# Patient Record
Sex: Male | Born: 1940
Health system: Southern US, Community
[De-identification: ages and names within clinical notes are randomized; demographics above are authoritative.]

## PROBLEM LIST (undated history)

## (undated) DIAGNOSIS — I10 Essential (primary) hypertension: Secondary | ICD-10-CM

## (undated) DIAGNOSIS — E785 Hyperlipidemia, unspecified: Secondary | ICD-10-CM

## (undated) DIAGNOSIS — Z7739 Contact with and (suspected) exposure to other war theater: Secondary | ICD-10-CM

## (undated) DIAGNOSIS — G4730 Sleep apnea, unspecified: Secondary | ICD-10-CM

## (undated) DIAGNOSIS — G709 Myoneural disorder, unspecified: Secondary | ICD-10-CM

## (undated) DIAGNOSIS — N400 Enlarged prostate without lower urinary tract symptoms: Secondary | ICD-10-CM

## (undated) DIAGNOSIS — K5730 Diverticulosis of large intestine without perforation or abscess without bleeding: Secondary | ICD-10-CM

## (undated) DIAGNOSIS — E119 Type 2 diabetes mellitus without complications: Secondary | ICD-10-CM

## (undated) DIAGNOSIS — Z77098 Contact with and (suspected) exposure to other hazardous, chiefly nonmedicinal, chemicals: Secondary | ICD-10-CM

## (undated) DIAGNOSIS — H269 Unspecified cataract: Secondary | ICD-10-CM

## (undated) DIAGNOSIS — M1990 Unspecified osteoarthritis, unspecified site: Secondary | ICD-10-CM

## (undated) HISTORY — DX: Contact with and (suspected) exposure to other war theater: Z77.39

## (undated) HISTORY — DX: Essential (primary) hypertension: I10

## (undated) HISTORY — PX: TOOTH EXTRACTION: SUR596

## (undated) HISTORY — DX: Unspecified osteoarthritis, unspecified site: M19.90

## (undated) HISTORY — DX: Benign prostatic hyperplasia without lower urinary tract symptoms: N40.0

## (undated) HISTORY — DX: Hyperlipidemia, unspecified: E78.5

## (undated) HISTORY — DX: Diverticulosis of large intestine without perforation or abscess without bleeding: K57.30

## (undated) HISTORY — DX: Contact with and (suspected) exposure to other hazardous, chiefly nonmedicinal, chemicals: Z77.098

## (undated) HISTORY — DX: Sleep apnea, unspecified: G47.30

## (undated) HISTORY — DX: Unspecified cataract: H26.9

## (undated) HISTORY — DX: Type 2 diabetes mellitus without complications: E11.9

## (undated) HISTORY — DX: Myoneural disorder, unspecified: G70.9

---

## 2004-05-03 LAB — HM COLONOSCOPY: HM Colonoscopy: ABNORMAL

## 2004-05-23 ENCOUNTER — Ambulatory Visit: Payer: Self-pay | Admitting: Internal Medicine

## 2004-09-09 ENCOUNTER — Ambulatory Visit: Payer: Self-pay | Admitting: Internal Medicine

## 2004-11-21 ENCOUNTER — Ambulatory Visit: Payer: Self-pay | Admitting: Internal Medicine

## 2004-11-21 LAB — CONVERTED CEMR LAB: PSA: 0.57 ng/mL

## 2005-05-24 ENCOUNTER — Ambulatory Visit: Payer: Self-pay | Admitting: Internal Medicine

## 2005-11-21 ENCOUNTER — Ambulatory Visit: Payer: Self-pay | Admitting: Internal Medicine

## 2006-05-25 ENCOUNTER — Ambulatory Visit: Payer: Self-pay | Admitting: Internal Medicine

## 2006-05-25 LAB — CONVERTED CEMR LAB
ALT: 49 units/L — ABNORMAL HIGH (ref 0–40)
Albumin: 3.9 g/dL (ref 3.5–5.2)
BUN: 10 mg/dL (ref 6–23)
CO2: 32 meq/L (ref 19–32)
Calcium: 9.2 mg/dL (ref 8.4–10.5)
Chloride: 102 meq/L (ref 96–112)
Cholesterol: 138 mg/dL (ref 0–200)
Creatinine, Ser: 0.9 mg/dL (ref 0.4–1.5)
Direct LDL: 74.4 mg/dL
GFR calc Af Amer: 109 mL/min
GFR calc non Af Amer: 90 mL/min
Glucose, Bld: 201 mg/dL — ABNORMAL HIGH (ref 70–99)
HDL: 31.5 mg/dL — ABNORMAL LOW (ref >39.0)
Hgb A1c MFr Bld: 9 % — ABNORMAL HIGH (ref 4.6–6.0)
Phosphorus: 3.7 mg/dL (ref 2.3–4.6)
Potassium: 4.4 meq/L (ref 3.5–5.1)
Sodium: 140 meq/L (ref 135–145)
Total CHOL/HDL Ratio: 4.4
Triglycerides: 208 mg/dL (ref 0–149)
VLDL: 42 mg/dL — ABNORMAL HIGH (ref 0–40)

## 2006-06-28 ENCOUNTER — Ambulatory Visit: Payer: Self-pay | Admitting: Internal Medicine

## 2006-09-17 ENCOUNTER — Encounter: Payer: Self-pay | Admitting: Internal Medicine

## 2006-09-17 DIAGNOSIS — K5730 Diverticulosis of large intestine without perforation or abscess without bleeding: Secondary | ICD-10-CM | POA: Insufficient documentation

## 2006-09-17 DIAGNOSIS — E785 Hyperlipidemia, unspecified: Secondary | ICD-10-CM | POA: Insufficient documentation

## 2006-09-17 DIAGNOSIS — H409 Unspecified glaucoma: Secondary | ICD-10-CM | POA: Insufficient documentation

## 2006-09-17 DIAGNOSIS — J309 Allergic rhinitis, unspecified: Secondary | ICD-10-CM | POA: Insufficient documentation

## 2006-09-20 ENCOUNTER — Ambulatory Visit: Payer: Self-pay | Admitting: Internal Medicine

## 2006-09-20 DIAGNOSIS — M159 Polyosteoarthritis, unspecified: Secondary | ICD-10-CM | POA: Insufficient documentation

## 2006-09-20 LAB — CONVERTED CEMR LAB: Hgb A1c MFr Bld: 6.6 % — ABNORMAL HIGH (ref 4.6–6.0)

## 2007-01-23 ENCOUNTER — Ambulatory Visit: Payer: Self-pay | Admitting: Internal Medicine

## 2007-01-23 LAB — CONVERTED CEMR LAB
Bilirubin Urine: NEGATIVE
Blood in Urine, dipstick: NEGATIVE
Glucose, Urine, Semiquant: NEGATIVE
Ketones, urine, test strip: NEGATIVE
Nitrite: NEGATIVE
Protein, U semiquant: NEGATIVE
Specific Gravity, Urine: <1.005
Urobilinogen, UA: 0.2
WBC Urine, dipstick: NEGATIVE
pH: 5

## 2007-02-12 ENCOUNTER — Telehealth (INDEPENDENT_AMBULATORY_CARE_PROVIDER_SITE_OTHER): Payer: Self-pay | Admitting: *Deleted

## 2007-03-10 ENCOUNTER — Encounter: Payer: Self-pay | Admitting: Internal Medicine

## 2007-03-21 ENCOUNTER — Ambulatory Visit: Payer: Self-pay | Admitting: Internal Medicine

## 2007-09-24 ENCOUNTER — Ambulatory Visit: Payer: Self-pay | Admitting: Internal Medicine

## 2007-09-25 LAB — CONVERTED CEMR LAB
ALT: 43 units/L (ref 0–53)
AST: 49 units/L — ABNORMAL HIGH (ref 0–37)
Albumin: 4 g/dL (ref 3.5–5.2)
Alkaline Phosphatase: 31 units/L — ABNORMAL LOW (ref 39–117)
BUN: 16 mg/dL (ref 6–23)
Basophils Absolute: 0 10*3/uL (ref 0.0–0.1)
Basophils Relative: 0.8 % (ref 0.0–1.0)
Bilirubin, Direct: 0.1 mg/dL (ref 0.0–0.3)
CO2: 30 meq/L (ref 19–32)
Calcium: 9.2 mg/dL (ref 8.4–10.5)
Chloride: 104 meq/L (ref 96–112)
Cholesterol: 141 mg/dL (ref 0–200)
Creatinine, Ser: 1.1 mg/dL (ref 0.4–1.5)
Eosinophils Absolute: 0.2 10*3/uL (ref 0.0–0.7)
Eosinophils Relative: 4 % (ref 0.0–5.0)
GFR calc Af Amer: 86 mL/min
GFR calc non Af Amer: 71 mL/min
Glucose, Bld: 76 mg/dL (ref 70–99)
HCT: 42.2 % (ref 39.0–52.0)
HDL: 31.2 mg/dL — ABNORMAL LOW (ref >39.0)
Hemoglobin: 14.1 g/dL (ref 13.0–17.0)
Hgb A1c MFr Bld: 6.6 % — ABNORMAL HIGH (ref 4.6–6.0)
LDL Cholesterol: 78 mg/dL (ref 0–99)
Lymphocytes Relative: 50.1 % — ABNORMAL HIGH (ref 12.0–46.0)
MCHC: 33.3 g/dL (ref 30.0–36.0)
MCV: 86.9 fL (ref 78.0–100.0)
Monocytes Absolute: 0.2 10*3/uL (ref 0.1–1.0)
Monocytes Relative: 6.2 % (ref 3.0–12.0)
Neutro Abs: 1.5 10*3/uL (ref 1.4–7.7)
Neutrophils Relative %: 38.9 % — ABNORMAL LOW (ref 43.0–77.0)
Phosphorus: 3.5 mg/dL (ref 2.3–4.6)
Platelets: 202 10*3/uL (ref 150–400)
Potassium: 4.4 meq/L (ref 3.5–5.1)
RBC: 4.86 M/uL (ref 4.22–5.81)
RDW: 12.8 % (ref 11.5–14.6)
Sodium: 140 meq/L (ref 135–145)
TSH: 1.48 microintl units/mL (ref 0.35–5.50)
Total Bilirubin: 0.7 mg/dL (ref 0.3–1.2)
Total CHOL/HDL Ratio: 4.5
Total Protein: 7 g/dL (ref 6.0–8.3)
Triglycerides: 161 mg/dL — ABNORMAL HIGH (ref 0–149)
VLDL: 32 mg/dL (ref 0–40)
WBC: 3.8 10*3/uL — ABNORMAL LOW (ref 4.5–10.5)

## 2007-12-18 ENCOUNTER — Ambulatory Visit: Payer: Self-pay | Admitting: Family Medicine

## 2007-12-18 DIAGNOSIS — M5430 Sciatica, unspecified side: Secondary | ICD-10-CM | POA: Insufficient documentation

## 2008-02-25 ENCOUNTER — Encounter: Payer: Self-pay | Admitting: Internal Medicine

## 2008-04-08 ENCOUNTER — Ambulatory Visit: Payer: Self-pay | Admitting: Internal Medicine

## 2008-05-21 ENCOUNTER — Encounter (INDEPENDENT_AMBULATORY_CARE_PROVIDER_SITE_OTHER): Payer: Self-pay | Admitting: *Deleted

## 2008-10-13 ENCOUNTER — Ambulatory Visit: Payer: Self-pay | Admitting: Internal Medicine

## 2008-10-14 LAB — CONVERTED CEMR LAB
ALT: 48 units/L (ref 0–53)
AST: 58 units/L — ABNORMAL HIGH (ref 0–37)
Albumin: 4.1 g/dL (ref 3.5–5.2)
Alkaline Phosphatase: 26 units/L — ABNORMAL LOW (ref 39–117)
BUN: 18 mg/dL (ref 6–23)
Basophils Absolute: 0 10*3/uL (ref 0.0–0.1)
Basophils Relative: 1.2 % (ref 0.0–3.0)
Bilirubin, Direct: 0.1 mg/dL (ref 0.0–0.3)
CO2: 30 meq/L (ref 19–32)
Calcium: 8.9 mg/dL (ref 8.4–10.5)
Chloride: 102 meq/L (ref 96–112)
Cholesterol: 143 mg/dL (ref 0–200)
Creatinine, Ser: 1 mg/dL (ref 0.4–1.5)
Eosinophils Absolute: 0.2 10*3/uL (ref 0.0–0.7)
Eosinophils Relative: 4.9 % (ref 0.0–5.0)
Glucose, Bld: 64 mg/dL — ABNORMAL LOW (ref 70–99)
HCT: 40.7 % (ref 39.0–52.0)
HDL: 34.5 mg/dL — ABNORMAL LOW (ref >39.00)
Hemoglobin: 13.8 g/dL (ref 13.0–17.0)
Hgb A1c MFr Bld: 7 % — ABNORMAL HIGH (ref 4.6–6.5)
LDL Cholesterol: 71 mg/dL (ref 0–99)
Lymphocytes Relative: 54.2 % — ABNORMAL HIGH (ref 12.0–46.0)
Lymphs Abs: 2.1 10*3/uL (ref 0.7–4.0)
MCHC: 33.8 g/dL (ref 30.0–36.0)
MCV: 87 fL (ref 78.0–100.0)
Monocytes Absolute: 0.3 10*3/uL (ref 0.1–1.0)
Monocytes Relative: 9 % (ref 3.0–12.0)
Neutro Abs: 1.2 10*3/uL — ABNORMAL LOW (ref 1.4–7.7)
Neutrophils Relative %: 30.7 % — ABNORMAL LOW (ref 43.0–77.0)
Phosphorus: 3.7 mg/dL (ref 2.3–4.6)
Platelets: 196 10*3/uL (ref 150.0–400.0)
Potassium: 4.5 meq/L (ref 3.5–5.1)
RBC: 4.68 M/uL (ref 4.22–5.81)
RDW: 13.1 % (ref 11.5–14.6)
Sodium: 141 meq/L (ref 135–145)
TSH: 1.65 microintl units/mL (ref 0.35–5.50)
Total Bilirubin: 1 mg/dL (ref 0.3–1.2)
Total CHOL/HDL Ratio: 4
Total Protein: 7.2 g/dL (ref 6.0–8.3)
Triglycerides: 189 mg/dL — ABNORMAL HIGH (ref 0.0–149.0)
VLDL: 37.8 mg/dL (ref 0.0–40.0)
WBC: 3.8 10*3/uL — ABNORMAL LOW (ref 4.5–10.5)

## 2009-01-11 ENCOUNTER — Ambulatory Visit: Payer: Self-pay | Admitting: Internal Medicine

## 2009-01-11 ENCOUNTER — Telehealth: Payer: Self-pay | Admitting: Internal Medicine

## 2009-01-11 DIAGNOSIS — R519 Headache, unspecified: Secondary | ICD-10-CM | POA: Insufficient documentation

## 2009-01-11 DIAGNOSIS — R51 Headache: Secondary | ICD-10-CM | POA: Insufficient documentation

## 2009-03-02 ENCOUNTER — Encounter: Payer: Self-pay | Admitting: Internal Medicine

## 2009-05-03 ENCOUNTER — Ambulatory Visit: Payer: Self-pay | Admitting: Internal Medicine

## 2009-11-03 ENCOUNTER — Ambulatory Visit: Payer: Self-pay | Admitting: Internal Medicine

## 2009-11-04 LAB — CONVERTED CEMR LAB
ALT: 45 units/L (ref 0–53)
AST: 52 units/L — ABNORMAL HIGH (ref 0–37)
Albumin: 4.4 g/dL (ref 3.5–5.2)
Alkaline Phosphatase: 36 units/L — ABNORMAL LOW (ref 39–117)
BUN: 23 mg/dL (ref 6–23)
Basophils Absolute: 0 10*3/uL (ref 0.0–0.1)
Basophils Relative: 0.6 % (ref 0.0–3.0)
Bilirubin, Direct: 0.1 mg/dL (ref 0.0–0.3)
CO2: 28 meq/L (ref 19–32)
Calcium: 9.1 mg/dL (ref 8.4–10.5)
Chloride: 98 meq/L (ref 96–112)
Cholesterol: 153 mg/dL (ref 0–200)
Creatinine, Ser: 1.2 mg/dL (ref 0.4–1.5)
Direct LDL: 80.3 mg/dL
Eosinophils Absolute: 0.3 10*3/uL (ref 0.0–0.7)
Eosinophils Relative: 5.9 % — ABNORMAL HIGH (ref 0.0–5.0)
GFR calc non Af Amer: 75.29 mL/min (ref >60)
Glucose, Bld: 133 mg/dL — ABNORMAL HIGH (ref 70–99)
HCT: 43.3 % (ref 39.0–52.0)
HDL: 32.3 mg/dL — ABNORMAL LOW (ref >39.00)
Hemoglobin: 14.6 g/dL (ref 13.0–17.0)
Hgb A1c MFr Bld: 7.2 % — ABNORMAL HIGH (ref 4.6–6.5)
Lymphocytes Relative: 48.5 % — ABNORMAL HIGH (ref 12.0–46.0)
Lymphs Abs: 2.1 10*3/uL (ref 0.7–4.0)
MCHC: 33.7 g/dL (ref 30.0–36.0)
MCV: 87.3 fL (ref 78.0–100.0)
Monocytes Absolute: 0.3 10*3/uL (ref 0.1–1.0)
Monocytes Relative: 7.2 % (ref 3.0–12.0)
Neutro Abs: 1.6 10*3/uL (ref 1.4–7.7)
Neutrophils Relative %: 37.8 % — ABNORMAL LOW (ref 43.0–77.0)
PSA: 0.53 ng/mL (ref 0.10–4.00)
Phosphorus: 3.9 mg/dL (ref 2.3–4.6)
Platelets: 211 10*3/uL (ref 150.0–400.0)
Potassium: 5.3 meq/L — ABNORMAL HIGH (ref 3.5–5.1)
RBC: 4.97 M/uL (ref 4.22–5.81)
RDW: 14.1 % (ref 11.5–14.6)
Sodium: 137 meq/L (ref 135–145)
TSH: 2.15 microintl units/mL (ref 0.35–5.50)
Total Bilirubin: 0.6 mg/dL (ref 0.3–1.2)
Total CHOL/HDL Ratio: 5
Total Protein: 7.2 g/dL (ref 6.0–8.3)
Triglycerides: 242 mg/dL — ABNORMAL HIGH (ref 0.0–149.0)
VLDL: 48.4 mg/dL — ABNORMAL HIGH (ref 0.0–40.0)
WBC: 4.2 10*3/uL — ABNORMAL LOW (ref 4.5–10.5)

## 2010-01-12 ENCOUNTER — Encounter: Payer: Self-pay | Admitting: Internal Medicine

## 2010-01-12 LAB — HM DIABETES EYE EXAM

## 2010-03-28 ENCOUNTER — Ambulatory Visit: Payer: Self-pay | Admitting: Family Medicine

## 2010-04-26 LAB — CONVERTED CEMR LAB: Hgb A1c MFr Bld: 7.3 %

## 2010-04-27 ENCOUNTER — Encounter: Payer: Self-pay | Admitting: Internal Medicine

## 2010-05-06 ENCOUNTER — Other Ambulatory Visit: Payer: Self-pay | Admitting: Internal Medicine

## 2010-05-06 ENCOUNTER — Ambulatory Visit
Admission: RE | Admit: 2010-05-06 | Discharge: 2010-05-06 | Payer: Self-pay | Source: Home / Self Care | Attending: Internal Medicine | Admitting: Internal Medicine

## 2010-05-06 LAB — HEPATIC FUNCTION PANEL
ALT: 50 U/L (ref 0–53)
AST: 52 U/L — ABNORMAL HIGH (ref 0–37)
Albumin: 4.3 g/dL (ref 3.5–5.2)
Alkaline Phosphatase: 36 U/L — ABNORMAL LOW (ref 39–117)
Bilirubin, Direct: 0.1 mg/dL (ref 0.0–0.3)
Total Bilirubin: 0.7 mg/dL (ref 0.3–1.2)
Total Protein: 7.2 g/dL (ref 6.0–8.3)

## 2010-05-06 LAB — LIPID PANEL
Cholesterol: 198 mg/dL (ref 0–200)
HDL: 35.1 mg/dL — ABNORMAL LOW (ref >39.00)
Total CHOL/HDL Ratio: 6
Triglycerides: 252 mg/dL — ABNORMAL HIGH (ref 0.0–149.0)
VLDL: 50.4 mg/dL — ABNORMAL HIGH (ref 0.0–40.0)

## 2010-05-06 LAB — LDL CHOLESTEROL, DIRECT: Direct LDL: 121.2 mg/dL

## 2010-05-06 LAB — HM DIABETES FOOT EXAM

## 2010-05-10 NOTE — Assessment & Plan Note (Signed)
Summary: L LEG PAIN/CLE   Vital Signs:  Patient Profile:   70 Years Old Male Weight:      235.38 pounds (106.99 kg) Temp:     98.4 degrees F (36.89 degrees C) oral Pulse rate:   88 / minute Pulse rhythm:   regular BP sitting:   120 / 90  (left arm) Cuff size:   regular  Vitals Entered By: Silas Sacramento (December 18, 2007 2:22 PM)                 Chief Complaint:  Left leg pain.  History of Present Illness: the patient is a very pleasant 70 year old African American gentleman who presents with a long history of left-sided radicular pain on the left side of his gluteal region radiating down the posterior aspect of his leg to the knee.He has tried some anti-inflammatories without significant success. He is currently doing Pilates and lift weights one time weekly. He denies any trauma. Denies any incontinence or saddle anesthesia.  Patient of Dr. Alphonsus Sias, now with pain at night in the posterior aspect of L leg. Progressively has gotten worse. Bothers most when sleeping on the stomache. Initially was in both legs, now only in the l side. Lost weight. Lost about 20 pounds and walking 5 days a week.   BP and sugars doing well.       Current Allergies: No known allergies   Past Medical History:    Reviewed history from 09/20/2006 and no changes required:       Allergic rhinitis       Diabetes mellitus, type II       Diverticulosis, colon       Hyperlipidemia       Hypertension       Glaucoma       Osteoarthritis  Past Surgical History:    Reviewed history from 09/17/2006 and no changes required:       [place] vet- 20% disability from shrapnel wounds 1967 and  20% agent orange       Stress test negative 01/08   Social History:    Reviewed history from 09/17/2006 and no changes required:       Marital Status: Married       Children: Widowed 1996, remarried 06/03       Occupation: Retired [employer]       Never Smoked       Rare alcohol    Review of Systems  General  Denies chills, fever, loss of appetite, and weight loss.      the patient has been attempting to lose weight, and is lost 20 pounds  MS      Complains of joint pain and stiffness.      Denies joint redness, joint swelling, loss of strength, low back pain, mid back pain, muscle aches, muscle, and muscle weakness.  Neuro      Denies falling down, numbness, poor balance, and tingling.   Physical Exam  General:     Well-developed,well-nourished,in no acute distress; alert,appropriate and cooperative throughout examination Head:     normocephalic and atraumatic.   Ears:     no external deformities.   Nose:     no external deformity.   Lungs:     normal respiratory effort.     Detailed Back/Spine Exam  Lumbosacral Exam:  Inspection-deformity:    Normal Palpation-spinal tenderness:  Normal Range of Motion:    Forward Flexion:   60 degrees    Hyperextension:   25  degrees    Schober's:        >6 cm    Right Lateral Bend:   25 degrees    Left Lateral Bend:   25 degrees Squatting:  normal Lying Straight Leg Raise:    Right:  negative    Left:  negative Sitting Straight Leg Raise:    Right:  negative    Left:  negative Reverse Straight Leg Raise:    Right:  negative    Left:  negative Contralateral Straight Leg Raise:    Right:  negative    Left:  negative Sciatic Notch:    There is no sciatic notch tenderness. Toe Walking:    Right:  normal    Left:  normal Heel Walking:    Right:  normal    Left:  normal Patrick's Maneuver:    Right:  negative    Left:  negative Fabere Test:    Right:  negative    Left:  negative     reverse Patrick's test on the left and right cause some low back discomfort. Nontender directly on his performance. Negative logroll Nontender greater trochanters Excellent range of motion bilateral hips, internal, external, abduction    Impression & Recommendations:  Problem # 1:  SCIATICA, LEFT (ICD-724.3) Assessment: New The patient  was given a handout from Dr. Ailene Ards book "The Sports Medicine Patient Advisor" describing the anatomy and rehabilitation of the following condition: Sciatica  >25 minutes spent in total face to face time with the patient with >50% of time spent in counselling and coordination of care and review of rehab  clinically not consistent with acute discogenic origin  increase pelvic ROM Core stabilization  Cont Pilates encouraged weight loss  NSAIDS prn  His updated medication list for this problem includes:    Aspirin 81 Mg Tabs (Aspirin) .Marland Kitchen... Take one by mouth daily   Complete Medication List: 1)  Metformin Hcl 1000 Mg Tabs (Metformin hcl) .... Take 1 tablet by mouth twice a day 2)  Aspirin 81 Mg Tabs (Aspirin) .... Take one by mouth daily 3)  Multivitamins Tabs (Multiple vitamin) .... Take one by mouth daily 4)  Lisinopril 20 Mg Tabs (Lisinopril) .... Take one by mouth daily 5)  Simvastatin 10 Mg Tabs (Simvastatin) .... Take one by mouth daily 6)  Travatan 0.004 % Soln (Travoprost) .... Use one drop in left daily 7)  Claritin Tabs (Loratadine tabs) .... Prn 8)  Glipizide Xl 5 Mg Tb24 (Glipizide) .... Take one by mouth daily 9)  Levitra 20 Mg Tabs (Vardenafil hcl) .... 1/2 -1 before sex as directed    ]

## 2010-05-10 NOTE — Assessment & Plan Note (Signed)
Summary: pain in head/ds   Vital Signs:  Patient profile:   70 year old male Weight:      245 pounds Temp:     98.3 degrees F oral Pulse rate:   76 / minute Pulse rhythm:   regular BP sitting:   120 / 60  (left arm) Cuff size:   regular  Vitals Entered By: Mervin Hack CMA Duncan Dull) (January 11, 2009 5:14 PM)  History of Present Illness: Chief Complaint: pain in head  Yesterday, in bed with wife had taken the levitra 2 nights ago Had taken shower before started wtih sweat and had pain across top of head--severe enough to scare him (during sex) pain occurred at climax  No vision change--no diplopia or unilateral loss No loss of hearing  No chest pain  Sharp pain that seemed to move across his forehead better when he just was lying still--after about 5 minutes checked BP--130/78  Noticed recurrence of pain today Was helping to push  a stuck car off the road    Allergies: No Known Drug Allergies  Past History:  Past medical, surgical, family and social histories (including risk factors) reviewed for relevance to current acute and chronic problems.  Past Medical History: Reviewed history from 10/13/2008 and no changes required. Allergic rhinitis Diabetes mellitus, type II Diverticulosis, colon Hyperlipidemia Hypertension Glaucoma Osteoarthritis  Dr Roxana Hires  Past Surgical History: Reviewed history from 09/17/2006 and no changes required. [place] vet- 20% disability from shrapnel wounds 1967 and  20% agent orange Stress test negative 01/08  Family History: Reviewed history from 09/17/2006 and no changes required. Father: Died age age 67, colon cancer Mother: Died at age 45, CVA, MI Siblings: 9 HTN- several sibs DM- several sibs CAD-- mom, brother  Social History: Reviewed history from 09/17/2006 and no changes required. Marital Status: Married Children: Widowed 1996, remarried 06/03 Occupation: Retired [employer] Never Smoked Rare alcohol  Review  of Systems       No arm or leg weakness No speech or swallowing problems  Physical Exam  General:  alert and normal appearance.   Head:  normocephalic and atraumatic.   No temporal bruits No tenderness Eyes:  pupils equal, pupils round, pupils reactive to light, and no optic disk abnormalities.   Mouth:  no erythema and no lesions.   Neck:  supple, no masses, no thyromegaly, no carotid bruits, and no cervical lymphadenopathy.   Lungs:  normal respiratory effort and normal breath sounds.   Heart:  normal rate, regular rhythm, no murmur, and no gallop.   Neurologic:  alert & oriented X3, cranial nerves II-XII intact, strength normal in all extremities, gait normal, finger-to-nose normal, and Romberg negative.   Psych:  normally interactive, good eye contact, not anxious appearing, and not depressed appearing.     Impression & Recommendations:  Problem # 1:  HEADACHE (ICD-784.0) Assessment New fairly classic description of coital headache first episode doesn't seem related to levitra No neuro findings  P: reassured that this is not something serious    can try sex again but call if recurs    may want to consider headache referral for recurrrence---I don't see this commonly, esp at his age    okay to try levitra again if no problems without  Complete Medication List: 1)  Metformin Hcl 1000 Mg Tabs (Metformin hcl) .... Take 1 tablet by mouth twice a day 2)  Aspirin 81 Mg Tabs (Aspirin) .... Take one by mouth daily 3)  Multivitamins Tabs (Multiple vitamin) .Marland KitchenMarland KitchenMarland Kitchen  Take one by mouth daily 4)  Lisinopril 20 Mg Tabs (Lisinopril) .... Take one by mouth daily 5)  Simvastatin 10 Mg Tabs (Simvastatin) .... Take one by mouth daily 6)  Travatan 0.004 % Soln (Travoprost) .... Use one drop in left daily 7)  Glipizide Xl 5 Mg Tb24 (Glipizide) .... Take one by mouth daily 8)  Levitra 20 Mg Tabs (Vardenafil hcl) .... 1/2 -1 before sex as directed 9)  Flonase 50 Mcg/act Susp (Fluticasone  propionate) .Marland Kitchen.. 1 spray in each nostril at bedtime 10)  Loratadine 10 Mg Tabs (Loratadine) .Marland Kitchen.. 1-2 tabs daily for allergy symptoms  Patient Instructions: 1)  Keep regular appointment  Current Allergies (reviewed today): No known allergies

## 2010-05-10 NOTE — Assessment & Plan Note (Signed)
Summary: FOLLOW UP/EVE   Vital Signs:  Patient Profile:   70 Years Old Male Weight:      242.38 pounds Temp:     99.4 degrees F oral Pulse rate:   80 / minute BP sitting:   108 / 56  (left arm) Cuff size:   large  Vitals Entered By: Wandra Mannan (September 20, 2006 10:18 AM)               Chief Complaint:  3 mo fu.  History of Present Illness: Has been walking 5 days/week---1---1 1/4 hours plus has had to mow lawn more in the spring Watching his diet Weight down 8# more Feels he can maintain this regimen  Sugars have been very good--checks in AM and does see relationship with evening meal and his level.  Generally have been <120  Does note inflammation in R hand joints lately--since being on glipizide Does use computer a lot and uses right hand. Also grips hand control on mower  Recent trip to eye doctor L eye pressure is much better  Current Allergies: No known allergies   Past Medical History:    Reviewed history from 09/17/2006 and no changes required:       Allergic rhinitis       Diabetes mellitus, type II       Diverticulosis, colon       Hyperlipidemia       Hypertension       Glaucoma       Osteoarthritis      Physical Exam  General:     alert and normal appearance.   Msk:     mild swelling of PIP in R 2nd-4th fingers    Impression & Recommendations:  Problem # 1:  DIABETES MELLITUS, TYPE II (ICD-250.00) Assessment: Improved Control seems better with lifestyle changes P: check HgbA1c  His updated medication list for this problem includes:    Metformin Hcl 1000 Mg Tabs (Metformin hcl) .Marland Kitchen... Take 1 tablet by mouth twice a day    Aspirin 81 Mg Tabs (Aspirin) .Marland Kitchen... Take one by mouth daily    Lisinopril 20 Mg Tabs (Lisinopril) .Marland Kitchen... Take one by mouth daily    Glipizide Xl 5 Mg Tb24 (Glipizide) .Marland Kitchen... Take one by mouth daily  Orders: TLB-A1C / Hgb A1C (Glycohemoglobin) (83036-A1C)   Problem # 2:  OSTEOARTHRITIS (ICD-715.90) Assessment:  Deteriorated Notes it more in R finger discussed the use of mouse and grip handles for mower Ibuprofen before mowing  His updated medication list for this problem includes:    Aspirin 81 Mg Tabs (Aspirin) .Marland Kitchen... Take one by mouth daily    Patient Instructions: 1)  Schedule physical in 6 months    Current Allergies: No known allergies  Current Medications (including changes made in today's visit):  METFORMIN HCL 1000 MG TABS (METFORMIN HCL) Take 1 tablet by mouth twice a day ASPIRIN 81 MG TABS (ASPIRIN) Take one by mouth daily MULTIVITAMINS  TABS (MULTIPLE VITAMIN) Take one by mouth daily LISINOPRIL 20 MG TABS (LISINOPRIL) Take one by mouth daily SIMVASTATIN 10 MG TABS (SIMVASTATIN) Take one by mouth daily TRAVATAN 0.004 % SOLN (TRAVOPROST) Use one drop in left daily CLARITIN  TABS (LORATADINE TABS) prn GLIPIZIDE XL 5 MG TB24 (GLIPIZIDE) Take one by mouth daily

## 2010-05-10 NOTE — Assessment & Plan Note (Signed)
Summary: 8:15 APPT 6 MONTH FOLLOW UP/[facility]   Vital Signs:  Patient profile:   70 year old male Height:      70 inches Weight:      242 pounds BMI:     34.85 Temp:     97.8 degrees F oral Pulse rate:   70 / minute Pulse rhythm:   regular BP sitting:   120 / 70  (left arm) Cuff size:   regular  Vitals Entered By: Mervin Hack CMA (October 13, 2008 10:47 AM)  History of Present Illness: Chief Complaint: 6 month follow-up  Doing fine No new problems Sciatica is better Able to walk more lately  AM sugars in 130's No hypoglycemic reactions---can feel it if he skips meals but nothing too much May get a bit drowsy if he eats high glycemic foods  Did feel a knot in his scrotum--not on the testicle  First noted it last week and seems intermittent No pain  No chest pain No SOB No ankle edema no dizziness No headaches  Allergies: No Known Drug Allergies  Past History:  Past medical, surgical, family and social histories (including risk factors) reviewed for relevance to current acute and chronic problems.  Past Medical History: Allergic rhinitis Diabetes mellitus, type II Diverticulosis, colon Hyperlipidemia Hypertension Glaucoma Osteoarthritis  Dr Roxana Hires  Past Surgical History: Reviewed history from 09/17/2006 and no changes required. [place] vet- 20% disability from shrapnel wounds 1967 and  20% agent orange Stress test negative 01/08  Family History: Reviewed history from 09/17/2006 and no changes required. Father: Died age age 13, colon cancer Mother: Died at age 97, CVA, MI Siblings: 9 HTN- several sibs DM- several sibs CAD-- mom, brother  Social History: Reviewed history from 09/17/2006 and no changes required. Marital Status: Married Children: Widowed 1996, remarried 06/03 Occupation: Retired [employer] Never Smoked Rare alcohol  Review of Systems       Now using hearing aid some help with this Now using flonase at night----from ENT weight  is stable Sleeps okay--nocturia in early AM  (5:30AM or so) No sig arthritic problems  Physical Exam  General:  alert and normal appearance.   Neck:  supple, no masses, no thyromegaly, no carotid bruits, and no cervical lymphadenopathy.   Lungs:  normal respiratory effort and normal breath sounds.   Heart:  normal rate, regular rhythm, no murmur, and no gallop.   Abdomen:  soft and non-tender.   Msk:  no joint tenderness and no joint swelling.   Pulses:  1+ in each foot Extremities:  no edema Neurologic:  alert & oriented X3 and strength normal in all extremities.   Skin:  no rashes and no suspicious lesions.   Psych:  normally interactive, good eye contact, not anxious appearing, and not depressed appearing.    Diabetes Management Exam:    Foot Exam (with socks and/or shoes not present):       Sensory-Pinprick/Light touch:          Left medial foot (L-4): normal          Left dorsal foot (L-5): normal          Left lateral foot (S-1): normal          Right medial foot (L-4): normal          Right dorsal foot (L-5): normal          Right lateral foot (S-1): normal       Inspection:  Left foot: abnormal             Comments: slight callous          Right foot: abnormal             Comments: slight callous       Nails:          Left foot: normal          Right foot: normal    Eye Exam:       Eye Exam done elsewhere          Date: 09/08/2008          Results: normal          Done by: Dr Emily Filbert   Impression & Recommendations:  Problem # 1:  DIABETES MELLITUS, TYPE II (ICD-250.00)  last HgbA1c 6.4% will recheck labs  His updated medication list for this problem includes:    Metformin Hcl 1000 Mg Tabs (Metformin hcl) .Marland Kitchen... Take 1 tablet by mouth twice a day    Aspirin 81 Mg Tabs (Aspirin) .Marland Kitchen... Take one by mouth daily    Lisinopril 20 Mg Tabs (Lisinopril) .Marland Kitchen... Take one by mouth daily    Glipizide Xl 5 Mg Tb24 (Glipizide) .Marland Kitchen... Take one by mouth  daily  Orders: TLB-A1C / Hgb A1C (Glycohemoglobin) (83036-A1C)  Problem # 2:  HYPERTENSION (ICD-401.9) Assessment: Unchanged  good control urine microal negative at [place] last time  His updated medication list for this problem includes:    Lisinopril 20 Mg Tabs (Lisinopril) .Marland Kitchen... Take one by mouth daily  BP today: 120/70 Prior BP: 110/60 (04/08/2008)  Labs Reviewed: K+: 4.4 (09/24/2007) Creat: : 1.1 (09/24/2007)   Chol: 141 (09/24/2007)   HDL: 31.2 (09/24/2007)   LDL: 78 (09/24/2007)   TG: 161 (09/24/2007)  Orders: TLB-Renal Function Panel (80069-RENAL) TLB-CBC Platelet - w/Differential (85025-CBCD) TLB-TSH (Thyroid Stimulating Hormone) (84443-TSH)  Problem # 3:  OSTEOARTHRITIS (ICD-715.90) sciatica better more active again  Problem # 4:  HYPERLIPIDEMIA (ICD-272.4) Assessment: Unchanged  no problems with med due for labs  His updated medication list for this problem includes:    Simvastatin 10 Mg Tabs (Simvastatin) .Marland Kitchen... Take one by mouth daily  Labs Reviewed: SGOT: 49 (09/24/2007)   SGPT: 43 (09/24/2007)   HDL:31.2 (09/24/2007), 31.5 (05/25/2006)  LDL:78 (09/24/2007), DEL (54/12/8117)  Chol:141 (09/24/2007), 138 (05/25/2006)  Trig:161 (09/24/2007), 208 (05/25/2006)  Orders: TLB-Lipid Panel (80061-LIPID) TLB-Hepatic/Liver Function Pnl (80076-HEPATIC) Venipuncture (14782)  Problem # 5:  ALLERGIC RHINITIS (ICD-477.9) Assessment: Unchanged okay on current regimen  The following medications were removed from the medication list:    Claritin Tabs (Loratadine tabs) .Marland Kitchen... Prn His updated medication list for this problem includes:    Flonase 50 Mcg/act Susp (Fluticasone propionate) .Marland Kitchen... 1 spray in each nostril at bedtime    Loratadine 10 Mg Tabs (Loratadine) .Marland Kitchen... 1-2 tabs daily for allergy symptoms  Complete Medication List: 1)  Metformin Hcl 1000 Mg Tabs (Metformin hcl) .... Take 1 tablet by mouth twice a day 2)  Aspirin 81 Mg Tabs (Aspirin) .... Take one by mouth  daily 3)  Multivitamins Tabs (Multiple vitamin) .... Take one by mouth daily 4)  Lisinopril 20 Mg Tabs (Lisinopril) .... Take one by mouth daily 5)  Simvastatin 10 Mg Tabs (Simvastatin) .... Take one by mouth daily 6)  Travatan 0.004 % Soln (Travoprost) .... Use one drop in left daily 7)  Glipizide Xl 5 Mg Tb24 (Glipizide) .... Take one by mouth daily 8)  Levitra 20 Mg Tabs (  Vardenafil hcl) .... 1/2 -1 before sex as directed 9)  Flonase 50 Mcg/act Susp (Fluticasone propionate) .Marland Kitchen.. 1 spray in each nostril at bedtime 10)  Loratadine 10 Mg Tabs (Loratadine) .Marland Kitchen.. 1-2 tabs daily for allergy symptoms  Patient Instructions: 1)  Please schedule a follow-up appointment in 6 months .   Current Allergies (reviewed today): No known allergies

## 2010-05-10 NOTE — Letter (Signed)
Summary: Primary Care Appointment Letter  [facility]  [address] [place], [place] [address]   Phone: [phone]  Fax: [phone]    05/21/2008 MRN: [id number]  Lucas [name] [address] [place], [place]  [address]  Dear Lucas [name],   Your Primary Care Physician Cindee Salt MD has indicated that:    _______it is time to schedule an appointment.    _______you missed your appointment on______ and need to call and          reschedule.    _______you need to have lab work done.    _______you need to schedule an appointment discuss lab or test results.    _______you need to call to reschedule your appointment that is                       scheduled on _________.     Please call our office as soon as possible. Our phone number is [phone]          _________. Please press option 1. Our office is open 8a-12noon and 1p-5p, Monday through Friday.     Thank you,    [facility]

## 2010-05-10 NOTE — Assessment & Plan Note (Signed)
Summary: CK UP/HEA   Vital Signs:  Patient Profile:   70 Years Old Male Weight:      245.38 pounds Temp:     98.2 degrees F oral Pulse rate:   76 / minute BP sitting:   118 / 66  (left arm) Cuff size:   large  Vitals Entered By: Wandra Mannan (March 21, 2007 7:53 AM)                 Chief Complaint:  check up  fasting.  History of Present Illness: Has had his follow up at the [place] [facility] work looked good HgbA1c 6.5% PSA 0.5 Chol levels at goal  Has been scheduled for AAA screening with ultrasound also Will be participating in a study about memory issues--he has not had problems though  Did have episode of sharp subcostal pain before eating that lasted a couple of hours Has happened once before about 1 year ago No SOB This happened after his morning walk and he had taken his glipizide  Sugars up a little for Thanksgiving, otherwise okay Did have eyes checked  No foot ulcers--[facility] doctor found slightly decreased sensation in left big toe--he wasn't aware  Current Allergies (reviewed today): No known allergies   Past Medical History:    Reviewed history from 09/20/2006 and no changes required:       Allergic rhinitis       Diabetes mellitus, type II       Diverticulosis, colon       Hyperlipidemia       Hypertension       Glaucoma       Osteoarthritis  Past Surgical History:    Reviewed history from 09/17/2006 and no changes required:       [place] vet- 20% disability from shrapnel wounds 1967 and  20% agent orange       Stress test negative 01/08   Family History:    Reviewed history from 09/17/2006 and no changes required:       Father: Died age age 51, colon cancer       Mother: Died at age 32, CVA, MI       Siblings: 9       HTN- several sibs       DM- several sibs       CAD-- mom, brother  Social History:    Reviewed history from 09/17/2006 and no changes required:       Marital Status: Married       Children: Widowed 1996, remarried 06/03  Occupation: Retired [employer]       Never Smoked       Rare alcohol    Review of Systems  The patient denies chest pain, syncope, dyspnea on exhertion, peripheral edema, abdominal pain, and depression.         Weight is stable Sleeps fairly well Has nocturia x 1    Physical Exam  General:     alert and normal appearance.   Neck:     supple, no masses, no thyromegaly, no carotid bruits, and no cervical lymphadenopathy.   Lungs:     normal respiratory effort and normal breath sounds.   Heart:     normal rate, regular rhythm, no murmur, and no gallop.   Pulses:     1+ in feet Extremities:     no edema Neurologic:     sensation intact to light touch.   Skin:     no ulcerations.   Psych:  normally interactive, good eye contact, not anxious appearing, and not depressed appearing.      Impression & Recommendations:  Problem # 1:  DIABETES MELLITUS, TYPE II (ICD-250.00) Assessment: Unchanged good control  His updated medication list for this problem includes:    Metformin Hcl 1000 Mg Tabs (Metformin hcl) .Marland Kitchen... Take 1 tablet by mouth twice a day    Aspirin 81 Mg Tabs (Aspirin) .Marland Kitchen... Take one by mouth daily    Lisinopril 20 Mg Tabs (Lisinopril) .Marland Kitchen... Take one by mouth daily    Glipizide Xl 5 Mg Tb24 (Glipizide) .Marland Kitchen... Take one by mouth daily   Problem # 2:  HYPERTENSION (ICD-401.9) Assessment: Unchanged at goal No changes needed  His updated medication list for this problem includes:    Lisinopril 20 Mg Tabs (Lisinopril) .Marland Kitchen... Take one by mouth daily   Problem # 3:  HYPERLIPIDEMIA (ICD-272.4) Assessment: Unchanged at goal on Rx  His updated medication list for this problem includes:    Simvastatin 10 Mg Tabs (Simvastatin) .Marland Kitchen... Take one by mouth daily   Problem # 4:  LOW BACK PAIN, ACUTE (ICD-724.2) Assessment: Improved this resolved  His updated medication list for this problem includes:    Aspirin 81 Mg Tabs (Aspirin) .Marland Kitchen... Take one by mouth  daily   Complete Medication List: 1)  Metformin Hcl 1000 Mg Tabs (Metformin hcl) .... Take 1 tablet by mouth twice a day 2)  Aspirin 81 Mg Tabs (Aspirin) .... Take one by mouth daily 3)  Multivitamins Tabs (Multiple vitamin) .... Take one by mouth daily 4)  Lisinopril 20 Mg Tabs (Lisinopril) .... Take one by mouth daily 5)  Simvastatin 10 Mg Tabs (Simvastatin) .... Take one by mouth daily 6)  Travatan 0.004 % Soln (Travoprost) .... Use one drop in left daily 7)  Claritin Tabs (Loratadine tabs) .... Prn 8)  Glipizide Xl 5 Mg Tb24 (Glipizide) .... Take one by mouth daily 9)  Viagra 100 Mg Tabs (Sildenafil citrate) .... As directed   Patient Instructions: 1)  Please schedule a follow-up appointment in 6 months.    ] Current Allergies (reviewed today): No known allergies  Current Medications (including changes made in today's visit):  METFORMIN HCL 1000 MG TABS (METFORMIN HCL) Take 1 tablet by mouth twice a day ASPIRIN 81 MG TABS (ASPIRIN) Take one by mouth daily MULTIVITAMINS  TABS (MULTIPLE VITAMIN) Take one by mouth daily LISINOPRIL 20 MG TABS (LISINOPRIL) Take one by mouth daily SIMVASTATIN 10 MG TABS (SIMVASTATIN) Take one by mouth daily TRAVATAN 0.004 % SOLN (TRAVOPROST) Use one drop in left daily CLARITIN  TABS (LORATADINE TABS) prn GLIPIZIDE XL 5 MG TB24 (GLIPIZIDE) Take one by mouth daily VIAGRA 100 MG  TABS (SILDENAFIL CITRATE) as directed   Tetanus/Td Immunization History:    Tetanus/Td # 1:  Td (04/12/1999)  Pneumovax Immunization History:    Pneumovax # 1:  Historical (04/10/2005)

## 2010-05-10 NOTE — Assessment & Plan Note (Signed)
Summary: 8:00 APPT 6 MONTH FOLLOW UP/[facility]   Vital Signs:  Patient profile:   70 year old male Weight:      236.38 pounds BMI:     34.04 Temp:     98.2 degrees F oral Pulse rate:   84 / minute Pulse rhythm:   regular BP sitting:   122 / 70  (left arm) Cuff size:   large  Vitals Entered By: Janee Morn CMA (November 03, 2009 8:03 AM) CC: 6 month f/u   History of Present Illness: DOing okay  Having some left hip pain--- affecting his sleep radiates to groin and knee sleeps with pillow under or between his legs Advil does help some (200mg ) Hurts with walking but then improves over time  No recent visits to [place] has repeat hearing test due soon  Checks AM sugars somewhat high but 99 today. Usually 120-150 No hypoglycemic reactions recent eye exam No pain or sores in feet  Some muscle aching though not severe Is concerned about this though---wants to consider trial off statin  No headache No chest pain No SOB  Allergies: No Known Drug Allergies  Past History:  Past medical, surgical, family and social histories (including risk factors) reviewed for relevance to current acute and chronic problems.  Past Medical History: Reviewed history from 10/13/2008 and no changes required. Allergic rhinitis Diabetes mellitus, type II Diverticulosis, colon Hyperlipidemia Hypertension Glaucoma Osteoarthritis  Dr Roxana Hires  Past Surgical History: Reviewed history from 09/17/2006 and no changes required. [place] vet- 20% disability from shrapnel wounds 1967 and  20% agent orange Stress test negative 01/08  Family History: Reviewed history from 09/17/2006 and no changes required. Father: Died age age 6, colon cancer Mother: Died at age 67, CVA, MI Siblings: 9 HTN- several sibs DM- several sibs CAD-- mom, brother  Social History: Reviewed history from 09/17/2006 and no changes required. Marital Status: Married Children: Widowed 1996, remarried 06/03 Occupation:  Retired [employer] Never Smoked Rare alcohol  Review of Systems       Eating okay weight down 6# sleeps okay other than the hip pain SOme urinary hesitation and frrequency  Physical Exam  General:  alert and normal appearance.   Neck:  supple, no masses, no thyromegaly, no carotid bruits, and no cervical lymphadenopathy.   Lungs:  normal respiratory effort, no intercostal retractions, no accessory muscle use, and normal breath sounds.   Heart:  normal rate, regular rhythm, no murmur, and no gallop.   Msk:  fairly normal ROM of right hip  no localized tenderness Pulses:  1+ in feet Extremities:  no edema Skin:  no suspicious lesions and no ulcerations.   Psych:  normally interactive, good eye contact, not anxious appearing, and not depressed appearing.    Diabetes Management Exam:    Foot Exam (with socks and/or shoes not present):       Sensory-Pinprick/Light touch:          Left medial foot (L-4): normal          Left dorsal foot (L-5): normal          Left lateral foot (S-1): normal          Right medial foot (L-4): normal          Right dorsal foot (L-5): normal          Right lateral foot (S-1): normal       Inspection:          Left foot: normal  Right foot: normal       Nails:          Left foot: normal          Right foot: normal    Eye Exam:       Eye Exam done elsewhere          Date: 09/27/2009          Results: no retinopathy          Done by: Ginette Otto ophtho   Impression & Recommendations:  Problem # 1:  DIABETES MELLITUS, TYPE II (ICD-250.00) Assessment Unchanged  still seems to have good control will check labs urine microal negative 6 months ago  His updated medication list for this problem includes:    Metformin Hcl 1000 Mg Tabs (Metformin hcl) .Marland Kitchen... Take 1 tablet by mouth twice a day    Aspirin 81 Mg Tabs (Aspirin) .Marland Kitchen... Take one by mouth daily    Lisinopril 20 Mg Tabs (Lisinopril) .Marland Kitchen... Take one by mouth daily    Glipizide Xl 5 Mg Tb24  (Glipizide) .Marland Kitchen... Take one by mouth daily  Labs Reviewed: Creat: 1.0 (10/13/2008)     Last Eye Exam: no retinopathy (09/27/2009) Reviewed HgBA1c results: 7.0 (10/13/2008)  6.6 (09/24/2007)  Orders: TLB-A1C / Hgb A1C (Glycohemoglobin) (83036-A1C)  Problem # 2:  HYPERTENSION (ICD-401.9) Assessment: Unchanged  good control no changes needed  His updated medication list for this problem includes:    Lisinopril 20 Mg Tabs (Lisinopril) .Marland Kitchen... Take one by mouth daily  BP today: 122/70 Prior BP: 110/68 (05/03/2009)  Labs Reviewed: K+: 4.5 (10/13/2008) Creat: : 1.0 (10/13/2008)   Chol: 143 (10/13/2008)   HDL: 34.50 (10/13/2008)   LDL: 71 (10/13/2008)   TG: 189.0 (10/13/2008)  Orders: TLB-Renal Function Panel (80069-RENAL) TLB-CBC Platelet - w/Differential (85025-CBCD) TLB-TSH (Thyroid Stimulating Hormone) (84443-TSH) Venipuncture (16109)  Problem # 3:  HYPERLIPIDEMIA (ICD-272.4) Assessment: Comment Only  concerned about myalgias If chol still low, will stop med and recheck labs the next time  His updated medication list for this problem includes:    Simvastatin 10 Mg Tabs (Simvastatin) .Marland Kitchen... Take one by mouth daily  Labs Reviewed: SGOT: 58 (10/13/2008)   SGPT: 48 (10/13/2008)   HDL:34.50 (10/13/2008), 31.2 (09/24/2007)  LDL:71 (10/13/2008), 78 (09/24/2007)  Chol:143 (10/13/2008), 141 (09/24/2007)  Trig:189.0 (10/13/2008), 161 (09/24/2007)  Orders: TLB-Lipid Panel (80061-LIPID) TLB-Hepatic/Liver Function Pnl (80076-HEPATIC)  Problem # 4:  OSTEOARTHRITIS (ICD-715.90) Assessment: Deteriorated more pain in hip---either hip or spine arthritis discussed increased fitness and ibuprofen at night  Complete Medication List: 1)  Metformin Hcl 1000 Mg Tabs (Metformin hcl) .... Take 1 tablet by mouth twice a day 2)  Aspirin 81 Mg Tabs (Aspirin) .... Take one by mouth daily 3)  Lisinopril 20 Mg Tabs (Lisinopril) .... Take one by mouth daily 4)  Simvastatin 10 Mg Tabs (Simvastatin)  .... Take one by mouth daily 5)  Glipizide Xl 5 Mg Tb24 (Glipizide) .... Take one by mouth daily 6)  Levitra 20 Mg Tabs (Vardenafil hcl) .... 1/2 -1 before sex as directed 7)  Flonase 50 Mcg/act Susp (Fluticasone propionate) .Marland Kitchen.. 1 spray in each nostril at bedtime 8)  Loratadine 10 Mg Tabs (Loratadine) .Marland Kitchen.. 1-2 tabs daily for allergy symptoms 9)  Travatan 0.004 % Soln (Travoprost) .... Use one drop in left daily 10)  Vitamin D3 1000 Unit Caps (Cholecalciferol) .... Take 1 by mouth once daily 11)  Multivitamins Tabs (Multiple vitamin) .... Take one by mouth daily  Other Orders: TLB-PSA (Prostate Specific Antigen) (  84153-PSA)  Patient Instructions: 1)  Please use ibuprofen 400-600mg  at bedtime to help hip pain 2)  Please schedule a follow-up appointment in 6 months .   Current Allergies (reviewed today): No known allergies

## 2010-05-10 NOTE — Assessment & Plan Note (Signed)
Summary: 6 month follow up/[facility]   Vital Signs:  Patient Profile:   70 Years Old Male Weight:      242 pounds Temp:     99 degrees F oral Pulse rate:   68 / minute Pulse rhythm:   irregular BP sitting:   110 / 60  (left arm) Cuff size:   regular  Vitals Entered By: Mervin Hack CMA (April 08, 2008 9:15 AM)               Vision Comments: 02/2009   Chief Complaint:  6 month follow-up.  History of Present Illness: Still with sciatica pain Notices mostly at night Generally better when he gets moving around Hasn't been as active--has put on about 5# Trying riding bike inside due to weather  Careful with eating till the past month Had blood work at [place] 2 weeks ago--he heasn't heard the results Gets [facility] at [facility]  Has had flu shot, H1N1 and shingles shot  Checks sugars Up in past week but generally around 120 fasting No hypoglycemic reactions Gets drowsy about 1 hour after eating carbs (like bread)  Gets linear pain sensation across mid chest-right and left usuallly in AM and first getting going No SOB No cough or breathing problems Fairly brief--usually gone fairly quickly but has sense of it for much of the day. Not tender     Current Allergies (reviewed today): No known allergies   Past Medical History:    Reviewed history from 09/20/2006 and no changes required:       Allergic rhinitis       Diabetes mellitus, type II       Diverticulosis, colon       Hyperlipidemia       Hypertension       Glaucoma       Osteoarthritis  Past Surgical History:    Reviewed history from 09/17/2006 and no changes required:       [place] vet- 20% disability from shrapnel wounds 1967 and  20% agent orange       Stress test negative 01/08   Social History:    Reviewed history from 09/17/2006 and no changes required:       Marital Status: Married       Children: Widowed 1996, remarried 06/03       Occupation: Retired [employer]       Never Smoked       Rare  alcohol    Review of Systems       sleeps okay--has had to adjust to having the pain in his leg Nocturia x 1 in general No sores, ulcers or pain in feet No sig mood issues   Physical Exam  General:     alert and normal appearance.   Neck:     supple, no masses, no thyromegaly, no carotid bruits, and no cervical lymphadenopathy.   Chest Wall:     no deformities, no tenderness, and no masses.   Lungs:     normal respiratory effort and normal breath sounds.   Heart:     normal rate, regular rhythm, no murmur, and no gallop.   Abdomen:     soft, non-tender, and no masses.   Pulses:     1+ pulses Extremities:     no edema Psych:     normally interactive, good eye contact, not anxious appearing, and not depressed appearing.    Diabetes Management Exam:    Foot Exam (with socks and/or shoes not present):  Sensory-Pinprick/Light touch:          Left medial foot (L-4): normal          Left dorsal foot (L-5): normal          Left lateral foot (S-1): normal          Right medial foot (L-4): normal          Right dorsal foot (L-5): normal          Right lateral foot (S-1): normal       Inspection:          Left foot: normal          Right foot: normal       Nails:          Left foot: normal          Right foot: normal    Eye Exam:       Eye Exam done elsewhere          Date: 02/09/2008          Results: normal          Done by: Ginette Otto eye doctor    Impression & Recommendations:  Problem # 1:  CHEST PAIN (ICD-786.50) Assessment: New very atypical EKG reassuring discussed ischemic type pain Orders: EKG w/ Interpretation (93000)   Problem # 2:  SCIATICA, LEFT (ICD-724.3) Assessment: Unchanged will try aleve at bedtime since that is when he really has symptoms  His updated medication list for this problem includes:    Aspirin 81 Mg Tabs (Aspirin) .Marland Kitchen... Take one by mouth daily   Problem # 3:  DIABETES MELLITUS, TYPE II (ICD-250.00) Assessment:  Unchanged will get labs from [place] needs to buckle down  His updated medication list for this problem includes:    Metformin Hcl 1000 Mg Tabs (Metformin hcl) .Marland Kitchen... Take 1 tablet by mouth twice a day    Aspirin 81 Mg Tabs (Aspirin) .Marland Kitchen... Take one by mouth daily    Lisinopril 20 Mg Tabs (Lisinopril) .Marland Kitchen... Take one by mouth daily    Glipizide Xl 5 Mg Tb24 (Glipizide) .Marland Kitchen... Take one by mouth daily  Labs Reviewed: HgBA1c: 6.6 (09/24/2007)   Creat: 1.1 (09/24/2007)     Last Eye Exam: normal (02/09/2008)   Problem # 4:  HYPERTENSION (ICD-401.9) Assessment: Unchanged doing well  no changes needed  His updated medication list for this problem includes:    Lisinopril 20 Mg Tabs (Lisinopril) .Marland Kitchen... Take one by mouth daily  BP today: 110/60 Prior BP: 120/90 (12/18/2007)  Labs Reviewed: Creat: 1.1 (09/24/2007) Chol: 141 (09/24/2007)   HDL: 31.2 (09/24/2007)   LDL: 78 (09/24/2007)   TG: 161 (09/24/2007)   Problem # 5:  HYPERLIPIDEMIA (ICD-272.4) Assessment: Unchanged [facility] His updated medication list for this problem includes:    Simvastatin 10 Mg Tabs (Simvastatin) .Marland Kitchen... Take one by mouth daily  Labs Reviewed: Chol: 141 (09/24/2007)   HDL: 31.2 (09/24/2007)   LDL: 78 (09/24/2007)   TG: 161 (09/24/2007) SGOT: 49 (09/24/2007)   SGPT: 43 (09/24/2007)   Complete Medication List: 1)  Metformin Hcl 1000 Mg Tabs (Metformin hcl) .... Take 1 tablet by mouth twice a day 2)  Aspirin 81 Mg Tabs (Aspirin) .... Take one by mouth daily 3)  Multivitamins Tabs (Multiple vitamin) .... Take one by mouth daily 4)  Lisinopril 20 Mg Tabs (Lisinopril) .... Take one by mouth daily 5)  Simvastatin 10 Mg Tabs (Simvastatin) .... Take one by mouth daily 6)  Travatan 0.004 % Soln (Travoprost) .... Use one drop in left daily 7)  Claritin Tabs (Loratadine tabs) .... Prn 8)  Glipizide Xl 5 Mg Tb24 (Glipizide) .... Take one by mouth daily 9)  Levitra 20 Mg Tabs (Vardenafil hcl) .... 1/2 -1 before sex  as directed   Patient Instructions: 1)  Please schedule a follow-up appointment in 6 months.   ] Current Allergies (reviewed today): No known allergies   Tetanus/Td Immunization History:    Tetanus/Td # 1:  Td (04/12/1999)  Pneumovax Immunization History:    Pneumovax # 1:  Historical (04/10/2005)  Other Immunization History:    Zostavax # 1 (03/02/2008)    EKG  Procedure date:  04/08/2008  Findings:      Sinus @ 80 Normal

## 2010-05-10 NOTE — Progress Notes (Signed)
Summary: request rx  Phone Note Call from Patient Call back at [phone]   Caller: Patient Call For: Dr. Alphonsus Sias Summary of Call: [facility] and is going out of town on Thursday for a few days.  Pt would like for you to call in enough of his Meformin 1000 mg  two times a day to last him for 7 days.  Pharmacy [facility].  Knows Dr. Alphonsus Sias is out this afternoon. Initial call taken by: Sydell Axon,  February 12, 2007 4:18 PM  Follow-up for Phone Call        okay # 30 x 1 Follow-up by: Cindee Salt MD,  February 12, 2007 9:02 PM  Additional Follow-up for Phone Call Additional follow up Details #1::        [facility] Additional Follow-up by: Wandra Mannan,  February 13, 2007 8:01 AM

## 2010-05-10 NOTE — Progress Notes (Signed)
Summary: Pain in head?  Phone Note Call from Patient   Caller: Patient Call For: Cindee Salt MD Summary of Call: Patient walked in complaining of pain in forehead and sweating after being in bed with his wife, 01/10/2009 after he rested the pain went away. Today he was pushing a car and the pain came back, the pain went away but now his head doesn't feel right. Patient is in lobby and wants to be seen, were should I put him? Please advise Initial call taken by: Mervin Hack CMA Duncan Dull),  January 11, 2009 3:14 PM  Follow-up for Phone Call        per Dr. Alphonsus Sias, adding him on to the schedule. Follow-up by: Mervin Hack CMA (AAMA),  January 11, 2009 3:28 PM

## 2010-05-10 NOTE — Assessment & Plan Note (Signed)
Summary: 6 MONTH FOLLOW UP/[facility]   Vital Signs:  Patient Profile:   70 Years Old Male Weight:      238.4 pounds Temp:     98.9 degrees F oral Pulse rate:   72 / minute Pulse rhythm:   regular BP sitting:   110 / 70  (left arm) Cuff size:   large  Vitals Entered By: Liane Comber (September 24, 2007 10:08 AM)                 Chief Complaint:  6 mo/ f/u.  History of Present Illness: Doing well Feels "better than I've felt in a long time" Has been trying to live healthier Weight is down 7#---motivated by threat of increased blood pressure meds At home, systolic 120's to 130 or so sugar as high as 150 but has been better lately Walks 5 days per week plus Pilates and weights trying to eat less and only when really hungry  No problems with cholesterol meds some allergies --loratadine helps  No sores, numbness or pain in feet Feet feel better with increased exercise does note cold feet in winter  Abd ultrasound was negative for AAA  Keeps up with diabetic eye exams at [facility]    Current Allergies (reviewed today): No known allergies   Past Medical History:    Reviewed history from 09/20/2006 and no changes required:       Allergic rhinitis       Diabetes mellitus, type II       Diverticulosis, colon       Hyperlipidemia       Hypertension       Glaucoma       Osteoarthritis  Past Surgical History:    Reviewed history from 09/17/2006 and no changes required:       [place] vet- 20% disability from shrapnel wounds 1967 and  20% agent orange       Stress test negative 01/08   Social History:    Reviewed history from 09/17/2006 and no changes required:       Marital Status: Married       Children: Widowed 1996, remarried 06/03       Occupation: Retired [employer]       Never Smoked       Rare alcohol    Review of Systems  The patient denies chest pain, syncope, and dyspnea on exertion.         Pain on inside of thighs after the second dose of levitra within 1  week---may have been muscular (not clearly from the drug) some change in bowels since diet and exercise better---less frequent but seems to clear more fully when he goes (may go more than once in a day after a skip)   Physical Exam  General:     alert and normal appearance.   Neck:     supple, no masses, no thyromegaly, no carotid bruits, and no cervical lymphadenopathy.   Lungs:     normal respiratory effort and normal breath sounds.   Heart:     normal rate, regular rhythm, no murmur, and no gallop.   Abdomen:     soft, non-tender, and no masses.   Msk:     no joint tenderness and no joint swelling.   Pulses:     2+ in feet Extremities:     no edema or calf tenderness Neurologic:     alert & oriented X3 and sensation intact to light touch.   Skin:  no rashes, no suspicious lesions, and no ulcerations.   Psych:     normally interactive, good eye contact, not anxious appearing, and not depressed appearing.      Impression & Recommendations:  Problem # 1:  DIABETES MELLITUS, TYPE II (ICD-250.00) Assessment: Unchanged seems to have good control will check labs  His updated medication list for this problem includes:    Metformin Hcl 1000 Mg Tabs (Metformin hcl) .Marland Kitchen... Take 1 tablet by mouth twice a day    Aspirin 81 Mg Tabs (Aspirin) .Marland Kitchen... Take one by mouth daily    Lisinopril 20 Mg Tabs (Lisinopril) .Marland Kitchen... Take one by mouth daily    Glipizide Xl 5 Mg Tb24 (Glipizide) .Marland Kitchen... Take one by mouth daily  Orders: TLB-A1C / Hgb A1C (Glycohemoglobin) (83036-A1C)   Problem # 2:  HYPERTENSION (ICD-401.9) Assessment: Unchanged good control now  His updated medication list for this problem includes:    Lisinopril 20 Mg Tabs (Lisinopril) .Marland Kitchen... Take one by mouth daily  BP today: 110/70 Prior BP: 118/66 (03/21/2007)  Labs Reviewed: Creat: 0.9 (05/25/2006) Chol: 138 (05/25/2006)   HDL: 31.5 (05/25/2006)   LDL: DEL (05/25/2006)   TG: 208 (05/25/2006)  Orders: TLB-Renal  Function Panel (80069-RENAL) TLB-CBC Platelet - w/Differential (85025-CBCD) TLB-TSH (Thyroid Stimulating Hormone) (84443-TSH) Venipuncture (16109)   Problem # 3:  HYPERLIPIDEMIA (ICD-272.4) Assessment: Unchanged will recheck labs  His updated medication list for this problem includes:    Simvastatin 10 Mg Tabs (Simvastatin) .Marland Kitchen... Take one by mouth daily  Labs Reviewed: Chol: 138 (05/25/2006)   HDL: 31.5 (05/25/2006)   LDL: DEL (05/25/2006)   TG: 208 (05/25/2006) SGPT: 49 (05/25/2006)  Orders: TLB-Lipid Panel (80061-LIPID) TLB-Hepatic/Liver Function Pnl (80076-HEPATIC)   Problem # 4:  ALLERGIC RHINITIS (ICD-477.9) Assessment: Unchanged okay with the loratadine  His updated medication list for this problem includes:    Claritin Tabs (Loratadine tabs) .Marland Kitchen... Prn   Complete Medication List: 1)  Metformin Hcl 1000 Mg Tabs (Metformin hcl) .... Take 1 tablet by mouth twice a day 2)  Aspirin 81 Mg Tabs (Aspirin) .... Take one by mouth daily 3)  Multivitamins Tabs (Multiple vitamin) .... Take one by mouth daily 4)  Lisinopril 20 Mg Tabs (Lisinopril) .... Take one by mouth daily 5)  Simvastatin 10 Mg Tabs (Simvastatin) .... Take one by mouth daily 6)  Travatan 0.004 % Soln (Travoprost) .... Use one drop in left daily 7)  Claritin Tabs (Loratadine tabs) .... Prn 8)  Glipizide Xl 5 Mg Tb24 (Glipizide) .... Take one by mouth daily 9)  Levitra 20 Mg Tabs (Vardenafil hcl) .... 1/2 -1 before sex as directed   Patient Instructions: 1)  Please schedule a follow-up appointment in 6 months.   ]

## 2010-05-10 NOTE — Letter (Signed)
Summary: Diabetic Eye Exam/[facility]  Diabetic Eye Exam/[facility]   Imported By: Maryln Gottron 03/07/2010 13:43:02  _____________________________________________________________________  External Attachment:    Type:   Image     Comment:   External Document  Appended Document: Diabetic Eye Exam/[facility]    Clinical Lists Changes  Observations: Added new observation of DIAB EYE EX: No diabetic retinopathy.   Glaucoma.    (01/12/2010 7:49)       Diabetic Eye Exam  Procedure date:  01/12/2010  Findings:      No diabetic retinopathy.   Glaucoma.

## 2010-05-10 NOTE — Assessment & Plan Note (Signed)
Summary: lower back pain/rbh   Vital Signs:  Patient Profile:   70 Years Old Male Weight:      245.13 pounds Temp:     97.9 degrees F oral Pulse rate:   76 / minute BP sitting:   110 / 64  (left arm) Cuff size:   regular  Vitals Entered By: Wandra Mannan (January 23, 2007 10:20 AM)                 Chief Complaint:  low back pain .  History of Present Illness: Has had back problems in the past but not like this Notes discomfort across the lumbar back Annoying and intermittent Seems to get better when walking but notes it more at night Not tender Started about 1 month No known injury Still goes to gym but slightly less freq Still cuts grass, walks---no limitations Really notes a problem at night in bed---but not like his typical backaches No radiation to legs but occ feels in upper post left hip No leg weakness  Occ dark urine--will increase his fluids Stable freq Less urgency since he cut out coffee  Hasn't tried any meds  Current Allergies (reviewed today): No known allergies   Past Medical History:    Reviewed history from 09/20/2006 and no changes required:       Allergic rhinitis       Diabetes mellitus, type II       Diverticulosis, colon       Hyperlipidemia       Hypertension       Glaucoma       Osteoarthritis   Social History:    Reviewed history from 09/17/2006 and no changes required:       Marital Status: Married       Children: Widowed 1996, remarried 06/03       Occupation: Retired [employer]       Never Smoked       Rare alcohol     Physical Exam  General:     alert and normal appearance.   Prostate:     no gland enlargement and no nodules.   No tenderness Msk:     no back or spine tenderness Normal flexion SLR is negative bilaterally Neurologic:     strength normal in all extremities.      Impression & Recommendations:  Problem # 1:  LOW BACK PAIN, ACUTE (ICD-724.2) Assessment: New seems to be muscular Advised maintain  his activity try heat and OTC analgesics at bedtime recheck if worsens  Orders: UA Dipstick w/o Micro (81002)   Complete Medication List: 1)  Metformin Hcl 1000 Mg Tabs (Metformin hcl) .... Take 1 tablet by mouth twice a day 2)  Aspirin 81 Mg Tabs (Aspirin) .... Take one by mouth daily 3)  Multivitamins Tabs (Multiple vitamin) .... Take one by mouth daily 4)  Lisinopril 20 Mg Tabs (Lisinopril) .... Take one by mouth daily 5)  Simvastatin 10 Mg Tabs (Simvastatin) .... Take one by mouth daily 6)  Travatan 0.004 % Soln (Travoprost) .... Use one drop in left daily 7)  Claritin Tabs (Loratadine tabs) .... Prn 8)  Glipizide Xl 5 Mg Tb24 (Glipizide) .... Take one by mouth daily   Patient Instructions: 1)  Please schedule a follow-up appointment as needed.    ] Current Allergies (reviewed today): No known allergies  Current Medications (including changes made in today's visit):  METFORMIN HCL 1000 MG TABS (METFORMIN HCL) Take 1 tablet by mouth twice a day  ASPIRIN 81 MG TABS (ASPIRIN) Take one by mouth daily MULTIVITAMINS  TABS (MULTIPLE VITAMIN) Take one by mouth daily LISINOPRIL 20 MG TABS (LISINOPRIL) Take one by mouth daily SIMVASTATIN 10 MG TABS (SIMVASTATIN) Take one by mouth daily TRAVATAN 0.004 % SOLN (TRAVOPROST) Use one drop in left daily CLARITIN  TABS (LORATADINE TABS) prn GLIPIZIDE XL 5 MG TB24 (GLIPIZIDE) Take one by mouth daily   Laboratory Results   Urine Tests  Date/Time Recieved: 01/23/2007  Routine Urinalysis   Color: yellow Appearance: Clear Glucose: negative   (Normal Range: Negative) Bilirubin: negative   (Normal Range: Negative) Ketone: negative   (Normal Range: Negative) Spec. Gravity: <1.005   (Normal Range: 1.003-1.035) Blood: negative   (Normal Range: Negative) pH: 5.0   (Normal Range: 5.0-8.0) Protein: negative   (Normal Range: Negative) Urobilinogen: 0.2   (Normal Range: 0-1) Nitrite: negative   (Normal Range: Negative) Leukocyte Esterace:  negative   (Normal Range: Negative)

## 2010-05-10 NOTE — Assessment & Plan Note (Signed)
Summary: F/U/CLE   Vital Signs:  Patient profile:   70 year old male Weight:      242 pounds Temp:     98.5 degrees F oral Pulse rate:   80 / minute Pulse rhythm:   regular BP sitting:   110 / 68  (left arm) Cuff size:   large  Vitals Entered By: Mervin Hack CMA Duncan Dull) (May 03, 2009 12:07 PM) CC: follow-up visit   History of Present Illness: Continues to get evaluations at [facility] as well Has noted sugars slightly higher in the mornings Has been less compliant with eating and exercise Highest fasting was 167. 116- 140's 108- 192 in evenings Did get a 58 once--can feel if low, nothing severe Has kept up with eye exams No sores or pain in feet  Has been walking some in the past week Walking in the mall  He notes some trouble getting out of bed in the AM episodic depression--had spell of 6 months last year [facility] for [facility] Diagnosed with anxiety  some trouble sleeping with sciatica Back pain was keeping him up Tylenol PM helped him sleep Now mood is better---sleeps okay with this so able to get up easier Wife still goes to work in AM  No chest pain  No SOB Notes decrease in exercise tolerance--better once he gets back to walking  Allergies: No Known Drug Allergies  Past History:  Past medical, surgical, family and social histories (including risk factors) reviewed for relevance to current acute and chronic problems.  Past Medical History: Reviewed history from 10/13/2008 and no changes required. Allergic rhinitis Diabetes mellitus, type II Diverticulosis, colon Hyperlipidemia Hypertension Glaucoma Osteoarthritis  Dr Roxana Hires  Past Surgical History: Reviewed history from 09/17/2006 and no changes required. [place] vet- 20% disability from shrapnel wounds 1967 and  20% agent orange Stress test negative 01/08  Family History: Reviewed history from 09/17/2006 and no changes required. Father: Died age age 85, colon  cancer Mother: Died at age 61, CVA, MI Siblings: 9 HTN- several sibs DM- several sibs CAD-- mom, brother  Social History: Reviewed history from 09/17/2006 and no changes required. Marital Status: Married Children: Widowed 1996, remarried 06/03 Occupation: Retired [employer] Never Smoked Rare alcohol  Review of Systems       some knee pain--may be from walking on concrete in mall Now taking vitamin D--1000 international units daily---livel was low Appetite wasn't that great  Notes occ flight of ideas--changes subjects in mid stream with wife Thinks about [place]---thinks about lost comrades No clear cut flashbacks  Physical Exam  General:  alert and normal appearance.   Neck:  supple, no masses, no thyromegaly, no carotid bruits, and no cervical lymphadenopathy.   Lungs:  normal respiratory effort and normal breath sounds.   Heart:  normal rate, regular rhythm, no murmur, and no gallop.   Msk:  no joint tenderness and no joint swelling.   Pulses:  1+ in feet Extremities:  no edema Skin:  no suspicious lesions and no ulcerations.   Psych:  normally interactive, good eye contact, not anxious appearing, and not depressed appearing.    Diabetes Management Exam:    Foot Exam (with socks and/or shoes not present):       Sensory-Pinprick/Light touch:          Left medial foot (L-4): normal          Left dorsal foot (L-5): normal          Left lateral  foot (S-1): normal          Right medial foot (L-4): normal          Right dorsal foot (L-5): normal          Right lateral foot (S-1): normal       Inspection:          Left foot: normal          Right foot: normal       Nails:          Left foot: normal          Right foot: normal   Impression & Recommendations:  Problem # 1:  DIABETES MELLITUS, TYPE II (ICD-250.00) Assessment Unchanged last A1c 7.3%. This is stable and though suboptimal, should only prompt redoubling of his lifestyle efforts (which have waned)   His  updated medication list for this problem includes:    Metformin Hcl 1000 Mg Tabs (Metformin hcl) .Marland Kitchen... Take 1 tablet by mouth twice a day    Aspirin 81 Mg Tabs (Aspirin) .Marland Kitchen... Take one by mouth daily    Lisinopril 20 Mg Tabs (Lisinopril) .Marland Kitchen... Take one by mouth daily    Glipizide Xl 5 Mg Tb24 (Glipizide) .Marland Kitchen... Take one by mouth daily  Labs Reviewed: Creat: 1.0 (10/13/2008)     Last Eye Exam: normal (09/08/2008) Reviewed HgBA1c results: 7.0 (10/13/2008)  6.6 (09/24/2007)  Problem # 2:  HYPERTENSION (ICD-401.9) Assessment: Unchanged BP fine no changes needed  His updated medication list for this problem includes:    Lisinopril 20 Mg Tabs (Lisinopril) .Marland Kitchen... Take one by mouth daily  BP today: 110/68 Prior BP: 120/60 (01/11/2009)  Labs Reviewed: K+: 4.5 (10/13/2008) Creat: : 1.0 (10/13/2008)   Chol: 143 (10/13/2008)   HDL: 34.50 (10/13/2008)   LDL: 71 (10/13/2008)   TG: 189.0 (10/13/2008)  Problem # 3:  HYPERLIPIDEMIA (ICD-272.4) Assessment: Unchanged good control labs due next time  His updated medication list for this problem includes:    Simvastatin 10 Mg Tabs (Simvastatin) .Marland Kitchen... Take one by mouth daily  Labs Reviewed: SGOT: 58 (10/13/2008)   SGPT: 48 (10/13/2008)   HDL:34.50 (10/13/2008), 31.2 (09/24/2007)  LDL:71 (10/13/2008), 78 (09/24/2007)  Chol:143 (10/13/2008), 141 (09/24/2007)  Trig:189.0 (10/13/2008), 161 (09/24/2007)  Problem # 4:  OSTEOARTHRITIS (ICD-715.90) Assessment: Unchanged does okay in general  Complete Medication List: 1)  Metformin Hcl 1000 Mg Tabs (Metformin hcl) .... Take 1 tablet by mouth twice a day 2)  Aspirin 81 Mg Tabs (Aspirin) .... Take one by mouth daily 3)  Multivitamins Tabs (Multiple vitamin) .... Take one by mouth daily 4)  Lisinopril 20 Mg Tabs (Lisinopril) .... Take one by mouth daily 5)  Simvastatin 10 Mg Tabs (Simvastatin) .... Take one by mouth daily 6)  Travatan 0.004 % Soln (Travoprost) .... Use one drop in left daily 7)   Glipizide Xl 5 Mg Tb24 (Glipizide) .... Take one by mouth daily 8)  Levitra 20 Mg Tabs (Vardenafil hcl) .... 1/2 -1 before sex as directed 9)  Flonase 50 Mcg/act Susp (Fluticasone propionate) .Marland Kitchen.. 1 spray in each nostril at bedtime 10)  Loratadine 10 Mg Tabs (Loratadine) .Marland Kitchen.. 1-2 tabs daily for allergy symptoms 11)  Vitamin D3 1000 Unit Caps (Cholecalciferol) .... Take 1 by mouth once daily  Patient Instructions: 1)  Please schedule a follow-up appointment in 6 months .   Current Allergies (reviewed today): No known allergies

## 2010-05-12 NOTE — Assessment & Plan Note (Signed)
Summary: COLD SYMPTOMS/ lb   Vital Signs:  Patient profile:   70 year old male Weight:      243.50 pounds O2 Sat:      97 % on Room air Temp:     99.1 degrees F oral Pulse rate:   88 / minute Pulse rhythm:   regular BP sitting:   110 / 64  (left arm) Cuff size:   large  Vitals Entered By: Selena Batten Dance CMA (AAMA) (March 28, 2010 3:30 PM)  O2 Flow:  Room air CC: COLD SYMPTOMS   History of Present Illness: CC: cold  8-9 d h/o cough and cold symptoms.  Early in morning sweating as well.  Started around 03/19/2010.  Started with congestion, runny nose, dry cough.  Now congestion better.  Cough continues but nothing comes up.  Tried OTC cough and cold meds - robitussin, and mucinex.  + sweaty.  + decreased appetite.  Cough syrup (robitussin) working well at night.    No fevers, chills, abd pain, n/v.  No myalgia, arthralgia, body aches, rashes.  No ST, ear pain, tooth pain.   + wife sick with same thing.  No h/o asthma.  + seasonal allergies, takes loratadine.  No smokers at home.  Current Medications (verified): 1)  Metformin Hcl 1000 Mg Tabs (Metformin Hcl) .... Take 1 Tablet By Mouth Twice A Day 2)  Aspirin 81 Mg Tabs (Aspirin) .... Take One By Mouth Daily 3)  Lisinopril 20 Mg Tabs (Lisinopril) .... Take One By Mouth Daily 4)  Glipizide Xl 5 Mg Tb24 (Glipizide) .... Take One By Mouth Daily 5)  Levitra 20 Mg  Tabs (Vardenafil Hcl) .... 1/2 -1 Before Sex As Directed 6)  Flonase 50 Mcg/act Susp (Fluticasone Propionate) .Marland Kitchen.. 1 Spray in Each Nostril At Bedtime 7)  Loratadine 10 Mg Tabs (Loratadine) .Marland Kitchen.. 1-2 Tabs Daily For Allergy Symptoms 8)  Travatan 0.004 % Soln (Travoprost) .... Use One Drop in Left Daily 9)  Vitamin D3 1000 Unit Caps (Cholecalciferol) .... Take 1 By Mouth Once Daily 10)  Multivitamins  Tabs (Multiple Vitamin) .... Take One By Mouth Daily  Allergies (verified): No Known Drug Allergies  Past History:  Past Medical History: Last updated: 10/13/2008 Allergic  rhinitis Diabetes mellitus, type II Diverticulosis, colon Hyperlipidemia Hypertension Glaucoma Osteoarthritis  Dr Roxana Hires  Social History: Last updated: 09/17/2006 Marital Status: Married Children: Widowed 1996, remarried 06/03 Occupation: Retired [employer] Never Smoked Rare alcohol  Review of Systems       per HPI  Physical Exam  General:  alert and normal appearance.  nontoxic Head:  normocephalic and atraumatic.   Eyes:  pupils equal, pupils round, pupils reactive to light Ears:  TM s clear bilaterally Nose:  nares clear Mouth:  no erythema and no lesions.  MMM Neck:  supple, no masses, no thyromegaly, no carotid bruits, and no cervical lymphadenopathy.   Lungs:  some insp rhonchi.  otherwise clear, good air movement, normal wob. Heart:  normal rate, regular rhythm, no murmur, and no gallop.   Pulses:  2+ rad pulses   Impression & Recommendations:  Problem # 1:  ACUTE BRONCHITIS (ICD-466.0) supportvie care.  if not better in 4-5 days, consider zpack.  albuterol to help open airways some.  red flags to return discussed. His updated medication list for this problem includes:    Ventolin Hfa 108 (90 Base) Mcg/act Aers (Albuterol sulfate) .Marland Kitchen... 2 puffs q 4-6 hours as needed wheezing/cough  Complete Medication List: 1)  Metformin Hcl 1000 Mg  Tabs (Metformin hcl) .... Take 1 tablet by mouth twice a day 2)  Aspirin 81 Mg Tabs (Aspirin) .... Take one by mouth daily 3)  Lisinopril 20 Mg Tabs (Lisinopril) .... Take one by mouth daily 4)  Glipizide Xl 5 Mg Tb24 (Glipizide) .... Take one by mouth daily 5)  Levitra 20 Mg Tabs (Vardenafil hcl) .... 1/2 -1 before sex as directed 6)  Flonase 50 Mcg/act Susp (Fluticasone propionate) .Marland Kitchen.. 1 spray in each nostril at bedtime 7)  Loratadine 10 Mg Tabs (Loratadine) .Marland Kitchen.. 1-2 tabs daily for allergy symptoms 8)  Travatan 0.004 % Soln (Travoprost) .... Use one drop in left daily 9)  Vitamin D3 1000 Unit Caps (Cholecalciferol) .... Take 1  by mouth once daily 10)  Multivitamins Tabs (Multiple vitamin) .... Take one by mouth daily 11)  Ventolin Hfa 108 (90 Base) Mcg/act Aers (Albuterol sulfate) .... 2 puffs q 4-6 hours as needed wheezing/cough  Patient Instructions: 1)  Flonase refilled. 2)  Sounds like you have a viral upper respiratory infection. 3)  Antibiotics are not needed for this.  Viral infections usually take 7-10 days to resolve.  The cough can last 4 weeks to go away. 4)  Continue mucinex with plenty of fluid to help mobilize mucous. 5)  Albuterol to help open your airways. 6)  If not better by mid week, call us. 7)  Please return if you are not improving as expected, or if you have high fevers (>101.5) or difficulty swallowing. 8)  Call clinic with questions.  Pleasure to see you today!  Prescriptions: VENTOLIN HFA 108 (90 BASE) MCG/ACT AERS (ALBUTEROL SULFATE) 2 puffs q 4-6 hours as needed wheezing/cough  #1 x 1   Entered and Authorized by:   Eustaquio Boyden  MD   Signed by:   Eustaquio Boyden  MD on 03/28/2010   Method used:   Electronically to        [facility]  [address] (retail)       [address]       [place], [place]  [address]       Ph: [phone] or [phone]       Fax: [phone]   RxID:   [id number] FLONASE 50 MCG/ACT SUSP (FLUTICASONE PROPIONATE) 1 spray in each nostril at bedtime  #1 x 3   Entered and Authorized by:   Eustaquio Boyden  MD   Signed by:   Eustaquio Boyden  MD on 03/28/2010   Method used:   [facility] to        [facility]  [id number] ([facility]       [address]       [place], [place]  [address]       Ph: [phone] or [phone]       Fax: [phone]   RxID:   [id number]    Orders Added: 1)  Est. Patient Level III [627[address]]    Current Allergies (reviewed today): No known allergies   Appended Document: Orders Update    Clinical Lists Changes  Orders: Added new Service order of Prescription Created Electronically [phone]) -  Signed

## 2010-05-12 NOTE — Assessment & Plan Note (Signed)
Summary: ROA FOR 6 MONTH FOLLOW-UP/JRR   Vital Signs:  Patient profile:   70 year old male Weight:      241 pounds Temp:     98.7 degrees F oral Pulse rate:   84 / minute Pulse rhythm:   regular BP sitting:   111 / 70  (left arm) Cuff size:   large  Vitals Entered By: Mervin Hack CMA Duncan Dull) (May 06, 2010 9:06 AM) CC: 6 month follow-up   History of Present Illness: Doing okay recent visit at [place] Dr is gone now--saw PA colonoscopy scheduled---due again since polyps in past  Has some knee and hip pain some better lately Back to walking regularly Generally doesn't use meds  Diabetes control has been good recent A1c 7.3% Generally under 170 but occ under 130 No hypoglycemia Trying to eat better and increase water  Got eye exam Needs glaucoma drops in both eyes now  No chest pain NO SOB Lingering cold finally resolved No sig edema No headaches  Allergies: No Known Drug Allergies  Past History:  Past medical, surgical, family and social histories (including risk factors) reviewed for relevance to current acute and chronic problems.  Past Medical History: Allergic rhinitis Diabetes mellitus, type II Diverticulosis, colon Hyperlipidemia Hypertension Glaucoma Osteoarthritis  [facility]    [phone]  Past Surgical History: Reviewed history from 09/17/2006 and no changes required. [place] vet- 20% disability from shrapnel wounds 1967 and  20% agent orange Stress test negative 01/08  Family History: Reviewed history from 09/17/2006 and no changes required. Father: Died age age 14, colon cancer Mother: Died at age 49, CVA, MI Siblings: 9 HTN- several sibs DM- several sibs CAD-- mom, brother  Social History: Reviewed history from 09/17/2006 and no changes required. Marital Status: Married Children: Widowed 1996, remarried 06/03 Occupation: Retired [employer] Never Smoked Rare alcohol  Review of Systems       Sleeps okay at first.  Gets up for nocturia, then hard reinitiating Generally no daytime somnolence appetite is fine Weight down a couple of pounds  Physical Exam  General:  alert and normal appearance.   Neck:  supple, no masses, no thyromegaly, no carotid bruits, and no cervical lymphadenopathy.   Lungs:  normal respiratory effort, no intercostal retractions, no accessory muscle use, and normal breath sounds.   Heart:  normal rate, regular rhythm, no murmur, and no gallop.   Extremities:  No edema  Diabetes Management Exam:    Foot Exam (with socks and/or shoes not present):       Sensory-Pinprick/Light touch:          Left medial foot (L-4): normal          Left dorsal foot (L-5): normal          Left lateral foot (S-1): normal          Right medial foot (L-4): normal          Right dorsal foot (L-5): normal          Right lateral foot (S-1): normal       Inspection:          Left foot: normal          Right foot: normal       Nails:          Left foot: normal          Right foot: normal   Impression & Recommendations:  Problem # 1:  DIABETES MELLITUS, TYPE II (ICD-250.00) Assessment  Unchanged good control no changes needed  His updated medication list for this problem includes:    Metformin Hcl 1000 Mg Tabs (Metformin hcl) .Marland Kitchen... Take 1 tablet by mouth twice a day    Lisinopril 20 Mg Tabs (Lisinopril) .Marland Kitchen... Take one by mouth daily    Glipizide Xl 5 Mg Tb24 (Glipizide) .Marland Kitchen... Take one by mouth daily    Aspirin 81 Mg Tabs (Aspirin) .Marland Kitchen... Take one by mouth daily  Labs Reviewed: Creat: 1.2 (11/03/2009)     Last Eye Exam: No diabetic retinopathy.   Glaucoma.    (01/12/2010) Reviewed HgBA1c results: 7.3 (04/26/2010)  7.2 (11/03/2009)  Problem # 2:  HYPERLIPIDEMIA (ICD-272.4) Assessment: Unchanged  goal is LDL under 100, will restart meds if over 130  Labs Reviewed: SGOT: 52 (11/03/2009)   SGPT: 45 (11/03/2009)   HDL:32.30 (11/03/2009), 34.50 (10/13/2008)  LDL:71 (10/13/2008), 78  (04/54/0981)  Chol:153 (11/03/2009), 143 (10/13/2008)  Trig:242.0 (11/03/2009), 189.0 (10/13/2008)  Orders: TLB-Lipid Panel (80061-LIPID) TLB-Hepatic/Liver Function Pnl (80076-HEPATIC) Venipuncture (19147)  Problem # 3:  HYPERTENSION (ICD-401.9) Assessment: Unchanged good control recent creat normal  His updated medication list for this problem includes:    Lisinopril 20 Mg Tabs (Lisinopril) .Marland Kitchen... Take one by mouth daily  BP today: 111/70 Prior BP: 110/64 (03/28/2010)  Labs Reviewed: K+: 5.3 (11/03/2009) Creat: : 1.2 (11/03/2009)   Chol: 153 (11/03/2009)   HDL: 32.30 (11/03/2009)   LDL: 71 (10/13/2008)   TG: 242.0 (11/03/2009)  Complete Medication List: 1)  Metformin Hcl 1000 Mg Tabs (Metformin hcl) .... Take 1 tablet by mouth twice a day 2)  Lisinopril 20 Mg Tabs (Lisinopril) .... Take one by mouth daily 3)  Glipizide Xl 5 Mg Tb24 (Glipizide) .... Take one by mouth daily 4)  Levitra 20 Mg Tabs (Vardenafil hcl) .... 1/2 -1 before sex as directed 5)  Flonase 50 Mcg/act Susp (Fluticasone propionate) .Marland Kitchen.. 1 spray in each nostril at bedtime 6)  Loratadine 10 Mg Tabs (Loratadine) .Marland Kitchen.. 1-2 tabs daily for allergy symptoms 7)  Travatan 0.004 % Soln (Travoprost) .... Use one drop in left daily 8)  Vitamin D3 1000 Unit Caps (Cholecalciferol) .... Take 1 by mouth once daily 9)  Multivitamins Tabs (Multiple vitamin) .... Take one by mouth daily 10)  Ventolin Hfa 108 (90 Base) Mcg/act Aers (Albuterol sulfate) .... 2 puffs q 4-6 hours as needed wheezing/cough 11)  Aspirin 81 Mg Tabs (Aspirin) .... Take one by mouth daily  Patient Instructions: 1)  Please schedule a follow-up appointment in 6 months .    Orders Added: 1)  TLB-Lipid Panel [80061-LIPID] 2)  TLB-Hepatic/Liver Function Pnl [80076-HEPATIC] 3)  Venipuncture [36415] 4)  Est. Patient Level IV [82956]    Current Allergies (reviewed today): No known allergies

## 2010-07-03 ENCOUNTER — Other Ambulatory Visit: Payer: Self-pay | Admitting: Internal Medicine

## 2010-08-24 LAB — HM COLONOSCOPY

## 2010-09-26 ENCOUNTER — Encounter: Payer: Self-pay | Admitting: Internal Medicine

## 2010-09-26 ENCOUNTER — Encounter: Payer: Self-pay | Admitting: Family Medicine

## 2010-09-26 ENCOUNTER — Ambulatory Visit (INDEPENDENT_AMBULATORY_CARE_PROVIDER_SITE_OTHER): Payer: Medicare Other | Admitting: Family Medicine

## 2010-09-26 VITALS — BP 116/74 | HR 84 | Temp 98.5°F | Ht 72.0 in | Wt 237.1 lb

## 2010-09-26 DIAGNOSIS — R21 Rash and other nonspecific skin eruption: Secondary | ICD-10-CM | POA: Insufficient documentation

## 2010-09-26 DIAGNOSIS — B356 Tinea cruris: Secondary | ICD-10-CM | POA: Insufficient documentation

## 2010-09-26 MED ORDER — GRISEOFULVIN ULTRAMICROSIZE 250 MG PO TABS: 250.0000 mg | ORAL_TABLET | Freq: Two times a day (BID) | ORAL | Status: AC

## 2010-09-26 NOTE — Progress Notes (Signed)
  Subjective:    Patient ID: Lucas [name], male    DOB: Jan 08, 1941, 70 y.o.   MRN: [id number]  HPI CC: rash  rash on arms, legs, hands.  At times pruritic.  On palms as well.  Not on soles.  None in mouth.  Started with whelp in back.  No fevers/chills, nausea, vomiting, abd pain, swelling of mouth, coughing, myalgias/arthralgias, neck pain, HA or confusion. No new medicines, lotions, detergents, foods, soaps.  Recent trip to [place] x 1 wk (09/02/2010).  Has always had jock rash issue.  After pool and hot tub this was worst jock rash issue pt has had in past.  [facility] TCI cream which helps dry up jock rash.  Review of Systems Per HPI    Objective:   Physical Exam  Nursing note and vitals reviewed. Constitutional: He appears well-developed and well-nourished. No distress.  Skin: Skin is warm and dry. Rash noted.          Erythematous pruritic papular rash throughout arms, legs, some on back and trunk, spares soles, spares face, neck, upper chest Scaly plaque thighs and arms. Macular erythematous hyperpigmentation along groin crease, pruritic.         Assessment & Plan:

## 2010-09-26 NOTE — Assessment & Plan Note (Signed)
Longstanding, has been treating with TCI cream alone, recurrent.   Advised to stop TCI cream. Start oral griseofulvin x 2 wks.  Pt denies h/o liver issues, currently not on statin.

## 2010-09-26 NOTE — Assessment & Plan Note (Addendum)
Anticipate dermatophytid reaction to tinea cruris. Supportive care, start with treatment of jock itch as below. KOH today - negative.

## 2010-09-26 NOTE — Patient Instructions (Signed)
I think this is second skin reaction from jock itch Take griseofulvin twice daily for 2 weeks. Update [place] if not improved or any worsening. Use anti fungal powder for jock itch. Call us with questions.  Good to see you today.

## 2010-09-27 LAB — POCT SKIN KOH: Skin KOH, POC: NEGATIVE

## 2010-10-10 ENCOUNTER — Other Ambulatory Visit: Payer: Self-pay | Admitting: *Deleted

## 2010-10-10 MED ORDER — GLIPIZIDE ER 5 MG PO TB24: 5.0000 mg | ORAL_TABLET | Freq: Every day | ORAL | Status: DC

## 2010-11-15 ENCOUNTER — Encounter: Payer: Self-pay | Admitting: Internal Medicine

## 2010-11-15 ENCOUNTER — Ambulatory Visit (INDEPENDENT_AMBULATORY_CARE_PROVIDER_SITE_OTHER): Payer: Medicare Other | Admitting: Internal Medicine

## 2010-11-15 DIAGNOSIS — E785 Hyperlipidemia, unspecified: Secondary | ICD-10-CM

## 2010-11-15 DIAGNOSIS — J309 Allergic rhinitis, unspecified: Secondary | ICD-10-CM

## 2010-11-15 DIAGNOSIS — E119 Type 2 diabetes mellitus without complications: Secondary | ICD-10-CM

## 2010-11-15 DIAGNOSIS — I10 Essential (primary) hypertension: Secondary | ICD-10-CM

## 2010-11-15 LAB — BASIC METABOLIC PANEL
BUN: 18 mg/dL (ref 6–23)
CO2: 29 mEq/L (ref 19–32)
Calcium: 9.4 mg/dL (ref 8.4–10.5)
Chloride: 103 mEq/L (ref 96–112)
Creatinine, Ser: 1 mg/dL (ref 0.4–1.5)
GFR: 97.57 mL/min (ref >60.00)
Glucose, Bld: 157 mg/dL — ABNORMAL HIGH (ref 70–99)
Potassium: 4.6 mEq/L (ref 3.5–5.1)
Sodium: 141 mEq/L (ref 135–145)

## 2010-11-15 LAB — CBC WITH DIFFERENTIAL/PLATELET
Basophils Absolute: 0 10*3/uL (ref 0.0–0.1)
Basophils Relative: 1.1 % (ref 0.0–3.0)
Eosinophils Absolute: 0.2 10*3/uL (ref 0.0–0.7)
Eosinophils Relative: 4.4 % (ref 0.0–5.0)
HCT: 41.5 % (ref 39.0–52.0)
Hemoglobin: 13.6 g/dL (ref 13.0–17.0)
Lymphocytes Relative: 44.5 % (ref 12.0–46.0)
Lymphs Abs: 1.8 10*3/uL (ref 0.7–4.0)
MCHC: 32.8 g/dL (ref 30.0–36.0)
MCV: 86 fl (ref 78.0–100.0)
Monocytes Absolute: 0.3 10*3/uL (ref 0.1–1.0)
Monocytes Relative: 7.5 % (ref 3.0–12.0)
Neutro Abs: 1.7 10*3/uL (ref 1.4–7.7)
Neutrophils Relative %: 42.5 % — ABNORMAL LOW (ref 43.0–77.0)
Platelets: 211 10*3/uL (ref 150.0–400.0)
RBC: 4.83 Mil/uL (ref 4.22–5.81)
RDW: 14.4 % (ref 11.5–14.6)
WBC: 4 10*3/uL — ABNORMAL LOW (ref 4.5–10.5)

## 2010-11-15 LAB — HEPATIC FUNCTION PANEL
ALT: 58 U/L — ABNORMAL HIGH (ref 0–53)
AST: 57 U/L — ABNORMAL HIGH (ref 0–37)
Albumin: 4.1 g/dL (ref 3.5–5.2)
Alkaline Phosphatase: 36 U/L — ABNORMAL LOW (ref 39–117)
Bilirubin, Direct: 0.1 mg/dL (ref 0.0–0.3)
Total Bilirubin: 0.8 mg/dL (ref 0.3–1.2)
Total Protein: 7.4 g/dL (ref 6.0–8.3)

## 2010-11-15 LAB — LIPID PANEL
Cholesterol: 199 mg/dL (ref 0–200)
HDL: 39.8 mg/dL (ref >39.00)
Total CHOL/HDL Ratio: 5
Triglycerides: 257 mg/dL — ABNORMAL HIGH (ref 0.0–149.0)
VLDL: 51.4 mg/dL — ABNORMAL HIGH (ref 0.0–40.0)

## 2010-11-15 LAB — LDL CHOLESTEROL, DIRECT: Direct LDL: 110.9 mg/dL

## 2010-11-15 LAB — HEMOGLOBIN A1C: Hgb A1c MFr Bld: 8.4 % — ABNORMAL HIGH (ref 4.6–6.5)

## 2010-11-15 NOTE — Assessment & Plan Note (Signed)
Discussed increasing regimen as needed

## 2010-11-15 NOTE — Patient Instructions (Signed)
Please try cetirizine (zyrtec) 10mg  daily instead or in addition to the loratadine if needed for your allergies

## 2010-11-15 NOTE — Assessment & Plan Note (Signed)
Seems to still have good control Will recheck labs Lab Results  Component Value Date   HGBA1C 7.3 04/26/2010

## 2010-11-15 NOTE — Assessment & Plan Note (Signed)
Will restart meds if LDL over 100--after discussion He has pravastatin 20mg  from [place]

## 2010-11-15 NOTE — Progress Notes (Signed)
Subjective:    Patient ID: Lucas [name], male    DOB: 06/24/1940, 70 y.o.   MRN: [id number]  HPI Doing okay Groin rash did clear Feels his allergies have acted up more--some relief from 2 loratadine  Trying to get back to early AM walking--had gotten away from this Some AM stiffness  Checks sugars 2-3 times per week Usually ~150 fasting No hypoglycemic reactions but knows "when I have to eat"  Still off statins Was changed to pravastatin 20mg  daily from [place] Discussed Will treat if still above 100 (LDL)  No chest pain No SOB  Current Outpatient Prescriptions on File Prior to Visit  Medication Sig Dispense Refill  . albuterol (VENTOLIN HFA) 108 (90 BASE) MCG/ACT inhaler Inhale 2 puffs into the lungs every 4 (four) hours as needed.        Marland Kitchen aspirin 81 MG tablet Take 81 mg by mouth daily.        . Cholecalciferol (VITAMIN D3) 1000 UNITS CAPS Take 1 capsule by mouth daily.        . fluticasone (FLONASE) 50 MCG/ACT nasal spray Place 1 spray into the nose daily.        Marland Kitchen glipiZIDE (GLUCOTROL) 5 MG 24 hr tablet Take 1 tablet (5 mg total) by mouth daily.  30 tablet  2  . lisinopril (PRINIVIL,ZESTRIL) 20 MG tablet Take 20 mg by mouth daily.        Marland Kitchen loratadine (CLARITIN) 10 MG tablet Take 10 mg by mouth daily.        . metFORMIN (GLUCOPHAGE) 1000 MG tablet Take 1,000 mg by mouth 2 (two) times daily with a meal.        . Multiple Vitamin (MULTIVITAMIN) tablet Take 1 tablet by mouth daily.        . sildenafil (VIAGRA) 25 MG tablet Take 12.5 mg by mouth as directed.        . travoprost, benzalkonium, (TRAVATAN) 0.004 % ophthalmic solution Place 1 drop into the left eye at bedtime.        . triamcinolone (KENALOG) 0.1 % cream Apply 1 application topically 2 (two) times daily.          No Known Allergies  Past Medical History  Diagnosis Date  . Allergic rhinitis   . Diabetes mellitus type II   . Diverticulosis of colon   . HLD (hyperlipidemia)   . HTN (hypertension)   . Glaucoma     . Osteoarthritis   . History of agent Orange exposure     No past surgical history on file.  Family History  Problem Relation Age of Onset  . Colon cancer Father   . Stroke Mother   . Heart attack Mother   . Hypertension      several siblings  . Diabetes      several siblings  . Coronary artery disease Mother   . Coronary artery disease Brother     History   Social History  . Marital Status: Married    Spouse Name: N/A    Number of Children: N/A  . Years of Education: N/A   Occupational History  . Retired-[employer]    Social History Main Topics  . Smoking status: Never Smoker   . Smokeless tobacco: Not on file  . Alcohol Use: Yes     Rare  . Drug Use: No  . Sexually Active: Not on file   Other Topics Concern  . Not on file   Social History Narrative   [place]  vet- 20% disability from shrapnel wounds 1967 and 20% agent orangeMarried (Widowed 1996; remarried 6/03)Retired [employer]   Review of Systems Sleeps okay--does get up ~5AM to void  Appetite is fine Weight fairly stable     Objective:   Physical Exam  Constitutional: He appears well-developed and well-nourished. No distress.  Neck: Normal range of motion. Neck supple. No thyromegaly present.  Cardiovascular: Normal rate, regular rhythm, normal heart sounds and intact distal pulses.  Exam reveals no gallop.   No murmur heard. Pulmonary/Chest: Effort normal and breath sounds normal. No respiratory distress. He has no wheezes. He has no rales.  Musculoskeletal: Normal range of motion. He exhibits no edema and no tenderness.  Lymphadenopathy:    He has no cervical adenopathy.  Neurological:       Normal sensation in feet  Psychiatric: He has a normal mood and affect. His behavior is normal. Judgment and thought content normal.          Assessment & Plan:

## 2010-11-15 NOTE — Assessment & Plan Note (Signed)
BP Readings from Last 3 Encounters:  11/15/10 126/70  09/26/10 116/74  05/06/10 111/70   Good control No changes needed

## 2010-11-17 ENCOUNTER — Encounter: Payer: Self-pay | Admitting: *Deleted

## 2011-01-02 ENCOUNTER — Other Ambulatory Visit: Payer: Self-pay

## 2011-01-02 MED ORDER — GLIPIZIDE ER 5 MG PO TB24: 5.0000 mg | ORAL_TABLET | Freq: Every day | ORAL | Status: DC

## 2011-01-02 NOTE — Telephone Encounter (Signed)
[facility] faxed refill request Glipizide 5 mg #30 x 5.

## 2011-02-14 ENCOUNTER — Other Ambulatory Visit: Payer: Self-pay | Admitting: Internal Medicine

## 2011-02-14 DIAGNOSIS — E119 Type 2 diabetes mellitus without complications: Secondary | ICD-10-CM

## 2011-02-16 ENCOUNTER — Other Ambulatory Visit (INDEPENDENT_AMBULATORY_CARE_PROVIDER_SITE_OTHER): Payer: Medicare Other

## 2011-02-16 DIAGNOSIS — E119 Type 2 diabetes mellitus without complications: Secondary | ICD-10-CM

## 2011-02-16 LAB — HEMOGLOBIN A1C: Hgb A1c MFr Bld: 7.7 % — ABNORMAL HIGH (ref 4.6–6.5)

## 2011-04-18 ENCOUNTER — Ambulatory Visit (INDEPENDENT_AMBULATORY_CARE_PROVIDER_SITE_OTHER): Payer: Medicare Other | Admitting: Family Medicine

## 2011-04-18 ENCOUNTER — Encounter: Payer: Self-pay | Admitting: Family Medicine

## 2011-04-18 DIAGNOSIS — M7918 Myalgia, other site: Secondary | ICD-10-CM | POA: Insufficient documentation

## 2011-04-18 DIAGNOSIS — IMO0001 Reserved for inherently not codable concepts without codable children: Secondary | ICD-10-CM

## 2011-04-18 NOTE — Assessment & Plan Note (Signed)
Anticipate sciatica from L piriformis syndrome. provided with stretching exercises from [facility] pt advisor on piriformis syndrome. Use aleve scheduled bid for 3 days then prn. Update [place] if not improving as expected.

## 2011-04-18 NOTE — Patient Instructions (Addendum)
I think you have piriformis syndrome - see handout. Take alleve twice daily for 3 days then just as needed. Start stretching exercises provided today. Let us know if not improving as expected. Good to see you today, call us with questions.

## 2011-04-18 NOTE — Progress Notes (Signed)
  Subjective:    Patient ID: Lucas [name], male    DOB: [birth year], 71 y.o.   MRN: [id number]  HPI CC: left buttock pain  sxs since thanksgiving (1 1/2 mo).  Pain worse with sitting, laying down.  Occasionally when standing up.  Able to press on buttock, no pain.  Feels more with certain positions.  Stays in R buttock, occasional radiation down posterior left leg.  Denies inciting injury/trauma.  Has fallen twice in last month, both when walking and missed step.  Neither with injury.  No premonitory sxs.  No tingling/numbness, bowel/bladder accidents, fevers/chills.  Has been taking advil which resolves pain.  No ice/heat.  Review of Systems Per HPI    Objective:   Physical Exam  Nursing note and vitals reviewed. Constitutional: He appears well-developed and well-nourished. No distress.  Musculoskeletal:       No midline lumbar spine pain, no paraspinous mm tenderenss, tightness. Neg slr bilaterally. No pain with int/ext rotation at hip. Neg FABER test. No pain at SIJ, GTB, or significant pain at sciatic notch but feels "some beginnings of pain" with palpation just medial to left sciatic notch  Neurological: He has normal strength. No sensory deficit.  Reflex Scores:      Patellar reflexes are 1+ on the right side and 1+ on the left side.      Achilles reflexes are 1+ on the right side and 1+ on the left side. Skin: Skin is warm and dry. No rash noted.  Psychiatric: He has a normal mood and affect.       Assessment & Plan:

## 2011-05-26 ENCOUNTER — Encounter: Payer: Medicare Other | Admitting: Internal Medicine

## 2011-06-06 ENCOUNTER — Encounter: Payer: Self-pay | Admitting: Internal Medicine

## 2011-06-06 ENCOUNTER — Ambulatory Visit (INDEPENDENT_AMBULATORY_CARE_PROVIDER_SITE_OTHER): Payer: Medicare Other | Admitting: Internal Medicine

## 2011-06-06 VITALS — BP 142/78 | HR 91 | Ht 72.0 in | Wt 237.0 lb

## 2011-06-06 DIAGNOSIS — I10 Essential (primary) hypertension: Secondary | ICD-10-CM

## 2011-06-06 DIAGNOSIS — N138 Other obstructive and reflux uropathy: Secondary | ICD-10-CM | POA: Insufficient documentation

## 2011-06-06 DIAGNOSIS — Z0000 Encounter for general adult medical examination without abnormal findings: Secondary | ICD-10-CM | POA: Diagnosis not present

## 2011-06-06 DIAGNOSIS — E785 Hyperlipidemia, unspecified: Secondary | ICD-10-CM

## 2011-06-06 DIAGNOSIS — E119 Type 2 diabetes mellitus without complications: Secondary | ICD-10-CM

## 2011-06-06 DIAGNOSIS — N400 Enlarged prostate without lower urinary tract symptoms: Secondary | ICD-10-CM

## 2011-06-06 DIAGNOSIS — M1990 Unspecified osteoarthritis, unspecified site: Secondary | ICD-10-CM

## 2011-06-06 LAB — HEPATIC FUNCTION PANEL
ALT: 50 U/L (ref 0–53)
AST: 62 U/L — ABNORMAL HIGH (ref 0–37)
Albumin: 4.5 g/dL (ref 3.5–5.2)
Alkaline Phosphatase: 33 U/L — ABNORMAL LOW (ref 39–117)
Bilirubin, Direct: 0.1 mg/dL (ref 0.0–0.3)
Total Bilirubin: 0.5 mg/dL (ref 0.3–1.2)
Total Protein: 7.1 g/dL (ref 6.0–8.3)

## 2011-06-06 LAB — CBC WITH DIFFERENTIAL/PLATELET
Basophils Absolute: 0 10*3/uL (ref 0.0–0.1)
Basophils Relative: 0.4 % (ref 0.0–3.0)
Eosinophils Absolute: 0.2 10*3/uL (ref 0.0–0.7)
Eosinophils Relative: 4.3 % (ref 0.0–5.0)
HCT: 43.6 % (ref 39.0–52.0)
Hemoglobin: 14.1 g/dL (ref 13.0–17.0)
Lymphocytes Relative: 54 % — ABNORMAL HIGH (ref 12.0–46.0)
Lymphs Abs: 2.4 10*3/uL (ref 0.7–4.0)
MCHC: 32.5 g/dL (ref 30.0–36.0)
MCV: 86.6 fl (ref 78.0–100.0)
Monocytes Absolute: 0.3 10*3/uL (ref 0.1–1.0)
Monocytes Relative: 7.2 % (ref 3.0–12.0)
Neutro Abs: 1.5 10*3/uL (ref 1.4–7.7)
Neutrophils Relative %: 34.1 % — ABNORMAL LOW (ref 43.0–77.0)
Platelets: 234 10*3/uL (ref 150.0–400.0)
RBC: 5.03 Mil/uL (ref 4.22–5.81)
RDW: 14.3 % (ref 11.5–14.6)
WBC: 4.5 10*3/uL (ref 4.5–10.5)

## 2011-06-06 LAB — LIPID PANEL
Cholesterol: 157 mg/dL (ref 0–200)
HDL: 35.5 mg/dL — ABNORMAL LOW (ref >39.00)
LDL Cholesterol: 84 mg/dL (ref 0–99)
Total CHOL/HDL Ratio: 4
Triglycerides: 188 mg/dL — ABNORMAL HIGH (ref 0.0–149.0)
VLDL: 37.6 mg/dL (ref 0.0–40.0)

## 2011-06-06 LAB — BASIC METABOLIC PANEL
BUN: 19 mg/dL (ref 6–23)
CO2: 28 mEq/L (ref 19–32)
Calcium: 9.5 mg/dL (ref 8.4–10.5)
Chloride: 99 mEq/L (ref 96–112)
Creatinine, Ser: 1.3 mg/dL (ref 0.4–1.5)
GFR: 72.89 mL/min (ref >60.00)
Glucose, Bld: 115 mg/dL — ABNORMAL HIGH (ref 70–99)
Potassium: 5.1 mEq/L (ref 3.5–5.1)
Sodium: 136 mEq/L (ref 135–145)

## 2011-06-06 LAB — MICROALBUMIN / CREATININE URINE RATIO
Creatinine,U: 200.8 mg/dL
Microalb Creat Ratio: 0.6 mg/g (ref 0.0–30.0)
Microalb, Ur: 1.3 mg/dL (ref 0.0–1.9)

## 2011-06-06 LAB — TSH: TSH: 2.17 u[IU]/mL (ref 0.35–5.50)

## 2011-06-06 LAB — HEMOGLOBIN A1C: Hgb A1c MFr Bld: 8.1 % — ABNORMAL HIGH (ref 4.6–6.5)

## 2011-06-06 LAB — PSA: PSA: 0.65 ng/mL (ref 0.10–4.00)

## 2011-06-06 MED ORDER — TADALAFIL 20 MG PO TABS: 10.0000 mg | ORAL_TABLET | Freq: Every day | ORAL | Status: DC | PRN

## 2011-06-06 NOTE — Progress Notes (Signed)
Subjective:    Patient ID: Lucas [name], male    DOB: 1940/12/03, 71 y.o.   MRN: [id number]  HPI Here for wellness exam Did have some time he was just staying in bed but better now.Gets up to walk every morning Not anhedonic No falls or instability Reviewed advanced directives Chronic hearing problems---uses aides from the [place]  Checking sugars most mornings Running higher-- in 140s often. Briefly over 180 Trying to eat right Keeps up with eye exams---Rx for glaucoma Does have some occ mild hypoglycemic reactions  No chest pain NO SOB Exercise tolerance is good--walks at mall for an hour or so No dizziness or fainting spells  No problems with the chol med  Current Outpatient Prescriptions on File Prior to Visit  Medication Sig Dispense Refill  . albuterol (VENTOLIN HFA) 108 (90 BASE) MCG/ACT inhaler Inhale 2 puffs into the lungs every 4 (four) hours as needed.        Marland Kitchen aspirin 81 MG tablet Take 81 mg by mouth daily.        Marland Kitchen glipiZIDE (GLUCOTROL XL) 5 MG 24 hr tablet Take 1 tablet (5 mg total) by mouth daily.  30 tablet  5  . lisinopril (PRINIVIL,ZESTRIL) 20 MG tablet Take 20 mg by mouth daily.        . metFORMIN (GLUCOPHAGE) 1000 MG tablet Take 1,000 mg by mouth 2 (two) times daily with a meal.        . Multiple Vitamin (MULTIVITAMIN) tablet Take 1 tablet by mouth daily.        . sildenafil (VIAGRA) 25 MG tablet Take 12.5 mg by mouth as directed.        . triamcinolone (KENALOG) 0.1 % cream Apply 1 application topically 2 (two) times daily.          No Known Allergies  Past Medical History  Diagnosis Date  . Allergic rhinitis   . Diabetes mellitus type II   . Diverticulosis of colon   . HLD (hyperlipidemia)   . HTN (hypertension)   . Glaucoma   . Osteoarthritis   . History of agent Orange exposure     No past surgical history on file.  Family History  Problem Relation Age of Onset  . Colon cancer Father   . Stroke Mother   . Heart attack Mother   .  Hypertension      several siblings  . Diabetes      several siblings  . Coronary artery disease Mother   . Coronary artery disease Brother     History   Social History  . Marital Status: Married    Spouse Name: N/A    Number of Children: N/A  . Years of Education: N/A   Occupational History  . Retired-[employer]    Social History Main Topics  . Smoking status: Never Smoker   . Smokeless tobacco: Not on file  . Alcohol Use: Yes     Rare  . Drug Use: No  . Sexually Active: Not on file   Other Topics Concern  . Not on file   Social History Narrative   [place] vet- 20% disability from shrapnel wounds 1967 and 20% agent orangeMarried (Widowed 1996; remarried 6/03)Retired FBIHas living willWife, then daughter Billie Lade will be health care POAWould want attempts at resuscitationWould not want feeding tube if cognitively unaware   Review of Systems Weight down 7# since last visit Sleeps okay Nocturia x 1 only (and this is 5:30AM) Bowels okay usually with fruit and prunes.  Some more difficulty in past year. Goes every 2-3 days    Objective:   Physical Exam  Constitutional: He is oriented to person, place, and time. He appears well-developed and well-nourished. No distress.  Neck: Normal range of motion. Neck supple. No thyromegaly present.  Cardiovascular: Normal rate, regular rhythm, normal heart sounds and intact distal pulses.  Exam reveals no gallop.   No murmur heard. Pulmonary/Chest: Effort normal and breath sounds normal. No respiratory distress. He has no wheezes. He has no rales.  Abdominal: Soft. There is no tenderness.  Musculoskeletal: He exhibits no edema and no tenderness.  Lymphadenopathy:    He has no cervical adenopathy.  Neurological: He is alert and oriented to person, place, and time.       President-- "Thomasene [place], [place]" [phone] [id number] Recall 3/3  Skin: No rash noted. No erythema.  Psychiatric: He has a normal mood and affect.  His behavior is normal. Judgment and thought content normal.          Assessment & Plan:

## 2011-06-06 NOTE — Assessment & Plan Note (Signed)
Has aches and pains Tries to walk daily

## 2011-06-06 NOTE — Assessment & Plan Note (Signed)
Uses saw palmetto  Uses cialis for sex---viagra gives headache

## 2011-06-06 NOTE — Assessment & Plan Note (Signed)
Control is borderline Lab Results  Component Value Date   HGBA1C 7.7* 02/16/2011   Needs to work on increased fitness

## 2011-06-06 NOTE — Assessment & Plan Note (Signed)
I have personally reviewed the Medicare Annual Wellness questionnaire and have noted 1. The patient's medical and social history 2. Their use of alcohol, tobacco or illicit drugs 3. Their current medications and supplements 4. The patient's functional ability including ADL's, fall risks, home safety risks and hearing or visual             impairment. 5. Diet and physical activities 6. Evidence for depression or mood disorders  The patients weight, height, BMI and visual acuity have been recorded in the chart I have made referrals, counseling and provided education to the patient based review of the above and I have provided the pt with a written personalized care plan for preventive services.  I have provided you with a copy of your personalized plan for preventive services. Please take the time to review along with your updated medication list.  UTD on imms and cancer screening Will do PSA but this will be the last time

## 2011-06-06 NOTE — Assessment & Plan Note (Signed)
BP Readings from Last 3 Encounters:  06/06/11 142/78  04/18/11 118/70  11/15/10 126/70   Generally has been controlled No changes Check urine microal also

## 2011-06-06 NOTE — Assessment & Plan Note (Signed)
Lab Results  Component Value Date   LDLCALC 71 10/13/2008   suboptimal control on last check LFTs mildly up so probably won't increase dose

## 2011-06-08 ENCOUNTER — Encounter: Payer: Self-pay | Admitting: *Deleted

## 2011-06-13 DIAGNOSIS — H4011X0 Primary open-angle glaucoma, stage unspecified: Secondary | ICD-10-CM | POA: Diagnosis not present

## 2011-07-02 ENCOUNTER — Other Ambulatory Visit: Payer: Self-pay | Admitting: Internal Medicine

## 2011-09-26 DIAGNOSIS — H4011X0 Primary open-angle glaucoma, stage unspecified: Secondary | ICD-10-CM | POA: Diagnosis not present

## 2011-12-04 ENCOUNTER — Ambulatory Visit: Payer: Medicare Other | Admitting: Internal Medicine

## 2011-12-06 ENCOUNTER — Encounter: Payer: Self-pay | Admitting: Internal Medicine

## 2011-12-06 ENCOUNTER — Ambulatory Visit (INDEPENDENT_AMBULATORY_CARE_PROVIDER_SITE_OTHER): Payer: Medicare Other | Admitting: Internal Medicine

## 2011-12-06 VITALS — BP 118/78 | HR 83 | Temp 98.6°F | Ht 72.0 in | Wt 239.0 lb

## 2011-12-06 DIAGNOSIS — M1990 Unspecified osteoarthritis, unspecified site: Secondary | ICD-10-CM

## 2011-12-06 DIAGNOSIS — I10 Essential (primary) hypertension: Secondary | ICD-10-CM

## 2011-12-06 DIAGNOSIS — F341 Dysthymic disorder: Secondary | ICD-10-CM | POA: Insufficient documentation

## 2011-12-06 DIAGNOSIS — N400 Enlarged prostate without lower urinary tract symptoms: Secondary | ICD-10-CM

## 2011-12-06 DIAGNOSIS — E119 Type 2 diabetes mellitus without complications: Secondary | ICD-10-CM | POA: Diagnosis not present

## 2011-12-06 LAB — HEMOGLOBIN A1C: Hgb A1c MFr Bld: 7.9 % — ABNORMAL HIGH (ref 4.6–6.5)

## 2011-12-06 NOTE — Assessment & Plan Note (Signed)
Mild symptoms Okay without Rx for now

## 2011-12-06 NOTE — Assessment & Plan Note (Signed)
Has fixed injury to left shoulder Discussed light weight training and ROM

## 2011-12-06 NOTE — Assessment & Plan Note (Signed)
Has had some mood problems  Now also some component of grieving Not major depression Discussed need for purpose--- ?volunteer work

## 2011-12-06 NOTE — Assessment & Plan Note (Signed)
Lab Results  Component Value Date   HGBA1C 8.1* 06/06/2011   Feels his sugars are some better Discussed weight loss and continued exercise Will increase glipizide if worse this time

## 2011-12-06 NOTE — Assessment & Plan Note (Signed)
BP Readings from Last 3 Encounters:  12/06/11 118/78  06/06/11 142/78  04/18/11 118/70   Good control No changes needed

## 2011-12-06 NOTE — Progress Notes (Signed)
Subjective:    Patient ID: Lucas [name], male    DOB: 1940-10-04, 71 y.o.   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 albuterol (VENTOLIN HFA) 108 (90 BASE) MCG/ACT inhaler Inhale 2 puffs into the lungs every 4 (four) hours as needed.        Marland Kitchen aspirin 81 MG tablet Take 81 mg by mouth daily.        . brimonidine (ALPHAGAN) 0.2 % ophthalmic solution Place 1 drop into both eyes 2 (two) times daily.       . cetirizine (ZYRTEC) 10 MG tablet Take 10 mg by mouth daily.      Marland Kitchen glipiZIDE (GLUCOTROL XL) 5 MG 24 hr tablet TAKE 1 TABLET (5 MG TOTAL) BY MOUTH DAILY.  30 tablet  5  . latanoprost (XALATAN) 0.005 % ophthalmic solution Place 1 drop into both eyes at bedtime.       Marland Kitchen lisinopril (PRINIVIL,ZESTRIL) 20 MG tablet Take 20 mg by mouth daily.        . metFORMIN (GLUCOPHAGE) 1000 MG tablet Take 1,000 mg by mouth 2 (two) times daily with a meal.        . Multiple Vitamin (MULTIVITAMIN) tablet Take 1 tablet by mouth daily.        . pravastatin (PRAVACHOL) 20 MG tablet Take 20 mg by mouth daily.       Marland Kitchen triamcinolone (KENALOG) 0.1 % cream Apply 1 application topically 2 (two) times daily.        Marland Kitchen DISCONTD: tadalafil (CIALIS) 20 MG tablet Take 0.5-1 tablets (10-20 mg total) by mouth daily as  needed for erectile dysfunction.  2 tablet  11    No Known Allergies  Past Medical History  Diagnosis Date  . Allergic rhinitis   . Diabetes mellitus type II   . Diverticulosis of colon   . HLD (hyperlipidemia)   . HTN (hypertension)   . Glaucoma   . Osteoarthritis   . History of agent Orange exposure   . Hypertrophy of prostate without urinary obstruction and other lower urinary tract symptoms (LUTS)     No past surgical history on file.  Family History  Problem Relation Age of Onset  . Colon cancer Father   . Stroke Mother   . Heart attack Mother   . Hypertension      several siblings  . Diabetes      several siblings  . Coronary artery disease Mother   . Coronary artery disease Brother     History   Social History  . Marital Status: Married    Spouse Name: N/A    Number of Children: N/A  . Years of Education: N/A   Occupational History  . Retired-[employer]    Social History Main Topics  . Smoking status:  Never Smoker   . Smokeless tobacco: Never Used  . Alcohol Use: Yes     Rare  . Drug Use: No  . Sexually Active: Not on file   Other Topics Concern  . Not on file   Social History Narrative   [place] vet- 20% disability from shrapnel wounds 1967 and 20% agent orangeMarried (Widowed 1996; remarried 6/03)Retired FBIHas living willWife, then daughter Billie Lade will be health care POAWould want attempts at resuscitationWould not want feeding tube if cognitively unaware    Review of Systems Weight is stable despite doing more exercise Sleeps okay    Objective:   Physical Exam  Constitutional: He appears well-developed and well-nourished. No distress.  Neck: Normal range of motion. Neck supple. No thyromegaly present.  Cardiovascular: Normal rate, regular rhythm, normal heart sounds and intact distal pulses.  Exam reveals no gallop.   No murmur heard. Pulmonary/Chest: Effort normal and breath sounds normal. No respiratory distress. He has no wheezes. He has  no rales.  Musculoskeletal: He exhibits no edema and no tenderness.       Markedly restricted external rotation of left shoulder but okay in other spheres. Not really painful  Lymphadenopathy:    He has no cervical adenopathy.  Skin: No rash noted. No erythema.  Psychiatric: He has a normal mood and affect. His behavior is normal. Thought content normal.          Assessment & Plan:

## 2011-12-08 ENCOUNTER — Encounter: Payer: Self-pay | Admitting: *Deleted

## 2011-12-18 ENCOUNTER — Other Ambulatory Visit: Payer: Self-pay | Admitting: *Deleted

## 2011-12-18 MED ORDER — GLIPIZIDE ER 5 MG PO TB24: 5.0000 mg | ORAL_TABLET | Freq: Every day | ORAL | Status: DC

## 2012-01-03 DIAGNOSIS — H40059 Ocular hypertension, unspecified eye: Secondary | ICD-10-CM | POA: Diagnosis not present

## 2012-02-26 ENCOUNTER — Telehealth: Payer: Self-pay | Admitting: Internal Medicine

## 2012-02-26 NOTE — Telephone Encounter (Signed)
Will discuss at his visit

## 2012-02-26 NOTE — Telephone Encounter (Signed)
Pt/Lucas [name] calling and states that he has not been able to keep his blood sugar under control. Was 176 this morning when he woke up. Went for a 3 mile walk, then was 216. Ate breakfast and then was 296. States that he knows he is not following his diet like he is supposed to and wants to come in to talk with Dr. Alphonsus Sias about changing his medications. Currently takes Metformin and Glipizide. All emergent symptoms per Diabetes Blood Sugar Control protocol ruled out. Home care advice given. Appointment scheduled for 02/27/12 at 1130AM with Dr. Alphonsus Sias per patient request.

## 2012-02-27 ENCOUNTER — Encounter: Payer: Self-pay | Admitting: Internal Medicine

## 2012-02-27 ENCOUNTER — Ambulatory Visit (INDEPENDENT_AMBULATORY_CARE_PROVIDER_SITE_OTHER): Payer: Medicare Other | Admitting: Internal Medicine

## 2012-02-27 VITALS — BP 128/70 | HR 90 | Temp 98.0°F | Wt 244.0 lb

## 2012-02-27 DIAGNOSIS — E119 Type 2 diabetes mellitus without complications: Secondary | ICD-10-CM

## 2012-02-27 LAB — HEMOGLOBIN A1C: Hgb A1c MFr Bld: 8.4 % — ABNORMAL HIGH (ref 4.6–6.5)

## 2012-02-27 NOTE — Progress Notes (Signed)
Subjective:    Patient ID: Lucas [name], male    DOB: 02/22/1941, 71 y.o.   MRN: [id number]  HPI Diabetes control has suddenly worsened High readings for the past 3 weeks Has tried to work to get them down Notes that his weight is up some (5#)  AM sugar 174 fasting yesterday Took glipizide/metformin and walked 3 miles at mall Didn't eat Went up to 216 After eating went up to 296 Then back down to 210 Eat lunch Down to 102 at 2PM  This AM fasting was 127 Up later to 183  Current Outpatient Prescriptions on File Prior to Visit  Medication Sig Dispense Refill  . albuterol (VENTOLIN HFA) 108 (90 BASE) MCG/ACT inhaler Inhale 2 puffs into the lungs every 4 (four) hours as needed.        Marland Kitchen aspirin 81 MG tablet Take 81 mg by mouth daily.        . brimonidine (ALPHAGAN) 0.2 % ophthalmic solution Place 1 drop into both eyes 2 (two) times daily.       . cetirizine (ZYRTEC) 10 MG tablet Take 10 mg by mouth daily.      Marland Kitchen glipiZIDE (GLUCOTROL XL) 5 MG 24 hr tablet Take 1 tablet (5 mg total) by mouth daily.  30 tablet  11  . latanoprost (XALATAN) 0.005 % ophthalmic solution Place 1 drop into both eyes at bedtime.       Marland Kitchen lisinopril (PRINIVIL,ZESTRIL) 20 MG tablet Take 20 mg by mouth daily.        . metFORMIN (GLUCOPHAGE) 1000 MG tablet Take 1,000 mg by mouth 2 (two) times daily with a meal.        . Multiple Vitamin (MULTIVITAMIN) tablet Take 1 tablet by mouth daily.        . pravastatin (PRAVACHOL) 20 MG tablet Take 20 mg by mouth daily.       Marland Kitchen triamcinolone (KENALOG) 0.1 % cream Apply 1 application topically 2 (two) times daily.          No Known Allergies  Past Medical History  Diagnosis Date  . Allergic rhinitis   . Diabetes mellitus type II   . Diverticulosis of colon   . HLD (hyperlipidemia)   . HTN (hypertension)   . Glaucoma(365)   . Osteoarthritis   . History of agent Orange exposure   . Hypertrophy of prostate without urinary obstruction and other lower urinary tract  symptoms (LUTS)     No past surgical history on file.  Family History  Problem Relation Age of Onset  . Colon cancer Father   . Stroke Mother   . Heart attack Mother   . Hypertension      several siblings  . Diabetes      several siblings  . Coronary artery disease Mother   . Coronary artery disease Brother     History   Social History  . Marital Status: Married    Spouse Name: N/A    Number of Children: N/A  . Years of Education: N/A   Occupational History  . Retired-[employer]    Social History Main Topics  . Smoking status: Never Smoker   . Smokeless tobacco: Never Used  . Alcohol Use: Yes     Comment: Rare  . Drug Use: No  . Sexually Active: Not on file   Other Topics Concern  . 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 His behavior is normal.          Assessment & Plan:

## 2012-02-27 NOTE — Assessment & Plan Note (Addendum)
Hard to judge his control given the variability in his sugars Discussed his lifestyle and eating since he does exercise  Will recheck A1c If well above 8%---will add januvia  Counseled and care planning for all of 15 minute visit

## 2012-03-01 ENCOUNTER — Telehealth: Payer: Self-pay | Admitting: *Deleted

## 2012-03-01 MED ORDER — SITAGLIPTIN PHOSPHATE 50 MG PO TABS: 50.0000 mg | ORAL_TABLET | Freq: Every day | ORAL | Status: DC

## 2012-03-01 NOTE — Telephone Encounter (Signed)
Message copied by Sueanne Margarita on Fri Mar 01, 2012  4:39 PM ------      Message from: Patience Musca      Created: Thu Feb 29, 2012  2:13 PM       Pt left v/m requesting call back [phone].

## 2012-03-01 NOTE — Telephone Encounter (Signed)
rx sent to pharmacy by e-script  

## 2012-03-06 ENCOUNTER — Ambulatory Visit (INDEPENDENT_AMBULATORY_CARE_PROVIDER_SITE_OTHER): Payer: Medicare Other

## 2012-03-06 DIAGNOSIS — Z23 Encounter for immunization: Secondary | ICD-10-CM

## 2012-03-27 DIAGNOSIS — H401290 Low-tension glaucoma, unspecified eye, stage unspecified: Secondary | ICD-10-CM | POA: Diagnosis not present

## 2012-03-28 LAB — HM DIABETES EYE EXAM

## 2012-04-16 ENCOUNTER — Ambulatory Visit (INDEPENDENT_AMBULATORY_CARE_PROVIDER_SITE_OTHER): Payer: Self-pay | Admitting: Family Medicine

## 2012-04-16 DIAGNOSIS — Z713 Dietary counseling and surveillance: Secondary | ICD-10-CM

## 2012-07-02 ENCOUNTER — Encounter: Payer: Self-pay | Admitting: Internal Medicine

## 2012-07-02 ENCOUNTER — Ambulatory Visit (INDEPENDENT_AMBULATORY_CARE_PROVIDER_SITE_OTHER): Payer: Medicare Other | Admitting: Internal Medicine

## 2012-07-02 VITALS — BP 132/70 | HR 85 | Temp 98.3°F | Ht 72.0 in | Wt 238.0 lb

## 2012-07-02 DIAGNOSIS — N400 Enlarged prostate without lower urinary tract symptoms: Secondary | ICD-10-CM | POA: Diagnosis not present

## 2012-07-02 DIAGNOSIS — Z1331 Encounter for screening for depression: Secondary | ICD-10-CM | POA: Diagnosis not present

## 2012-07-02 DIAGNOSIS — E119 Type 2 diabetes mellitus without complications: Secondary | ICD-10-CM | POA: Diagnosis not present

## 2012-07-02 DIAGNOSIS — M5432 Sciatica, left side: Secondary | ICD-10-CM

## 2012-07-02 DIAGNOSIS — I10 Essential (primary) hypertension: Secondary | ICD-10-CM | POA: Diagnosis not present

## 2012-07-02 DIAGNOSIS — Z0000 Encounter for general adult medical examination without abnormal findings: Secondary | ICD-10-CM | POA: Diagnosis not present

## 2012-07-02 DIAGNOSIS — M5430 Sciatica, unspecified side: Secondary | ICD-10-CM

## 2012-07-02 DIAGNOSIS — E785 Hyperlipidemia, unspecified: Secondary | ICD-10-CM | POA: Diagnosis not present

## 2012-07-02 LAB — TSH: TSH: 2.11 u[IU]/mL (ref 0.35–5.50)

## 2012-07-02 LAB — HEPATIC FUNCTION PANEL
ALT: 49 U/L (ref 0–53)
AST: 48 U/L — ABNORMAL HIGH (ref 0–37)
Albumin: 4.1 g/dL (ref 3.5–5.2)
Alkaline Phosphatase: 35 U/L — ABNORMAL LOW (ref 39–117)
Bilirubin, Direct: 0.1 mg/dL (ref 0.0–0.3)
Total Bilirubin: 0.5 mg/dL (ref 0.3–1.2)
Total Protein: 7.1 g/dL (ref 6.0–8.3)

## 2012-07-02 LAB — BASIC METABOLIC PANEL
BUN: 23 mg/dL (ref 6–23)
CO2: 26 mEq/L (ref 19–32)
Calcium: 9.3 mg/dL (ref 8.4–10.5)
Chloride: 104 mEq/L (ref 96–112)
Creatinine, Ser: 1.2 mg/dL (ref 0.4–1.5)
GFR: 80.74 mL/min (ref >60.00)
Glucose, Bld: 143 mg/dL — ABNORMAL HIGH (ref 70–99)
Potassium: 4.6 mEq/L (ref 3.5–5.1)
Sodium: 138 mEq/L (ref 135–145)

## 2012-07-02 LAB — CBC WITH DIFFERENTIAL/PLATELET
Basophils Absolute: 0 10*3/uL (ref 0.0–0.1)
Basophils Relative: 0.8 % (ref 0.0–3.0)
Eosinophils Absolute: 0.2 10*3/uL (ref 0.0–0.7)
Eosinophils Relative: 4.3 % (ref 0.0–5.0)
HCT: 40.8 % (ref 39.0–52.0)
Hemoglobin: 13.6 g/dL (ref 13.0–17.0)
Lymphocytes Relative: 45.4 % (ref 12.0–46.0)
Lymphs Abs: 1.9 10*3/uL (ref 0.7–4.0)
MCHC: 33.3 g/dL (ref 30.0–36.0)
MCV: 84.7 fl (ref 78.0–100.0)
Monocytes Absolute: 0.3 10*3/uL (ref 0.1–1.0)
Monocytes Relative: 7.6 % (ref 3.0–12.0)
Neutro Abs: 1.7 10*3/uL (ref 1.4–7.7)
Neutrophils Relative %: 41.9 % — ABNORMAL LOW (ref 43.0–77.0)
Platelets: 218 10*3/uL (ref 150.0–400.0)
RBC: 4.82 Mil/uL (ref 4.22–5.81)
RDW: 14.4 % (ref 11.5–14.6)
WBC: 4.1 10*3/uL — ABNORMAL LOW (ref 4.5–10.5)

## 2012-07-02 LAB — LDL CHOLESTEROL, DIRECT: Direct LDL: 75.7 mg/dL

## 2012-07-02 LAB — LIPID PANEL
Cholesterol: 138 mg/dL (ref 0–200)
HDL: 28.3 mg/dL — ABNORMAL LOW (ref >39.00)
Total CHOL/HDL Ratio: 5
Triglycerides: 256 mg/dL — ABNORMAL HIGH (ref 0.0–149.0)
VLDL: 51.2 mg/dL — ABNORMAL HIGH (ref 0.0–40.0)

## 2012-07-02 LAB — HM DIABETES FOOT EXAM

## 2012-07-02 LAB — HEMOGLOBIN A1C: Hgb A1c MFr Bld: 7.8 % — ABNORMAL HIGH (ref 4.6–6.5)

## 2012-07-02 LAB — PSA: PSA: 0.71 ng/mL (ref 0.10–4.00)

## 2012-07-02 NOTE — Assessment & Plan Note (Signed)
No signs of disc compression Will try the ibuprofen regularly at night

## 2012-07-02 NOTE — Assessment & Plan Note (Signed)
Control is better Will increase januvia if still over 8%

## 2012-07-02 NOTE — Assessment & Plan Note (Signed)
At goal.  No changes needed.

## 2012-07-02 NOTE — Patient Instructions (Signed)
Please try ibuprofen 400-600mg  at bedtime till the sciatica goes away

## 2012-07-02 NOTE — Assessment & Plan Note (Signed)
I have personally reviewed the Medicare Annual Wellness questionnaire and have noted 1. The patient's medical and social history 2. Their use of alcohol, tobacco or illicit drugs 3. Their current medications and supplements 4. The patient's functional ability including ADL's, fall risks, home safety risks and hearing or visual             impairment. 5. Diet and physical activities 6. Evidence for depression or mood disorders  The patients weight, height, BMI and visual acuity have been recorded in the chart I have made referrals, counseling and provided education to the patient based review of the above and I have provided the pt with a written personalized care plan for preventive services.  I have provided you with a copy of your personalized plan for preventive services. Please take the time to review along with your updated medication list.  UTD with imms Will check one more PSA after discussion

## 2012-07-02 NOTE — Assessment & Plan Note (Signed)
Noticeable symptoms but he wants to hold off on meds Will start flomax if worsens

## 2012-07-02 NOTE — Progress Notes (Signed)
Subjective:    Patient ID: Lucas [name], male    DOB: [birth year], 72 y.o.   MRN: [id number]  HPI Here for Medicare wellness and follow up No smoking or alcohol Wears hearing aide in left ear. Vision okay--recent exam Does try to walk regularly Reviewed other physicians Reviewed advanced directives Independent in ADLs and instrumental ADLs Mild memory issues but no functional issues (will spend more time "looking for stuff")  Has had a tough couple of months Started with back pain and left leg pain ----especially at night affecting his sleep Seems some better finally since last week--may be due to taking ibuprofen for his cold Stiff in AM Doesn't remember any injury but had travelled out of state and then came home with lifting etc, putting away Christmas stuff  Cold last week Seems to be clearing up  Has tolerated new medication Now doesn't get the really high readings 130-140 fasting for the most part Weight down 6# No sig hypoglycemic spells  No chest pain No SOB No dizziness or syncope No edema  Still has urinary frequency Can have mild urge incontinence  Current Outpatient Prescriptions on File Prior to Visit  Medication Sig Dispense Refill  . albuterol (VENTOLIN HFA) 108 (90 BASE) MCG/ACT inhaler Inhale 2 puffs into the lungs every 4 (four) hours as needed.        Marland Kitchen aspirin 81 MG tablet Take 81 mg by mouth daily.        . brimonidine (ALPHAGAN) 0.2 % ophthalmic solution Place 1 drop into both eyes 2 (two) times daily.       . cetirizine (ZYRTEC) 10 MG tablet Take 10 mg by mouth daily.      Marland Kitchen glipiZIDE (GLUCOTROL XL) 5 MG 24 hr tablet Take 1 tablet (5 mg total) by mouth daily.  30 tablet  11  . latanoprost (XALATAN) 0.005 % ophthalmic solution Place 1 drop into both eyes at bedtime.       Marland Kitchen lisinopril (PRINIVIL,ZESTRIL) 20 MG tablet Take 20 mg by mouth daily.        . metFORMIN (GLUCOPHAGE) 1000 MG tablet Take 1,000 mg by mouth 2 (two) times daily with a meal.         . Multiple Vitamin (MULTIVITAMIN) tablet Take 1 tablet by mouth daily.        . pravastatin (PRAVACHOL) 20 MG tablet Take 10 mg by mouth daily.       . sitaGLIPtin (JANUVIA) 50 MG tablet Take 1 tablet (50 mg total) by mouth daily.  90 tablet  3  . VIAGRA 50 MG tablet Take 50 mg by mouth as needed.        No current facility-administered medications on file prior to visit.    No Known Allergies  Past Medical History  Diagnosis Date  . Allergic rhinitis   . Diabetes mellitus type II   . Diverticulosis of colon   . HLD (hyperlipidemia)   . HTN (hypertension)   . Glaucoma(365)   . Osteoarthritis   . History of agent Orange exposure   . Hypertrophy of prostate without urinary obstruction and other lower urinary tract symptoms (LUTS)     No past surgical history on file.  Family History  Problem Relation Age of Onset  . Colon cancer Father   . Stroke Mother   . Heart attack Mother   . Hypertension      several siblings  . Diabetes      several siblings  . Coronary artery disease  Mother   . Coronary artery disease Brother     History   Social History  . Marital Status: Married    Spouse Name: N/A    Number of Children: N/A  . Years of Education: N/A   Occupational History  . Retired-[employer]    Social History Main Topics  . Smoking status: Never Smoker   . Smokeless tobacco: Never Used  . Alcohol Use: Yes     Comment: Rare  . Drug Use: No  . Sexually Active: Not on file   Other Topics Concern  . Not on file   Social History Narrative   [place] vet- 20% disability from shrapnel wounds 1967 and 20% agent orange   Married (Widowed 1996; remarried 6/03)   Retired [employer]      Has living will   Wife, then daughter Billie Lade will be health care POA   Would want attempts at resuscitation   Would not want feeding tube if cognitively unaware   Review of Systems Appetite is okay No depression or anhedonia Bowels have been okay    Objective:   Physical Exam   Constitutional: He is oriented to person, place, and time. He appears well-developed and well-nourished. No distress.  HENT:  Mouth/Throat: Oropharynx is clear and moist. No oropharyngeal exudate.  Mild nasal congestion TMs fine  Neck: Normal range of motion. Neck supple. No thyromegaly present.  Cardiovascular: Normal rate, regular rhythm, normal heart sounds and intact distal pulses.  Exam reveals no gallop.   No murmur heard. Pulmonary/Chest: Effort normal and breath sounds normal. No respiratory distress. He has no wheezes. He has no rales.  Abdominal: Soft. There is no tenderness.  Musculoskeletal: He exhibits no edema and no tenderness.  No spine tenderness SLR negative Normal strength and gait  Lymphadenopathy:    He has no cervical adenopathy.  Neurological: He is alert and oriented to person, place, and time.  President-- "Susa Loffler, [place], [place]" [phone] [id number] Recall 2/3  Skin: No rash noted.  No foot lesions  Psychiatric: He has a normal mood and affect. His behavior is normal.          Assessment & Plan:

## 2012-07-02 NOTE — Assessment & Plan Note (Signed)
Due for labs

## 2012-07-03 DIAGNOSIS — H4011X0 Primary open-angle glaucoma, stage unspecified: Secondary | ICD-10-CM | POA: Diagnosis not present

## 2012-10-16 DIAGNOSIS — E119 Type 2 diabetes mellitus without complications: Secondary | ICD-10-CM | POA: Diagnosis not present

## 2012-10-16 DIAGNOSIS — H4011X0 Primary open-angle glaucoma, stage unspecified: Secondary | ICD-10-CM | POA: Diagnosis not present

## 2012-11-13 ENCOUNTER — Other Ambulatory Visit: Payer: Self-pay

## 2012-12-31 ENCOUNTER — Encounter: Payer: Self-pay | Admitting: Internal Medicine

## 2012-12-31 ENCOUNTER — Ambulatory Visit (INDEPENDENT_AMBULATORY_CARE_PROVIDER_SITE_OTHER): Payer: Medicare Other | Admitting: Internal Medicine

## 2012-12-31 VITALS — BP 128/70 | HR 104 | Ht 72.0 in | Wt 237.0 lb

## 2012-12-31 DIAGNOSIS — Z23 Encounter for immunization: Secondary | ICD-10-CM | POA: Diagnosis not present

## 2012-12-31 DIAGNOSIS — I10 Essential (primary) hypertension: Secondary | ICD-10-CM

## 2012-12-31 DIAGNOSIS — E785 Hyperlipidemia, unspecified: Secondary | ICD-10-CM

## 2012-12-31 DIAGNOSIS — E119 Type 2 diabetes mellitus without complications: Secondary | ICD-10-CM | POA: Diagnosis not present

## 2012-12-31 LAB — HEMOGLOBIN A1C: Hgb A1c MFr Bld: 7.6 % — ABNORMAL HIGH (ref 4.6–6.5)

## 2012-12-31 NOTE — Progress Notes (Signed)
Subjective:    Patient ID: Lucas [name], male    DOB: 1940/11/19, 72 y.o.   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 albuterol (VENTOLIN HFA) 108 (90 BASE) MCG/ACT inhaler Inhale 2 puffs into the lungs every 4 (four) hours as needed.        Marland Kitchen aspirin 81 MG tablet Take 81 mg by mouth daily.        . brimonidine (ALPHAGAN) 0.2 % ophthalmic solution Place 1 drop into both eyes 2 (two) times daily.       . cetirizine (ZYRTEC) 10 MG tablet Take 10 mg by mouth daily.      Marland Kitchen glipiZIDE (GLUCOTROL XL) 5 MG 24 hr tablet Take 1 tablet (5 mg total) by mouth daily.  30 tablet  11  . latanoprost (XALATAN) 0.005 % ophthalmic solution Place 1 drop into both eyes at bedtime.       Marland Kitchen lisinopril (PRINIVIL,ZESTRIL) 20 MG tablet Take 20 mg by mouth daily.        . metFORMIN (GLUCOPHAGE) 1000 MG tablet Take 1,000 mg by mouth 2 (two) times daily with a meal.        . Multiple Vitamin (MULTIVITAMIN) tablet Take 1 tablet by mouth daily.        . pravastatin (PRAVACHOL) 20 MG tablet Take 10 mg by mouth daily.       . sitaGLIPtin (JANUVIA) 50 MG tablet Take 1 tablet (50 mg total) by mouth daily.  90 tablet  3  . VIAGRA 50 MG tablet Take 50 mg by mouth as needed.        No current facility-administered medications on file prior to visit.    No Known Allergies  Past Medical History  Diagnosis Date  . Allergic rhinitis   . Diabetes mellitus  type II   . Diverticulosis of colon   . HLD (hyperlipidemia)   . HTN (hypertension)   . Glaucoma   . Osteoarthritis   . History of agent Orange exposure   . Hypertrophy of prostate without urinary obstruction and other lower urinary tract symptoms (LUTS)     No past surgical history on file.  Family History  Problem Relation Age of Onset  . Colon cancer Father   . Stroke Mother   . Heart attack Mother   . Hypertension      several siblings  . Diabetes      several siblings  . Coronary artery disease Mother   . Coronary artery disease Brother     History   Social History  . Marital Status: Married    Spouse Name: N/A    Number of Children: N/A  . Years of Education: N/A   Occupational History  . Retired-[employer]    Social History Main Topics  . Smoking status: Never Smoker   . Smokeless tobacco: Never Used  . Alcohol Use: Yes  Comment: Rare  . Drug Use: No  . Sexual Activity: Not on file   Other Topics Concern  . Not on file   Social History Narrative   [place] vet- 20% disability from shrapnel wounds 1967 and 20% agent orange   Married (Widowed 1996; remarried 6/03)   Retired [employer]      Has living will   Wife, then daughter Billie Lade will be health care POA   Would want attempts at resuscitation   Would not want feeding tube if cognitively unaware   Review of Systems Sleeps okay More stress with some life issues--like deciding to be buried in [place] and moving his 1st wife there     Objective:   Physical Exam  Constitutional: He appears well-developed and well-nourished. No distress.  Neck: Normal range of motion. Neck supple. No thyromegaly present.  Cardiovascular: Normal rate, regular rhythm, normal heart sounds and intact distal pulses.  Exam reveals no gallop.   No murmur heard. Pulmonary/Chest: Effort normal and breath sounds normal. No respiratory distress. He has no wheezes. He has no rales.  Musculoskeletal: He exhibits no edema and no  tenderness.  Lymphadenopathy:    He has no cervical adenopathy.  Skin: No rash noted.  Psychiatric: He has a normal mood and affect. His behavior is normal.          Assessment & Plan:

## 2012-12-31 NOTE — Assessment & Plan Note (Signed)
BP Readings from Last 3 Encounters:  12/31/12 128/70  07/02/12 132/70  02/27/12 128/70   Good control

## 2012-12-31 NOTE — Assessment & Plan Note (Signed)
No problems with statin Labs next time

## 2012-12-31 NOTE — Assessment & Plan Note (Signed)
Discussed working more on fitness Needs to lose more weight Will need basal insulin if gets worse

## 2012-12-31 NOTE — Addendum Note (Signed)
Addended by: Sueanne Margarita on: 12/31/2012 11:52 AM   Modules accepted: Orders

## 2013-01-13 DIAGNOSIS — E119 Type 2 diabetes mellitus without complications: Secondary | ICD-10-CM | POA: Diagnosis not present

## 2013-01-13 DIAGNOSIS — H4011X0 Primary open-angle glaucoma, stage unspecified: Secondary | ICD-10-CM | POA: Diagnosis not present

## 2013-02-03 ENCOUNTER — Encounter: Payer: Self-pay | Admitting: Internal Medicine

## 2013-02-03 ENCOUNTER — Ambulatory Visit (INDEPENDENT_AMBULATORY_CARE_PROVIDER_SITE_OTHER): Payer: Medicare Other | Admitting: Internal Medicine

## 2013-02-03 VITALS — BP 148/80 | HR 90 | Temp 97.8°F | Wt 241.0 lb

## 2013-02-03 DIAGNOSIS — K30 Functional dyspepsia: Secondary | ICD-10-CM | POA: Insufficient documentation

## 2013-02-03 DIAGNOSIS — K3189 Other diseases of stomach and duodenum: Secondary | ICD-10-CM | POA: Diagnosis not present

## 2013-02-03 NOTE — Patient Instructions (Signed)
Please try omeprazole (prilosec) 20mg  daily on an empty stomach (available over the counter). If you are not better within 1-2 weeks, call and I will set up an ultrasound to look at your gallbladder. If you are better, you can probably stop the omeprazole after 1 month, and only use it again if your symptoms start acting up.

## 2013-02-03 NOTE — Assessment & Plan Note (Signed)
Fairly vague symptoms but not really pain Post prandial and up into throat at times No worrisome features Most likely acid reflux---no past history or clear risks  Lower chance of gallbladder problems  If no better, will check RUQ ultrasound Then consider GI eval if still no answers

## 2013-02-03 NOTE — Progress Notes (Signed)
Subjective:    Patient ID: Lucas [name], male    DOB: [birth year], 72 y.o.   MRN: [id number]  HPI Has been having some stomach trouble-- intermittent for a couple of months Forgot to mention it at the last visit Gets nausea Feeling in throat "like everything is going to come back up"--this was just once Does have other throat symptoms though Has cough and some mucus No voice changes  Usually after eating--some nausea No swallowing problems No past GERD  Hasn't tried any meds No NSAIDs No alcohol 1.5 cups of coffee in the morning  Current Outpatient Prescriptions on File Prior to Visit  Medication Sig Dispense Refill  . albuterol (VENTOLIN HFA) 108 (90 BASE) MCG/ACT inhaler Inhale 2 puffs into the lungs every 4 (four) hours as needed.        Marland Kitchen aspirin 81 MG tablet Take 81 mg by mouth daily.        . brimonidine (ALPHAGAN) 0.2 % ophthalmic solution Place 1 drop into both eyes 2 (two) times daily.       . cetirizine (ZYRTEC) 10 MG tablet Take 10 mg by mouth daily.      Marland Kitchen glipiZIDE (GLUCOTROL XL) 5 MG 24 hr tablet Take 1 tablet (5 mg total) by mouth daily.  30 tablet  11  . latanoprost (XALATAN) 0.005 % ophthalmic solution Place 1 drop into both eyes at bedtime.       Marland Kitchen lisinopril (PRINIVIL,ZESTRIL) 20 MG tablet Take 20 mg by mouth daily.        . metFORMIN (GLUCOPHAGE) 1000 MG tablet Take 1,000 mg by mouth 2 (two) times daily with a meal.        . Multiple Vitamin (MULTIVITAMIN) tablet Take 1 tablet by mouth daily.        . pravastatin (PRAVACHOL) 20 MG tablet Take 10 mg by mouth daily.       . sitaGLIPtin (JANUVIA) 50 MG tablet Take 1 tablet (50 mg total) by mouth daily.  90 tablet  3  . VIAGRA 50 MG tablet Take 50 mg by mouth as needed.        No current facility-administered medications on file prior to visit.    No Known Allergies  Past Medical History  Diagnosis Date  . Allergic rhinitis   . Diabetes mellitus type II   . Diverticulosis of colon   . HLD  (hyperlipidemia)   . HTN (hypertension)   . Glaucoma   . Osteoarthritis   . History of agent Orange exposure   . Hypertrophy of prostate without urinary obstruction and other lower urinary tract symptoms (LUTS)     No past surgical history on file.  Family History  Problem Relation Age of Onset  . Colon cancer Father   . Stroke Mother   . Heart attack Mother   . Hypertension      several siblings  . Diabetes      several siblings  . Coronary artery disease Mother   . Coronary artery disease Brother     History   Social History  . Marital Status: Married    Spouse Name: N/A    Number of Children: N/A  . Years of Education: N/A   Occupational History  . Retired-[employer]    Social History Main Topics  . Smoking status: Never Smoker   . Smokeless tobacco: Never Used  . Alcohol Use: Yes     Comment: Rare  . Drug Use: No  . Sexual Activity: Not on file  Other Topics Concern  . Not on file   Social History Narrative   [place] vet- 20% disability from shrapnel wounds 1967 and 20% agent orange   Married (Widowed 1996; remarried 6/03)   Retired [employer]      Has living will   Wife, then daughter Billie Lade will be health care POA   Would want attempts at resuscitation   Would not want feeding tube if cognitively unaware   Review of Systems Appetite is off---has to "make myself eat" Weight is down 4# Bowels are fairly regular. No blood in them    Objective:   Physical Exam  Constitutional: He appears well-developed and well-nourished. No distress.  Neck: Normal range of motion. Neck supple. No thyromegaly present.  Cardiovascular: Normal rate, regular rhythm and normal heart sounds.  Exam reveals no gallop.   No murmur heard. Pulmonary/Chest: Effort normal and breath sounds normal. No respiratory distress. He has no wheezes. He has no rales.  Abdominal: Soft. Bowel sounds are normal. He exhibits no distension and no mass. There is no tenderness. There is no rebound and no  guarding.  Musculoskeletal: He exhibits no edema.  Lymphadenopathy:    He has no cervical adenopathy.  Psychiatric: He has a normal mood and affect. His behavior is normal.          Assessment & Plan:

## 2013-02-26 ENCOUNTER — Other Ambulatory Visit: Payer: Self-pay | Admitting: Internal Medicine

## 2013-04-14 DIAGNOSIS — H401290 Low-tension glaucoma, unspecified eye, stage unspecified: Secondary | ICD-10-CM | POA: Diagnosis not present

## 2013-04-14 DIAGNOSIS — E119 Type 2 diabetes mellitus without complications: Secondary | ICD-10-CM | POA: Diagnosis not present

## 2013-04-14 LAB — HM DIABETES EYE EXAM

## 2013-07-14 ENCOUNTER — Encounter: Payer: Self-pay | Admitting: Internal Medicine

## 2013-07-14 ENCOUNTER — Ambulatory Visit (INDEPENDENT_AMBULATORY_CARE_PROVIDER_SITE_OTHER): Payer: Medicare Other | Admitting: Internal Medicine

## 2013-07-14 VITALS — BP 128/80 | HR 89 | Temp 98.4°F | Ht 72.0 in | Wt 241.0 lb

## 2013-07-14 DIAGNOSIS — E785 Hyperlipidemia, unspecified: Secondary | ICD-10-CM | POA: Diagnosis not present

## 2013-07-14 DIAGNOSIS — Z0000 Encounter for general adult medical examination without abnormal findings: Secondary | ICD-10-CM

## 2013-07-14 DIAGNOSIS — E119 Type 2 diabetes mellitus without complications: Secondary | ICD-10-CM | POA: Diagnosis not present

## 2013-07-14 DIAGNOSIS — I10 Essential (primary) hypertension: Secondary | ICD-10-CM

## 2013-07-14 DIAGNOSIS — N400 Enlarged prostate without lower urinary tract symptoms: Secondary | ICD-10-CM

## 2013-07-14 LAB — CBC WITH DIFFERENTIAL/PLATELET
Basophils Absolute: 0 10*3/uL (ref 0.0–0.1)
Basophils Relative: 0.7 % (ref 0.0–3.0)
Eosinophils Absolute: 0.2 10*3/uL (ref 0.0–0.7)
Eosinophils Relative: 4.4 % (ref 0.0–5.0)
HCT: 41.2 % (ref 39.0–52.0)
Hemoglobin: 13.7 g/dL (ref 13.0–17.0)
Lymphocytes Relative: 44.7 % (ref 12.0–46.0)
Lymphs Abs: 1.8 10*3/uL (ref 0.7–4.0)
MCHC: 33.2 g/dL (ref 30.0–36.0)
MCV: 84.6 fl (ref 78.0–100.0)
Monocytes Absolute: 0.3 10*3/uL (ref 0.1–1.0)
Monocytes Relative: 7.1 % (ref 3.0–12.0)
Neutro Abs: 1.8 10*3/uL (ref 1.4–7.7)
Neutrophils Relative %: 43.1 % (ref 43.0–77.0)
Platelets: 212 10*3/uL (ref 150.0–400.0)
RBC: 4.87 Mil/uL (ref 4.22–5.81)
RDW: 14.9 % — ABNORMAL HIGH (ref 11.5–14.6)
WBC: 4.1 10*3/uL — ABNORMAL LOW (ref 4.5–10.5)

## 2013-07-14 LAB — COMPREHENSIVE METABOLIC PANEL
ALT: 45 U/L (ref 0–53)
AST: 50 U/L — ABNORMAL HIGH (ref 0–37)
Albumin: 4.1 g/dL (ref 3.5–5.2)
Alkaline Phosphatase: 28 U/L — ABNORMAL LOW (ref 39–117)
BUN: 17 mg/dL (ref 6–23)
CO2: 29 mEq/L (ref 19–32)
Calcium: 9.5 mg/dL (ref 8.4–10.5)
Chloride: 101 mEq/L (ref 96–112)
Creatinine, Ser: 1 mg/dL (ref 0.4–1.5)
GFR: 97.97 mL/min (ref >60.00)
Glucose, Bld: 163 mg/dL — ABNORMAL HIGH (ref 70–99)
Potassium: 4.8 mEq/L (ref 3.5–5.1)
Sodium: 136 mEq/L (ref 135–145)
Total Bilirubin: 0.4 mg/dL (ref 0.3–1.2)
Total Protein: 7.1 g/dL (ref 6.0–8.3)

## 2013-07-14 LAB — LIPID PANEL
Cholesterol: 171 mg/dL (ref 0–200)
HDL: 35.1 mg/dL — ABNORMAL LOW (ref >39.00)
LDL Cholesterol: 68 mg/dL (ref 0–99)
Total CHOL/HDL Ratio: 5
Triglycerides: 342 mg/dL — ABNORMAL HIGH (ref 0.0–149.0)
VLDL: 68.4 mg/dL — ABNORMAL HIGH (ref 0.0–40.0)

## 2013-07-14 LAB — HM DIABETES FOOT EXAM

## 2013-07-14 LAB — T4, FREE: Free T4: 0.65 ng/dL (ref 0.60–1.60)

## 2013-07-14 LAB — TSH: TSH: 1.85 u[IU]/mL (ref 0.35–5.50)

## 2013-07-14 LAB — HEMOGLOBIN A1C: Hgb A1c MFr Bld: 7.9 % — ABNORMAL HIGH (ref 4.6–6.5)

## 2013-07-14 MED ORDER — SILDENAFIL CITRATE 20 MG PO TABS: 60.0000 mg | ORAL_TABLET | Freq: Every day | ORAL | Status: DC | PRN

## 2013-07-14 NOTE — Progress Notes (Signed)
Subjective:    Patient ID: Lucas [name], male    DOB: 05-14-1940, 73 y.o.   MRN: [id number]  HPI Here for Medicare wellness and follow up Form done Reviewed advanced directives No falls No depression but some degree of anhedonia. Working on more activities, etc to combat this Vision is okay---Rx for glaucoma Left hearing aide---follows at [facility] to exercise---discussed increased weight work No tobacco or alcohol Ongoing sexual problems. Libido is down. Intermittent success with viagra. Discussed (he uses suboptimal dose) Mild memory problems---nothing worrisome  Checks sugars fasting Running some higher in past few months-- up to 160. Rarely checks random in day--were better though No hypoglycemic reactions Some numbness on lateral left foot--only once in a while Some soreness in right great toe---?slight ingrowing  No chest pain No SOB No dizziness or syncope No edema  Voids slowly still Gets some urgency and increased frequency Nocturia x 1  Current Outpatient Prescriptions on File Prior to Visit  Medication Sig Dispense Refill  . albuterol (VENTOLIN HFA) 108 (90 BASE) MCG/ACT inhaler Inhale 2 puffs into the lungs every 4 (four) hours as needed.        Marland Kitchen aspirin 81 MG tablet Take 81 mg by mouth daily.        . brimonidine (ALPHAGAN) 0.2 % ophthalmic solution Place 1 drop into both eyes 2 (two) times daily.       . cetirizine (ZYRTEC) 10 MG tablet Take 10 mg by mouth daily.      Marland Kitchen glipiZIDE (GLUCOTROL XL) 5 MG 24 hr tablet Take 1 tablet (5 mg total) by mouth daily.  30 tablet  11  . JANUVIA 50 MG tablet TAKE 1 TABLET (50 MG TOTAL) BY MOUTH DAILY.  90 tablet  3  . latanoprost (XALATAN) 0.005 % ophthalmic solution Place 1 drop into both eyes at bedtime.       Marland Kitchen lisinopril (PRINIVIL,ZESTRIL) 20 MG tablet Take 20 mg by mouth daily.        . metFORMIN (GLUCOPHAGE) 1000 MG tablet Take 1,000 mg by mouth 2 (two) times daily with a meal.        . Multiple Vitamin  (MULTIVITAMIN) tablet Take 1 tablet by mouth daily.        . pravastatin (PRAVACHOL) 20 MG tablet Take 10 mg by mouth daily.       Marland Kitchen VIAGRA 50 MG tablet Take 50 mg by mouth as needed.        No current facility-administered medications on file prior to visit.    No Known Allergies  Past Medical History  Diagnosis Date  . Allergic rhinitis   . Diabetes mellitus type II   . Diverticulosis of colon   . HLD (hyperlipidemia)   . HTN (hypertension)   . Glaucoma   . Osteoarthritis   . History of agent Orange exposure   . Hypertrophy of prostate without urinary obstruction and other lower urinary tract symptoms (LUTS)     No past surgical history on file.  Family History  Problem Relation Age of Onset  . Colon cancer Father   . Stroke Mother   . Heart attack Mother   . Hypertension      several siblings  . Diabetes      several siblings  . Coronary artery disease Mother   . Coronary artery disease Brother     History   Social History  . Marital Status: Married    Spouse Name: N/A    Number of Children:  N/A  . Years of Education: N/A   Occupational History  . Retired-[employer]    Social History Main Topics  . Smoking status: Never Smoker   . Smokeless tobacco: Never Used  . Alcohol Use: Yes     Comment: Rare  . Drug Use: No  . Sexual Activity: Not on file   Other Topics Concern  . Not on file   Social History Narrative   [place] vet- 20% disability from shrapnel wounds 1967 and 20% agent orange   Married (Widowed 1996; remarried 6/03)   Retired [place] living will   Wife, then daughter Hoyle Barr will be health care POA   Would want attempts at resuscitation   Would not want feeding tube if cognitively unaware   Review of Systems Sleeps fairly well Appetite is fine Weight is stable     Objective:   Physical Exam  Constitutional: He is oriented to person, place, and time. He appears well-developed and well-nourished. No distress.  HENT:  Mouth/Throat:  Oropharynx is clear and moist. No oropharyngeal exudate.  Neck: Normal range of motion. Neck supple. No thyromegaly present.  Cardiovascular: Normal rate, regular rhythm, normal heart sounds and intact distal pulses.  Exam reveals no gallop.   No murmur heard. Pulmonary/Chest: Effort normal and breath sounds normal. No respiratory distress. He has no wheezes. He has no rales.  Abdominal: Soft. There is no tenderness.  Musculoskeletal: He exhibits no edema and no tenderness.  Lymphadenopathy:    He has no cervical adenopathy.  Neurological: He is alert and oriented to person, place, and time.  President-- "Elyn Peers, [name], [name] -- then when prompted--- Reagan" [id number] Recall 1/3  Normal fine touch sensation in feet  Skin: No rash noted. No erythema.  No foot lesions  Psychiatric: He has a normal mood and affect. His behavior is normal.          Assessment & Plan:

## 2013-07-14 NOTE — Assessment & Plan Note (Signed)
BP Readings from Last 3 Encounters:  07/14/13 128/80  02/03/13 148/80  12/31/12 128/70   Good control No change

## 2013-07-14 NOTE — Assessment & Plan Note (Signed)
I have personally reviewed the Medicare Annual Wellness questionnaire and have noted 1. The patient's medical and social history 2. Their use of alcohol, tobacco or illicit drugs 3. Their current medications and supplements 4. The patient's functional ability including ADL's, fall risks, home safety risks and hearing or visual             impairment. 5. Diet and physical activities 6. Evidence for depression or mood disorders  The patients weight, height, BMI and visual acuity have been recorded in the chart I have made referrals, counseling and provided education to the patient based review of the above and I have provided the pt with a written personalized care plan for preventive services.  I have provided you with a copy of your personalized plan for preventive services. Please take the time to review along with your updated medication list.  UTD on colon and immunizations Mild memory issues---not an issue now Working on improving interest in things Decreased libido---discussed

## 2013-07-14 NOTE — Patient Instructions (Signed)
You can call [facility] to have your feet checked.  Diabetes Meal Planning Guide The diabetes meal planning guide is a tool to help you plan your meals and snacks. It is important for people with diabetes to manage their blood glucose (sugar) levels. Choosing the right foods and the right amounts throughout your day will help control your blood glucose. Eating right can even help you improve your blood pressure and reach or maintain a healthy weight. CARBOHYDRATE COUNTING MADE EASY When you eat carbohydrates, they turn to sugar. This raises your blood glucose level. Counting carbohydrates can help you control this level so you feel better. When you plan your meals by counting carbohydrates, you can have more flexibility in what you eat and balance your medicine with your food intake. Carbohydrate counting simply means adding up the total amount of carbohydrate grams in your meals and snacks. Try to eat about the same amount at each meal. Foods with carbohydrates are listed below. Each portion below is 1 carbohydrate serving or 15 grams of carbohydrates. Ask your dietician how many grams of carbohydrates you should eat at each meal or snack. Grains and Starches  1 slice bread.   English muffin or hotdog/hamburger bun.   cup cold cereal (unsweetened).   cup cooked pasta or rice.   cup starchy vegetables (corn, potatoes, peas, beans, winter squash).  1 tortilla (6 inches).   bagel.  1 waffle or pancake (size of a CD).   cup cooked cereal.  4 to 6 small crackers. *Whole grain is recommended. Fruit  1 cup fresh unsweetened berries, melon, papaya, pineapple.  1 small fresh fruit.   banana or mango.   cup fruit juice (4 oz unsweetened).   cup canned fruit in natural juice or water.  2 tbs dried fruit.  12 to 15 grapes or cherries. Milk and Yogurt  1 cup fat-free or 1% milk.  1 cup soy milk.  6 oz light yogurt with sugar-free sweetener.  6 oz low-fat soy  yogurt.  6 oz plain yogurt. Vegetables  1 cup raw or  cup cooked is counted as 0 carbohydrates or a "free" food.  If you eat 3 or more servings at 1 meal, count them as 1 carbohydrate serving. Other Carbohydrates   oz chips or pretzels.   cup ice cream or frozen yogurt.   cup sherbet or sorbet.  2 inch square cake, no frosting.  1 tbs honey, sugar, jam, jelly, or syrup.  2 small cookies.  3 squares of graham crackers.  3 cups popcorn.  6 crackers.  1 cup broth-based soup.  Count 1 cup casserole or other mixed foods as 2 carbohydrate servings.  Foods with less than 20 calories in a serving may be counted as 0 carbohydrates or a "free" food. You may want to purchase a book or computer software that lists the carbohydrate gram counts of different foods. In addition, the nutrition facts panel on the labels of the foods you eat are a good source of this information. The label will tell you how big the serving size is and the total number of carbohydrate grams you will be eating per serving. Divide this number by 15 to obtain the number of carbohydrate servings in a portion. Remember, 1 carbohydrate serving equals 15 grams of carbohydrate. SERVING SIZES Measuring foods and serving sizes helps you make sure you are getting the right amount of food. The list below tells how big or small some common serving sizes are.  1  oz.........4 stacked dice.  3 oz........Marland KitchenDeck of cards.  1 tsp.......Marland KitchenTip of little finger.  1 tbs......Marland KitchenMarland KitchenThumb.  2 tbs.......Marland KitchenGolf ball.   cup......Marland KitchenHalf of a fist.  1 cup.......Marland KitchenA fist. SAMPLE DIABETES MEAL PLAN Below is a sample meal plan that includes foods from the grain and starches, dairy, vegetable, fruit, and meat groups. A dietician can individualize a meal plan to fit your calorie needs and tell you the number of servings needed from each food group. However, controlling the total amount of carbohydrates in your meal or snack is more  important than making sure you include all of the food groups at every meal. You may interchange carbohydrate containing foods (dairy, starches, and fruits). The meal plan below is an example of a 2000 calorie diet using carbohydrate counting. This meal plan has 17 carbohydrate servings. Breakfast  1 cup oatmeal (2 carb servings).   cup light yogurt (1 carb serving).  1 cup blueberries (1 carb serving).   cup almonds. Snack  1 large apple (2 carb servings).  1 low-fat string cheese stick. Lunch  Chicken breast salad.  1 cup spinach.   cup chopped tomatoes.  2 oz chicken breast, sliced.  2 tbs low-fat [place] dressing.  12 whole-wheat crackers (2 carb servings).  12 to 15 grapes (1 carb serving).  1 cup low-fat milk (1 carb serving). Snack  1 cup carrots.   cup hummus (1 carb serving). Dinner  3 oz broiled salmon.  1 cup [place] rice (3 carb servings). Snack  1  cups steamed broccoli (1 carb serving) drizzled with 1 tsp olive oil and lemon juice.  1 cup light pudding (2 carb servings). DIABETES MEAL PLANNING WORKSHEET Your dietician can use this worksheet to help you decide how many servings of foods and what types of foods are right for you.  BREAKFAST Food Group and Servings / Carb Servings Grain/Starches __________________________________ Dairy __________________________________________ Vegetable ______________________________________ Fruit ___________________________________________ Meat __________________________________________ Fat ____________________________________________ LUNCH Food Group and Servings / Carb Servings Grain/Starches ___________________________________ Dairy ___________________________________________ Fruit ____________________________________________ Meat ___________________________________________ Fat _____________________________________________ Wonda Cheng Food Group and Servings / Carb Servings Grain/Starches  ___________________________________ Dairy ___________________________________________ Fruit ____________________________________________ Meat ___________________________________________ Fat _____________________________________________ SNACKS Food Group and Servings / Carb Servings Grain/Starches ___________________________________ Dairy ___________________________________________ Vegetable _______________________________________ Fruit ____________________________________________ Meat ___________________________________________ Fat _____________________________________________ DAILY TOTALS Starches _________________________ Vegetable ________________________ Fruit ____________________________ Dairy ____________________________ Meat ____________________________ Fat ______________________________ Document Released: 12/22/2004 Document Revised: 06/19/2011 Document Reviewed: 11/02/2008 Exit[employer] Patient Information 2014 [employer].

## 2013-07-14 NOTE — Progress Notes (Signed)
Pre visit review using our clinic review tool, if applicable. No additional management support is needed unless otherwise documented below in the visit note. 

## 2013-07-14 NOTE — Assessment & Plan Note (Signed)
Bothers him but not enough for meds If worsens, will try tamsulosin

## 2013-07-14 NOTE — Assessment & Plan Note (Signed)
No problems with statin 

## 2013-07-14 NOTE — Assessment & Plan Note (Signed)
Lab Results  Component Value Date   HGBA1C 7.6* 12/31/2012   Seems to be higher Will recheck ?increase [place]

## 2013-07-15 ENCOUNTER — Telehealth: Payer: Self-pay | Admitting: Internal Medicine

## 2013-07-15 DIAGNOSIS — H2510 Age-related nuclear cataract, unspecified eye: Secondary | ICD-10-CM | POA: Diagnosis not present

## 2013-07-15 DIAGNOSIS — H4011X0 Primary open-angle glaucoma, stage unspecified: Secondary | ICD-10-CM | POA: Diagnosis not present

## 2013-07-15 LAB — HM DIABETES EYE EXAM

## 2013-07-15 NOTE — Telephone Encounter (Signed)
Relevant patient education assigned to patient using Emmi. ° °

## 2013-07-23 ENCOUNTER — Encounter: Payer: Self-pay | Admitting: Internal Medicine

## 2013-07-28 ENCOUNTER — Telehealth: Payer: Self-pay

## 2013-07-28 NOTE — Telephone Encounter (Signed)
Relevant patient education assigned to patient using Emmi. ° °

## 2013-08-13 ENCOUNTER — Encounter: Payer: Self-pay | Admitting: Podiatry

## 2013-08-13 ENCOUNTER — Ambulatory Visit (INDEPENDENT_AMBULATORY_CARE_PROVIDER_SITE_OTHER): Payer: Medicare Other | Admitting: Podiatry

## 2013-08-13 ENCOUNTER — Ambulatory Visit (INDEPENDENT_AMBULATORY_CARE_PROVIDER_SITE_OTHER): Payer: Medicare Other

## 2013-08-13 VITALS — BP 122/65 | HR 87 | Resp 16 | Ht 72.0 in | Wt 235.0 lb

## 2013-08-13 DIAGNOSIS — M79609 Pain in unspecified limb: Secondary | ICD-10-CM

## 2013-08-13 DIAGNOSIS — L600 Ingrowing nail: Secondary | ICD-10-CM | POA: Diagnosis not present

## 2013-08-13 DIAGNOSIS — E119 Type 2 diabetes mellitus without complications: Secondary | ICD-10-CM

## 2013-08-13 DIAGNOSIS — M79673 Pain in unspecified foot: Secondary | ICD-10-CM

## 2013-08-13 MED ORDER — NEOMYCIN-POLYMYXIN-HC 3.5-10000-1 OT SOLN: OTIC | Status: DC

## 2013-08-13 NOTE — Patient Instructions (Signed)

## 2013-08-13 NOTE — Progress Notes (Signed)
   Subjective:    Patient ID: Lucas [name], male    DOB: [birth year], 73 y.o.   MRN: [id number]  HPI Comments: i have a irration on my great toenail on my right foot. It hurts when i touch it. Its been this way for 1 month. The nail is getting better. i had a pedicure and i trim my toenails as well.     Review of Systems  Constitutional: Negative.   HENT:       Sinus problems Hearing loss Ringing in ears   Eyes: Negative.   Respiratory: Negative.   Cardiovascular: Negative.   Gastrointestinal: Negative.   Endocrine: Negative.   Genitourinary: Positive for frequency.  Musculoskeletal: Negative.   Skin:       Change in nails  Allergic/Immunologic: Negative.   Neurological: Negative.   Hematological: Negative.   Psychiatric/Behavioral: Negative.        Objective:   Physical Exam: I have reviewed his past medical history medications allergies surgeries social history and review of systems. Pulses are palpable bilateral. Neurologic sensorium is slightly decreased per since once the monofilament bilateral. Deep tendon reflexes are intact bilateral. Muscle strength is 5 over 5 dorsiflexors plantar flexors inverters everters all his musculature is intact. Orthopedic evaluation demonstrates all joints distal to the ankle a full range of motion without crepitation. Cutaneous evaluation does demonstrate sharp incurvated nail margin along the tibial border of the hallux right. Mild erythema and painful palpation. Radiographic evaluation bilateral foot does not demonstrate anything of a rectus foot bilateral.          Assessment & Plan:  Assessment: Diabetes with early diabetic peripheral neuropathy. Ingrown toenail paronychia abscess hallux right.  Plan: Discussed etiology pathology conservative versus surgical therapies at this point we decided to perform a chemical matrixectomy to the tibial border he tolerated this procedure well with local anesthetic. He will followup with me in  one week. He will continue to soak twice daily Betadine and water and apply Cortisporin Otic as directed.

## 2013-08-21 ENCOUNTER — Ambulatory Visit (INDEPENDENT_AMBULATORY_CARE_PROVIDER_SITE_OTHER): Payer: Medicare Other | Admitting: Podiatry

## 2013-08-21 VITALS — BP 141/75 | HR 96 | Resp 16

## 2013-08-21 DIAGNOSIS — L600 Ingrowing nail: Secondary | ICD-10-CM

## 2013-08-21 DIAGNOSIS — E119 Type 2 diabetes mellitus without complications: Secondary | ICD-10-CM

## 2013-08-21 NOTE — Progress Notes (Signed)
He presents today for followup of her matrixectomy hallux right. He denies fever chills nausea vomiting muscle aches and pains. States it is doing quite well soaking in Betadine and water.  Objective: Vital signs are stable he is alert and oriented x3. There is no erythema edema saline is drainage or odor. This appears to be granulating in with epithelialization quite nicely. I see no signs of infection.  Assessment: Well-healing surgical toe hallux right.  Plan: Discontinue Betadine use. Start with Epsom salts and water soaks continue Cortisporin Otic twice daily. Cover during the day and leave open at night. Continue to soak until completely healed and I will followup with him as needed.

## 2013-10-14 DIAGNOSIS — H401290 Low-tension glaucoma, unspecified eye, stage unspecified: Secondary | ICD-10-CM | POA: Diagnosis not present

## 2013-10-29 ENCOUNTER — Encounter: Payer: Self-pay | Admitting: Family Medicine

## 2013-10-29 ENCOUNTER — Ambulatory Visit (INDEPENDENT_AMBULATORY_CARE_PROVIDER_SITE_OTHER): Payer: Medicare Other | Admitting: Family Medicine

## 2013-10-29 VITALS — BP 104/74 | HR 84 | Temp 98.1°F | Ht 72.0 in | Wt 244.5 lb

## 2013-10-29 DIAGNOSIS — M5432 Sciatica, left side: Secondary | ICD-10-CM

## 2013-10-29 DIAGNOSIS — G5700 Lesion of sciatic nerve, unspecified lower limb: Secondary | ICD-10-CM | POA: Diagnosis not present

## 2013-10-29 DIAGNOSIS — M5430 Sciatica, unspecified side: Secondary | ICD-10-CM

## 2013-10-29 DIAGNOSIS — G5702 Lesion of sciatic nerve, left lower limb: Secondary | ICD-10-CM

## 2013-10-29 NOTE — Progress Notes (Signed)
[place] [address] Phone: [phone] Fax: [phone]  Patient ID: Lucas [name] MRN: [id number], DOB: 02/16/1941, 73 y.o. Date of Encounter: 10/29/2013  Primary Physician:  Viviana Simpler, MD   Chief Complaint: Hip Pain   Subjective:   History of Present Illness:  Lucas [name] is a 73 y.o. very pleasant male patient who presents with the following:  L posterior buttocks and back and pain radiating down his leg. He describes this as his hip, but he has not having any groin pain, and all of his pain is in his left buttock. He is having pain in the buttocks region he is also have pain radiating down his left posterior aspect of his leg. He is also having some tingling sensation and some decreased sensation on the lateral aspect around the fifth metatarsal on the left foot. This is intermittent in character.  He has not had any kind of trauma.  About a week ago he had to push his brother who weighs 240 pounds around the [facility], and this seemed to aggravate it quite a bit. He also noticed that he got quite a bit worse 2 or 3 weeks ago when he had to drive greater than 1497 miles in a couple of days.  Monday, bothered him more.   Past Medical History, Surgical History, Social History, Family History, Problem List, Medications, and Allergies have been reviewed and updated if relevant.  Review of Systems:  GEN: No fevers, chills. Nontoxic. Primarily MSK c/o today. MSK: Detailed in the HPI GI: tolerating PO intake without difficulty Neuro: as above. Otherwise the pertinent positives of the ROS are noted above.   Objective:   Physical Examination: BP 104/74  Pulse 84  Temp(Src) 98.1 F (36.7 C) (Oral)  Ht 6' (1.829 m)  Wt 244 lb 8 oz (110.904 kg)  BMI 33.15 kg/m2   GEN: WDWN, NAD, Non-toxic, Alert & Oriented x 3 HEENT: Atraumatic, Normocephalic.  Ears and Nose: No external deformity. EXTR: No clubbing/cyanosis/edema NEURO: Normal gait.    PSYCH: Normally interactive. Conversant. Not depressed or anxious appearing.  Calm demeanor.   HIP EXAM: SIDE: B ROM: Abduction, Flexion, Internal and External range of motion: full Pain with terminal IROM and EROM: NT GTB: NT SLR: NEG Knees: No effusion FABER: NT REVERSE FABER: POS Piriformis: MARKEDLY TENDER ON THE LEFT AND MY PALPATION INDUCES RADICULOPATHY Str: flexion: 5/5 abduction: 5/5 adduction: 5/5 Strength testing non-tender  Range of motion at  the waist: Flexion: normal Extension: normal Lateral bending: normal Rotation: all normal  No echymosis or edema Rises to examination table with no difficulty Gait: non antalgic  Inspection/Deformity: N Paraspinus Tenderness: none  B Ankle Dorsiflexion (L5,4): 5/5 B Great Toe Dorsiflexion (L5,4): 5/5 Heel Walk (L5): WNL Toe Walk (S1): WNL Rise/Squat (L4): WNL  SENSORY B Medial Foot (L4): WNL B Dorsum (L5): WNL B Lateral (S1): WNL Light Touch: WNL Pinprick: WNL  REFLEXES Knee (L4): 2+ Ankle (S1): 2+  B Sciatic Notch: NT   Radiology: No results found.  Assessment & Plan:   Piriformis syndrome of left side  Sciatica, left  >25 minutes spent in face to face time with patient, >50% spent in counselling or coordination of care: classic presentation, reviewed anatomy, hip and spine, piriformis, reviewed rehab and treatment.   Seems very, very likely piriformis, could not fully exclude spinal origin. Weight loss.  Patient Instructions  PIRIFORMIS SYNDROME REHAB 1. Work on pretzel stretching, shoulder back and leg draped in front.  3-5 sets, 30 sec.. 2. hip abductor rotations. standing, hip flexion and rotation outward then inward. 3 sets, 15 reps. when can do comfortably, add ankle weights starting at 2 pounds.  3. SINK STRETCH - YOU CAN DO THIS WHENEVER YOU WANT DURING THE DAY  Tennis ball underneath area in buttocks - on a hard surface underneath Can also massage this area with an electronic massager  or hand     Follow-up: No Follow-up on file. Unless noted above, the patient is to follow-up if symptoms worsen. Red flags were reviewed with the patient.  Signed,  Lucas Deed. Arayna Illescas, MD, [facility]   Discontinued Medications   NEOMYCIN-POLYMYXIN-HYDROCORTISONE (CORTISPORIN) OTIC SOLUTION    Apply one to two drops to the toe after soaking each day   VIAGRA 50 MG TABLET    Take 50 mg by mouth as needed.    Current Medications at Discharge:   Medication List       This list is accurate as of: 10/29/13  5:53 PM.  Always use your most recent med list.               aspirin 81 MG tablet  Take 81 mg by mouth daily.     brimonidine 0.2 % ophthalmic solution  Commonly known as:  ALPHAGAN  Place 1 drop into both eyes 2 (two) times daily.     cetirizine 10 MG tablet  Commonly known as:  ZYRTEC  Take 10 mg by mouth daily.     glipiZIDE 5 MG 24 hr tablet  Commonly known as:  GLUCOTROL XL  Take 1 tablet (5 mg total) by mouth daily.     JANUVIA 50 MG tablet  Generic drug:  sitaGLIPtin  TAKE 1 TABLET (50 MG TOTAL) BY MOUTH DAILY.     latanoprost 0.005 % ophthalmic solution  Commonly known as:  XALATAN  Place 1 drop into both eyes at bedtime.     lisinopril 20 MG tablet  Commonly known as:  PRINIVIL,ZESTRIL  Take 20 mg by mouth daily.     metFORMIN 1000 MG tablet  Commonly known as:  GLUCOPHAGE  Take 1,000 mg by mouth 2 (two) times daily with a meal.     multivitamin tablet  Take 1 tablet by mouth daily.     pravastatin 20 MG tablet  Commonly known as:  PRAVACHOL  Take 10 mg by mouth daily.     sildenafil 20 MG tablet  Commonly known as:  REVATIO  Take 3-5 tablets (60-100 mg total) by mouth daily as needed.     VENTOLIN HFA 108 (90 BASE) MCG/ACT inhaler  Generic drug:  albuterol  Inhale 2 puffs into the lungs every 4 (four) hours as needed.

## 2013-10-29 NOTE — Progress Notes (Signed)
Pre visit review using our clinic review tool, if applicable. No additional management support is needed unless otherwise documented below in the visit note. 

## 2013-10-29 NOTE — Patient Instructions (Signed)
PIRIFORMIS SYNDROME REHAB 1. Work on pretzel stretching, shoulder back and leg draped in front. 3-5 sets, 30 sec.. 2. hip abductor rotations. standing, hip flexion and rotation outward then inward. 3 sets, 15 reps. when can do comfortably, add ankle weights starting at 2 pounds.  3. SINK STRETCH - YOU CAN DO THIS WHENEVER YOU WANT DURING THE DAY  Tennis ball underneath area in buttocks - on a hard surface underneath Can also massage this area with an Counsellor or hand

## 2014-01-13 ENCOUNTER — Other Ambulatory Visit: Payer: Self-pay | Admitting: Internal Medicine

## 2014-01-13 ENCOUNTER — Ambulatory Visit (INDEPENDENT_AMBULATORY_CARE_PROVIDER_SITE_OTHER): Payer: Medicare Other | Admitting: Internal Medicine

## 2014-01-13 ENCOUNTER — Encounter: Payer: Self-pay | Admitting: Internal Medicine

## 2014-01-13 VITALS — BP 110/78 | HR 96 | Temp 98.9°F | Resp 16 | Ht 72.0 in | Wt 237.5 lb

## 2014-01-13 DIAGNOSIS — Z23 Encounter for immunization: Secondary | ICD-10-CM | POA: Diagnosis not present

## 2014-01-13 DIAGNOSIS — N400 Enlarged prostate without lower urinary tract symptoms: Secondary | ICD-10-CM

## 2014-01-13 DIAGNOSIS — I10 Essential (primary) hypertension: Secondary | ICD-10-CM | POA: Diagnosis not present

## 2014-01-13 DIAGNOSIS — E119 Type 2 diabetes mellitus without complications: Secondary | ICD-10-CM | POA: Diagnosis not present

## 2014-01-13 DIAGNOSIS — E785 Hyperlipidemia, unspecified: Secondary | ICD-10-CM | POA: Diagnosis not present

## 2014-01-13 LAB — HEMOGLOBIN A1C: Hgb A1c MFr Bld: 8.7 % — ABNORMAL HIGH (ref 4.6–6.5)

## 2014-01-13 MED ORDER — SITAGLIPTIN PHOSPHATE 100 MG PO TABS: 100.0000 mg | ORAL_TABLET | Freq: Every day | ORAL | Status: DC

## 2014-01-13 NOTE — Progress Notes (Signed)
Pre visit review using our clinic review tool, if applicable. No additional management support is needed unless otherwise documented below in the visit note. 

## 2014-01-13 NOTE — Assessment & Plan Note (Signed)
BP Readings from Last 3 Encounters:  01/13/14 110/78  10/29/13 104/74  08/21/13 141/75   Good control

## 2014-01-13 NOTE — Assessment & Plan Note (Signed)
Not great control in AM If over 8%, will increase januvia Could do well with lantus--he prefers not to

## 2014-01-13 NOTE — Assessment & Plan Note (Signed)
Ongoing symptoms but doing okay without meds for now

## 2014-01-13 NOTE — Progress Notes (Signed)
Subjective:    Patient ID: Lucas [name], male    DOB: Aug 02, 1940, 73 y.o.   MRN: [id number]  HPI Doing okay Wife is concerned about his heart health--- brother with another stent recently and another brother passed from CAD  Walks every day--does 2 miles within 40 minutes. No chest pain No change in exercise tolerance--even if he walks where there are hills Breathing is fine No dizziness or syncope No edema  Still with nocturia-- usually once. Hard to get back to sleep Some urgency and needs to go frequently in day Tries to drink a lot of water  Tries to be careful with his eating--small evening meal now Checks sugars in AM Usually 170-180, but can be down in 130's No hypoglycemic spells  Current Outpatient Prescriptions on File Prior to Visit  Medication Sig Dispense Refill  . albuterol (VENTOLIN HFA) 108 (90 BASE) MCG/ACT inhaler Inhale 2 puffs into the lungs every 4 (four) hours as needed.        Marland Kitchen aspirin 81 MG tablet Take 81 mg by mouth daily.        . brimonidine (ALPHAGAN) 0.2 % ophthalmic solution Place 1 drop into both eyes 2 (two) times daily.       . cetirizine (ZYRTEC) 10 MG tablet Take 10 mg by mouth daily.      Marland Kitchen glipiZIDE (GLUCOTROL XL) 5 MG 24 hr tablet Take 1 tablet (5 mg total) by mouth daily.  30 tablet  11  . JANUVIA 50 MG tablet TAKE 1 TABLET (50 MG TOTAL) BY MOUTH DAILY.  90 tablet  3  . latanoprost (XALATAN) 0.005 % ophthalmic solution Place 1 drop into both eyes at bedtime.       Marland Kitchen lisinopril (PRINIVIL,ZESTRIL) 20 MG tablet Take 20 mg by mouth daily.        . metFORMIN (GLUCOPHAGE) 1000 MG tablet Take 1,000 mg by mouth 2 (two) times daily with a meal.        . Multiple Vitamin (MULTIVITAMIN) tablet Take 1 tablet by mouth daily.        . pravastatin (PRAVACHOL) 20 MG tablet Take 10 mg by mouth daily.       . sildenafil (REVATIO) 20 MG tablet Take 3-5 tablets (60-100 mg total) by mouth daily as needed.  50 tablet  11   No current facility-administered  medications on file prior to visit.    No Known Allergies  Past Medical History  Diagnosis Date  . Allergic rhinitis   . Diabetes mellitus type II   . Diverticulosis of colon   . HLD (hyperlipidemia)   . HTN (hypertension)   . Glaucoma   . Osteoarthritis   . History of agent Orange exposure   . Hypertrophy of prostate without urinary obstruction and other lower urinary tract symptoms (LUTS)     No past surgical history on file.  Family History  Problem Relation Age of Onset  . Colon cancer Father   . Stroke Mother   . Heart attack Mother   . Hypertension      several siblings  . Diabetes      several siblings  . Coronary artery disease Mother   . Coronary artery disease Brother     History   Social History  . Marital Status: Married    Spouse Name: N/A    Number of Children: N/A  . Years of Education: N/A   Occupational History  . Retired-[employer]    Social History Main Topics  .  Smoking status: Never Smoker   . Smokeless tobacco: Never Used  . Alcohol Use: Yes     Comment: Rare  . Drug Use: No  . Sexual Activity: Not on file   Other Topics Concern  . Not on file   Social History Narrative   [place] vet- 20% disability from shrapnel wounds 1967 and 20% agent orange   Married (Widowed 1996; remarried 6/03)   Retired [employer] living will   Wife, then daughter Hoyle Barr will be health care POA   Would want attempts at resuscitation   Would not want feeding tube if cognitively unaware   Review of Systems Left side still feels weaker---relates to the pain syndrome. Sleeps okay--but trouble after getting up to void Had gotten into a funk this winter--lost interest in things. Working hard to do more things and stay involved    Objective:   Physical Exam  Constitutional: He appears well-developed and well-nourished. No distress.  Neck: Normal range of motion. Neck supple. No thyromegaly present.  Cardiovascular: Normal rate, regular rhythm, normal heart  sounds and intact distal pulses.  Exam reveals no gallop.   No murmur heard. Pulmonary/Chest: Effort normal and breath sounds normal. No respiratory distress. He has no wheezes. He has no rales.  Musculoskeletal: He exhibits no edema and no tenderness.  Lymphadenopathy:    He has no cervical adenopathy.  Skin:  No foot lesions  Psychiatric: He has a normal mood and affect. His behavior is normal.          Assessment & Plan:

## 2014-01-13 NOTE — Assessment & Plan Note (Signed)
No problems with statin Lab Results  Component Value Date   LDLCALC 68 07/14/2013

## 2014-01-14 ENCOUNTER — Telehealth: Payer: Self-pay | Admitting: Internal Medicine

## 2014-01-14 DIAGNOSIS — T1511XA Foreign body in conjunctival sac, right eye, initial encounter: Secondary | ICD-10-CM | POA: Diagnosis not present

## 2014-01-14 DIAGNOSIS — H401233 Low-tension glaucoma, bilateral, severe stage: Secondary | ICD-10-CM | POA: Diagnosis not present

## 2014-01-14 NOTE — Telephone Encounter (Signed)
emmi emailed °

## 2014-03-19 ENCOUNTER — Ambulatory Visit (INDEPENDENT_AMBULATORY_CARE_PROVIDER_SITE_OTHER)
Admission: RE | Admit: 2014-03-19 | Discharge: 2014-03-19 | Disposition: A | Discharge status: Home / Self Care | Payer: Medicare Other | Source: Ambulatory Visit | Attending: Family Medicine | Admitting: Family Medicine

## 2014-03-19 ENCOUNTER — Encounter: Payer: Self-pay | Admitting: Family Medicine

## 2014-03-19 ENCOUNTER — Ambulatory Visit (INDEPENDENT_AMBULATORY_CARE_PROVIDER_SITE_OTHER): Payer: Medicare Other | Admitting: Family Medicine

## 2014-03-19 VITALS — BP 118/74 | HR 96 | Temp 98.4°F | Ht 72.0 in | Wt 235.8 lb

## 2014-03-19 DIAGNOSIS — R208 Other disturbances of skin sensation: Secondary | ICD-10-CM

## 2014-03-19 DIAGNOSIS — M25562 Pain in left knee: Secondary | ICD-10-CM

## 2014-03-19 DIAGNOSIS — M5441 Lumbago with sciatica, right side: Secondary | ICD-10-CM | POA: Diagnosis not present

## 2014-03-19 DIAGNOSIS — W19XXXA Unspecified fall, initial encounter: Secondary | ICD-10-CM

## 2014-03-19 DIAGNOSIS — M47896 Other spondylosis, lumbar region: Secondary | ICD-10-CM | POA: Diagnosis not present

## 2014-03-19 DIAGNOSIS — M5136 Other intervertebral disc degeneration, lumbar region: Secondary | ICD-10-CM | POA: Diagnosis not present

## 2014-03-19 DIAGNOSIS — R200 Anesthesia of skin: Secondary | ICD-10-CM

## 2014-03-19 MED ORDER — GABAPENTIN 300 MG PO CAPS: 300.0000 mg | ORAL_CAPSULE | Freq: Three times a day (TID) | ORAL | Status: DC

## 2014-03-19 NOTE — Progress Notes (Signed)
Pre visit review using our clinic review tool, if applicable. No additional management support is needed unless otherwise documented below in the visit note. 

## 2014-03-19 NOTE — Patient Instructions (Signed)

## 2014-03-19 NOTE — Progress Notes (Signed)
Dr. Frederico Hamman T. Cason Dabney, MD, [facility] [place], [address] Phone: [phone] Fax: [phone]  03/19/2014  Patient: Lucas [name], MRN: [id number], DOB: May 16, 1940, 73 y.o.  Primary Physician:  Viviana Simpler, MD  Chief Complaint: Numbness; Fall; and Knee Pain   Subjective:   Lucas [name] is a 73 y.o. very pleasant male patient who presents with the following:  I met this gentleman over the summer, and at that point he was having some sciatica type symptoms, and I felt it was most likely secondary to piriformis syndrome.  He did okay for a while, but over the last few weeks he has had persistently worsening pain in his back, buttocks, and he is also had some pain radiating down the posterior aspect of his LEFT leg.  Is also had some numbness in his LEFT foot.  A couple of weeks ago when he was walking he fell significantly.  He does have numbness on that side.  He also does have some pain in his LEFT knee that is relatively dull and aching on the lateral aspect.  He has not had any kind of dramatic trauma that he knows of in his LEFT knee.  No prior significant known histories in the knee.  He is not having an effusion.  His knee is not locking up.  Numbness going down into the lateral aspect of foot. He has fallen a couple of times. Always seems like the left side.   Fell two weeks ago. Walked about five hundred feet.   Also some knee pain on the left, too.   Lateral l le dull  Past Medical History, Surgical History, Social History, Family History, Problem List, Medications, and Allergies have been reviewed and updated if relevant.  GEN: no acute illness or fever CV: No chest pain or shortness of breath MSK: detailed above Neuro: neurological signs are described above ROS O/w per HPI  Objective:   BP 118/74 mmHg  Pulse 96  Temp(Src) 98.4 F (36.9 C) (Oral)  Ht 6' (1.829 m)  Wt 235 lb 12 oz (106.935  kg)  BMI 31.97 kg/m2   GEN: Well-developed,well-nourished,in no acute distress; alert,appropriate and cooperative throughout examination HEENT: Normocephalic and atraumatic without obvious abnormalities. Ears, externally no deformities PULM: Breathing comfortably in no respiratory distress EXT: No clubbing, cyanosis, or edema PSYCH: Normally interactive. Cooperative during the interview. Pleasant. Friendly and conversant. Not anxious or depressed appearing. Normal, full affect.  Range of motion at  the waist: Flexion, extension, lateral bending and rotation: mildly limited  No echymosis or edema Rises to examination table with mild difficulty Gait: minimally antalgic  Inspection/Deformity: N Paraspinus Tenderness: mild L4-S1  B Ankle Dorsiflexion (L5,4): 5/5 B Great Toe Dorsiflexion (L5,4): 5/5 Heel Walk (L5): WNL Toe Walk (S1): WNL Rise/Squat (L4): WNL, mild pain  SENSORY B Medial Foot (L4): WNL B Dorsum (L5): WNL B Lateral (S1): decreased Light Touch: lateral leg L Pinprick: lateral leg L  REFLEXES Knee (L4): 2+ Ankle (S1): 2+  B SLR, seated: neg B SLR, supine: neg B FABER: neg B Reverse FABER: neg B Greater Troch: NT B Log Roll: neg B Stork: NT B Sciatic Notch: NT   Knee:  L Gait: Normal heel toe pattern ROM: 0-125 Effusion: neg Echymosis or edema: none Patellar tendon NT Painful PLICA: neg Patellar grind: negative Medial and lateral patellar facet loading: negative medial and lateral joint lines:NT Mcmurray's neg Flexion-pinch neg Varus and valgus stress:  stable Lachman: neg Ant and Post drawer: neg Hip abduction, IR, ER: WNL Hip flexion str: 5/5 Hip abd: 5/5 Quad: 5/5 VMO atrophy:No Hamstring concentric and eccentric: 5/5   Radiology: Dg Lumbar Spine Complete  03/19/2014   CLINICAL DATA:  Low back pain. Left foot numbness after a fall 2-3 months ago.  EXAM: LUMBAR SPINE - COMPLETE 4+ VIEW  COMPARISON:  None.  FINDINGS: Degenerative disc  disease and spondylosis with loss of disc height especially at L4-5 but also at L3-4 and L5-S1, Schmorl's nodes along multiple sets of endplates most notably U93-2, and 3 mm degenerative posterior subluxation at the L4-5 level. Degenerative facet arthropathy noted at L4-5 and L5-S1. Vacuum disc phenomenon in the lower lumbar spine.  No fracture or acute findings observed.  IMPRESSION: 1. The is a moderate degree of lower lumbar spondylosis and degenerative disc disease. I do not observe a fracture.   Electronically Signed   By: Sherryl Barters M.D.   On: 03/19/2014 16:26     Assessment and Plan:   Right-sided low back pain with right-sided sciatica - Plan: DG Lumbar Spine Complete  Numbness of left foot  Fall, initial encounter  Left knee pain  He has a fairly significant amount of degenerative disc disease and spondyloarthropathy throughout the lumbar spine.  I would suspect that either osteophytes or disc material is encroaching on nerve on the LEFT.  Hopefully we can get him to be asymptomatic using neuropathic pain medications and basic conservative care.  Follow-up: Return in about 2 months (around 05/20/2014).  New Prescriptions   GABAPENTIN (NEURONTIN) 300 MG CAPSULE    Take 1 capsule (300 mg total) by mouth 3 (three) times daily.   Orders Placed This Encounter  Procedures  . DG Lumbar Spine Complete    Signed,  Suzzette Gasparro T. Chardonay Scritchfield, MD  Patient Instructions  Generic Gabapentin Titration Schedule  Generic Gabapentin (generic form of Neurontin) comes in 300 mg tablets or capsules.   You have to titrate your dose slowly to reduce side effects and reduce sedation / sleepiness.    Week               Breakfast  Lunch   Dinner One                 0   0   300 mg Two   336m   0   3018mThree  300105m 300m3m300mg10mr   300mg 83m0mg  69mmg (271ms) Five   600mg (2 55m) 300mg   6016m(2 ta29mSix   600mg (2 tab67m00mg (2 tabs82m0mg (2 tabs)62men  600mg (2 tabs) 93mg  (2 tabs) 82mg (3 tabs) Ei83m  900mg (3 tabs) 60051m2 tabs) 900m88m tabs)  If y66mave any problems at any time, drop back to the previous dosing schedule. Continue with this dose for 1 week, and then try to go up the next step again.       Patient's Medications  New Prescriptions   GABAPENTIN (NEURONTIN) 300 MG CAPSULE    Take 1 capsule (300 mg total) by mouth 3 (three) times daily.  Previous Medications   ALBUTEROL (VENTOLIN HFA) 108 (90 BASE) MCG/ACT INHALER    Inhale 2 puffs into the lungs every 4 (four) hours as needed.     ASPIRIN 81 MG TABLET    Take 81 mg by mouth daily.     BRIMONIDINE (ALPHAGAN) 0.2 %  OPHTHALMIC SOLUTION    Place 1 drop into both eyes 2 (two) times daily.    CETIRIZINE (ZYRTEC) 10 MG TABLET    Take 10 mg by mouth daily.   GLIPIZIDE (GLUCOTROL XL) 5 MG 24 HR TABLET    Take 1 tablet (5 mg total) by mouth daily.   LATANOPROST (XALATAN) 0.005 % OPHTHALMIC SOLUTION    Place 1 drop into both eyes at bedtime.    LISINOPRIL (PRINIVIL,ZESTRIL) 20 MG TABLET    Take 20 mg by mouth daily.     METFORMIN (GLUCOPHAGE) 1000 MG TABLET    Take 1,000 mg by mouth 2 (two) times daily with a meal.     MISC NATURAL PRODUCTS (GLUCOSAMINE CHONDROITIN ADV PO)    Take 2 tablets by mouth 2 (two) times daily.   MULTIPLE VITAMIN (MULTIVITAMIN) TABLET    Take 1 tablet by mouth daily.     PRAVASTATIN (PRAVACHOL) 20 MG TABLET    Take 10 mg by mouth daily.    SILDENAFIL (REVATIO) 20 MG TABLET    Take 3-5 tablets (60-100 mg total) by mouth daily as needed.   SITAGLIPTIN (JANUVIA) 100 MG TABLET    Take 1 tablet (100 mg total) by mouth daily.  Modified Medications   No medications on file  Discontinued Medications   No medications on file

## 2014-04-14 DIAGNOSIS — H401223 Low-tension glaucoma, left eye, severe stage: Secondary | ICD-10-CM | POA: Diagnosis not present

## 2014-04-14 DIAGNOSIS — E119 Type 2 diabetes mellitus without complications: Secondary | ICD-10-CM | POA: Diagnosis not present

## 2014-04-14 DIAGNOSIS — H401211 Low-tension glaucoma, right eye, mild stage: Secondary | ICD-10-CM | POA: Diagnosis not present

## 2014-05-13 ENCOUNTER — Other Ambulatory Visit: Payer: Self-pay

## 2014-05-13 ENCOUNTER — Other Ambulatory Visit: Payer: Self-pay | Admitting: *Deleted

## 2014-05-13 MED ORDER — SITAGLIPTIN PHOSPHATE 100 MG PO TABS: 100.0000 mg | ORAL_TABLET | Freq: Every day | ORAL | Status: DC

## 2014-06-17 ENCOUNTER — Other Ambulatory Visit: Payer: Self-pay | Admitting: *Deleted

## 2014-06-17 MED ORDER — GABAPENTIN 300 MG PO CAPS: 300.0000 mg | ORAL_CAPSULE | Freq: Three times a day (TID) | ORAL | Status: DC

## 2014-06-17 NOTE — Telephone Encounter (Signed)
Last office visit 03/19/2014.  Last refilled 03/19/2014 #90 with 3 refills.  Ok to refill?

## 2014-06-29 LAB — LIPID PANEL
Cholesterol: 155 mg/dL (ref 0–200)
LDL Cholesterol: 88 mg/dL

## 2014-06-29 LAB — BASIC METABOLIC PANEL: Creatinine: 1.2 mg/dL (ref <=1.3)

## 2014-06-29 LAB — HEMOGLOBIN A1C: Hgb A1c MFr Bld: 9.1 % — AB (ref 4.0–6.0)

## 2014-07-17 ENCOUNTER — Encounter: Payer: Medicare Other | Admitting: Internal Medicine

## 2014-07-22 ENCOUNTER — Ambulatory Visit (INDEPENDENT_AMBULATORY_CARE_PROVIDER_SITE_OTHER): Payer: Medicare Other | Admitting: Internal Medicine

## 2014-07-22 ENCOUNTER — Encounter: Payer: Self-pay | Admitting: Internal Medicine

## 2014-07-22 VITALS — BP 128/80 | HR 79 | Temp 97.4°F | Wt 240.0 lb

## 2014-07-22 DIAGNOSIS — G478 Other sleep disorders: Secondary | ICD-10-CM | POA: Diagnosis not present

## 2014-07-22 DIAGNOSIS — E1165 Type 2 diabetes mellitus with hyperglycemia: Secondary | ICD-10-CM | POA: Diagnosis not present

## 2014-07-22 DIAGNOSIS — E785 Hyperlipidemia, unspecified: Secondary | ICD-10-CM

## 2014-07-22 DIAGNOSIS — G4730 Sleep apnea, unspecified: Secondary | ICD-10-CM

## 2014-07-22 DIAGNOSIS — I10 Essential (primary) hypertension: Secondary | ICD-10-CM | POA: Diagnosis not present

## 2014-07-22 DIAGNOSIS — G4733 Obstructive sleep apnea (adult) (pediatric): Secondary | ICD-10-CM | POA: Insufficient documentation

## 2014-07-22 DIAGNOSIS — IMO0001 Reserved for inherently not codable concepts without codable children: Secondary | ICD-10-CM

## 2014-07-22 LAB — HM DIABETES FOOT EXAM

## 2014-07-22 NOTE — Assessment & Plan Note (Signed)
Lab Results  Component Value Date   LDLCALC 88 06/29/2014   Good control

## 2014-07-22 NOTE — Progress Notes (Signed)
Pre visit review using our clinic review tool, if applicable. No additional management support is needed unless otherwise documented below in the visit note. 

## 2014-07-22 NOTE — Assessment & Plan Note (Signed)
Non restorative sleep Will set up with sleep specialist

## 2014-07-22 NOTE — Progress Notes (Signed)
Subjective:    Patient ID: Lucas [name], male    DOB: 13-Sep-1940, 74 y.o.   MRN: [id number]  HPI Here for follow up of diabetes Not doing well "Not doing what I am supposed to do" Checked at [place]-- A1c up to 9.1% They are setting him up for a nutritionist He is ready for endocrinologist  Did gain weight Now has fitbit and trying to be more active Was doing work on house so not walking as much  Feet are fine No sores or numbness Eye exam tomorrow  No chest pain No SOB No headaches No dizziness or syncope  Still with urinary frequency Relates to drinking a lot of water Nocturia x 1 usually  Back pain is better with the gabapentin Has some knee pain at times Current Outpatient Prescriptions on File Prior to Visit  Medication Sig Dispense Refill  . albuterol (VENTOLIN HFA) 108 (90 BASE) MCG/ACT inhaler Inhale 2 puffs into the lungs every 4 (four) hours as needed.      Marland Kitchen aspirin 81 MG tablet Take 81 mg by mouth daily.      . brimonidine (ALPHAGAN) 0.2 % ophthalmic solution Place 1 drop into both eyes 2 (two) times daily.     . cetirizine (ZYRTEC) 10 MG tablet Take 10 mg by mouth daily.    Marland Kitchen gabapentin (NEURONTIN) 300 MG capsule Take 1 capsule (300 mg total) by mouth 3 (three) times daily. 90 capsule 3  . glipiZIDE (GLUCOTROL XL) 5 MG 24 hr tablet Take 1 tablet (5 mg total) by mouth daily. 30 tablet 11  . latanoprost (XALATAN) 0.005 % ophthalmic solution Place 1 drop into both eyes at bedtime.     Marland Kitchen lisinopril (PRINIVIL,ZESTRIL) 20 MG tablet Take 20 mg by mouth daily.      . metFORMIN (GLUCOPHAGE) 1000 MG tablet Take 1,000 mg by mouth 2 (two) times daily with a meal.      . Misc Natural Products (GLUCOSAMINE CHONDROITIN ADV PO) Take 2 tablets by mouth 2 (two) times daily.    . Multiple Vitamin (MULTIVITAMIN) tablet Take 1 tablet by mouth daily.      . pravastatin (PRAVACHOL) 20 MG tablet Take 10 mg by mouth daily.     . sildenafil (REVATIO) 20 MG tablet Take 3-5 tablets  (60-100 mg total) by mouth daily as needed. 50 tablet 11  . sitaGLIPtin (JANUVIA) 100 MG tablet Take 1 tablet (100 mg total) by mouth daily. 90 tablet 0   No current facility-administered medications on file prior to visit.    No Known Allergies  Past Medical History  Diagnosis Date  . Allergic rhinitis   . Diabetes mellitus type II   . Diverticulosis of colon   . HLD (hyperlipidemia)   . HTN (hypertension)   . Glaucoma   . Osteoarthritis   . History of agent Orange exposure   . Hypertrophy of prostate without urinary obstruction and other lower urinary tract symptoms (LUTS)     No past surgical history on file.  Family History  Problem Relation Age of Onset  . Colon cancer Father   . Stroke Mother   . Heart attack Mother   . Hypertension      several siblings  . Diabetes      several siblings  . Coronary artery disease Mother   . Coronary artery disease Brother     History   Social History  . Marital Status: Married    Spouse Name: N/A  . Number of  Children: N/A  . Years of Education: N/A   Occupational History  . Retired-[employer]    Social History Main Topics  . Smoking status: Never Smoker   . Smokeless tobacco: Never Used  . Alcohol Use: Yes     Comment: Rare  . Drug Use: No  . Sexual Activity: Not on file   Other Topics Concern  . Not on file   Social History Narrative   [employer] vet- 20% disability from shrapnel wounds 1967 and 20% agent orange   Married (Widowed 1996; remarried 6/03)   Retired [employer]      Has living will   Wife, then daughter Hoyle Barr will be health care POA   Would want attempts at resuscitation   Would not want feeding tube if cognitively unaware   Review of Systems Has chronic snoring and sleep problems. Concerned about sleep apnea--is noisy but not clear apnea (1st wife did feel he did stop breathing) Feels lethargic much of the time Fit bit measure showed only 5 hours of sleep despite being in bed for 8 hours Appetite is off  despite weight up 5#. Tends to eat the wrong things No recent depressed mood--did have some problems over a year ago        Physical Exam  Constitutional: He appears well-developed and well-nourished. No distress.  Neck: Normal range of motion. Neck supple. No thyromegaly present.  Cardiovascular: Normal rate, regular rhythm, normal heart sounds and intact distal pulses.  Exam reveals no gallop.   No murmur heard. Pulmonary/Chest: Effort normal and breath sounds normal. No respiratory distress. He has no wheezes. He has no rales.  Abdominal: Soft. There is no tenderness.  Musculoskeletal: He exhibits no edema or tenderness.  Lymphadenopathy:    He has no cervical adenopathy.  Neurological:  Normal sensation in plantar feet  Skin: No rash noted. No erythema.  No foot lesions  Psychiatric: He has a normal mood and affect. His behavior is normal.          Assessment & Plan:

## 2014-07-22 NOTE — Assessment & Plan Note (Signed)
Needs nutritional counseling and insulin (probably) Will refer to endocrinology

## 2014-07-22 NOTE — Assessment & Plan Note (Signed)
BP Readings from Last 3 Encounters:  07/22/14 128/80  03/19/14 118/74  01/13/14 110/78   Good control still

## 2014-07-23 DIAGNOSIS — H401211 Low-tension glaucoma, right eye, mild stage: Secondary | ICD-10-CM | POA: Diagnosis not present

## 2014-07-23 DIAGNOSIS — H401222 Low-tension glaucoma, left eye, moderate stage: Secondary | ICD-10-CM | POA: Diagnosis not present

## 2014-07-24 ENCOUNTER — Encounter: Payer: Self-pay | Admitting: Internal Medicine

## 2014-07-24 ENCOUNTER — Ambulatory Visit (INDEPENDENT_AMBULATORY_CARE_PROVIDER_SITE_OTHER): Payer: Medicare Other | Admitting: Internal Medicine

## 2014-07-24 VITALS — BP 116/68 | HR 85 | Ht 72.0 in | Wt 238.8 lb

## 2014-07-24 DIAGNOSIS — G4733 Obstructive sleep apnea (adult) (pediatric): Secondary | ICD-10-CM | POA: Diagnosis not present

## 2014-07-24 DIAGNOSIS — G478 Other sleep disorders: Secondary | ICD-10-CM | POA: Diagnosis not present

## 2014-07-24 DIAGNOSIS — G4730 Sleep apnea, unspecified: Secondary | ICD-10-CM

## 2014-07-24 NOTE — Assessment & Plan Note (Signed)
Appropriate question of obstructive sleep apnea. He definitely snores. We discussed the basic physiology and medical concerns related to obstructive sleep apnea versus snoring. Plan-schedule sleep study

## 2014-07-24 NOTE — Progress Notes (Signed)
07/24/14- 89 yoM never smoker without history of heart or lung disease. Treated for hypertension and diabetes. referred courtesy of Dr Silvio Pate for sleep disordered breathing-never had sleep study; Pt states his fitbit shows he is not sleeping well. Wife has told him he snores; wakes himself as well. Bedtime 10:30 PM, sleep latency 15 minutes, waking 3 times including nocturia 1, before up at 7 AM. Usually feels rested in the day without. 3 cups of coffee. No ENT surgery. Sleep has been affected by sciatica but that is better controlled now. Family history of snoring.  Prior to Admission medications   Medication Sig Start Date End Date Taking? Authorizing Provider  albuterol (VENTOLIN HFA) 108 (90 BASE) MCG/ACT inhaler Inhale 2 puffs into the lungs every 4 (four) hours as needed.     Yes Historical Provider, MD  aspirin 81 MG tablet Take 81 mg by mouth daily.     Yes Historical Provider, MD  brimonidine (ALPHAGAN) 0.2 % ophthalmic solution Place 1 drop into both eyes 2 (two) times daily.  06/03/11  Yes Historical Provider, MD  cetirizine (ZYRTEC) 10 MG tablet Take 10 mg by mouth daily.   Yes Historical Provider, MD  gabapentin (NEURONTIN) 300 MG capsule Take 1 capsule (300 mg total) by mouth 3 (three) times daily. 06/17/14  Yes Spencer [name], MD  glipiZIDE (GLUCOTROL XL) 5 MG 24 hr tablet Take 1 tablet (5 mg total) by mouth daily. 12/18/11  Yes [name], MD  latanoprost (XALATAN) 0.005 % ophthalmic solution Place 1 drop into both eyes at bedtime.  04/01/11  Yes Historical Provider, MD  lisinopril (PRINIVIL,ZESTRIL) 20 MG tablet Take 20 mg by mouth daily.     Yes Historical Provider, MD  metFORMIN (GLUCOPHAGE) 1000 MG tablet Take 1,000 mg by mouth 2 (two) times daily with a meal.     Yes Historical Provider, MD  Misc Natural Products (GLUCOSAMINE CHONDROITIN ADV PO) Take 2 tablets by mouth 2 (two) times daily.   Yes Historical Provider, MD  Multiple Vitamin (MULTIVITAMIN) tablet Take 1 tablet by  mouth daily.     Yes Historical Provider, MD  pravastatin (PRAVACHOL) 20 MG tablet Take 10 mg by mouth daily.  05/22/11  Yes Historical Provider, MD  sildenafil (REVATIO) 20 MG tablet Take 3-5 tablets (60-100 mg total) by mouth daily as needed. 07/14/13  Yes [name], MD  sitaGLIPtin (JANUVIA) 100 MG tablet Take 1 tablet (100 mg total) by mouth daily. 05/13/14  Yes [name], MD   Past Medical History  Diagnosis Date  . Allergic rhinitis   . Diabetes mellitus type II   . Diverticulosis of colon   . HLD (hyperlipidemia)   . HTN (hypertension)   . Glaucoma   . Osteoarthritis   . History of agent Orange exposure   . Hypertrophy of prostate without urinary obstruction and other lower urinary tract symptoms (LUTS)    No past surgical history on file. ' Family History  Problem Relation Age of Onset  . Colon cancer Father   . Stroke Mother   . Heart attack Mother   . Hypertension      several siblings  . Diabetes      several siblings  . Coronary artery disease Mother   . Coronary artery disease Brother    History   Social History  . Marital Status: Married    Spouse Name: N/A  . Number of Children: N/A  . Years of Education: N/A   Occupational History  .  Retired-[employer]    Social History Main Topics  . Smoking status: Never Smoker   . Smokeless tobacco: Never Used  . Alcohol Use: No  . Drug Use: No  . Sexual Activity: Not on file   Other Topics Concern  . 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 HEENT:    headaches, difficulty swallowing,  tooth/dental problems, sore throat,       sneezing, itching, ear ache, nasal congestion, post nasal drip, snoring CV:    chest pain, orthopnea, PND, swelling in lower extremities, anasarca,                                  dizziness, palpitations Resp:   shortness of breath with exertion or at rest.                productive cough,   non-productive cough, coughing up of blood.              change in color of mucus.  wheezing.   Skin:    rash or lesions. GI:  No-   heartburn, indigestion, abdominal pain, nausea, vomiting, diarrhea,                 change in bowel habits, loss of appetite GU: dysuria, change in color of urine, no urgency or frequency.   flank pain. MS:   joint pain, stiffness, decreased range of motion, back pain. Neuro-     nothing unusual Psych:  change in mood or affect.  depression or anxiety.   memory loss.  OBJ- Physical Exam General- Alert, Oriented, Affect-appropriate, Distress- none acute, not obese Skin- rash-none, lesions- none, excoriation- none Lymphadenopathy- none Head- atraumatic            Eyes- Gross vision intact, PERRLA, conjunctivae and secretions clear            Ears- Hearing, canals-normal            Nose- Clear, no-Septal dev, mucus, polyps, erosion, perforation             Throat- Mallampati II-III, mucosa clear , drainage- none, tonsils- atrophic Neck- flexible , trachea midline, no stridor , thyroid nl, carotid no bruit Chest - symmetrical excursion , unlabored           Heart/CV- RRR , no murmur , no gallop  , no rub, nl s1 s2                           - JVD- none , edema- none, stasis changes- none, varices- none           Lung- clear to P&A, wheeze- none, cough- none , dullness-none, rub- none           Chest wall-  Abd-  Br/ Gen/ Rectal- Not done, not indicated Extrem- cyanosis- none, clubbing, none, atrophy- none, strength- nl Neuro- grossly intact to observation

## 2014-07-24 NOTE — Patient Instructions (Signed)
Order- Unattended home sleep study    Dx OSA  Schedule return ov about 2 weeks later

## 2014-07-30 ENCOUNTER — Other Ambulatory Visit: Payer: Self-pay | Admitting: *Deleted

## 2014-07-30 MED ORDER — SILDENAFIL CITRATE 20 MG PO TABS: 60.0000 mg | ORAL_TABLET | Freq: Every day | ORAL | Status: DC | PRN

## 2014-07-30 NOTE — Addendum Note (Signed)
Addended by: Josetta Huddle on: 07/30/2014 10:44 AM   Modules accepted: Orders

## 2014-08-07 NOTE — Addendum Note (Signed)
Addended by: Lorane Gell on: 08/07/2014 03:28 PM   Modules accepted: Orders

## 2014-08-10 ENCOUNTER — Other Ambulatory Visit: Payer: Self-pay | Admitting: Internal Medicine

## 2014-08-19 ENCOUNTER — Encounter: Payer: Self-pay | Admitting: Endocrinology

## 2014-08-19 ENCOUNTER — Ambulatory Visit (INDEPENDENT_AMBULATORY_CARE_PROVIDER_SITE_OTHER): Payer: Medicare Other | Admitting: Endocrinology

## 2014-08-19 VITALS — BP 118/64 | HR 85 | Resp 12 | Ht 73.0 in | Wt 232.8 lb

## 2014-08-19 DIAGNOSIS — IMO0001 Reserved for inherently not codable concepts without codable children: Secondary | ICD-10-CM

## 2014-08-19 DIAGNOSIS — I10 Essential (primary) hypertension: Secondary | ICD-10-CM | POA: Diagnosis not present

## 2014-08-19 DIAGNOSIS — E1165 Type 2 diabetes mellitus with hyperglycemia: Secondary | ICD-10-CM | POA: Diagnosis not present

## 2014-08-19 DIAGNOSIS — E785 Hyperlipidemia, unspecified: Secondary | ICD-10-CM | POA: Diagnosis not present

## 2014-08-19 DIAGNOSIS — M791 Myalgia: Secondary | ICD-10-CM

## 2014-08-19 DIAGNOSIS — M609 Myositis, unspecified: Secondary | ICD-10-CM

## 2014-08-19 LAB — BASIC METABOLIC PANEL
BUN: 18 mg/dL (ref 6–23)
CO2: 27 mEq/L (ref 19–32)
Calcium: 9.7 mg/dL (ref 8.4–10.5)
Chloride: 103 mEq/L (ref 96–112)
Creatinine, Ser: 1.04 mg/dL (ref 0.40–1.50)
GFR: 90.12 mL/min (ref >60.00)
Glucose, Bld: 105 mg/dL — ABNORMAL HIGH (ref 70–99)
Potassium: 4.8 mEq/L (ref 3.5–5.1)
Sodium: 137 mEq/L (ref 135–145)

## 2014-08-19 LAB — MICROALBUMIN / CREATININE URINE RATIO
Creatinine,U: 101.8 mg/dL
Microalb Creat Ratio: 0.7 mg/g (ref 0.0–30.0)
Microalb, Ur: <0.7 mg/dL (ref 0.0–1.9)

## 2014-08-19 LAB — HM DIABETES FOOT EXAM: HM Diabetic Foot Exam: NORMAL

## 2014-08-19 LAB — VITAMIN D 25 HYDROXY (VIT D DEFICIENCY, FRACTURES): VITD: 26.8 ng/mL — ABNORMAL LOW (ref 30.00–100.00)

## 2014-08-19 NOTE — Progress Notes (Signed)
Reason for visit-  Lucas [name] is a 74 y.o.-year-old male, referred by his PCP,  [name], MD for management of Type 2 diabetes, uncontrolled, without complications. [facility]    HPI- Patient has been diagnosed with diabetes in 1998. Recalls being initially on lifestyle modifications.  Tried  Metformin, Glipizide, Januvia. he has not been on insulin before.   * trying to walk more since 2014 Winter, when he felt that he was mildly depressed Not having good appetite, missing meals at regular times, then trying to catch up with wrong kinds of food. *plans to start "Jump Start"  lifestyle Research Trial June 19th at [facility]  Pt is currently on a regimen of: - Metformin 1000 mg po bid - Glipizide 5 mg daily ( am) - Januvia 100 mg daily ( increased around dec 2015)   Last hemoglobin A1c was trending up: Lab Results  Component Value Date   HGBA1C 9.1* 06/29/2014   HGBA1C 8.7* 01/13/2014   HGBA1C 7.9* 07/14/2013     Pt checks his sugars 1 a day ( mostly) . Uses ? glucometer. By recall they are:  * now sees lower readings in the morning as low as 89 *feels like he is having lows, when skips a meal  PREMEAL Breakfast Lunch Dinner Bedtime Overall  Glucose range: 155-198      Mean/median:        POST-MEAL PC Breakfast PC Lunch PC Dinner  Glucose range:     Mean/median:       Hypoglycemia-  No lows. Lowest sugar was 86; he has hypoglycemia awareness at 70. Sometimes feels like he is going low, when he skips a meal these days  Dietary habits- eats three times daily. Tries to limit carbs, sweetened beverages, diet sodas.  BF- oatmeal, raisins, nuts, light [place] sugar, cinnamon, fruit, half english muffin Or Eggs/bacon Tries to have [place] rice, wheat bread Lunch -fast food, burger or when at home fried chicken, hamburger, hot dog, fish sandwich on wheat bun Supper- [place] chili with beans,soups, salad, BBQ Loves sweets, but tries to not buy   Exercise- walks at  mall almost daily. Gym 2x weekly, now trying to walk more Weight - down Wt Readings from Last 3 Encounters:  08/19/14 232 lb 12.8 oz (105.597 kg)  07/24/14 238 lb 12.8 oz (108.319 kg)  07/22/14 240 lb (108.863 kg)    Diabetes Complications-  Nephropathy- No  CKD, last BUN/creatinine-  Lab Results  Component Value Date   BUN 17 07/14/2013   CREATININE 1.2 06/29/2014   Lab Results  Component Value Date   GFR 97.97 07/14/2013   MICRALBCREAT 0.6 06/06/2011    Retinopathy- No, Last DEE was in  qlast 3 months Neuropathy- no numbness and tingling in )his feet. No known neuropathy.  Is on Gabapentin for sciatica.  Associated history - No CAD . No prior stroke. No hypothyroidism. his last TSH was  Lab Results  Component Value Date   TSH 1.85 07/14/2013    Hyperlipidemia-  his last set of lipids were- Currently on pravachol 20. Tolerating well, but still having muscle aches Has been on pravastatin for past 18 months. Was not tolerating Lipitor in the past, hence switched to pravastatin. Doesn't know why he is being treated with a statin  Lab Results  Component Value Date   CHOL 155 06/29/2014   HDL 35.10* 07/14/2013   LDLCALC 88 06/29/2014   LDLDIRECT 75.7 07/02/2012   TRIG 342.0* 07/14/2013   CHOLHDL  5 07/14/2013    Blood Pressure/HTN- Patient's blood pressure is well controlled today on current regimen that includes ACE-I ( lisinopril)  Pt has FH of DM in mother, sisters and brothers.  I have reviewed the patient's past medical history, family and social history, surgical history, medications and allergies.  Past Medical History  Diagnosis Date  . Allergic rhinitis   . Diabetes mellitus type II   . Diverticulosis of colon   . HLD (hyperlipidemia)   . HTN (hypertension)   . Glaucoma   . Osteoarthritis   . History of agent Orange exposure   . Hypertrophy of prostate without urinary obstruction and other lower urinary tract symptoms (LUTS)    No past surgical history on  file. Family History  Problem Relation Age of Onset  . Colon cancer Father   . Stroke Mother   . Heart attack Mother   . Hypertension      several siblings  . Diabetes      several siblings  . Coronary artery disease Mother   . Coronary artery disease Brother    History   Social History  . Marital Status: Married    Spouse Name: N/A  . Number of Children: N/A  . Years of Education: N/A   Occupational History  . Retired-[employer]    Social History Main Topics  . Smoking status: Never Smoker   . Smokeless tobacco: Never Used  . Alcohol Use: No  . Drug Use: No  . Sexual Activity: Not on file   Other Topics Concern  . 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 albuterol (VENTOLIN HFA) 108 (90 BASE) MCG/ACT inhaler Inhale 2 puffs into the lungs every 4 (four) hours as needed.      Marland Kitchen aspirin 81 MG tablet Take 81 mg by mouth daily.      . brimonidine (ALPHAGAN) 0.2 % ophthalmic solution Place 1 drop into both eyes 2 (two) times daily.     . cetirizine (ZYRTEC) 10 MG tablet Take 10 mg by mouth daily.    Marland Kitchen gabapentin (NEURONTIN) 300 MG capsule Take 1 capsule (300 mg total) by mouth 3 (three) times daily. 90 capsule 3  . glipiZIDE (GLUCOTROL XL) 5 MG 24 hr tablet Take 1 tablet (5 mg total) by mouth daily. 30 tablet 11  . JANUVIA 100 MG tablet TAKE 1 TABLET (100 MG TOTAL) BY MOUTH DAILY. 90 tablet 3  . latanoprost (XALATAN) 0.005 % ophthalmic solution Place 1 drop into both eyes at bedtime.     Marland Kitchen lisinopril (PRINIVIL,ZESTRIL) 20 MG tablet Take 20 mg by mouth daily.      . metFORMIN (GLUCOPHAGE)  1000 MG tablet Take 1,000 mg by mouth 2 (two) times daily with a meal.      . Misc Natural Products (GLUCOSAMINE CHONDROITIN ADV PO) Take 2 tablets by mouth 2 (two) times daily.    . Multiple Vitamin (MULTIVITAMIN) tablet Take 1 tablet by mouth daily.      . pravastatin (PRAVACHOL) 20 MG tablet Take 10 mg by mouth daily.     Marland Kitchen  sildenafil (REVATIO) 20 MG tablet Take 3-5 tablets (60-100 mg total) by mouth daily as needed. 50 tablet 11   No current facility-administered medications on file prior to visit.   No Known Allergies   Review of Systems: [x]  complains of  [  ] denies General:   [  ] Recent weight change [  ] Fatigue  [  ] Loss of appetite Eyes: [  ]  Vision Difficulty [  ]  Eye pain ENT: [  ]  Hearing difficulty [  ]  Difficulty Swallowing CVS: [  ] Chest pain [  ]  Palpitations/Irregular Heart beat [  ]  Shortness of breath lying flat [  ] Swelling of legs Resp: [  ] Frequent Cough [  ] Shortness of Breath  [  ]  Wheezing GI: [  ] Heartburn  [  ] Nausea or Vomiting  [  ] Diarrhea [  ] Constipation  [  ] Abdominal Pain GU: [  ]  Polyuria  [  ]  nocturia Bones/joints:  [ x ]  Muscle aches  [ x ] Joint Pain  [  ] Bone pain Skin/Hair/Nails: [  ]  Rash  [  ] New stretch marks [  ]  Itching [  ] Hair loss [  ]  Excessive hair growth Reproduction: [ x ] Low sexual desire , [  ]  Women: Menstrual cycle problems [  ]  Women: Breast Discharge [ x ] Men: Difficulty with erections [  ]  Men: Enlarged Breasts CNS: [  ] Frequent Headaches [  ] Blurry vision [  ] Tremors [  ] Seizures [  ] Loss of consciousness [  ] Localized weakness Endocrine: [  ]  Excess thirst [  ]  Feeling excessively hot [  ]  Feeling excessively cold Heme: [  ]  Easy bruising [  ]  Enlarged glands or lumps in neck Allergy: [  ]  Food allergies [  ] Environmental allergies  PE: BP 118/64 mmHg  Pulse 85  Resp 12  Ht 6\' 1"  (1.854 m)  Wt 232 lb 12.8 oz (105.597 kg)  BMI 30.72 kg/m2  SpO2 96% Wt Readings from Last 3  Encounters:  08/19/14 232 lb 12.8 oz (105.597 kg)  07/24/14 238 lb 12.8 oz (108.319 kg)  07/22/14 240 lb (108.863 kg)   GENERAL: No acute distress, well developed HEENT:  Eye exam shows normal external appearance. Oral exam shows normal mucosa .  NECK:   Neck exam shows no lymphadenopathy. No Carotids bruits. Thyroid is not enlarged and no nodules felt.  no acanthosis nigricans LUNGS:         Chest is symmetrical. Lungs are clear to auscultation.Marland Kitchen   HEART:         Heart sounds:  S1 and S2 are normal. No murmurs or clicks heard. ABDOMEN:  No Distention present. Liver and spleen are not palpable. No other mass or tenderness present.  EXTREMITIES:     There is no edema. 2+ DP pulses  NEUROLOGICAL:     Grossly intact.            Diabetic foot exam done with shoes and socks removed: Normal Monofilament testing bilaterally. No deformities of toes.  Nails  Not dystrophic. Skin normal color. No open wounds. Dry skin. Few callosities MUSCULOSKELETAL:       There is no enlargement or gross deformity of the joints.  SKIN:  No rash  ASSESSMENT AND PLAN: Problem List Items Addressed This Visit      Cardiovascular and Mediastinum   Essential hypertension, benign    BP at target on current regimen. Update urine MA at this visit.        Other   Diabetes mellitus type 2, uncontrolled, without complications - Primary    Reviewed goal sugars and A1c. Reviewed risk of long term complications with DM.  Discussed dietary modifications, consistency in eating, physical activity  Discussed checking sugars 2 x daily.  Discussed medication regimens and discussed options like GLP-1, insulin, SGLT2 versus increasing SU.  He has elected to try SGLT2 therapy and I have discussed risk profile and common side effects with the patient.  Will update GFR today and if it is at acceptable range, then will likely start Invokana 100 mg daily. Continue current medications for now. As this medication works, and if he  starts having low sugars, then will decrease dose of Glipizide.   Foot care, hypoglycemia, eye exams discussed.        Relevant Orders   Microalbumin / creatinine urine ratio   Basic metabolic panel   Vit D  25 hydroxy (rtn osteoporosis monitoring)   Hyperlipemia    Reviewed with him need for statin therapy given his prior hx elevated levels for LDL ( 121 ~4 years ago). Discussed goal LDL levels and [facility] benefits. Recent LDL is at target on current therapy. Screen for Vitamin d deficiency given myalgias, which are tolerable. Replete if levels are low.       Relevant Orders   Basic metabolic panel   Vit D  25 hydroxy (rtn osteoporosis monitoring)    Other Visit Diagnoses    Myalgia and myositis        Relevant Orders    Vit D  25 hydroxy (rtn osteoporosis monitoring)        - Return to clinic in 1 mo with sugar log/meter.  Geanette Buonocore [facility] 08/19/2014 11:55 AM

## 2014-08-19 NOTE — Progress Notes (Signed)
Pre visit review using our clinic review tool, if applicable. No additional management support is needed unless otherwise documented below in the visit note. 

## 2014-08-19 NOTE — Assessment & Plan Note (Signed)
BP at target on current regimen. Update urine MA at this visit.

## 2014-08-19 NOTE — Assessment & Plan Note (Addendum)
Reviewed goal sugars and A1c. Reviewed risk of long term complications with DM.  Discussed dietary modifications, consistency in eating, physical activity  Discussed checking sugars 2 x daily.  Discussed medication regimens and discussed options like GLP-1, insulin, SGLT2 versus increasing SU.  He has elected to try SGLT2 therapy and I have discussed risk profile and common side effects with the patient.  Will update GFR today and if it is at acceptable range, then will likely start Invokana 100 mg daily. Continue current medications for now. As this medication works, and if he starts having low sugars, then will decrease dose of Glipizide.   Foot care, hypoglycemia, eye exams discussed.

## 2014-08-19 NOTE — Assessment & Plan Note (Signed)
Reviewed with him need for statin therapy given his prior hx elevated levels for LDL ( 121 ~4 years ago). Discussed goal LDL levels and [facility] benefits. Recent LDL is at target on current therapy. Screen for Vitamin d deficiency given myalgias, which are tolerable. Replete if levels are low.

## 2014-08-19 NOTE — Patient Instructions (Signed)
Check sugars 2 x daily ( before breakfast and before supper).  Record them in a log book and bring that/meter to next appointment.  Continue current medications.  Labs today.  If GFR is acceptable, then he wants to try to add Invokana to his regimen.   Please come back for a follow-up appointment in 1 month.

## 2014-08-21 ENCOUNTER — Other Ambulatory Visit: Payer: Self-pay | Admitting: Endocrinology

## 2014-08-21 DIAGNOSIS — IMO0001 Reserved for inherently not codable concepts without codable children: Secondary | ICD-10-CM

## 2014-08-21 DIAGNOSIS — E1165 Type 2 diabetes mellitus with hyperglycemia: Principal | ICD-10-CM

## 2014-08-21 MED ORDER — CANAGLIFLOZIN 100 MG PO TABS: 100.0000 mg | ORAL_TABLET | Freq: Every day | ORAL | Status: DC

## 2014-08-24 ENCOUNTER — Telehealth: Payer: Self-pay | Admitting: *Deleted

## 2014-08-24 ENCOUNTER — Telehealth: Payer: Self-pay | Admitting: Internal Medicine

## 2014-08-24 NOTE — Telephone Encounter (Signed)
Spoke with pt. He is to set up for a home sleep study. Has not heard from our office.  [facility] - please contact this pt. Thanks!

## 2014-08-24 NOTE — Telephone Encounter (Signed)
Fax from pharmacy, needing PA for Tatums. Started online, pending response.

## 2014-08-25 ENCOUNTER — Encounter: Payer: Self-pay | Admitting: Endocrinology

## 2014-08-25 MED ORDER — DAPAGLIFLOZIN PROPANEDIOL 5 MG PO TABS: 5.0000 mg | ORAL_TABLET | Freq: Every day | ORAL | Status: DC

## 2014-08-25 NOTE — Addendum Note (Signed)
Addended by: Geni Bers on: 08/25/2014 05:02 PM   Modules accepted: Orders, Medications

## 2014-08-25 NOTE — Telephone Encounter (Signed)
PA for invokana denied. Per Dr. Howell Rucks change Rx to farxiga 5 mg daily.

## 2014-08-25 NOTE — Telephone Encounter (Signed)
Ordered 4/29

## 2014-08-25 NOTE — Telephone Encounter (Signed)
Left message 3-4 wks after ordered he will be getting a call soon Joellen [place]

## 2014-08-25 NOTE — Telephone Encounter (Signed)
Patient notified of the changes. 

## 2014-09-16 ENCOUNTER — Encounter: Payer: Self-pay | Admitting: Endocrinology

## 2014-09-16 ENCOUNTER — Ambulatory Visit (INDEPENDENT_AMBULATORY_CARE_PROVIDER_SITE_OTHER): Payer: Medicare Other | Admitting: Endocrinology

## 2014-09-16 ENCOUNTER — Other Ambulatory Visit: Payer: Self-pay | Admitting: Endocrinology

## 2014-09-16 VITALS — BP 118/60 | HR 79 | Resp 12 | Ht 73.0 in | Wt 226.5 lb

## 2014-09-16 DIAGNOSIS — E1165 Type 2 diabetes mellitus with hyperglycemia: Secondary | ICD-10-CM

## 2014-09-16 DIAGNOSIS — IMO0001 Reserved for inherently not codable concepts without codable children: Secondary | ICD-10-CM

## 2014-09-16 DIAGNOSIS — I10 Essential (primary) hypertension: Secondary | ICD-10-CM

## 2014-09-16 LAB — BASIC METABOLIC PANEL
BUN: 34 mg/dL — ABNORMAL HIGH (ref 6–23)
CO2: 25 mEq/L (ref 19–32)
Calcium: 9.5 mg/dL (ref 8.4–10.5)
Chloride: 102 mEq/L (ref 96–112)
Creatinine, Ser: 1.34 mg/dL (ref 0.40–1.50)
GFR: 67.26 mL/min (ref >60.00)
Glucose, Bld: 116 mg/dL — ABNORMAL HIGH (ref 70–99)
Potassium: 5.6 mEq/L — ABNORMAL HIGH (ref 3.5–5.1)
Sodium: 134 mEq/L — ABNORMAL LOW (ref 135–145)

## 2014-09-16 NOTE — Assessment & Plan Note (Signed)
Continue checking sugars 2 x daily. Congratulated him on recent effort.  Recommend moving Farxiga to morning time.  Update GFR and lytes today. Continue to work on dietary changes.As he starts to see lower sugars <90 in the morning, then recommend that he calls [place] back for further medication adjustments- will plan to decrease Glipizide then and possibly wean off at next few visits.  Continue current metformin, farxiga, januvia and glipizide.

## 2014-09-16 NOTE — Assessment & Plan Note (Signed)
BP at target on current regimen. Recent urine MA normal May 2016.

## 2014-09-16 NOTE — Progress Notes (Signed)
Reason for visit-  Lucas [name] is a 74 y.o.-year-old male,  for management of Type 2 diabetes, uncontrolled, without complications. [facility] Last seen May 2016.   HPI- Patient has been diagnosed with diabetes in 1998. Recalls being initially on lifestyle modifications.  Tried  Metformin, Glipizide, Januvia. he has not been on insulin before.   * trying to walk more since 2014 Winter, when he felt that he was mildly depressed Was Not having good appetite, missing meals at regular times, then trying to catch up with wrong kinds of food. *Started "Jump Start"  lifestyle Research Trial June 19th at [facility]  Pt is currently on a regimen of: - Metformin 1000 mg po bid - Glipizide 5 mg daily ( am) - Januvia 100 mg daily ( increased around dec 2015) -Farxiga 5 mg daily ( start May 2016- [employer]okana not covered)  *likes new medication, tolerating well, takes it at night time  Last hemoglobin A1c was trending up: Lab Results  Component Value Date   HGBA1C 9.1* 06/29/2014   HGBA1C 8.7* 01/13/2014   HGBA1C 7.9* 07/14/2013     Pt checks his sugars 2 a day ( mostly) . Uses ? glucometer. By sugar log they are:   *feels like he is having lows, when skips a meal>not happenned recently *higher number with dietary indiscretions  PREMEAL Breakfast Lunch Dinner Bedtime Overall  Glucose range: 126-175  112-130 mostly    Mean/median:        POST-MEAL PC Breakfast PC Lunch PC Dinner  Glucose range:     Mean/median:       Hypoglycemia-  No lows. Lowest sugar was n/a; he has hypoglycemia awareness at 70. Sometimes feels like he is going low, when he skips a meal these days  Dietary habits- eats three times daily. Tries to limit carbs, sweetened beverages, diet sodas.  BF- oatmeal, raisins, nuts, light [place] sugar, cinnamon, fruit, half english muffin Or Eggs/bacon Tries to have Joy[place] rice, wheat bread Lunch -fast food, burger or when at home fried chicken, hamburger, hot dog, fish  sandwich on wheat bun Supper- [place] chili with beans,soups, salad, BBQ Loves sweets, but tries to not buy  Made several positive dietary changes since last visit, lowering carb overall  Exercise- walks at mall almost daily. Gym 2x weekly, now trying to walk more Weight - down Wt Readings from Last 3 Encounters:  09/16/14 226 lb 8 oz (102.74 kg)  08/19/14 232 lb 12.8 oz (105.597 kg)  07/24/14 238 lb 12.8 oz (108.319 kg)    Diabetes Complications-  Nephropathy- No  CKD, last BUN/creatinine-  Lab Results  Component Value Date   BUN 18 08/19/2014   CREATININE 1.04 08/19/2014   Lab Results  Component Value Date   GFR 90.12 08/19/2014   MICRALBCREAT 0.7 08/19/2014    Retinopathy- No, Last DEE was in  qlast 3 months Neuropathy- no numbness and tingling in )his feet. No known neuropathy.  Is on Gabapentin for sciatica.  Associated history - No CAD . No prior stroke. No hypothyroidism. his last TSH was  Lab Results  Component Value Date   TSH 1.85 07/14/2013    Hyperlipidemia-  his last set of lipids were- Currently on pravachol 20. Tolerating well, but still having muscle aches Has been on pravastatin for past 18 months. Was not tolerating Lipitor in the past, hence switched to pravastatin.   Lab Results  Component Value Date   CHOL 155 06/29/2014   HDL 35.10* 07/14/2013  [place] 88 06/29/2014   LDLDIRECT 75.7 07/02/2012   TRIG 342.0* 07/14/2013   CHOLHDL 5 07/14/2013    Blood Pressure/HTN- Patient's blood pressure is well controlled today on current regimen that includes ACE-I ( lisinopril)  Pt has FH of DM in mother, sisters and brothers.  I have reviewed the patient's past medical history, family and social history, surgical history, medications and allergies.   Current Outpatient Prescriptions on File Prior to Visit  Medication Sig Dispense Refill  . albuterol (VENTOLIN HFA) 108 (90 BASE) MCG/ACT inhaler Inhale 2 puffs into the lungs every 4 (four) hours as needed.       Marland Kitchen aspirin 81 MG tablet Take 81 mg by mouth daily.      . brimonidine (ALPHAGAN) 0.2 % ophthalmic solution Place 1 drop into both eyes 2 (two) times daily.     . cetirizine (ZYRTEC) 10 MG tablet Take 10 mg by mouth daily.    . dapagliflozin propanediol (FARXIGA) 5 MG TABS tablet Take 5 mg by mouth daily. 30 tablet 3  . fluticasone (FLONASE) 50 MCG/ACT nasal spray     . gabapentin (NEURONTIN) 300 MG capsule Take 1 capsule (300 mg total) by mouth 3 (three) times daily. 90 capsule 3  . glipiZIDE (GLUCOTROL XL) 5 MG 24 hr tablet Take 1 tablet (5 mg total) by mouth daily. 30 tablet 11  . JANUVIA 100 MG tablet TAKE 1 TABLET (100 MG TOTAL) BY MOUTH DAILY. 90 tablet 3  . ketoconazole (NIZORAL) 2 % cream     . latanoprost (XALATAN) 0.005 % ophthalmic solution Place 1 drop into both eyes at bedtime.     Marland Kitchen lisinopril (PRINIVIL,ZESTRIL) 20 MG tablet Take 20 mg by mouth daily.      . metFORMIN (GLUCOPHAGE) 1000 MG tablet Take 1,000 mg by mouth 2 (two) times daily with a meal.      . Misc Natural Products (GLUCOSAMINE CHONDROITIN ADV PO) Take 2 tablets by mouth 2 (two) times daily.    . Multiple Vitamin (MULTIVITAMIN) tablet Take 1 tablet by mouth daily.      . pravastatin (PRAVACHOL) 20 MG tablet Take 10 mg by mouth daily.     . sildenafil (REVATIO) 20 MG tablet Take 3-5 tablets (60-100 mg total) by mouth daily as needed. 50 tablet 11   No current facility-administered medications on file prior to visit.   No Known Allergies  Review of Systems- [ x ]  Complains of    [  ]  denies [  x] Recent weight change [  ]  Fatigue [  ] polydipsia [  ] polyuria [  ]  nocturia [  ]  vision difficulty [  ] chest pain [  ] shortness of breath [  ] leg swelling [  ] cough [  ] nausea/vomiting [  ] diarrhea [  ] constipation [  ] abdominal pain [  ]  tingling/numbness in extremities [  ]  concern with feet ( wounds/sores)   PE: BP 118/60 mmHg  Pulse 79  Resp 12  Ht 6\' 1"  (1.854 m)  Wt 226 lb 8 oz (102.74 kg)   BMI 29.89 kg/m2  SpO2 98% Wt Readings from Last 3 Encounters:  09/16/14 226 lb 8 oz (102.74 kg)  08/19/14 232 lb 12.8 oz (105.597 kg)  07/24/14 238 lb 12.8 oz (108.319 kg)   Exam: deferred  ASSESSMENT AND PLAN: Problem List Items Addressed This Visit      Cardiovascular and Mediastinum   Essential  hypertension, benign    BP at target on current regimen. Recent urine MA normal May 2016.         Other   Diabetes mellitus type 2, uncontrolled, without complications - Primary    Continue checking sugars 2 x daily. Congratulated him on recent effort.  Recommend moving Farxiga to morning time.  Update GFR and lytes today. Continue to work on dietary changes.As he starts to see lower sugars <90 in the morning, then recommend that he calls [place] back for further medication adjustments- will plan to decrease Glipizide then and possibly wean off at next few visits.  Continue current metformin, farxiga, januvia and glipizide.                - Return to clinic in 1 mo with sugar log/meter. Follow up at [facility] location, since I am [place] of state.   Bianey Tesoro [facility] 09/16/2014 11:45 AM

## 2014-09-16 NOTE — Patient Instructions (Signed)
Labs today. Continue current medications.   Please come back for a follow-up appointment in 1 month.

## 2014-09-16 NOTE — Progress Notes (Signed)
Pre visit review using our clinic review tool, if applicable. No additional management support is needed unless otherwise documented below in the visit note. 

## 2014-09-18 ENCOUNTER — Other Ambulatory Visit (INDEPENDENT_AMBULATORY_CARE_PROVIDER_SITE_OTHER): Payer: Medicare Other

## 2014-09-18 ENCOUNTER — Encounter: Payer: Self-pay | Admitting: Endocrinology

## 2014-09-18 DIAGNOSIS — E1165 Type 2 diabetes mellitus with hyperglycemia: Secondary | ICD-10-CM

## 2014-09-18 DIAGNOSIS — IMO0001 Reserved for inherently not codable concepts without codable children: Secondary | ICD-10-CM

## 2014-09-18 LAB — BASIC METABOLIC PANEL
BUN: 36 mg/dL — ABNORMAL HIGH (ref 6–23)
CO2: 26 mEq/L (ref 19–32)
Calcium: 9 mg/dL (ref 8.4–10.5)
Chloride: 102 mEq/L (ref 96–112)
Creatinine, Ser: 1.37 mg/dL (ref 0.40–1.50)
GFR: 65.56 mL/min (ref >60.00)
Glucose, Bld: 183 mg/dL — ABNORMAL HIGH (ref 70–99)
Potassium: 5.3 mEq/L — ABNORMAL HIGH (ref 3.5–5.1)
Sodium: 133 mEq/L — ABNORMAL LOW (ref 135–145)

## 2014-09-22 ENCOUNTER — Encounter: Payer: Self-pay | Admitting: *Deleted

## 2014-09-23 ENCOUNTER — Telehealth: Payer: Self-pay | Admitting: *Deleted

## 2014-09-23 ENCOUNTER — Other Ambulatory Visit: Payer: Self-pay | Admitting: Endocrinology

## 2014-09-23 ENCOUNTER — Other Ambulatory Visit (INDEPENDENT_AMBULATORY_CARE_PROVIDER_SITE_OTHER): Payer: Medicare Other

## 2014-09-23 DIAGNOSIS — IMO0001 Reserved for inherently not codable concepts without codable children: Secondary | ICD-10-CM

## 2014-09-23 DIAGNOSIS — E1165 Type 2 diabetes mellitus with hyperglycemia: Secondary | ICD-10-CM

## 2014-09-23 LAB — BASIC METABOLIC PANEL
BUN: 27 mg/dL — ABNORMAL HIGH (ref 6–23)
CO2: 24 mEq/L (ref 19–32)
Calcium: 9.6 mg/dL (ref 8.4–10.5)
Chloride: 105 mEq/L (ref 96–112)
Creatinine, Ser: 1.2 mg/dL (ref 0.40–1.50)
GFR: 76.38 mL/min (ref >60.00)
Glucose, Bld: 122 mg/dL — ABNORMAL HIGH (ref 70–99)
Potassium: 5.3 mEq/L — ABNORMAL HIGH (ref 3.5–5.1)
Sodium: 135 mEq/L (ref 135–145)

## 2014-09-23 NOTE — Telephone Encounter (Signed)
Labs and dx?  

## 2014-09-23 NOTE — Telephone Encounter (Signed)
Orders in emr. thanks

## 2014-09-25 ENCOUNTER — Encounter: Payer: Self-pay | Admitting: *Deleted

## 2014-10-13 ENCOUNTER — Encounter: Payer: Self-pay | Admitting: Internal Medicine

## 2014-10-13 ENCOUNTER — Ambulatory Visit (INDEPENDENT_AMBULATORY_CARE_PROVIDER_SITE_OTHER): Payer: Medicare Other | Admitting: Internal Medicine

## 2014-10-13 VITALS — BP 120/70 | HR 92 | Temp 98.3°F | Wt 227.0 lb

## 2014-10-13 DIAGNOSIS — E1165 Type 2 diabetes mellitus with hyperglycemia: Secondary | ICD-10-CM

## 2014-10-13 DIAGNOSIS — IMO0001 Reserved for inherently not codable concepts without codable children: Secondary | ICD-10-CM

## 2014-10-13 DIAGNOSIS — I10 Essential (primary) hypertension: Secondary | ICD-10-CM | POA: Diagnosis not present

## 2014-10-13 LAB — RENAL FUNCTION PANEL
Albumin: 4 g/dL (ref 3.5–5.2)
BUN: 24 mg/dL — ABNORMAL HIGH (ref 6–23)
CO2: 28 mEq/L (ref 19–32)
Calcium: 9.6 mg/dL (ref 8.4–10.5)
Chloride: 100 mEq/L (ref 96–112)
Creatinine, Ser: 1.21 mg/dL (ref 0.40–1.50)
GFR: 75.65 mL/min (ref >60.00)
Glucose, Bld: 211 mg/dL — ABNORMAL HIGH (ref 70–99)
Phosphorus: 3.3 mg/dL (ref 2.3–4.6)
Potassium: 4.6 mEq/L (ref 3.5–5.1)
Sodium: 136 mEq/L (ref 135–145)

## 2014-10-13 NOTE — Assessment & Plan Note (Signed)
BP Readings from Last 3 Encounters:  10/13/14 120/70  09/16/14 118/60  08/19/14 118/64   Repeat on right 126/74

## 2014-10-13 NOTE — Progress Notes (Signed)
Pre visit review using our clinic review tool, if applicable. No additional management support is needed unless otherwise documented below in the visit note. 

## 2014-10-13 NOTE — Progress Notes (Signed)
Subjective:    Patient ID: Lucas [name], male    DOB: August 03, 1940, 74 y.o.   MRN: [id number]  HPI Here for follow up of BP Renal function change on farxiga so he stopped this Things did improve but was due to have this rechecked and his BP Had tried low potassium diet due to this also  Checked his BP before recent cruise to [place] Was fine Didn't gain weight on the cruise!  Current Outpatient Prescriptions on File Prior to Visit  Medication Sig Dispense Refill  . albuterol (VENTOLIN HFA) 108 (90 BASE) MCG/ACT inhaler Inhale 2 puffs into the lungs every 4 (four) hours as needed.      Marland Kitchen aspirin 81 MG tablet Take 81 mg by mouth daily.      . brimonidine (ALPHAGAN) 0.2 % ophthalmic solution Place 1 drop into both eyes 2 (two) times daily.     . cetirizine (ZYRTEC) 10 MG tablet Take 10 mg by mouth daily.    . fluticasone (FLONASE) 50 MCG/ACT nasal spray     . gabapentin (NEURONTIN) 300 MG capsule Take 1 capsule (300 mg total) by mouth 3 (three) times daily. 90 capsule 3  . glipiZIDE (GLUCOTROL XL) 5 MG 24 hr tablet Take 1 tablet (5 mg total) by mouth daily. 30 tablet 11  . JANUVIA 100 MG tablet TAKE 1 TABLET (100 MG TOTAL) BY MOUTH DAILY. 90 tablet 3  . ketoconazole (NIZORAL) 2 % cream     . latanoprost (XALATAN) 0.005 % ophthalmic solution Place 1 drop into both eyes at bedtime.     Marland Kitchen lisinopril (PRINIVIL,ZESTRIL) 20 MG tablet Take 20 mg by mouth daily.      . metFORMIN (GLUCOPHAGE) 1000 MG tablet Take 1,000 mg by mouth 2 (two) times daily with a meal.      . Misc Natural Products (GLUCOSAMINE CHONDROITIN ADV PO) Take 2 tablets by mouth 2 (two) times daily.    . Multiple Vitamin (MULTIVITAMIN) tablet Take 1 tablet by mouth daily.      . pravastatin (PRAVACHOL) 20 MG tablet Take 10 mg by mouth daily.     . sildenafil (REVATIO) 20 MG tablet Take 3-5 tablets (60-100 mg total) by mouth daily as needed. 50 tablet 11  . Vitamin D, Cholecalciferol, 1000 UNITS CAPS Take 1 capsule by mouth  daily.     No current facility-administered medications on file prior to visit.    No Known Allergies  Past Medical History  Diagnosis Date  . Allergic rhinitis   . Diabetes mellitus type II   . Diverticulosis of colon   . HLD (hyperlipidemia)   . HTN (hypertension)   . Glaucoma   . Osteoarthritis   . History of agent Orange exposure   . Hypertrophy of prostate without urinary obstruction and other lower urinary tract symptoms (LUTS)     No past surgical history on file.  Family History  Problem Relation Age of Onset  . Colon cancer Father   . Stroke Mother   . Heart attack Mother   . Hypertension      several siblings  . Diabetes      several siblings  . Coronary artery disease Mother   . Coronary artery disease Brother     History   Social History  . Marital Status: Married    Spouse Name: N/A  . Number of Children: N/A  . Years of Education: N/A   Occupational History  . Retired-[employer]    Social History Main Topics  .  Smoking status: Never Smoker   . Smokeless tobacco: Never Used  . Alcohol Use: No  . Drug Use: No  . Sexual Activity: Not on file   Other Topics Concern  . 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 Due to set up home test but hasn't heard from them [facility]--just [facility] educational group Weight is down 10# since last visit    Objective:   Physical Exam  Constitutional: He appears well-nourished. No distress.  Neck: Normal range of motion. Neck supple. No thyromegaly present.  Cardiovascular: Normal rate, regular rhythm and normal heart sounds.  Exam reveals no  gallop.   No murmur heard. Pulmonary/Chest: Effort normal and breath sounds normal. No respiratory distress. He has no wheezes. He has no rales.  Musculoskeletal: He exhibits no edema.  Lymphadenopathy:    He has no cervical adenopathy.  Psychiatric: He has a normal mood and affect. His behavior is normal.          Assessment & Plan:

## 2014-10-13 NOTE — Assessment & Plan Note (Signed)
Has been adjusting meds Working on being more healthy and has lost some weight Off the farxiga now

## 2014-10-14 ENCOUNTER — Telehealth: Payer: Self-pay | Admitting: Internal Medicine

## 2014-10-14 NOTE — Telephone Encounter (Signed)
[phone], pt cb

## 2014-10-14 NOTE — Telephone Encounter (Signed)
Lmtcb.  [facility] - do you know when this will be set up?  Order was entered back in April 2016.

## 2014-10-14 NOTE — Telephone Encounter (Signed)
lmtcb Lucas [name] ° °

## 2014-10-15 NOTE — Telephone Encounter (Signed)
lmtcb Lucas [name] ° °

## 2014-10-16 NOTE — Telephone Encounter (Signed)
Sleep study scheduled 11/16/14 pt is aware and sleep packet mailed to pt Lucas [name]

## 2014-10-28 DIAGNOSIS — H401222 Low-tension glaucoma, left eye, moderate stage: Secondary | ICD-10-CM | POA: Diagnosis not present

## 2014-10-28 DIAGNOSIS — H2513 Age-related nuclear cataract, bilateral: Secondary | ICD-10-CM | POA: Diagnosis not present

## 2014-10-28 DIAGNOSIS — E119 Type 2 diabetes mellitus without complications: Secondary | ICD-10-CM | POA: Diagnosis not present

## 2014-11-05 ENCOUNTER — Ambulatory Visit (INDEPENDENT_AMBULATORY_CARE_PROVIDER_SITE_OTHER): Payer: Medicare Other | Admitting: Internal Medicine

## 2014-11-05 ENCOUNTER — Encounter: Payer: Self-pay | Admitting: Internal Medicine

## 2014-11-05 VITALS — BP 128/64 | HR 94 | Temp 97.7°F | Ht 73.0 in | Wt 226.0 lb

## 2014-11-05 DIAGNOSIS — E1165 Type 2 diabetes mellitus with hyperglycemia: Secondary | ICD-10-CM

## 2014-11-05 LAB — POCT GLYCOSYLATED HEMOGLOBIN (HGB A1C): Hemoglobin A1C: 6.7

## 2014-11-05 NOTE — Patient Instructions (Addendum)
Your HbA1c is 6.9%!  Please continue: - Metformin 1000 mg 2x a day, with meals - Glipizide XL 5 mg 1/2 tablet 2x a day, before meals - Januvia 100 mg in am, before breakfast  Please return in 3 months with your sugar log.   PATIENT INSTRUCTIONS FOR TYPE 2 DIABETES:  **Please join MyChart!** - see attached instructions about how to join if you have not done so already.  DIET AND EXERCISE Diet and exercise is an important part of diabetic treatment.  We recommended aerobic exercise in the form of brisk walking (working between 40-60% of maximal aerobic capacity, similar to brisk walking) for 150 minutes per week (such as 30 minutes five days per week) along with 3 times per week performing 'resistance' training (using various gauge rubber tubes with handles) 5-10 exercises involving the major muscle groups (upper body, lower body and core) performing 10-15 repetitions (or near fatigue) each exercise. Start at half the above goal but build slowly to reach the above goals. If limited by weight, joint pain, or disability, we recommend daily walking in a swimming pool with water up to waist to reduce pressure from joints while allow for adequate exercise.    BLOOD GLUCOSES Monitoring your blood glucoses is important for continued management of your diabetes. Please check your blood glucoses 2-4 times a day: fasting, before meals and at bedtime (you can rotate these measurements - e.g. one day check before the 3 meals, the next day check before 2 of the meals and before bedtime, etc.).   HYPOGLYCEMIA (low blood sugar) Hypoglycemia is usually a reaction to not eating, exercising, or taking too much insulin/ other diabetes drugs.  Symptoms include tremors, sweating, hunger, confusion, headache, etc. Treat IMMEDIATELY with 15 grams of Carbs: . 4 glucose tablets .  cup regular juice/soda . 2 tablespoons raisins . 4 teaspoons sugar . 1 tablespoon honey Recheck blood glucose in 15 mins and repeat  above if still symptomatic/blood glucose <100.  RECOMMENDATIONS TO REDUCE YOUR RISK OF DIABETIC COMPLICATIONS: * Take your prescribed MEDICATION(S) * Follow a DIABETIC diet: Complex carbs, fiber rich foods, (monounsaturated and polyunsaturated) fats * AVOID saturated/trans fats, high fat foods, >2,300 mg salt per day. * EXERCISE at least 5 times a week for 30 minutes or preferably daily.  * DO NOT SMOKE OR DRINK more than 1 drink a day. * Check your FEET every day. Do not wear tightfitting shoes. Contact us if you develop an ulcer * See your EYE doctor once a year or more if needed * Get a FLU shot once a year * Get a PNEUMONIA vaccine once before and once after age 67 years  GOALS:  * Your Hemoglobin A1c of <7%  * fasting sugars need to be <130 * after meals sugars need to be <180 (2h after you start eating) * Your Systolic BP should be 614 or lower  * Your Diastolic BP should be 80 or lower  * Your HDL (Good Cholesterol) should be 40 or higher  * Your LDL (Bad Cholesterol) should be 100 or lower. * Your Triglycerides should be 150 or lower  * Your Urine microalbumin (kidney function) should be <30 * Your Body Mass Index should be 25 or lower    Please consider the following ways to cut down carbs and fat and increase fiber and micronutrients in your diet: - substitute whole grain for white bread or pasta - substitute [place] rice for white rice - substitute 90-calorie flat bread pieces for  slices of bread when possible - substitute sweet potatoes or yams for white potatoes - substitute humus for margarine - substitute tofu for cheese when possible - substitute almond or rice milk for regular milk (would not drink soy milk daily due to concern for soy estrogen influence on breast cancer risk) - substitute dark chocolate for other sweets when possible - substitute water - can add lemon or orange slices for taste - for diet sodas (artificial sweeteners will trick your body that you  can eat sweets without getting calories and will lead you to overeating and weight gain in the long run) - do not skip breakfast or other meals (this will slow down the metabolism and will result in more weight gain over time)  - can try smoothies made from fruit and almond/rice milk in am instead of regular breakfast - can also try old-fashioned (not instant) oatmeal made with almond/rice milk in am - order the dressing on the side when eating salad at a restaurant (pour less than half of the dressing on the salad) - eat as little meat as possible - can try juicing, but should not forget that juicing will get rid of the fiber, so would alternate with eating raw veg./fruits or drinking smoothies - use as little oil as possible, even when using olive oil - can dress a salad with a mix of balsamic vinegar and lemon juice, for e.g. - use agave nectar, stevia sugar, or regular sugar rather than artificial sweateners - steam or broil/roast veggies  - snack on veggies/fruit/nuts (unsalted, preferably) when possible, rather than processed foods - reduce or eliminate aspartame in diet (it is in diet sodas, chewing gum, etc) Read the labels!  Try to read Dr. Janene Harvey book: "Program for Reversing Diabetes" for other ideas for healthy eating.

## 2014-11-05 NOTE — Progress Notes (Signed)
Patient ID: Lucas [name], male   DOB: [birth year], 74 y.o.   MRN: [id number]  HPI: Lucas [name] is a 74 y.o.-year-old male, referred by his PCP, Dr. Silvio Pate, for management of DM2, dx in 1998, non-insulin-dependent (previous Agent E[place]worth exposure in [place]), uncontrolled, without complications. She saw Dr Howell Rucks before (last visit 09/2014) - she since left practice.  He is in a DM clinical trial: "Jump Start" lifestyle Research Trial June 19th at [place]. His group meets once a month >> counseling sessions - 1 year pgm.   Last hemoglobin A1c was: Lab Results  Component Value Date   HGBA1C 9.1* 06/29/2014   HGBA1C 8.7* 01/13/2014   HGBA1C 7.9* 07/14/2013  Since last HbA1c, he started to improve is diet.  Pt is on a regimen of: - Metformin 1000 mg 2x a day, with meals - Glipizide XL 5 mg 1/2 tab 2x a day - Januvia 100 mg in am He had to stop [place] as K increased (took 1 mo, off for 1.5 mo). Invokana was not covered.  Pt checks his sugars 1x a day and they are: - am: 91, 108-130, 178x1 - 2h after b'fast: n/c - before lunch: n/c - 2h after lunch: n/c - before dinner: n/c - 2h after dinner: n/c - bedtime: n/c - nighttime: n/c No lows. Lowest sugar was 96; he has hypoglycemia awareness at 70.  Highest sugar was 170.  Glucometer: Precision  Pt's meals are: - Breakfast:  Oatmeal, wheat English muffin  - Lunch: salad, 1/2 sandwich - Dinner: meat + veggies, no bread - Snacks: 2x a day, fruit bar, fresh fruit  He walks 30-45 min a day.  He lost ~18 lbs in last year.  - + mild CKD, last BUN/creatinine:  Lab Results  Component Value Date   BUN 24* 10/13/2014   CREATININE 1.21 10/13/2014  On Lisinopril. - last set of lipids: Lab Results  Component Value Date   CHOL 155 06/29/2014   HDL 35.10* 07/14/2013   LDLCALC 88 06/29/2014   LDLDIRECT 75.7 07/02/2012   TRIG 342.0* 07/14/2013   CHOLHDL 5 07/14/2013  On Pravastatin. - last eye exam was in 10/26/2014. No DR.  - no  numbness and tingling in his feet.  Pt has FH of DM in sister, brothers, mother.  He also has a history of hypertension, ED.  ROS: Constitutional: + weight loss, no fatigue, no subjective hyperthermia/hypothermia, + nocturia Eyes: no blurry vision, no xerophthalmia ENT: no sore throat, no nodules palpated in throat, no dysphagia/odynophagia, no hoarseness Cardiovascular: no CP/SOB/palpitations/leg swelling Respiratory: no cough/SOB Gastrointestinal: no N/V/D/C Musculoskeletal: no muscle/joint aches Skin: no rashes Neurological: no tremors/numbness/tingling/dizziness Psychiatric: no depression/anxiety, + PTSD + low libido  Past Medical History  Diagnosis Date  . Allergic rhinitis   . Diabetes mellitus type II   . Diverticulosis of colon   . HLD (hyperlipidemia)   . HTN (hypertension)   . Glaucoma   . Osteoarthritis   . History of agent Orange exposure   . Hypertrophy of prostate without urinary obstruction and other lower urinary tract symptoms (LUTS)    No past surgical history on file.   History   Social History  . Marital Status: Married    Spouse Name: N/A  . Number of Children: 2   Occupational History  . Retired-[employer]    Social History Main Topics  . Smoking status: Never Smoker   . Smokeless tobacco: Never Used  . Alcohol Use: No  . Drug Use: No  [place] vet- 20% disability from shrapnel wounds 1967 and 20% agent orange   Married (Widowed 1996; remarried 6/03)   Retired [employer]      Has living will   Wife, then daughter Hoyle Barr will be health care POA   Would want attempts at resuscitation   Would not want feeding tube if cognitively unaware   Current Outpatient Prescriptions on File Prior to Visit  Medication Sig Dispense Refill  . albuterol (VENTOLIN HFA) 108 (90 BASE) MCG/ACT inhaler Inhale 2 puffs into the lungs every 4 (four) hours as needed.      Marland Kitchen aspirin 81 MG tablet Take 81 mg by mouth daily.      . brimonidine (ALPHAGAN) 0.2 % ophthalmic  solution Place 1 drop into both eyes 2 (two) times daily.     . cetirizine (ZYRTEC) 10 MG tablet Take 10 mg by mouth daily.    . fluticasone (FLONASE) 50 MCG/ACT nasal spray     . gabapentin (NEURONTIN) 300 MG capsule Take 1 capsule (300 mg total) by mouth 3 (three) times daily. 90 capsule 3  . glipiZIDE (GLUCOTROL XL) 5 MG 24 hr tablet Take 1 tablet (5 mg total) by mouth daily. 30 tablet 11  . JANUVIA 100 MG tablet TAKE 1 TABLET (100 MG TOTAL) BY MOUTH DAILY. 90 tablet 3  . ketoconazole (NIZORAL) 2 % cream     . latanoprost (XALATAN) 0.005 % ophthalmic solution Place 1 drop into both eyes at bedtime.     Marland Kitchen lisinopril (PRINIVIL,ZESTRIL) 20 MG tablet Take 20 mg by mouth daily.      . metFORMIN (GLUCOPHAGE) 1000 MG tablet Take 1,000 mg by mouth 2 (two) times daily with a meal.      . Misc Natural Products (GLUCOSAMINE CHONDROITIN ADV PO) Take 2 tablets by mouth 2 (two) times daily.    . Multiple Vitamin (MULTIVITAMIN) tablet Take 1 tablet by mouth daily.      . pravastatin (PRAVACHOL) 20 MG tablet Take 10 mg by mouth daily.     . sildenafil (REVATIO) 20 MG tablet Take 3-5 tablets (60-100 mg total) by mouth daily as needed. 50 tablet 11  . Vitamin D, Cholecalciferol, 1000 UNITS CAPS Take 1 capsule by mouth daily.     No current facility-administered medications on file prior to visit.   No Known Allergies Family History  Problem Relation Age of Onset  . Colon cancer Father   . Stroke Mother   . Heart attack Mother   . Hypertension      several siblings  . Diabetes      several siblings  . Coronary artery disease Mother   . Coronary artery disease Brother    PE: BP 128/64 mmHg  Pulse 94  Temp(Src) 97.7 F (36.5 C) (Oral)  Ht 6\' 1"  (1.854 m)  Wt 226 lb (102.513 kg)  BMI 29.82 kg/m2  SpO2 97% Wt Readings from Last 3 Encounters:  11/05/14 226 lb (102.513 kg)  10/13/14 227 lb (102.967 kg)  09/16/14 226 lb 8 oz (102.74 kg)   Constitutional: overweight, in NAD Eyes: PERRLA, EOMI,  no exophthalmos ENT: moist mucous membranes, no thyromegaly, no cervical lymphadenopathy Cardiovascular: RRR, No MRG Respiratory: CTA B Gastrointestinal: abdomen soft, NT, ND, BS+ Musculoskeletal: no deformities, strength intact in all 4 Skin: moist, warm, no rashes Neurological: no tremor with outstretched hands, DTR normal in all 4  ASSESSMENT: 1. DM2, non-insulin-dependent, uncontrolled, without complications  PLAN:  1. Patient with long-standing, uncontrolled diabetes, on oral antidiabetic regimen, now  with better control after he started to improve his diet and activity. We checked a HbA1c today and it decreased to 6.9%! - I suggested to continue current regimen:  Patient Instructions  Your HbA1c is 6.9%!  Please continue: - Metformin 1000 mg 2x a day, with meals - Glipizide XL 5 mg 1/2 tablet 2x a day, before meals - Januvia 100 mg in am, before breakfast  Please return in 3 months with your sugar log.   - continue checking sugars at different times of the day - check once a day, rotating checks - given sugar log and advised how to fill it and to bring it at next appt  - given foot care handout and explained the principles  - given instructions for hypoglycemia management "15-15 rule"  - advised for yearly eye exams >> he is UTD - Return to clinic in 3 mo with sugar log   - time spent with the patient: 40 min, of which >50% was spent in obtaining information about his diabetes, reviewing his previous labs, evaluations, and treatments, counseling him about his condition (please see the discussed topics above); he had a number of questions which I addressed.

## 2014-11-05 NOTE — Addendum Note (Signed)
Addended by: Moody Bruins E on: 11/05/2014 12:59 PM   Modules accepted: Orders

## 2014-11-09 ENCOUNTER — Emergency Department (HOSPITAL_COMMUNITY)
Admission: EM | Admit: 2014-11-09 | Discharge: 2014-11-09 | Disposition: A | Discharge status: Home / Self Care | Payer: Medicare Other | Attending: Emergency Medicine | Admitting: Emergency Medicine

## 2014-11-09 ENCOUNTER — Encounter (HOSPITAL_COMMUNITY): Payer: Self-pay

## 2014-11-09 DIAGNOSIS — R404 Transient alteration of awareness: Secondary | ICD-10-CM | POA: Diagnosis not present

## 2014-11-09 DIAGNOSIS — I10 Essential (primary) hypertension: Secondary | ICD-10-CM | POA: Insufficient documentation

## 2014-11-09 DIAGNOSIS — Z8739 Personal history of other diseases of the musculoskeletal system and connective tissue: Secondary | ICD-10-CM | POA: Diagnosis not present

## 2014-11-09 DIAGNOSIS — E860 Dehydration: Secondary | ICD-10-CM | POA: Diagnosis not present

## 2014-11-09 DIAGNOSIS — R55 Syncope and collapse: Secondary | ICD-10-CM | POA: Diagnosis present

## 2014-11-09 DIAGNOSIS — H409 Unspecified glaucoma: Secondary | ICD-10-CM | POA: Insufficient documentation

## 2014-11-09 DIAGNOSIS — E119 Type 2 diabetes mellitus without complications: Secondary | ICD-10-CM | POA: Diagnosis not present

## 2014-11-09 DIAGNOSIS — R638 Other symptoms and signs concerning food and fluid intake: Secondary | ICD-10-CM

## 2014-11-09 DIAGNOSIS — R531 Weakness: Secondary | ICD-10-CM | POA: Diagnosis not present

## 2014-11-09 LAB — CBC
HCT: 36.6 % — ABNORMAL LOW (ref 39.0–52.0)
Hemoglobin: 11.9 g/dL — ABNORMAL LOW (ref 13.0–17.0)
MCH: 27.7 pg (ref 26.0–34.0)
MCHC: 32.5 g/dL (ref 30.0–36.0)
MCV: 85.3 fL (ref 78.0–100.0)
Platelets: 194 10*3/uL (ref 150–400)
RBC: 4.29 MIL/uL (ref 4.22–5.81)
RDW: 14 % (ref 11.5–15.5)
WBC: 5 10*3/uL (ref 4.0–10.5)

## 2014-11-09 LAB — BASIC METABOLIC PANEL
Anion gap: 6 (ref 5–15)
BUN: 27 mg/dL — ABNORMAL HIGH (ref 6–20)
CO2: 24 mmol/L (ref 22–32)
Calcium: 8.6 mg/dL — ABNORMAL LOW (ref 8.9–10.3)
Chloride: 106 mmol/L (ref 101–111)
Creatinine, Ser: 1.52 mg/dL — ABNORMAL HIGH (ref 0.61–1.24)
GFR calc Af Amer: 51 mL/min — ABNORMAL LOW (ref >60)
GFR calc non Af Amer: 44 mL/min — ABNORMAL LOW (ref >60)
Glucose, Bld: 179 mg/dL — ABNORMAL HIGH (ref 65–99)
Potassium: 4.8 mmol/L (ref 3.5–5.1)
Sodium: 136 mmol/L (ref 135–145)

## 2014-11-09 LAB — CBG MONITORING, ED: Glucose-Capillary: 168 mg/dL — ABNORMAL HIGH (ref 65–99)

## 2014-11-09 MED ORDER — SODIUM CHLORIDE 0.9 % IV BOLUS (SEPSIS)
1000.0000 mL | Freq: Once | INTRAVENOUS | Status: AC
  Administered 2014-11-09: 1000 mL via INTRAVENOUS

## 2014-11-09 NOTE — Discharge Instructions (Signed)
Syncope °Syncope means a person passes out (faints). The person usually wakes up in less than 5 minutes. It is important to seek medical care for syncope. °HOME CARE °· Have someone stay with you until you feel normal. °· Do not drive, use machines, or play sports until your doctor says it is okay. °· Keep all doctor visits as told. °· Lie down when you feel like you might pass out. Take deep breaths. Wait until you feel normal before standing up. °· Drink enough fluids to keep your pee (urine) clear or pale yellow. °· If you take blood pressure or heart medicine, get up slowly. Take several minutes to sit and then stand. °GET HELP RIGHT AWAY IF:  °· You have a severe headache. °· You have pain in the chest, belly (abdomen), or back. °· You are bleeding from the mouth or butt (rectum). °· You have black or tarry poop (stool). °· You have an irregular or very fast heartbeat. °· You have pain with breathing. °· You keep passing out, or you have shaking (seizures) when you pass out. °· You pass out when sitting or lying down. °· You feel confused. °· You have trouble walking. °· You have severe weakness. °· You have vision problems. °If you fainted, call your local emergency services (911 in [place] Do not drive yourself to the hospital. °MAKE SURE YOU:  °· Understand these instructions. °· Will watch your condition. °· Will get help right away if you are not doing well or get worse. °Document Released: 09/13/2007 Document Revised: 09/26/2011 Document Reviewed: 05/26/2011 °ExitCare® Patient Information ©2015 [employer]. This information is not intended to replace advice given to you by your health care provider. Make sure you discuss any questions you have with your health care provider. ° °

## 2014-11-09 NOTE — ED Notes (Signed)
Pt. Presents following episode of near syncope/weakness/blurred vision/diaphoresis while outside today. Pt. Denies dizziness or weakness at this time. Pt. Denies pain. Pt. Did not fall, he is AxO x4. Pt. Found to be hypotensive by EMS at 84/54, received 800 ml NS in route. BP now 188/70. CBG 158.

## 2014-11-09 NOTE — ED Provider Notes (Signed)
History   Chief Complaint  Patient presents with  . Near Syncope    HPI 74 year old male past medical history as below notable for hypertension and diabetes who presents to ED after syncopal episode. Patient reports he was at a ropes course. Patient says he has never done this before. He says his usual physical abilities was walking down the street and today he was at a rubs course where he was exerting himself much more than he normally does. Patient reports he got very sweaty and lightheaded and had blurred vision. Bystanders noted that he had a syncopal episode which was estimated to be about 4 minutes. Patient does not remember this. EMS was called and patient's blood pressure was 85 systolic on arrival he received IV fluids. Initial screening EKG showed minimal ST elevation in inferior leads without supple changes. After fluids patient's blood pressure was normalized and his EKG changes were no longer as significant. Patient reports he feels much better now and has no other symptoms. He denies having prior pain, shortness of breath before this episode. Prior to today he was in his usual state of health. He denies any cardiac or neurologic history in the past. No other complaints at this time. No h/o syncope in past. No family hx of sudden death.  Past medical/surgical history, social history, medications, allergies and FH have been reviewed with patient and/or in documentation. Furthermore, if pt family or friend(s) present, additional historical information was obtained from them.  Past Medical History  Diagnosis Date  . Allergic rhinitis   . Diabetes mellitus type II   . Diverticulosis of colon   . HLD (hyperlipidemia)   . HTN (hypertension)   . Glaucoma   . Osteoarthritis   . History of agent Orange exposure   . Hypertrophy of prostate without urinary obstruction and other lower urinary tract symptoms (LUTS)    History reviewed. No pertinent past surgical history. Family History   Problem Relation Age of Onset  . Colon cancer Father   . Stroke Mother   . Heart attack Mother   . Hypertension      several siblings  . Diabetes      several siblings  . Coronary artery disease Mother   . Coronary artery disease Brother    History  Substance Use Topics  . Smoking status: Never Smoker   . Smokeless tobacco: Never Used  . Alcohol Use: No     Review of Systems Constitutional: - F/C, -fatigue.  HENT: - congestion, -rhinorrhea, -sore throat.   Eyes: - eye pain, -visual disturbance.  Respiratory: - cough, -SOB, -hemoptysis.   Cardiovascular: - CP, -palps.  Gastrointestinal: - N/V/D, -abd pain  Genitourinary: - flank pain, -dysuria, -frequency.  Musculoskeletal: - myalgia/arthritis, -joint swelling, -gait abnormality, -back pain, -neck pain/stiffness, -leg pain/swelling.  Skin: - rash/lesion.  Neurological: - focal weakness, +lightheadedness, -dizziness, -numbness, -HA. +syncope All other systems reviewed and are negative.   Physical Exam  Physical Exam  ED Triage Vitals  Enc Vitals Group     BP 11/09/14 1541 109/56 mmHg     Pulse Rate 11/09/14 1541 81     Resp 11/09/14 1541 16     Temp 11/09/14 1541 97.9 F (36.6 C)     Temp Source 11/09/14 1541 Oral     SpO2 11/09/14 1530 98 %     Weight 11/09/14 1537 226 lb (102.513 kg)     Height 11/09/14 1537 6\' 1"  (1.854 m)     Head Cir --  Peak Flow --      Pain Score 11/09/14 1535 0     Pain Loc --      Pain Edu? --      Excl. in [place]? --    Constitutional: Patient is well appearing and in no acute distress Head: Normocephalic and atraumatic.  Eyes: Extraocular motion intact, no scleral icterus Mouth: MMM, OP clear Neck: Supple without meningismus, mass, or overt JVD Respiratory: No respiratory distress. Normal WOB. No w/r/g. CV: RRR, no obvious murmurs.  Pulses +2 and symmetric. Euvolemic Abdomen: Soft, NT, ND, no r/g. No mass.  MSK: Extremities are atraumatic without deformity, ROM intact Skin:  Warm, dry, intact without rash Neuro: HDS, AAOx4. PERRL, EOMI, TML, face sym. CN 2-12 grossly intact. 5/5 sym, no drift, SILT, normal gait and coordination.   ED Course  Procedures   Labs Reviewed  BASIC METABOLIC PANEL  CBC  CBG MONITORING, ED   I personally reviewed and interpreted all labs.  No orders to display   I personally viewed above image(s) which were used in my medical decision making. Formal interpretations by Radiology.  MDM: SAHAN PEN is a 74 y.o. male with H&P as above who p/w CC: syncope  EKG shows NSR, normal rate, 0.5 mm STE in inf leads (concave) without reciprocal changes and no other signs of acute ischemia.   No family h/o sudden cardiac death, h/o CAD/MI, anemia/dark stools, CP/SOB/palpitations, HA, abd pain, seizure activity, weakness.   Exam without signs of conjunctival pallor, head injury. Abd soft, ND, no pulsatile mass.   Clinical picture c/f likely dehydration state as cause.   Neg [place] Syncope rule: C - CHF hx H - Hct <30 E - Abnormal EKG - No signs of WPW, Brugada, Long QT, HOCM S - SOB S- Systolic < 90  Pt labs c/w dehydration. 1L NS given in addition to EMS IVF.  5:41 PM Pt reports feeling 100% better now.  No ectopy on monitor. No syncope in ED.   Discussed at length with patient their low risk of having a serious life-threatening cause (death, myocardial infarction, arrhythmia, pulmonary embolism, stroke, subarachnoid hemorrhage, significant hemorrhage, or any condition causing a return ED visit and hospitalization for a related event) of pt's syncopal episode based on pt's unremarkable labs and EKG in the ED. Advised pt to follow closely with her PCP. Deemed stable for discharge.  Old records reviewed (if available). Labs and imaging reviewed personally by myself and considered in medical decision making if ordered. -Disposition: stable for d/c.  Clinical Impression: 1. Syncope, unspecified syncope type   2.  Dehydration symptoms     Disposition: Discharge  Condition: Good  I have discussed the results, Dx and Tx plan with the pt(& family if present). He/she/they expressed understanding and agree(s) with the plan. Discharge instructions discussed at great length. Strict return precautions discussed and pt &/or family have verbalized understanding of the instructions. No further questions at time of discharge.    New Prescriptions   No medications on file    Follow Up: [name], MD [place] [address] [phone]  Schedule an appointment as soon as possible for a visit in 1 week   [place] [address] [id number] [place] [phone]  If symptoms worsen   Pt seen in conjunction with Dr. Virgel Manifold, MD  Kirstie Peri, [place] Emergency Medicine Resident - PGY-3     Annie Main  Hanley Ben, MD 11/09/14 [place], MD 11/10/14 [phone]

## 2014-11-10 ENCOUNTER — Telehealth: Payer: Self-pay | Admitting: *Deleted

## 2014-11-10 NOTE — Telephone Encounter (Signed)
Spoke with patient and he's feeling fine, pt fell passed out from dehydration. He will call in a few days to schedule a follow-up.

## 2014-11-16 ENCOUNTER — Ambulatory Visit (HOSPITAL_BASED_OUTPATIENT_CLINIC_OR_DEPARTMENT_OTHER): Payer: Medicare Other | Attending: Internal Medicine | Admitting: Radiology

## 2014-11-16 DIAGNOSIS — G4733 Obstructive sleep apnea (adult) (pediatric): Secondary | ICD-10-CM | POA: Diagnosis not present

## 2014-11-16 DIAGNOSIS — I493 Ventricular premature depolarization: Secondary | ICD-10-CM | POA: Insufficient documentation

## 2014-11-16 DIAGNOSIS — R0683 Snoring: Secondary | ICD-10-CM | POA: Diagnosis not present

## 2014-11-16 DIAGNOSIS — G4700 Insomnia, unspecified: Secondary | ICD-10-CM | POA: Diagnosis not present

## 2014-11-21 DIAGNOSIS — G4733 Obstructive sleep apnea (adult) (pediatric): Secondary | ICD-10-CM | POA: Diagnosis not present

## 2014-11-21 NOTE — Progress Notes (Signed)
Patient Name: Lucas [name], Lucas [name] Date: 11/16/2014 Gender: Male D.O.B: 1940-07-23 Age (years): 77 Referring Provider: Baird Lyons MD, ABSM Height (inches): 71 Interpreting Physician: Baird Lyons MD, ABSM Weight (lbs): 225 RPSGT: Zadie Rhine BMI: 31 MRN: [id number] Neck Size: 17.00 CLINICAL INFORMATION Sleep Study Type: Split Night CPAP     Indication for sleep study: Obstructive sleep apnea     Epworth Sleepiness Score: 6  SLEEP STUDY TECHNIQUE As per the AASM Manual for the Scoring of Sleep and Associated Events v2.3 (April 2016) with a hypopnea requiring 4% desaturations.  The channels recorded and monitored were frontal, central and occipital EEG, electrooculogram (EOG), submentalis EMG (chin), nasal and oral airflow, thoracic and abdominal wall motion, anterior tibialis EMG, snore microphone, electrocardiogram, and pulse oximetry. Continuous positive airway pressure (CPAP) was initiated when the patient met split night criteria and was titrated according to treat sleep-disordered breathing.  MEDICATIONS Medications taken by the patient : Charted for review Medications administered by patient during sleep study : No sleep medicine administered.  RESPIRATORY PARAMETERS Diagnostic  Total AHI (/hr): 13.6 RDI (/hr): 13.6 OA Index (/hr): 1.6 CA Index (/hr): 0.0 REM AHI (/hr): 29.3 NREM AHI (/hr): 7.0 Supine AHI (/hr): 8.9 Non-supine AHI (/hr): 18.57 Min O2 Sat (%): 85.00 Mean O2 (%): 93.95 Time below 88% (min): 1.7   Titration  Optimal Pressure (cm):  AHI at Optimal Pressure (/hr): N/A Min O2 at Optimal Pressure (%): 91.00 Supine % at Optimal (%): N/A Sleep % at Optimal (%): N/A   SLEEP ARCHITECTURE The recording time for the entire night was 378.6 minutes.  During a baseline period of 200.5 minutes, the patient slept for 145.5 minutes in REM and nonREM, yielding a sleep efficiency of 72.6%. Sleep onset after lights out was 11.4 minutes with a REM latency of  74.5 minutes. The patient spent 4.12% of the night in stage N1 sleep, 66.32% in stage N2 sleep, 0.00% in stage N3 and 29.55% in REM.  During the titration period of 177.6 minutes, the patient slept for 0.0 minutes in REM and nonREM, yielding a sleep efficiency of 0.0%. Sleep onset after CPAP initiation was N/A minutes with a REM latency of N/A minutes. The patient spent N/A% of the night in stage N1 sleep, N/A% in stage N2 sleep, N/A% in stage N3 and N/A% in REM.  CARDIAC DATA The 2 lead EKG demonstrated sinus rhythm. The mean heart rate was 79.24 beats per minute. Other EKG findings include: PVCs.  LEG MOVEMENT DATA The total Periodic Limb Movements of Sleep (PLMS) were 105. The PLMS index was 43.30 .  IMPRESSIONS Mild obstructive sleep apnea occurred during the diagnostic portion of the study (AHI = 13.6 /hour). An optimal PAP pressure could not be selected for this patient  because the patient failed to regain sleep after CPAP placed.Marland Kitchen He describes similar difficulty with sleep at home. No significant central sleep apnea occurred during the diagnostic portion of the study (CAI = 0.0/hour). The patient had minimal or no oxygen desaturation during the diagnostic portion of the study (Min O2 = 85.00%) The patient snored with Soft snoring volume during the diagnostic portion of the study. EKG findings include PVCs. Moderate periodic limb movements of sleep occurred during the study.  DIAGNOSIS Obstructive Sleep Apnea (327.23 [G47.33 ICD-10]) Insomnia  RECOMMENDATIONS Home CPAP can be based on autotitration Additional assistance for insomnia will probably be needed. Avoid alcohol, sedatives and other CNS depressants that may worsen sleep apnea and disrupt normal sleep architecture. Sleep hygiene  should be reviewed to assess factors that may improve sleep quality. Weight management and regular exercise should be initiated or continued. Return to Sleep Center for re-evaluation after 4 weeks  of therapy  [place], American Board of Sleep Medicine  ELECTRONICALLY SIGNED ON:  11/21/2014, 9:45 AM [place] PH: [phone]   FX: [phone] [place]

## 2014-11-26 ENCOUNTER — Telehealth: Payer: Self-pay | Admitting: Internal Medicine

## 2014-11-26 NOTE — Telephone Encounter (Signed)
Sleep study results have been faxed. Pt is aware. Nothing further was needed.

## 2014-12-07 ENCOUNTER — Telehealth: Payer: Self-pay | Admitting: Internal Medicine

## 2014-12-07 NOTE — Telephone Encounter (Signed)
Called pt and appt scheduled for Wednesday. Nothing further needed

## 2014-12-07 NOTE — Telephone Encounter (Signed)
Please let patient know that CY likes to have patient come back after sleep study; please let patient know that he can come for follow up on Wednesday 12-09-14 at 11:15am slot. Thanks.

## 2014-12-07 NOTE — Telephone Encounter (Signed)
Called spoke with pt. He is requesting an explanation of his sleep study results. He does not have pending appt. Please advise Dr. Annamaria Boots thanks  --results printed with note

## 2014-12-09 ENCOUNTER — Ambulatory Visit (INDEPENDENT_AMBULATORY_CARE_PROVIDER_SITE_OTHER): Payer: Medicare Other | Admitting: Internal Medicine

## 2014-12-09 ENCOUNTER — Encounter: Payer: Self-pay | Admitting: Internal Medicine

## 2014-12-09 VITALS — BP 140/80 | HR 89 | Ht 72.0 in | Wt 230.4 lb

## 2014-12-09 DIAGNOSIS — G4733 Obstructive sleep apnea (adult) (pediatric): Secondary | ICD-10-CM

## 2014-12-09 NOTE — Assessment & Plan Note (Signed)
We had appropriate discussion and questions including We think he can get CPAP provided through the [place] system Recommend new CPAP, auto titrate 5-15, mask of choice, heated humidifier, supplies-DX OSA He would prefer that we follow and manage his CPAP here because we're closer.

## 2014-12-09 NOTE — Patient Instructions (Signed)
I will fax to Dr Michel Bickers at [facility]  to recommend new DME New CPAP autopap 5-20, mask of choice, humidifier, supplies  For dx OSA  Unless the [facility] wants to handle it differently, I will assume I am to follow this problem with you.   Please call as needed

## 2014-12-09 NOTE — Progress Notes (Signed)
07/24/14- [name] never smoker without history of heart or lung disease. Treated for hypertension and diabetes. referred courtesy of Dr Silvio Pate for sleep disordered breathing-never had sleep study; Pt states his fitbit shows he is not sleeping well. Wife has told him he snores; wakes himself as well. Bedtime 10:30 PM, sleep latency 15 minutes, waking 3 times including nocturia 1, before up at 7 AM. Usually feels rested in the day without. 3 cups of coffee. No ENT surgery. Sleep has been affected by sciatica but that is better controlled now. Family history of snoring.   12/09/14- 28 yoM Veteran, never smoker, followed for OSA with insomnia, complicated by HBP, DM 2NPSG  folows for: review of sleep study. pt states hes been doing fine. pt states hes lost about 20 lbs and feeks hes been sleeping better since.  NPSG 11/16/14- mild OSA AHI 13.6/ hr, desat to 85%, weight 225 lbs We discussed basic sleep hygiene, basic physiology of obstructive sleep apnea, available treatments, responsibility to drive safely, effect of weight loss. He has his own teeth and an oral appliance may be an option. He is going to start with CPAP. We think this can be provided through the [place] system. Contact: Dr Durenda Age                Dept [facility]                [address] [place], [place]  [address]      Fax [phone]                  ROS-see HPI   Negative unless "+" Constitutional:    weight loss, night sweats, fevers, chills, fatigue, lassitude. HEENT:    headaches, difficulty swallowing, tooth/dental problems, sore throat,       sneezing, itching, ear ache, nasal congestion, post nasal drip, snoring CV:    chest pain, orthopnea, PND, swelling in lower extremities, anasarca,                                                   dizziness, palpitations Resp:   shortness of breath with exertion or at rest.                productive cough,   non-productive cough,  coughing up of blood.              change in color of mucus.  wheezing.   Skin:    rash or lesions. GI:  No-   heartburn, indigestion, abdominal pain, nausea, vomiting, GU: . MS:   joint pain, stiffness,  Neuro-     nothing unusual Psych:  change in mood or affect.  depression or anxiety.   memory loss.  OBJ- Physical Exam General- Alert, Oriented, Affect-appropriate, Distress- none acute, + mild overweight Skin- rash-none, lesions- none, excoriation- none Lymphadenopathy- none Head- atraumatic            Eyes- Gross vision intact, PERRLA, conjunctivae and secretions clear            Ears- Hearing, canals-normal            Nose- Clear, no-Septal dev, mucus, polyps, erosion, perforation             Throat-  Mallampati II-III, mucosa clear , drainage- none, tonsils- atrophic Neck- flexible , trachea midline, no stridor , thyroid nl, carotid no bruit Chest - symmetrical excursion , unlabored           Heart/CV- RRR , no murmur , no gallop  , no rub, nl s1 s2                           - JVD- none , edema- none, stasis changes- none, varices- none           Lung- clear to P&A, wheeze- none, cough- none , dullness-none, rub- none           Chest wall-  Abd-  Br/ Gen/ Rectal- Not done, not indicated Extrem- cyanosis- none, clubbing, none, atrophy- none, strength- nl Neuro- grossly intact to observation

## 2015-02-01 DIAGNOSIS — E119 Type 2 diabetes mellitus without complications: Secondary | ICD-10-CM | POA: Diagnosis not present

## 2015-02-01 DIAGNOSIS — H401232 Low-tension glaucoma, bilateral, moderate stage: Secondary | ICD-10-CM | POA: Diagnosis not present

## 2015-02-05 ENCOUNTER — Other Ambulatory Visit (INDEPENDENT_AMBULATORY_CARE_PROVIDER_SITE_OTHER): Payer: Medicare Other | Admitting: *Deleted

## 2015-02-05 ENCOUNTER — Ambulatory Visit (INDEPENDENT_AMBULATORY_CARE_PROVIDER_SITE_OTHER): Payer: Medicare Other | Admitting: Internal Medicine

## 2015-02-05 ENCOUNTER — Encounter: Payer: Self-pay | Admitting: Internal Medicine

## 2015-02-05 VITALS — BP 118/62 | HR 81 | Temp 98.1°F | Resp 12 | Wt 233.0 lb

## 2015-02-05 DIAGNOSIS — E1165 Type 2 diabetes mellitus with hyperglycemia: Secondary | ICD-10-CM | POA: Diagnosis not present

## 2015-02-05 LAB — POCT GLYCOSYLATED HEMOGLOBIN (HGB A1C): Hemoglobin A1C: 7.2

## 2015-02-05 NOTE — Patient Instructions (Signed)
Please continue: - Metformin 1000 mg 2x a day, with meals - Glipizide XL 5 mg 1/2 tablet 2x a day, before meals - Januvia 100 mg in am, before breakfast  Please eat fresh fruit at night instead of the fruit juice.  Please return in 3 months with your sugar log.

## 2015-02-05 NOTE — Progress Notes (Signed)
Patient ID: Lucas [name], male   DOB: Feb 17, 1941, 74 y.o.   MRN: [id number]  HPI: Lucas [name] is a 74 y.o.-year-old male, returning for f/u for DM2, dx in 1998, non-insulin-dependent (previous Agent Orange exposure in [place]), uncontrolled, without complications. Last visit 3 mo ago.  Since last visit, he was in the ED with syncope in 11/2014 (dehydration after staying out in the heat).   He is in a DM clinical trial: "Jump Start" lifestyle Research Trial June 19th at [place]. His group meets once a month >> counseling sessions - 1 year pgm.   Last hemoglobin A1c was: Lab Results  Component Value Date   HGBA1C 6.7 11/05/2014   HGBA1C 9.1* 06/29/2014   HGBA1C 8.7* 01/13/2014  Since last HbA1c, he started to improve is diet.  Pt is on a regimen of: - Metformin 1000 mg 2x a day, with meals - Glipizide XL 5 mg 1/2 tab 2x a day - Januvia 100 mg in am He had to stop [place] as K increased (took 1 mo, off for 1.5 mo). Invokana was not covered.  Pt checks his sugars 1x a day and they are higher this week (eating more bread recently and he drinks juice at night): - am: 91, 108-130, 178x1 >> 120-150s, 190 this am - 2h after b'fast: n/c - before lunch: n/c >> 120s - 2h after lunch: n/c - before dinner: n/c >> 130s - 2h after dinner: n/c >> 130-140 - bedtime:  n/c - nighttime: n/c No lows. Lowest sugar was 96; he has hypoglycemia awareness at 70.  Highest sugar was 170 >> 190.  Glucometer: Precision  Pt's meals are: - Breakfast:  Oatmeal, wheat English muffin  - Lunch: salad, 1/2 sandwich - Dinner: meat + veggies, no bread - Snacks: 2x a day, fruit bar, fresh fruit  He walks 30-45 min a day. Usually >9000 steps.   He lost ~18 lbs in last year. He now gained 8 lbs in last 3 mo.  - + mild CKD, last BUN/creatinine:  Lab Results  Component Value Date   BUN 27* 11/09/2014   CREATININE 1.52* 11/09/2014  On Lisinopril. - last set of lipids: Lab Results  Component Value Date   CHOL 155 06/29/2014   HDL 35.10* 07/14/2013   LDLCALC 88 06/29/2014   LDLDIRECT 75.7 07/02/2012   TRIG 342.0* 07/14/2013   CHOLHDL 5 07/14/2013  On Pravastatin. - last eye exam was in 10/26/2014. No DR.  - no numbness and tingling in his feet.  Pt has FH of DM in sister, brothers, mother.  He also has a history of hypertension, ED.  ROS: Constitutional: + weight gain, no fatigue, no subjective hyperthermia/hypothermia, no nocturia Eyes: no blurry vision, no xerophthalmia ENT: no sore throat, no nodules palpated in throat, no dysphagia/odynophagia, no hoarseness Cardiovascular: no CP/SOB/palpitations/leg swelling Respiratory: no cough/SOB Gastrointestinal: no N/V/D/C Musculoskeletal: no muscle/+ joint aches Skin: no rashes Neurological: no tremors/numbness/tingling/dizziness + diff with erections  Past Medical History  Diagnosis Date  . Allergic rhinitis   . Diabetes mellitus type II   . Diverticulosis of colon   . HLD (hyperlipidemia)   . HTN (hypertension)   . Glaucoma   . Osteoarthritis   . History of agent Orange exposure   . Hypertrophy of prostate without urinary obstruction and other lower urinary tract symptoms (LUTS)    No past surgical history on file.   History   Social History  . Marital Status: Married    Spouse Name: N/A  .  Number of Children: 2   Occupational History  . Retired-[employer]    Social History Main Topics  . Smoking status: Never Smoker   . Smokeless tobacco: Never Used  . Alcohol Use: No  . Drug Use: No    [place] vet- 20% disability from shrapnel wounds 1967 and 20% agent orange   Married (Widowed 1996; remarried 6/03)   Retired [employer]      Has living will   Wife, then daughter Hoyle Barr will be health care POA   Would want attempts at resuscitation   Would not want feeding tube if cognitively unaware   Current Outpatient Prescriptions on File Prior to Visit  Medication Sig Dispense Refill  . albuterol (VENTOLIN HFA) 108 (90 BASE)  MCG/ACT inhaler Inhale 2 puffs into the lungs every 4 (four) hours as needed.      Marland Kitchen aspirin 81 MG tablet Take 81 mg by mouth daily.      . brimonidine (ALPHAGAN) 0.2 % ophthalmic solution Place 1 drop into both eyes 2 (two) times daily.     . cetirizine (ZYRTEC) 10 MG tablet Take 10 mg by mouth daily.    . fluticasone (FLONASE) 50 MCG/ACT nasal spray     . gabapentin (NEURONTIN) 300 MG capsule Take 1 capsule (300 mg total) by mouth 3 (three) times daily. 90 capsule 3  . glipiZIDE (GLUCOTROL XL) 5 MG 24 hr tablet Take 1 tablet (5 mg total) by mouth daily. 30 tablet 11  . JANUVIA 100 MG tablet TAKE 1 TABLET (100 MG TOTAL) BY MOUTH DAILY. 90 tablet 3  . ketoconazole (NIZORAL) 2 % cream     . latanoprost (XALATAN) 0.005 % ophthalmic solution Place 1 drop into both eyes at bedtime.     Marland Kitchen lisinopril (PRINIVIL,ZESTRIL) 20 MG tablet Take 5 mg by mouth daily.     . metFORMIN (GLUCOPHAGE) 1000 MG tablet Take 1,000 mg by mouth 2 (two) times daily with a meal.      . Misc Natural Products (GLUCOSAMINE CHONDROITIN ADV PO) Take 2 tablets by mouth 2 (two) times daily.    . Multiple Vitamin (MULTIVITAMIN) tablet Take 1 tablet by mouth daily.      . pravastatin (PRAVACHOL) 20 MG tablet Take 10 mg by mouth daily.     . sildenafil (REVATIO) 20 MG tablet Take 3-5 tablets (60-100 mg total) by mouth daily as needed. 50 tablet 11  . Vitamin D, Cholecalciferol, 1000 UNITS CAPS Take 1 capsule by mouth daily.     No current facility-administered medications on file prior to visit.   No Known Allergies Family History  Problem Relation Age of Onset  . Colon cancer Father   . Stroke Mother   . Heart attack Mother   . Hypertension      several siblings  . Diabetes      several siblings  . Coronary artery disease Mother   . Coronary artery disease Brother    PE: BP 118/62 mmHg  Pulse 81  Temp(Src) 98.1 F (36.7 C) (Oral)  Resp 12  Wt 233 lb (105.688 kg)  SpO2 99% Body mass index is 31.59 kg/(m^2). Wt  Readings from Last 3 Encounters:  02/05/15 233 lb (105.688 kg)  12/09/14 230 lb 6.4 oz (104.509 kg)  11/16/14 225 lb (102.059 kg)   Constitutional: overweight, in NAD Eyes: PERRLA, EOMI, no exophthalmos ENT: moist mucous membranes, no thyromegaly, no cervical lymphadenopathy Cardiovascular: RRR, No MRG Respiratory: CTA B Gastrointestinal: abdomen soft, NT, ND, BS+ Musculoskeletal: no deformities, strength  intact in all 4 Skin: moist, warm, rashes Neurological: no tremor with outstretched hands, DTR normal in all 4  ASSESSMENT: 1. DM2, non-insulin-dependent, uncontrolled, without complications  PLAN:  1. Patient with long-standing, uncontrolled diabetes, on oral antidiabetic regimen, now with better control after he started to improve his diet and activity. Last HbA1c decreased under 7, but sugars now higher in am, I believe 2/2 fruit juice that he drinks at night >> advised to stop. - I suggested to continue current regimen:  Patient Instructions  Please continue: - Metformin 1000 mg 2x a day, with meals - Glipizide XL 5 mg 1/2 tablet 2x a day, before meals - Januvia 100 mg in am, before breakfast  Please eat fresh fruit at night instead of the fruit juice.  Please return in 3 months with your sugar log.   - continue checking sugars at different times of the day - check once a day, rotating checks - advised for yearly eye exams >> he is UTD - had the flu shot at [facility] HbA1c today >> 7.2% (higher!) - Return to clinic in 3 mo with sugar log

## 2015-02-10 ENCOUNTER — Telehealth: Payer: Self-pay | Admitting: Internal Medicine

## 2015-02-10 DIAGNOSIS — G4733 Obstructive sleep apnea (adult) (pediatric): Secondary | ICD-10-CM

## 2015-02-10 NOTE — Telephone Encounter (Signed)
Spoke with pt, states he was written a rx at last ov for a cpap.  Pt states that at his request this info was sent to the [place] in [place]. States that he went to [facility] [place] doctor 3 weeks ago, who referred him to sleep center in [place].  Pt has been unsuccessful in getting in contact with sleep center and is wanting to start cpap therapy.  Pt is requesting that we go ahead and order cpap through a DME provider instead of through the [place].  Pt also requesting to move back appt to December as he will be out of town on 11/18 when his appt is.    CY please advise if ok to order cpap as prescribed at last ov through a DME company, and where pt's rov can be pushed back to.  Thanks!

## 2015-02-10 NOTE — Telephone Encounter (Signed)
Spoke with pt, aware of results.  Ov rescheduled.  Order placed for cpap.  Nothing further needed.

## 2015-02-10 NOTE — Telephone Encounter (Signed)
Yes ok to switch CPAP as ordered to a DME for Dx OSA  Ok to reschedule him into December

## 2015-02-26 ENCOUNTER — Ambulatory Visit: Payer: Medicare Other | Admitting: Internal Medicine

## 2015-03-18 ENCOUNTER — Ambulatory Visit (INDEPENDENT_AMBULATORY_CARE_PROVIDER_SITE_OTHER): Payer: Medicare Other | Admitting: Internal Medicine

## 2015-03-18 ENCOUNTER — Encounter: Payer: Self-pay | Admitting: Internal Medicine

## 2015-03-18 VITALS — BP 122/78 | HR 77 | Ht 72.0 in | Wt 234.4 lb

## 2015-03-18 DIAGNOSIS — G4733 Obstructive sleep apnea (adult) (pediatric): Secondary | ICD-10-CM

## 2015-03-18 NOTE — Patient Instructions (Signed)
We can continue CPAP 5-20 auto, [facility]  When you go to [facility], ask them how to adjust your humidifier to help with dry mouth

## 2015-03-18 NOTE — Progress Notes (Signed)
07/24/14- 74 yoM never smoker without history of heart or lung disease. Treated for hypertension and diabetes. referred courtesy of Dr Silvio Pate for sleep disordered breathing-never had sleep study; Pt states his fitbit shows he is not sleeping well. Wife has told him he snores; wakes himself as well. Bedtime 10:30 PM, sleep latency 15 minutes, waking 3 times including nocturia 1, before up at 7 AM. Usually feels rested in the day without. 3 cups of coffee. No ENT surgery. Sleep has been affected by sciatica but that is better controlled now. Family history of snoring.  12/09/14- 74 yoM Veteran, never smoker, followed for OSA with insomnia, complicated by HBP, DM 2 folows for: review of sleep study. pt states hes been doing fine. pt states hes lost about 20 lbs and feeks hes been sleeping better since.  NPSG 11/16/14- mild OSA AHI 13.6/ hr, desat to 85%, weight 225 lbs We discussed basic sleep hygiene, basic physiology of obstructive sleep apnea, available treatments, responsibility to drive safely, effect of weight loss. He has his own teeth and an oral appliance may be an option. He is going to start with CPAP. We think this can be provided through the [place] system. Contact: Dr Durenda Age                Dept [facility]                [address] [place], [place]  [address]      Fax [phone]  03/18/2015-74 year old male Veteran, never smoker, followed for OSA with insomnia, complicated by HBP, DM 2 FOLLOWS FOR: DME: [facility]; pt wears CPAP every night for at least 7 hours; pt's tubing broke and plans on going by [facility] today to get new one.  CPAP/ auto 5-15/[facility] Download shows acceptable compliance and control. He admits he is sleeping better and feeling better using CPAP                 ROS-see HPI   Negative unless "+" Constitutional:    weight loss, night sweats, fevers, chills, fatigue, lassitude. HEENT:    headaches, difficulty swallowing,  tooth/dental problems, sore throat,       sneezing, itching, ear ache, nasal congestion, post nasal drip, snoring CV:    chest pain, orthopnea, PND, swelling in lower extremities, anasarca,                                                   dizziness, palpitations Resp:   shortness of breath with exertion or at rest.                productive cough,   non-productive cough, coughing up of blood.              change in color of mucus.  wheezing.   Skin:    rash or lesions. GI:  No-   heartburn, indigestion, abdominal pain, nausea, vomiting, GU: . MS:   joint pain, stiffness,  Neuro-     nothing unusual Psych:  change in mood or affect.  depression or anxiety.   memory loss.  OBJ- Physical Exam General- Alert, Oriented, Affect-appropriate, Distress- none acute, + mild overweight Skin- rash-none, lesions- none, excoriation- none Lymphadenopathy- none Head- atraumatic  Eyes- Gross vision intact, PERRLA, conjunctivae and secretions clear            Ears- Hearing, canals-normal            Nose- Clear, no-Septal dev, mucus, polyps, erosion, perforation             Throat- Mallampati II-III, mucosa clear , drainage- none, tonsils- atrophic Neck- flexible , trachea midline, no stridor , thyroid nl, carotid no bruit Chest - symmetrical excursion , unlabored           Heart/CV- RRR , no murmur , no gallop  , no rub, nl s1 s2                           - JVD- none , edema- none, stasis changes- none, varices- none           Lung- clear to P&A, wheeze- none, cough- none , dullness-none, rub- none           Chest wall-  Abd-  Br/ Gen/ Rectal- Not done, not indicated Extrem- cyanosis- none, clubbing, none, atrophy- none, strength- nl Neuro- grossly intact to observation

## 2015-03-21 NOTE — Assessment & Plan Note (Signed)
Initial download shows satisfactory compliance and control. He describes definite improved quality of life using CPAP. Plan-educated goals, comfort, compliance

## 2015-05-03 DIAGNOSIS — E119 Type 2 diabetes mellitus without complications: Secondary | ICD-10-CM | POA: Diagnosis not present

## 2015-05-03 DIAGNOSIS — H401232 Low-tension glaucoma, bilateral, moderate stage: Secondary | ICD-10-CM | POA: Diagnosis not present

## 2015-05-03 LAB — HM DIABETES EYE EXAM

## 2015-05-07 ENCOUNTER — Encounter: Payer: Self-pay | Admitting: Internal Medicine

## 2015-05-07 ENCOUNTER — Ambulatory Visit (INDEPENDENT_AMBULATORY_CARE_PROVIDER_SITE_OTHER): Payer: Medicare Other | Admitting: Internal Medicine

## 2015-05-07 VITALS — BP 114/62 | HR 79 | Temp 98.4°F | Resp 12 | Wt 233.0 lb

## 2015-05-07 DIAGNOSIS — E1165 Type 2 diabetes mellitus with hyperglycemia: Secondary | ICD-10-CM | POA: Diagnosis not present

## 2015-05-07 NOTE — Progress Notes (Signed)
Patient ID: CRUIZ IMPERIAL, male   DOB: December 05, 1940, 75 y.o.   MRN: [id number]  HPI: Lucas [name] is a 75 y.o.-year-old male, returning for f/u for DM2, dx in 1998, non-insulin-dependent (previous Agent Orange exposure in [place]), uncontrolled, without complications. Last visit 3 mo ago.  He is in a DM clinical trial: "Jump Start" lifestyle Research Trial June 19th at [place]. His group meets once a month >> counseling sessions - 1 year pgm.   He had some higher sugar readings over the Holidays.   Last hemoglobin A1c was: 04/15/2015: 7.6%  Lab Results  Component Value Date   HGBA1C 7.2 02/05/2015   HGBA1C 6.7 11/05/2014   HGBA1C 9.1* 06/29/2014  Since last HbA1c, he started to improve is diet.  Pt is on a regimen of: - Metformin 1000 mg 2x a day, with meals - Glipizide XL 5 mg 1/2 tab 2x a day - Januvia 100 mg in am He had to stop [place] as K increased (took 1 mo, off for 1.5 mo). Invokana was not covered.  Pt checks his sugars 1-3x a day: - am: 91, 108-130, 178x1 >> 120-150s, 190 this am >> 133-176 - 2h after b'fast: n/c >> 196 - before lunch: n/c >> 120s >> n/c - 2h after lunch: n/c >> 76-129 - before dinner: n/c >> 130s >> 77, 105-145, 165 - 2h after dinner: n/c >> 130-140 >> n/c - bedtime:  N/c >> 139-157, 208 - nighttime: n/c No lows. Lowest sugar was 96; he has hypoglycemia awareness at 70.  Highest sugar was 170 >> 190 >> 208  Glucometer: Precision  Pt's meals are: - Breakfast:  Oatmeal, wheat English muffin  - Lunch: salad, 1/2 sandwich - Dinner: meat + veggies, no bread - Snacks: 2x a day, fruit bar, fresh fruit  He walks 30-45 min a day.   - + mild CKD, last BUN/creatinine:  04/15/2015: GFR 88.1, Cr 1.06 Lab Results  Component Value Date   BUN 27* 11/09/2014   CREATININE 1.52* 11/09/2014  On Lisinopril. - last set of lipids: 04/15/2015: LDL 89 Lab Results  Component Value Date   CHOL 155 06/29/2014   HDL 35.10* 07/14/2013   LDLCALC 88 06/29/2014   LDLDIRECT 75.7 07/02/2012   TRIG 342.0* 07/14/2013   CHOLHDL 5 07/14/2013  On Pravastatin. - last eye exam was in 05/03/2015. No DR. He has non-tension glaucoma. - no numbness and tingling in his feet.  He also has a history of hypertension, ED.  ROS: Constitutional: no weight gain, no fatigue, no subjective hyperthermia/hypothermia, no nocturia Eyes:+ degrading vision in L eye; blurry vision, no xerophthalmia ENT: no sore throat, no nodules palpated in throat, no dysphagia/odynophagia, no hoarseness Cardiovascular: no CP/SOB/palpitations/leg swelling Respiratory: no cough/SOB Gastrointestinal: no N/V/D/C Musculoskeletal: + muscle/+ joint aches Skin: no rashes Neurological: no tremors/numbness/tingling/dizziness  I reviewed pt's medications, allergies, PMH, social hx, family hx, and changes were documented in the history of present illness. Otherwise, unchanged from my initial visit note.  Past Medical History  Diagnosis Date  . Allergic rhinitis   . Diabetes mellitus type II   . Diverticulosis of colon   . HLD (hyperlipidemia)   . HTN (hypertension)   . Glaucoma   . Osteoarthritis   . History of agent Orange exposure   . Hypertrophy of prostate without urinary obstruction and other lower urinary tract symptoms (LUTS)    No past surgical history on file.   History   Social History  . Marital Status: Married  Spouse Name: N/A  . Number of Children: 2   Occupational History  . Retired-[employer]    Social History Main Topics  . Smoking status: Never Smoker   . Smokeless tobacco: Never Used  . Alcohol Use: No  . Drug Use: No    [place] vet- 20% disability from shrapnel wounds 1967 and 20% agent orange   Married (Widowed 1996; remarried 6/03)   Retired [employer]      Has living will   Wife, then daughter Hoyle Barr will be health care POA   Would want attempts at resuscitation   Would not want feeding tube if cognitively unaware   Current Outpatient Prescriptions on File  Prior to Visit  Medication Sig Dispense Refill  . albuterol (VENTOLIN HFA) 108 (90 BASE) MCG/ACT inhaler Inhale 2 puffs into the lungs every 4 (four) hours as needed.      Marland Kitchen aspirin 81 MG tablet Take 81 mg by mouth daily.      . brimonidine (ALPHAGAN) 0.2 % ophthalmic solution Place 1 drop into both eyes 2 (two) times daily.     . cetirizine (ZYRTEC) 10 MG tablet Take 10 mg by mouth daily.    . fluticasone (FLONASE) 50 MCG/ACT nasal spray     . gabapentin (NEURONTIN) 300 MG capsule Take 1 capsule (300 mg total) by mouth 3 (three) times daily. 90 capsule 3  . glipiZIDE (GLUCOTROL XL) 5 MG 24 hr tablet Take 1 tablet (5 mg total) by mouth daily. 30 tablet 11  . JANUVIA 100 MG tablet TAKE 1 TABLET (100 MG TOTAL) BY MOUTH DAILY. 90 tablet 3  . ketoconazole (NIZORAL) 2 % cream     . latanoprost (XALATAN) 0.005 % ophthalmic solution Place 1 drop into both eyes at bedtime.     Marland Kitchen lisinopril (PRINIVIL,ZESTRIL) 20 MG tablet Take 10 mg by mouth daily.     . metFORMIN (GLUCOPHAGE) 1000 MG tablet Take 1,000 mg by mouth 2 (two) times daily with a meal.      . Misc Natural Products (GLUCOSAMINE CHONDROITIN ADV PO) Take 2 tablets by mouth 2 (two) times daily.    . Multiple Vitamin (MULTIVITAMIN) tablet Take 1 tablet by mouth daily.      . pravastatin (PRAVACHOL) 20 MG tablet Take 10 mg by mouth daily.     . sildenafil (REVATIO) 20 MG tablet Take 3-5 tablets (60-100 mg total) by mouth daily as needed. 50 tablet 11  . Vitamin D, Cholecalciferol, 1000 UNITS CAPS Take 1 capsule by mouth daily.     No current facility-administered medications on file prior to visit.   No Known Allergies Family History  Problem Relation Age of Onset  . Colon cancer Father   . Stroke Mother   . Heart attack Mother   . Hypertension      several siblings  . Diabetes      several siblings  . Coronary artery disease Mother   . Coronary artery disease Brother    PE: BP 114/62 mmHg  Pulse 79  Temp(Src) 98.4 F (36.9 C)  (Oral)  Resp 12  Wt 233 lb (105.688 kg)  SpO2 97% Body mass index is 31.59 kg/(m^2). Wt Readings from Last 3 Encounters:  05/07/15 233 lb (105.688 kg)  03/18/15 234 lb 6.4 oz (106.323 kg)  02/05/15 233 lb (105.688 kg)   Constitutional: overweight, in NAD Eyes: PERRLA, EOMI, no exophthalmos ENT: moist mucous membranes, no thyromegaly, no cervical lymphadenopathy Cardiovascular: RRR, No MRG Respiratory: CTA B Gastrointestinal: abdomen soft, NT, ND,  BS+ Musculoskeletal: no deformities, strength intact in all 4 Skin: moist, warm, rashes Neurological: no tremor with outstretched hands, DTR normal in all 4  ASSESSMENT: 1. DM2, non-insulin-dependent, uncontrolled, without complications  PLAN:  1. Patient with long-standing, uncontrolled diabetes, on oral antidiabetic regimen, now with worse control over the [place], but starting to improve this month. Last A1c this mo: 7.6% (higher). - I suggested to continue current regimen:  Patient Instructions  Please continue: - Metformin 1000 mg 2x a day, with meals - Glipizide XL 5 mg 1/2 tablet 2x a day, before meals - Januvia 100 mg in am, before breakfast  Please return in 3 months with your sugar log.   - continue checking sugars at different times of the day - check once a day, rotating checks - advised for yearly eye exams >> he is UTD - had the flu shot at [place] this season - Return to clinic in 3 mo with sugar log

## 2015-05-07 NOTE — Patient Instructions (Signed)
Please continue: - Metformin 1000 mg 2x a day, with meals - Glipizide XL 5 mg 1/2 tablet 2x a day, before meals - Januvia 100 mg in am, before breakfast  Please return in 3 months with your sugar log.

## 2015-06-28 ENCOUNTER — Ambulatory Visit (INDEPENDENT_AMBULATORY_CARE_PROVIDER_SITE_OTHER): Payer: Medicare Other | Admitting: Podiatry

## 2015-06-28 ENCOUNTER — Ambulatory Visit (INDEPENDENT_AMBULATORY_CARE_PROVIDER_SITE_OTHER): Payer: Medicare Other

## 2015-06-28 ENCOUNTER — Encounter: Payer: Self-pay | Admitting: Podiatry

## 2015-06-28 VITALS — BP 157/86 | HR 88 | Resp 16

## 2015-06-28 DIAGNOSIS — M10071 Idiopathic gout, right ankle and foot: Secondary | ICD-10-CM

## 2015-06-28 DIAGNOSIS — E119 Type 2 diabetes mellitus without complications: Secondary | ICD-10-CM

## 2015-06-28 DIAGNOSIS — M109 Gout, unspecified: Secondary | ICD-10-CM

## 2015-06-28 MED ORDER — INDOMETHACIN 50 MG PO CAPS: 50.0000 mg | ORAL_CAPSULE | Freq: Two times a day (BID) | ORAL | Status: AC

## 2015-06-28 NOTE — Progress Notes (Signed)
He presents today with 2 week duration of pain and swelling to his right first metatarsophalangeal joint. He states that he initially had some pain across the dorsal aspect of his left foot which was transient and then his right heel again transient before he developed swelling and pain the first metatarsophalangeal joint. He states that it seems to bother him most at nighttime and it is exquisitely painful if anything touches it. He denies changes in his past medical history medications allergies surgeries and social history. He states his blood sugars under good control.  Objective: vital signs are stable he is alert and oriented 3. Pulses are palpable. Neurologic sensorium is intact. No open lesions or wounds. Tight edematous skin overlying the first metatarsophalangeal joint of the right foot. Radiographs do demonstrate osteoarthritic changes of the midfoot bilaterally as well as swelling about the first metatarsophalangeal joint right foot. On palpation the joint is hot to touch edematous and erythematous. It is painful on range of motion.  Assessment: capsulitis gouty arthritis first metatarsophalangeal joint right foot.  Plan: I injected the area today proximal to the point of maximal tenderness. I started him on indomethacin 50 mg twice daily until this resolves I will follow-up with him  In 1 month. We also sent him for blood work consisting of a CBC and a arthritic panel .

## 2015-06-29 LAB — CBC WITH DIFFERENTIAL/PLATELET
Basophils Absolute: 0 10*3/uL (ref 0.0–0.2)
Basos: 0 %
EOS (ABSOLUTE): 0.2 10*3/uL (ref 0.0–0.4)
Eos: 4 %
Hematocrit: 40.5 % (ref 37.5–51.0)
Hemoglobin: 12.9 g/dL (ref 12.6–17.7)
Immature Grans (Abs): 0 10*3/uL (ref 0.0–0.1)
Immature Granulocytes: 0 %
Lymphocytes Absolute: 2 10*3/uL (ref 0.7–3.1)
Lymphs: 37 %
MCH: 27 pg (ref 26.6–33.0)
MCHC: 31.9 g/dL (ref 31.5–35.7)
MCV: 85 fL (ref 79–97)
Monocytes Absolute: 0.3 10*3/uL (ref 0.1–0.9)
Monocytes: 6 %
Neutrophils Absolute: 2.9 10*3/uL (ref 1.4–7.0)
Neutrophils: 53 %
Platelets: 253 10*3/uL (ref 150–379)
RBC: 4.78 x10E6/uL (ref 4.14–5.80)
RDW: 14.5 % (ref 12.3–15.4)
WBC: 5.4 10*3/uL (ref 3.4–10.8)

## 2015-06-29 LAB — C-REACTIVE PROTEIN: CRP: 3.8 mg/L (ref 0.0–4.9)

## 2015-06-29 LAB — ANA: Anti Nuclear Antibody(ANA): NEGATIVE

## 2015-06-29 LAB — SEDIMENTATION RATE: Sed Rate: 10 mm/hr (ref 0–30)

## 2015-06-29 LAB — URIC ACID: Uric Acid: 6.7 mg/dL (ref 3.7–8.6)

## 2015-06-29 LAB — RHEUMATOID FACTOR: Rhuematoid fact SerPl-aCnc: 16.9 IU/mL — ABNORMAL HIGH (ref 0.0–13.9)

## 2015-07-01 ENCOUNTER — Telehealth: Payer: Self-pay | Admitting: *Deleted

## 2015-07-01 DIAGNOSIS — M109 Gout, unspecified: Secondary | ICD-10-CM

## 2015-07-01 NOTE — Telephone Encounter (Addendum)
-----   Message from Garrel Ridgel, [place] sent at 06/29/2015  7:53 AM EDT ----- Elevated RA, needs to see a rheumatologist for a consult.  Probably nothing but should check it out anyway.  07/01/2015-Informed pt of Dr. Stephenie Acres orders and gave [facility] [phone]. Faxed referral, pt demographics and clinicals to [place].

## 2015-07-08 DIAGNOSIS — M1A9XX1 Chronic gout, unspecified, with tophus (tophi): Secondary | ICD-10-CM | POA: Insufficient documentation

## 2015-07-08 DIAGNOSIS — E119 Type 2 diabetes mellitus without complications: Secondary | ICD-10-CM | POA: Diagnosis not present

## 2015-07-08 DIAGNOSIS — N183 Chronic kidney disease, stage 3 unspecified: Secondary | ICD-10-CM | POA: Insufficient documentation

## 2015-07-08 DIAGNOSIS — I10 Essential (primary) hypertension: Secondary | ICD-10-CM | POA: Insufficient documentation

## 2015-07-08 DIAGNOSIS — R768 Other specified abnormal immunological findings in serum: Secondary | ICD-10-CM | POA: Diagnosis not present

## 2015-07-08 DIAGNOSIS — M1A00X0 Idiopathic chronic gout, unspecified site, without tophus (tophi): Secondary | ICD-10-CM | POA: Diagnosis not present

## 2015-07-28 ENCOUNTER — Ambulatory Visit (INDEPENDENT_AMBULATORY_CARE_PROVIDER_SITE_OTHER): Payer: Medicare Other | Admitting: Internal Medicine

## 2015-07-28 ENCOUNTER — Encounter: Payer: Self-pay | Admitting: Internal Medicine

## 2015-07-28 VITALS — BP 118/66 | HR 86 | Temp 97.6°F | Ht 71.0 in | Wt 229.0 lb

## 2015-07-28 DIAGNOSIS — E1140 Type 2 diabetes mellitus with diabetic neuropathy, unspecified: Secondary | ICD-10-CM | POA: Diagnosis not present

## 2015-07-28 DIAGNOSIS — I10 Essential (primary) hypertension: Secondary | ICD-10-CM | POA: Diagnosis not present

## 2015-07-28 DIAGNOSIS — E785 Hyperlipidemia, unspecified: Secondary | ICD-10-CM

## 2015-07-28 DIAGNOSIS — Z0000 Encounter for general adult medical examination without abnormal findings: Secondary | ICD-10-CM

## 2015-07-28 DIAGNOSIS — E1165 Type 2 diabetes mellitus with hyperglycemia: Secondary | ICD-10-CM

## 2015-07-28 DIAGNOSIS — Z7189 Other specified counseling: Secondary | ICD-10-CM

## 2015-07-28 DIAGNOSIS — N400 Enlarged prostate without lower urinary tract symptoms: Secondary | ICD-10-CM

## 2015-07-28 LAB — COMPREHENSIVE METABOLIC PANEL
ALT: 35 U/L (ref 0–53)
AST: 38 U/L — ABNORMAL HIGH (ref 0–37)
Albumin: 4.2 g/dL (ref 3.5–5.2)
Alkaline Phosphatase: 41 U/L (ref 39–117)
BUN: 20 mg/dL (ref 6–23)
CO2: 30 mEq/L (ref 19–32)
Calcium: 9.5 mg/dL (ref 8.4–10.5)
Chloride: 101 mEq/L (ref 96–112)
Creatinine, Ser: 1.06 mg/dL (ref 0.40–1.50)
GFR: 87.94 mL/min (ref >60.00)
Glucose, Bld: 149 mg/dL — ABNORMAL HIGH (ref 70–99)
Potassium: 4.5 mEq/L (ref 3.5–5.1)
Sodium: 138 mEq/L (ref 135–145)
Total Bilirubin: 0.5 mg/dL (ref 0.2–1.2)
Total Protein: 7.7 g/dL (ref 6.0–8.3)

## 2015-07-28 LAB — LIPID PANEL
Cholesterol: 169 mg/dL (ref 0–200)
HDL: 35.6 mg/dL — ABNORMAL LOW (ref >39.00)
LDL Cholesterol: 102 mg/dL — ABNORMAL HIGH (ref 0–99)
NonHDL: 133.54
Total CHOL/HDL Ratio: 5
Triglycerides: 160 mg/dL — ABNORMAL HIGH (ref 0.0–149.0)
VLDL: 32 mg/dL (ref 0.0–40.0)

## 2015-07-28 LAB — HM DIABETES FOOT EXAM

## 2015-07-28 LAB — HEMOGLOBIN A1C: Hgb A1c MFr Bld: 8.8 % — ABNORMAL HIGH (ref 4.6–6.5)

## 2015-07-28 NOTE — Progress Notes (Signed)
Subjective:    Patient ID: Lucas [name], male    DOB: [birth year], 75 y.o.   MRN: [id number]  HPI Here for Medicare wellness and follow up of chronic health conditions Reviewed form and advanced directives Reviewed other  Vision is okay. Cataract and mild glaucoma  Mild hearing loss--does have follow up with audiologist Does exercise regularly Has had some falls without injury No depression lately. No anhedonia for the most part Independent with instrumental ADLs No change in memory--feels he has stable issues with misplacing things  He is concerned about falling Has had 2 falls in recent months Relates it to weakness in legs Stumbled at mall--thinks he may not have raised legs enough No true numbness Recent evaluation with podiatrist--- gout issue Walks and goes to gym---hasn't done leg weights as much though Walks in mall regularly also  Continues with Dr Cruzita Lederer Diabetes control fair--though up some in past few weeks Eating off some lately Is in study group at [facility] for diabetes (mostly counseling)  Got his DNA evaluation with "[employer]" He is not sure about formal evaluation of his report though--got info about accessing more info about several conditions  No chest pain No SOB-- does have fatigue after walking 30 minutes. This is stable No dizziness or syncope No edema No headaches  Voids frequently Nocturia x 1-2 and stable Some urgency but not severe Uses the sildenafil---but hasn't been helpful  Current Outpatient Prescriptions on File Prior to Visit  Medication Sig Dispense Refill  . albuterol (VENTOLIN HFA) 108 (90 BASE) MCG/ACT inhaler Inhale 2 puffs into the lungs every 4 (four) hours as needed.      Marland Kitchen aspirin 81 MG tablet Take 81 mg by mouth daily.      . brimonidine (ALPHAGAN) 0.2 % ophthalmic solution Place 1 drop into both eyes 2 (two) times daily.     . cetirizine (ZYRTEC) 10 MG tablet Take 10 mg by mouth daily.    . fluticasone (FLONASE) 50 MCG/ACT  nasal spray     . gabapentin (NEURONTIN) 300 MG capsule Take 1 capsule (300 mg total) by mouth 3 (three) times daily. 90 capsule 3  . glipiZIDE (GLUCOTROL XL) 5 MG 24 hr tablet Take 1 tablet (5 mg total) by mouth daily. 30 tablet 11  . indomethacin (INDOCIN) 50 MG capsule Take 1 capsule (50 mg total) by mouth 2 (two) times daily with a meal. 60 capsule 1  . JANUVIA 100 MG tablet TAKE 1 TABLET (100 MG TOTAL) BY MOUTH DAILY. 90 tablet 3  . ketoconazole (NIZORAL) 2 % cream     . latanoprost (XALATAN) 0.005 % ophthalmic solution Place 1 drop into both eyes at bedtime.     Marland Kitchen lisinopril (PRINIVIL,ZESTRIL) 20 MG tablet Take 10 mg by mouth daily.     . metFORMIN (GLUCOPHAGE) 1000 MG tablet Take 1,000 mg by mouth 2 (two) times daily with a meal.      . Misc Natural Products (GLUCOSAMINE CHONDROITIN ADV PO) Take 2 tablets by mouth 2 (two) times daily.    . Multiple Vitamin (MULTIVITAMIN) tablet Take 1 tablet by mouth daily.      . pravastatin (PRAVACHOL) 20 MG tablet Take 10 mg by mouth daily.     . sildenafil (REVATIO) 20 MG tablet Take 3-5 tablets (60-100 mg total) by mouth daily as needed. 50 tablet 11  . Vitamin D, Cholecalciferol, 1000 UNITS CAPS Take 1 capsule by mouth daily.     No current facility-administered medications on  file prior to visit.    No Known Allergies  Past Medical History  Diagnosis Date  . Allergic rhinitis   . Diabetes mellitus type II   . Diverticulosis of colon   . HLD (hyperlipidemia)   . HTN (hypertension)   . Glaucoma   . Osteoarthritis   . History of agent Orange exposure   . Hypertrophy of prostate without urinary obstruction and other lower urinary tract symptoms (LUTS)     No past surgical history on file.  Family History  Problem Relation Age of Onset  . Colon cancer Father   . Stroke Mother   . Heart attack Mother   . Hypertension      several siblings  . Diabetes      several siblings  . Coronary artery disease Mother   . Coronary artery  disease Brother     Social History   Social History  . Marital Status: Married    Spouse Name: N/A  . Number of Children: N/A  . Years of Education: N/A   Occupational History  . Retired-[employer]    Social History Main Topics  . Smoking status: Never Smoker   . Smokeless tobacco: Never Used  . Alcohol Use: No  . Drug Use: No  . Sexual Activity: Not on file   Other Topics Concern  . Not on file   Social History Narrative   [place] vet- 20% disability from shrapnel wounds 1967 and 20% agent orange   Married (Widowed 1996; remarried 6/03)   Retired [employer] living will   Wife, then daughter Hoyle Barr will be health care POA   Would want attempts at resuscitation   Would not want feeding tube if cognitively unaware   Review of Systems Sleeps well with CPAP. Thinks it may be 12cm Wears seat belt Teeth are good--keeps up with dentist Appetite is not great Weight down a few pounds Bowels are regular. No blood in stool. No suspicious lesions. Still gets recurrent groin rash--- cream helps No sig back pain. Left > right knee pain chronically.  Uses the gabapentin for sciatica    Objective:   Physical Exam  Constitutional: He is oriented to person, place, and time. He appears well-developed and well-nourished. No distress.  HENT:  Mouth/Throat: Oropharynx is clear and moist. No oropharyngeal exudate.  Neck: Normal range of motion. Neck supple. No thyromegaly present.  Cardiovascular: Normal rate, regular rhythm, normal heart sounds and intact distal pulses.  Exam reveals no gallop.   No murmur heard. Pulmonary/Chest: Effort normal and breath sounds normal. No respiratory distress. He has no wheezes. He has no rales.  Abdominal: Soft. There is no tenderness.  Musculoskeletal: He exhibits no edema or tenderness.  Lymphadenopathy:    He has no cervical adenopathy.  Neurological: He is alert and oriented to person, place, and time.  President-- "Gigi Gin,  [name]" [id number] Recall 3/3  Abnormal sensation to monofilament in feet---thinks I was touching him when I wasn't  Skin:  No foot lesions          Assessment & Plan:

## 2015-07-28 NOTE — Assessment & Plan Note (Signed)
Mild symptoms No Rx for now 

## 2015-07-28 NOTE — Assessment & Plan Note (Signed)
Has abnormal sensation in feet Probably combination of diabetic and from sciatica Discussed using cane or walking stick for stability

## 2015-07-28 NOTE — Assessment & Plan Note (Signed)
BP Readings from Last 3 Encounters:  07/28/15 118/66  06/28/15 157/86  05/07/15 114/62   Good control now Due for labs

## 2015-07-28 NOTE — Assessment & Plan Note (Signed)
See social history 

## 2015-07-28 NOTE — Assessment & Plan Note (Signed)
No problems with statin 

## 2015-07-28 NOTE — Assessment & Plan Note (Signed)
I have personally reviewed the Medicare Annual Wellness questionnaire and have noted 1. The patient's medical and social history 2. Their use of alcohol, tobacco or illicit drugs 3. Their current medications and supplements 4. The patient's functional ability including ADL's, fall risks, home safety risks and hearing or visual             impairment. 5. Diet and physical activities 6. Evidence for depression or mood disorders  The patients weight, height, BMI and visual acuity have been recorded in the chart I have made referrals, counseling and provided education to the patient based review of the above and I have provided the pt with a written personalized care plan for preventive services.  I have provided you with a copy of your personalized plan for preventive services. Please take the time to review along with your updated medication list.  UTD on imms---yearly flu No PSA due to age Due for colon at [facility] next month Discussed fitness

## 2015-08-02 ENCOUNTER — Encounter: Payer: Self-pay | Admitting: Podiatry

## 2015-08-02 ENCOUNTER — Ambulatory Visit (INDEPENDENT_AMBULATORY_CARE_PROVIDER_SITE_OTHER): Payer: Medicare Other | Admitting: Podiatry

## 2015-08-02 VITALS — BP 124/61 | HR 104 | Resp 16

## 2015-08-02 DIAGNOSIS — M10071 Idiopathic gout, right ankle and foot: Secondary | ICD-10-CM | POA: Diagnosis not present

## 2015-08-02 DIAGNOSIS — M109 Gout, unspecified: Secondary | ICD-10-CM

## 2015-08-02 DIAGNOSIS — M722 Plantar fascial fibromatosis: Secondary | ICD-10-CM

## 2015-08-02 DIAGNOSIS — H401232 Low-tension glaucoma, bilateral, moderate stage: Secondary | ICD-10-CM | POA: Diagnosis not present

## 2015-08-02 DIAGNOSIS — E119 Type 2 diabetes mellitus without complications: Secondary | ICD-10-CM | POA: Diagnosis not present

## 2015-08-02 NOTE — Patient Instructions (Signed)

## 2015-08-02 NOTE — Progress Notes (Signed)
He presents today for follow-up of a gouty first metatarsophalangeal joint. He states that currently he is seeing rheumatology who will be taking care of his gout issues. He also states that he has heel pain right foot all the time.  Objective: Vital signs are stable he is alert and oriented 3. Pulses are palpable. Pain on palpation medial continue tubercle of the right heel.  Assessment: Fasciitis right.  Plan: Injected the right heel today with Kenalog and local anesthetic. Placed him in a plantar fascial brace and will follow up with him as needed.

## 2015-08-06 ENCOUNTER — Encounter: Payer: Self-pay | Admitting: Internal Medicine

## 2015-08-06 ENCOUNTER — Ambulatory Visit (INDEPENDENT_AMBULATORY_CARE_PROVIDER_SITE_OTHER): Payer: Medicare Other | Admitting: Internal Medicine

## 2015-08-06 VITALS — BP 118/64 | HR 110 | Temp 98.4°F | Resp 12 | Wt 228.0 lb

## 2015-08-06 DIAGNOSIS — E1165 Type 2 diabetes mellitus with hyperglycemia: Secondary | ICD-10-CM | POA: Diagnosis not present

## 2015-08-06 MED ORDER — GLIPIZIDE ER 5 MG PO TB24: ORAL_TABLET | ORAL | Status: DC

## 2015-08-06 NOTE — Progress Notes (Signed)
Patient ID: ILYAS AMOROSO, male   DOB: 1940/06/13, 75 y.o.   MRN: [id number]  HPI: JAXTON HUNSAKER is a 75 y.o.-year-old male, returning for f/u for DM2, dx in 1998, non-insulin-dependent (previous Agent Orange exposure in [place]), uncontrolled, without complications. Last visit 3 mo ago.  He was in a DM clinical trial - just finished it: "Jump Start" lifestyle Research Trial >> once a month >> counseling sessions - 1 year pgm.   Since last visit, he had 2 episodes of gout - new dx (1 steroid inj) and also plantar fasciitis >> could not exercise.  Last hemoglobin A1c was: Lab Results  Component Value Date   HGBA1C 8.8* 07/28/2015   HGBA1C 7.2 02/05/2015   HGBA1C 6.7 11/05/2014  04/15/2015: 7.6%  Pt is on a regimen of: - Metformin 1000 mg 2x a day, with meals - Glipizide XL 5 mg 1/2 tab 2x a day - Januvia 100 mg in am He had to stop [place] as K increased (took 1 mo, off for 1.5 mo). Invokana was not covered.  Pt checks his sugars 1-3x a day >> sugars higher: - am: 91, 108-130, 178x1 >> 120-150s, 190 this am >> 133-176 >> 145-211 - 2h after b'fast: n/c >> 196 >> 185, 209 - before lunch: n/c >> 120s >> n/c >> 65 (did not eat, worked outside), 136-188 - 2h after lunch: n/c >> 76-129 >> 99-186, 269 - before dinner: n/c >> 130s >> 77, 105-145, 165 >> 128, 155, 223 - 2h after dinner: n/c >> 130-140 >> n/c  - bedtime:  N/c >> 139-157, 208 >> 139-176 - nighttime: n/c No lows. Lowest sugar was 96 >> 65x1; he has hypoglycemia awareness at 70.  Highest sugar was 170 >> 190 >> 208 >> 269  Glucometer: Precision  Pt's meals are: - Breakfast:  Oatmeal, wheat English muffin  - Lunch: salad, 1/2 sandwich - Dinner: meat + veggies, no bread - Snacks: 2x a day, fruit bar, fresh fruit  He walks 30-45 min a day.   - + mild CKD, last BUN/creatinine:  Lab Results  Component Value Date   BUN 20 07/28/2015   CREATININE 1.06 07/28/2015  04/15/2015: GFR 88.1, Cr 1.06 On Lisinopril. - last  set of lipids: Lab Results  Component Value Date   CHOL 169 07/28/2015   HDL 35.60* 07/28/2015   LDLCALC 102* 07/28/2015   LDLDIRECT 75.7 07/02/2012   TRIG 160.0* 07/28/2015   CHOLHDL 5 07/28/2015  04/15/2015: LDL 89 On Pravastatin. - last dilated eye exam was in 05/03/2015. No DR. He has non-tension glaucoma. - no numbness and tingling in his feet.  He also has a history of hypertension, ED.  ROS: Constitutional: no weight gain, no fatigue, no subjective hyperthermia/hypothermia, no nocturia Eyes:no blurry vision, no xerophthalmia ENT: no sore throat, no nodules palpated in throat, no dysphagia/odynophagia, no hoarseness Cardiovascular: no CP/SOB/palpitations/leg swelling Respiratory: + cough/SOB Gastrointestinal: no N/V/D/C Musculoskeletal: no muscle/+ joint aches Skin: no rashes Neurological: no tremors/numbness/tingling/dizziness  I reviewed pt's medications, allergies, PMH, social hx, family hx, and changes were documented in the history of present illness. Otherwise, unchanged from my initial visit note.  Past Medical History  Diagnosis Date  . Allergic rhinitis   . Diabetes mellitus type II   . Diverticulosis of colon   . HLD (hyperlipidemia)   . HTN (hypertension)   . Glaucoma   . Osteoarthritis   . History of agent Orange exposure   . Hypertrophy of prostate without urinary obstruction and other  lower urinary tract symptoms (LUTS)    No past surgical history on file.   History   Social History  . Marital Status: Married    Spouse Name: N/A  . Number of Children: 2   Occupational History  . Retired-[employer]    Social History Main Topics  . Smoking status: Never Smoker   . Smokeless tobacco: Never Used  . Alcohol Use: No  . Drug Use: No    [place] vet- 20% disability from shrapnel wounds 1967 and 20% agent orange   Married (Widowed 1996; remarried 6/03)   Retired [employer]      Has living will   Wife, then daughter Hoyle Barr will be health care POA   Would  want attempts at resuscitation   Would not want feeding tube if cognitively unaware   Current Outpatient Prescriptions on File Prior to Visit  Medication Sig Dispense Refill  . albuterol (VENTOLIN HFA) 108 (90 BASE) MCG/ACT inhaler Inhale 2 puffs into the lungs every 4 (four) hours as needed.      Marland Kitchen aspirin 81 MG tablet Take 81 mg by mouth daily.      . brimonidine (ALPHAGAN) 0.2 % ophthalmic solution Place 1 drop into both eyes 2 (two) times daily.     . cetirizine (ZYRTEC) 10 MG tablet Take 10 mg by mouth daily.    . colchicine 0.6 MG tablet Take 0.6 mg by mouth as needed. Take 2 tablets by mouth at first sign of gout flare followed by 1 tablet after 1 hour. Next day 1 tab;et for 5 days    . fluticasone (FLONASE) 50 MCG/ACT nasal spray     . gabapentin (NEURONTIN) 300 MG capsule Take 1 capsule (300 mg total) by mouth 3 (three) times daily. 90 capsule 3  . glipiZIDE (GLUCOTROL XL) 5 MG 24 hr tablet Take 1 tablet (5 mg total) by mouth daily. 30 tablet 11  . indomethacin (INDOCIN) 50 MG capsule Take 1 capsule (50 mg total) by mouth 2 (two) times daily with a meal. 60 capsule 1  . JANUVIA 100 MG tablet TAKE 1 TABLET (100 MG TOTAL) BY MOUTH DAILY. 90 tablet 3  . ketoconazole (NIZORAL) 2 % cream     . latanoprost (XALATAN) 0.005 % ophthalmic solution Place 1 drop into both eyes at bedtime.     Marland Kitchen lisinopril (PRINIVIL,ZESTRIL) 20 MG tablet Take 10 mg by mouth daily.     . metFORMIN (GLUCOPHAGE) 1000 MG tablet Take 1,000 mg by mouth 2 (two) times daily with a meal.      . Misc Natural Products (GLUCOSAMINE CHONDROITIN ADV PO) Take 2 tablets by mouth 2 (two) times daily.    . Multiple Vitamin (MULTIVITAMIN) tablet Take 1 tablet by mouth daily.      . pravastatin (PRAVACHOL) 20 MG tablet Take 10 mg by mouth daily.     . sildenafil (REVATIO) 20 MG tablet Take 3-5 tablets (60-100 mg total) by mouth daily as needed. 50 tablet 11  . Vitamin D, Cholecalciferol, 1000 UNITS CAPS Take 1 capsule by mouth daily.      No current facility-administered medications on file prior to visit.   No Known Allergies Family History  Problem Relation Age of Onset  . Colon cancer Father   . Stroke Mother   . Heart attack Mother   . Hypertension      several siblings  . Diabetes      several siblings  . Coronary artery disease Mother   . Coronary artery disease  Brother    PE: BP 118/64 mmHg  Pulse 110  Temp(Src) 98.4 F (36.9 C) (Oral)  Resp 12  Wt 228 lb (103.42 kg)  SpO2 95% Body mass index is 31.81 kg/(m^2). Wt Readings from Last 3 Encounters:  08/06/15 228 lb (103.42 kg)  07/28/15 229 lb (103.874 kg)  05/07/15 233 lb (105.688 kg)   Constitutional: overweight, in NAD Eyes: PERRLA, EOMI, no exophthalmos ENT: moist mucous membranes, no thyromegaly, no cervical lymphadenopathy Cardiovascular: tachycardia, RR, No MRG Respiratory: CTA B Gastrointestinal: abdomen soft, NT, ND, BS+ Musculoskeletal: no deformities, strength intact in all 4 Skin: moist, warm, rashes Neurological: no tremor with outstretched hands, DTR normal in all 4  ASSESSMENT: 1. DM2, non-insulin-dependent, uncontrolled, without complications  PLAN:  1. Patient with long-standing, uncontrolled diabetes, on oral antidiabetic regimen, now with worse control especially 2/2 lack of exercise and dietary indiscretions in last months, and likely also steroid inj more recently. Last A1c this mo: 8.8% (higher). - will increase Glipizide but strongly advised him to watch his diet  - I suggested to:  Patient Instructions  Please continue: - Metformin 1000 mg 2x a day, with meals - Januvia 100 mg in am, before breakfast  Please increase: - Glipizide XL 5 mg in am and 2.5 mg before dinner  Please return in 3 months with your sugar log.   - continue checking sugars at different times of the day - check once a day, rotating checks - advised for yearly eye exams >> he is UTD - had the flu shot at [place] this season - Return to clinic in  3 mo with sugar log

## 2015-08-06 NOTE — Patient Instructions (Signed)
Please continue: - Metformin 1000 mg 2x a day, with meals - Januvia 100 mg in am, before breakfast  Please increase: - Glipizide XL 5 mg in am and 2.5 mg before dinner  Please return in 3 months with your sugar log.

## 2015-08-13 ENCOUNTER — Telehealth: Payer: Self-pay | Admitting: Internal Medicine

## 2015-08-13 MED ORDER — GLIPIZIDE ER 5 MG PO TB24: ORAL_TABLET | ORAL | Status: DC

## 2015-08-13 NOTE — Telephone Encounter (Signed)
Pharmacy notified.

## 2015-08-13 NOTE — Telephone Encounter (Signed)
Please ask the pharmacist to refill the 1 tab in am and 1/2 in pm, as prescribed. We are using the dose before dinner for its immediate release effect.

## 2015-08-13 NOTE — Telephone Encounter (Signed)
[facility] called to verify the Glipizide Rx that was sent in.  CB # [phone]

## 2015-08-13 NOTE — Telephone Encounter (Addendum)
Pharmacy called to verify the instructions on the glipizide. The instructions state 1 tab in the morning and 1/2 in the evening. Pharmacist stated the XR is not normally taken in half dosages.  Please advise if the rx is correct.  Thanks!

## 2015-08-13 NOTE — Telephone Encounter (Signed)
Rx submitted

## 2015-08-13 NOTE — Telephone Encounter (Signed)
Patient need refill of glipiZIDE (GLUCOTROL XL) 5 MG 24 hr tablet only 20 tablet, he is tolally out. Send to  [id number] - [place], [place] [phone] (Phone) [phone] (Fax)       Highest b/s 175 Lowest b/s 87

## 2015-08-17 ENCOUNTER — Other Ambulatory Visit: Payer: Self-pay | Admitting: Internal Medicine

## 2015-08-28 ENCOUNTER — Other Ambulatory Visit: Payer: Self-pay | Admitting: Internal Medicine

## 2015-10-19 LAB — HM COLONOSCOPY

## 2015-11-04 ENCOUNTER — Encounter: Payer: Self-pay | Admitting: Internal Medicine

## 2015-11-08 DIAGNOSIS — E119 Type 2 diabetes mellitus without complications: Secondary | ICD-10-CM | POA: Diagnosis not present

## 2015-11-08 DIAGNOSIS — H401232 Low-tension glaucoma, bilateral, moderate stage: Secondary | ICD-10-CM | POA: Diagnosis not present

## 2015-11-10 DIAGNOSIS — M1A00X0 Idiopathic chronic gout, unspecified site, without tophus (tophi): Secondary | ICD-10-CM | POA: Diagnosis not present

## 2015-11-10 DIAGNOSIS — I10 Essential (primary) hypertension: Secondary | ICD-10-CM | POA: Diagnosis not present

## 2015-11-10 DIAGNOSIS — E119 Type 2 diabetes mellitus without complications: Secondary | ICD-10-CM | POA: Diagnosis not present

## 2015-11-10 DIAGNOSIS — N183 Chronic kidney disease, stage 3 (moderate): Secondary | ICD-10-CM | POA: Diagnosis not present

## 2015-11-10 DIAGNOSIS — R768 Other specified abnormal immunological findings in serum: Secondary | ICD-10-CM | POA: Diagnosis not present

## 2015-11-26 ENCOUNTER — Ambulatory Visit: Payer: Medicare Other | Admitting: Internal Medicine

## 2015-12-17 ENCOUNTER — Encounter: Payer: Self-pay | Admitting: Internal Medicine

## 2015-12-17 ENCOUNTER — Ambulatory Visit (INDEPENDENT_AMBULATORY_CARE_PROVIDER_SITE_OTHER): Payer: Medicare Other | Admitting: Internal Medicine

## 2015-12-17 VITALS — BP 124/74 | HR 96 | Ht 71.0 in | Wt 237.0 lb

## 2015-12-17 DIAGNOSIS — E1165 Type 2 diabetes mellitus with hyperglycemia: Secondary | ICD-10-CM

## 2015-12-17 LAB — POCT GLYCOSYLATED HEMOGLOBIN (HGB A1C): Hemoglobin A1C: 7.7

## 2015-12-17 NOTE — Addendum Note (Signed)
Addended by: Caprice Beaver T on: 12/17/2015 04:09 PM   Modules accepted: Orders

## 2015-12-17 NOTE — Patient Instructions (Addendum)
Please continue: - Metformin 1000 mg 2x a day, with meals - Januvia 100 mg in am, before breakfast - Glipizide XL 5 mg in am and 2.5 mg before dinner  Please return in 1.5 months with your sugar log.

## 2015-12-17 NOTE — Progress Notes (Signed)
Patient ID: Lucas [name], male   DOB: [birth year], 75 y.o.   MRN: [id number]  HPI: Lucas [name] is a 75 y.o.-year-old male, returning for f/u for DM2, dx in 1998, non-insulin-dependent (previous Agent Orange exposure in [place]), uncontrolled, without complications. Last visit 3 mo ago.  He was in a DM clinical trial - just finished it: "Jump Start" lifestyle Research Trial >> once a month >> counseling sessions - 1 year pgm.   He gained 9 lbs since last visit.  He started water exercises in 11/2015 - 45 min 3x a week. He was walking, but has knee pain now. He still does Pilates and weights.  Last hemoglobin A1c was: Lab Results  Component Value Date   HGBA1C 8.8 (H) 07/28/2015   HGBA1C 7.2 02/05/2015   HGBA1C 6.7 11/05/2014  04/15/2015: 7.6%  Pt is on a regimen of: - Metformin 1000 mg 2x a day, with meals - Glipizide XL 5 mg in am and 2.5 mg before dinner  - Januvia 100 mg in am He had to stop [place] as K increased (took 1 mo, off for 1.5 mo). Invokana was not covered.  Pt checks his sugars 1-3x a day >> sugars: - am: 91, 108-130, 178x1 >> 120-150s, 190 this am >> 133-176 >> 145-211 >> 132-216 - 2h after b'fast: n/c >> 196 >> 185, 209 >> 197 - before lunch: n/c >> 120s >> n/c >> 65 (did not eat, worked outside), 136-188 >> 111, 120, 239-269 - 2h after lunch: n/c >> 76-129 >> 99-186, 269 >> 152 - before dinner: n/c >> 130s >> 77, 105-145, 165 >> 128, 155, 223 >> 148-178, 220 - 2h after dinner: n/c >> 130-140 >> n/c  - bedtime:  N/c >> 139-157, 208 >> 139-176 >> n/c - nighttime: n/c No lows. Lowest sugar was 96 >> 65x1 >> 111; he has hypoglycemia awareness at 70.  Highest sugar was 170 >> 190 >> 208 >> 269.  Glucometer: Precision  Pt's meals are: - Breakfast:  Oatmeal, wheat English muffin  - Lunch: salad, 1/2 sandwich - Dinner: meat + veggies, no bread - Snacks: 2x a day, fruit bar, fresh fruit  He walks 30-45 min a day.   - + mild CKD, last BUN/creatinine:  Lab  Results  Component Value Date   BUN 20 07/28/2015   CREATININE 1.06 07/28/2015  04/15/2015: GFR 88.1, Cr 1.06 On Lisinopril. - last set of lipids: Lab Results  Component Value Date   CHOL 169 07/28/2015   HDL 35.60 (L) 07/28/2015   LDLCALC 102 (H) 07/28/2015   LDLDIRECT 75.7 07/02/2012   TRIG 160.0 (H) 07/28/2015   CHOLHDL 5 07/28/2015  04/15/2015: LDL 89 On Pravastatin. - last dilated eye exam was in 05/03/2015. No DR. He has non-tension glaucoma. - no numbness and tingling in his feet.  He also has a history of hypertension, ED. He has gout.  ROS: Constitutional: +  weight gain, no fatigue, no subjective hyperthermia/hypothermia, no nocturia Eyes:no blurry vision, no xerophthalmia ENT: no sore throat, no nodules palpated in throat, no dysphagia/odynophagia, no hoarseness Cardiovascular: no CP/SOB/palpitations/leg swelling Respiratory: no cough/SOB Gastrointestinal: no N/V/D/C Musculoskeletal: no muscle/+ joint aches Skin: no rashes Neurological: no tremors/numbness/tingling/dizziness  I reviewed pt's medications, allergies, PMH, social hx, family hx, and changes were documented in the history of present illness. Otherwise, unchanged from my initial visit note.  Past Medical History:  Diagnosis Date  . Allergic rhinitis   . Diabetes mellitus type II   . Diverticulosis of  colon   . Glaucoma   . History of agent Orange exposure   . HLD (hyperlipidemia)   . HTN (hypertension)   . Hypertrophy of prostate without urinary obstruction and other lower urinary tract symptoms (LUTS)   . Osteoarthritis    No past surgical history on file.   History   Social History  . Marital Status: Married    Spouse Name: N/A  . Number of Children: 2   Occupational History  . Retired-[employer]    Social History Main Topics  . Smoking status: Never Smoker   . Smokeless tobacco: Never Used  . Alcohol Use: No  . Drug Use: No    [place] vet- 20% disability from shrapnel wounds 1967 and  20% agent orange   Married (Widowed 1996; remarried 6/03)   Retired [employer]      Has living will   Wife, then daughter Hoyle Barr will be health care POA   Would want attempts at resuscitation   Would not want feeding tube if cognitively unaware   Current Outpatient Prescriptions on File Prior to Visit  Medication Sig Dispense Refill  . albuterol (VENTOLIN HFA) 108 (90 BASE) MCG/ACT inhaler Inhale 2 puffs into the lungs every 4 (four) hours as needed.      Marland Kitchen aspirin 81 MG tablet Take 81 mg by mouth daily.      . brimonidine (ALPHAGAN) 0.2 % ophthalmic solution Place 1 drop into both eyes 2 (two) times daily.     . cetirizine (ZYRTEC) 10 MG tablet Take 10 mg by mouth daily.    . colchicine 0.6 MG tablet Take 0.6 mg by mouth as needed. Take 2 tablets by mouth at first sign of gout flare followed by 1 tablet after 1 hour. Next day 1 tab;et for 5 days    . fluticasone (FLONASE) 50 MCG/ACT nasal spray     . gabapentin (NEURONTIN) 300 MG capsule Take 1 capsule (300 mg total) by mouth 3 (three) times daily. 90 capsule 3  . glipiZIDE (GLUCOTROL XL) 5 MG 24 hr tablet Take 1 tablet in am and 1/2 tablet before diner 135 tablet 1  . indomethacin (INDOCIN) 50 MG capsule Take 1 capsule (50 mg total) by mouth 2 (two) times daily with a meal. 60 capsule 1  . JANUVIA 100 MG tablet TAKE 1 TABLET (100 MG TOTAL) BY MOUTH DAILY. 90 tablet 3  . ketoconazole (NIZORAL) 2 % cream     . latanoprost (XALATAN) 0.005 % ophthalmic solution Place 1 drop into both eyes at bedtime.     Marland Kitchen lisinopril (PRINIVIL,ZESTRIL) 20 MG tablet Take 10 mg by mouth daily.     . metFORMIN (GLUCOPHAGE) 1000 MG tablet Take 1,000 mg by mouth 2 (two) times daily with a meal.      . Misc Natural Products (GLUCOSAMINE CHONDROITIN ADV PO) Take 2 tablets by mouth 2 (two) times daily.    . Multiple Vitamin (MULTIVITAMIN) tablet Take 1 tablet by mouth daily.      . pravastatin (PRAVACHOL) 20 MG tablet Take 10 mg by mouth daily.     . sildenafil  (REVATIO) 20 MG tablet TAKE 3-5 TABLETS BY MOUTH DAILY AS NEEDED. 50 tablet 11  . Vitamin D, Cholecalciferol, 1000 UNITS CAPS Take 1 capsule by mouth daily.     No current facility-administered medications on file prior to visit.    No Known Allergies Family History  Problem Relation Age of Onset  . Colon cancer Father   . Stroke Mother   .  Heart attack Mother   . Hypertension      several siblings  . Diabetes      several siblings  . Coronary artery disease Mother   . Coronary artery disease Brother    PE: BP 124/74 (BP Location: Left Arm, Patient Position: Sitting)   Pulse 96   Ht 5\' 11"  (1.803 m)   Wt 237 lb (107.5 kg)   SpO2 96%   BMI 33.05 kg/m  Body mass index is 33.05 kg/m. Wt Readings from Last 3 Encounters:  12/17/15 237 lb (107.5 kg)  08/06/15 228 lb (103.4 kg)  07/28/15 229 lb (103.9 kg)   Constitutional: overweight, in NAD Eyes: PERRLA, EOMI, no exophthalmos ENT: moist mucous membranes, no thyromegaly, no cervical lymphadenopathy Cardiovascular: tachycardia, RR, No MRG Respiratory: CTA B Gastrointestinal: abdomen soft, NT, ND, BS+ Musculoskeletal: no deformities, strength intact in all 4 Skin: moist, warm, rashes Neurological: no tremor with outstretched hands, DTR normal in all 4  ASSESSMENT: 1. DM2, non-insulin-dependent, uncontrolled, with hyperglycemia  PLAN:  1. Patient with long-standing, uncontrolled diabetes, on oral antidiabetic regimen, now with slightly better control after increasing Glipizide at last visit. Sugars are still high, though. He just started water exercises >> plans to increase the water exercises. He will also restart walking. We also discussed about improving diet. He also needs to lose weight (gained 9 lbs since last visit). Will continue the same regimen for now and check back in 1.5 mo after he implements these changes. - I suggested to:  Patient Instructions  Please continue: - Metformin 1000 mg 2x a day, with meals -  Januvia 100 mg in am, before breakfast - Glipizide XL 5 mg in am and 2.5 mg before dinner  Please return in 3 months with your sugar log.   - continue checking sugars at different times of the day - check once a day, rotating checks - advised for yearly eye exams >> he is UTD - checked HbA1c >> 7.7% (better) - Return to clinic in 3 mo with sugar log   Philemon Kingdom, MD PhD [facility] Endocrinology

## 2016-01-20 ENCOUNTER — Ambulatory Visit (INDEPENDENT_AMBULATORY_CARE_PROVIDER_SITE_OTHER): Payer: Medicare Other | Admitting: Family Medicine

## 2016-01-20 ENCOUNTER — Encounter: Payer: Self-pay | Admitting: Family Medicine

## 2016-01-20 VITALS — BP 96/54 | HR 101 | Temp 98.7°F | Ht 71.0 in | Wt 237.2 lb

## 2016-01-20 DIAGNOSIS — M545 Low back pain, unspecified: Secondary | ICD-10-CM

## 2016-01-20 DIAGNOSIS — M130 Polyarthritis, unspecified: Secondary | ICD-10-CM | POA: Diagnosis not present

## 2016-01-20 DIAGNOSIS — Z23 Encounter for immunization: Secondary | ICD-10-CM | POA: Diagnosis not present

## 2016-01-20 DIAGNOSIS — M79605 Pain in left leg: Principal | ICD-10-CM

## 2016-01-20 MED ORDER — GABAPENTIN 300 MG PO CAPS: 600.0000 mg | ORAL_CAPSULE | Freq: Three times a day (TID) | ORAL | 3 refills | Status: DC

## 2016-01-20 NOTE — Progress Notes (Signed)
Dr. Frederico Hamman T. [name], MD, [facility] [place], [address] Phone: [phone] Fax: [phone]  01/20/2016  Patient: Lucas [name], MRN: [id number], DOB: 06-14-1940, 75 y.o.  Primary Physician:  Viviana Simpler, MD  Chief Complaint: Pain down left leg and Stiffness in both ankle   Subjective:   Lucas [name] is a 75 y.o. very pleasant male patient who presents with the following:  Saw this patient several years ago, and he is here in follow-up  To discuss a few issues  Including long-term low back pain with sciatica symptoms on the left.  He also has  Multiple joints involved that are achy without swelling or redness at the age of 67.   He does have a diagnosis of gout.  He has seen rheumatology within the last 2 months.  Upon review of the medical record, the patient has a normal sedimentation rate, CRP, ANA, CCP antibodies,  And he has a mildly elevated rheumatoid factor. The patient wonders if he has any kind of systemic rheumatological condition.  I reiterated to him the rheumatology notes.  On my review of the record, and based on his history, he has no signs or symptoms of systemic disease with the exception of gout.  Lumbar radiculopathy L Pain coming and going down his left leg. Took some alleve and that irritated his bowels.  Has gouty arthropathy.  This is all going thing for years, and now is having pain down the left side.  He is having a difficult time tolerating NSAIDs.  03/19/2014 Last OV with Owens Loffler, MD  I met this gentleman over the summer, and at that point he was having some sciatica type symptoms, and I felt it was most likely secondary to piriformis syndrome.  He did okay for a while, but over the last few weeks he has had persistently worsening pain in his back, buttocks, and he is also had some pain radiating down the posterior aspect of his LEFT leg.  Is also had some numbness in his LEFT  foot.  A couple of weeks ago when he was walking he fell significantly.  He does have numbness on that side.  He also does have some pain in his LEFT knee that is relatively dull and aching on the lateral aspect.  He has not had any kind of dramatic trauma that he knows of in his LEFT knee.  No prior significant known histories in the knee.  He is not having an effusion.  His knee is not locking up.  Numbness going down into the lateral aspect of foot. He has fallen a couple of times. Always seems like the left side.   Fell two weeks ago. Walked about five hundred feet.   Also some knee pain on the left, too.   Lateral l le dull  Past Medical History, Surgical History, Social History, Family History, Problem List, Medications, and Allergies have been reviewed and updated if relevant.  GEN: no acute illness or fever CV: No chest pain or shortness of breath MSK: detailed above Neuro: neurological signs are described above ROS O/w per HPI  Objective:   BP (!) 96/54   Pulse (!) 101   Temp 98.7 F (37.1 C) (Oral)   Ht 5' 11" (1.803 m)   Wt 237 lb 4 oz (107.6 kg)   BMI 33.09 kg/m    GEN: Well-developed,well-nourished,in no acute distress; alert,appropriate and cooperative throughout examination HEENT: Normocephalic and  atraumatic without obvious abnormalities. Ears, externally no deformities PULM: Breathing comfortably in no respiratory distress EXT: No clubbing, cyanosis, or edema PSYCH: Normally interactive. Cooperative during the interview. Pleasant. Friendly and conversant. Not anxious or depressed appearing. Normal, full affect.  Range of motion at  the waist: Flexion, extension, lateral bending and rotation: mildly limited  No echymosis or edema Rises to examination table with mild difficulty Gait: minimally antalgic  Inspection/Deformity: N Paraspinus Tenderness: mild L4-S1  B Ankle Dorsiflexion (L5,4): 5/5 B Great Toe Dorsiflexion (L5,4): 5/5 Heel Walk (L5):  WNL Toe Walk (S1): WNL Rise/Squat (L4): WNL, mild pain  SENSORY B Medial Foot (L4): WNL B Dorsum (L5): WNL B Lateral (S1): decreased Light Touch: lateral leg L Pinprick: lateral leg L  REFLEXES Knee (L4): 2+ Ankle (S1): 2+  B SLR, seated: neg B SLR, supine: neg B FABER: neg B Reverse FABER: neg B Greater Troch: NT B Log Roll: neg B Stork: NT B Sciatic Notch: NT   Radiology: CLINICAL DATA:  Low back pain. Left foot numbness after a fall 2-3 months ago.  EXAM: LUMBAR SPINE - COMPLETE 4+ VIEW  COMPARISON:  None.  FINDINGS: Degenerative disc disease and spondylosis with loss of disc height especially at L4-5 but also at L3-4 and L5-S1, Schmorl's nodes along multiple sets of endplates most notably F00-7, and 3 mm degenerative posterior subluxation at the L4-5 level. Degenerative facet arthropathy noted at L4-5 and L5-S1. Vacuum disc phenomenon in the lower lumbar spine.  No fracture or acute findings observed.  IMPRESSION: 1. The is a moderate degree of lower lumbar spondylosis and degenerative disc disease. I do not observe a fracture.   Electronically Signed   By: Sherryl Barters M.D.   On: 03/19/2014 16:26  Assessment and Plan:   Lumbar pain with radiation down left leg  Need for prophylactic vaccination and inoculation against influenza - Plan: Flu Vaccine QUAD 36+ mos IM  Polyarthropathy, multiple sites  Her reassure the patient that he does not seem to have any rheumatological disease.  He does have osteoarthritis in multiple joints.   He also has gouty arthropathy.  Low back pain, chronic with some radicular symptoms down the left leg.  Trial some neuropathic pain medication would be reasonable in this case.  Very slow titration reviewed at length.  Follow-up: 2 mo  Modified Medications   Modified Medication Previous Medication   GABAPENTIN (NEURONTIN) 300 MG CAPSULE gabapentin (NEURONTIN) 300 MG capsule      Take 2 capsules (600 mg  total) by mouth 3 (three) times daily.    Take 1 capsule (300 mg total) by mouth 3 (three) times daily.   Orders Placed This Encounter  Procedures  . Flu Vaccine QUAD 36+ mos IM    Signed,  Makila Colombe T. Dilon Lank, MD   Patient's Medications  New Prescriptions   No medications on file  Previous Medications   ALBUTEROL (VENTOLIN HFA) 108 (90 BASE) MCG/ACT INHALER    Inhale 2 puffs into the lungs every 4 (four) hours as needed.     ASPIRIN 81 MG TABLET    Take 81 mg by mouth daily.     BRIMONIDINE (ALPHAGAN) 0.2 % OPHTHALMIC SOLUTION    Place 1 drop into both eyes 2 (two) times daily.    CETIRIZINE (ZYRTEC) 10 MG TABLET    Take 10 mg by mouth daily.   COLCHICINE 0.6 MG TABLET    Take 0.6 mg by mouth as needed. Take 2 tablets by mouth at first sign  of gout flare followed by 1 tablet after 1 hour. Next day 1 tab;et for 5 days   FLUTICASONE (FLONASE) 50 MCG/ACT NASAL SPRAY       GLIPIZIDE (GLUCOTROL XL) 5 MG 24 HR TABLET    Take 1 tablet in am and 1/2 tablet before diner   INDOMETHACIN (INDOCIN) 50 MG CAPSULE    Take 1 capsule (50 mg total) by mouth 2 (two) times daily with a meal.   JANUVIA 100 MG TABLET    TAKE 1 TABLET (100 MG TOTAL) BY MOUTH DAILY.   KETOCONAZOLE (NIZORAL) 2 % CREAM       LATANOPROST (XALATAN) 0.005 % OPHTHALMIC SOLUTION    Place 1 drop into both eyes at bedtime.    LISINOPRIL (PRINIVIL,ZESTRIL) 20 MG TABLET    Take 10 mg by mouth daily.    METFORMIN (GLUCOPHAGE) 1000 MG TABLET    Take 1,000 mg by mouth 2 (two) times daily with a meal.     MISC NATURAL PRODUCTS (GLUCOSAMINE CHONDROITIN ADV PO)    Take 2 tablets by mouth 2 (two) times daily.   MULTIPLE VITAMIN (MULTIVITAMIN) TABLET    Take 1 tablet by mouth daily.     PRAVASTATIN (PRAVACHOL) 20 MG TABLET    Take 10 mg by mouth daily.    SILDENAFIL (REVATIO) 20 MG TABLET    TAKE 3-5 TABLETS BY MOUTH DAILY AS NEEDED.   VITAMIN D, CHOLECALCIFEROL, 1000 UNITS CAPS    Take 1 capsule by mouth daily.  Modified Medications    Modified Medication Previous Medication   GABAPENTIN (NEURONTIN) 300 MG CAPSULE gabapentin (NEURONTIN) 300 MG capsule      Take 2 capsules (600 mg total) by mouth 3 (three) times daily.    Take 1 capsule (300 mg total) by mouth 3 (three) times daily.  Discontinued Medications   No medications on file

## 2016-01-20 NOTE — Patient Instructions (Signed)
Generic Gabapentin Titration Schedule  Generic Gabapentin (generic form of Neurontin) comes in 300 mg tablets or capsules.   You have to titrate your dose slowly to reduce side effects and reduce sedation / sleepiness.    Week               Breakfast  Lunch   Dinner  Three   300mg    300mg    300mg  Four   300mg    300mg    600mg  (2 tabs) Five   600mg  (2 tabs) 300mg    600mg  (2 tabs) Six   600mg  (2 tabs) 600mg  (2 tabs) 600mg  (2 tabs)  If you have any problems at any time, drop back to the previous dosing schedule. Continue with this dose for 1 week, and then try to go up the next step again.

## 2016-01-20 NOTE — Progress Notes (Signed)
Pre visit review using our clinic review tool, if applicable. No additional management support is needed unless otherwise documented below in the visit note. 

## 2016-02-11 ENCOUNTER — Ambulatory Visit (INDEPENDENT_AMBULATORY_CARE_PROVIDER_SITE_OTHER): Payer: Medicare Other | Admitting: Internal Medicine

## 2016-02-11 ENCOUNTER — Encounter: Payer: Self-pay | Admitting: Internal Medicine

## 2016-02-11 VITALS — BP 120/80 | HR 100 | Wt 233.0 lb

## 2016-02-11 DIAGNOSIS — E1165 Type 2 diabetes mellitus with hyperglycemia: Secondary | ICD-10-CM

## 2016-02-11 MED ORDER — GLIPIZIDE ER 5 MG PO TB24: ORAL_TABLET | ORAL | 3 refills | Status: DC

## 2016-02-11 MED ORDER — DULAGLUTIDE 0.75 MG/0.5ML ~~LOC~~ SOAJ: SUBCUTANEOUS | 1 refills | Status: DC

## 2016-02-11 NOTE — Patient Instructions (Addendum)
Please continue: - Metformin 1000 mg 2x a day, with meals  Please increase: - Glipizide XL 5 mg in am and 5 mg XL tablet (crushed) or 5 mg regular tablet before dinner  Add: - Trulicity [id number] mg weekly.  Stop the Januvia 3 days after starting Trulicity.  Send me a message in ~ 3 weeks to see if I need to send a prescription for the higher dose of Trulicity.  Please return in 1.5 months with your sugar log.

## 2016-02-11 NOTE — Progress Notes (Signed)
Patient ID: Lucas [name], male   DOB: Jan 21, 1941, 75 y.o.   MRN: [id number]  HPI: [name] is a 75 y.o.-year-old male, returning for f/u for DM2, dx in 1998, non-insulin-dependent (previous Agent Orange exposure in [place]), uncontrolled, without complications. Last visit 3 mo ago.  He was in a DM clinical trial before last visit: "Jump Start" lifestyle Research Trial >> once a month >> counseling sessions - 1 year pgm.   He restarted water exercises in 11/2015 - 45 min 3x a week. Not this week, as he has a URI. He will start walking as his PN is better - PCP increased the neuropathy.  Last hemoglobin A1c was: Lab Results  Component Value Date   HGBA1C 7.7 12/17/2015   HGBA1C 8.8 (H) 07/28/2015   HGBA1C 7.2 02/05/2015  04/15/2015: 7.6%  Pt is on a regimen of: - Metformin 1000 mg 2x a day, with meals - Glipizide XL 5 mg in am and 2.5 mg before dinner  - Januvia 100 mg in am He had to stop [place] as K increased (took 1 mo, off for 1.5 mo). Invokana was not covered.  Pt checks his sugars 1-3x a day >> sugars: - am: 91, 108-130, 178x1 >> 120-150s, 190 this am >> 133-176 >> 145-211 >> 132-216 >> 133, 165-217, 241 - 2h after b'fast: n/c >> 196 >> 185, 209 >> 197 >> n/c - before lunch:120s >> n/c >> 65 (did not eat, worked outside), 136-188 >> 111, 120, 239-269 >> 114, 140-190, 250 - 2h after lunch: n/c >> 76-129 >> 99-186, 269 >> 152 >> n/c - before dinner: n/c >> 130s >> 77, 105-145, 165 >> 128, 155, 223 >> 148-178, 220 >> 133-178, 281 - 2h after dinner: n/c >> 130-140 >> n/c  - bedtime:  N/c >> 139-157, 208 >> 139-176 >> n/c >> 102, 115-237 - nighttime: n/c No lows. Lowest sugar was 65x1 >> 111 >> 114; he has hypoglycemia awareness at 70.  Highest sugar was 269 >> 281.  Glucometer: Precision  Pt's meals are: - Breakfast:  Oatmeal, wheat English muffin  - Lunch: salad, 1/2 sandwich - Dinner: meat + veggies, no bread - Snacks: 2x a day, fruit bar, fresh fruit  He walks  30-45 min a day.   - + mild CKD, last BUN/creatinine:  11/10/2015: EGFR 80, creatinine 1.1 Lab Results  Component Value Date   BUN 20 07/28/2015   CREATININE 1.06 07/28/2015  04/15/2015: GFR 88.1, Cr 1.06 On Lisinopril. - last set of lipids: Lab Results  Component Value Date   CHOL 169 07/28/2015   HDL 35.60 (L) 07/28/2015   LDLCALC 102 (H) 07/28/2015   LDLDIRECT 75.7 07/02/2012   TRIG 160.0 (H) 07/28/2015   CHOLHDL 5 07/28/2015  04/15/2015: LDL 89 On Pravastatin. - last dilated eye exam was in 05/03/2015. No DR. He has non-tension glaucoma. - no numbness and tingling in his feet.  He also has a history of hypertension, ED. He has gout.  ROS: Constitutional: no weight gain, no fatigue, no subjective hyperthermia/hypothermia, + nocturia Eyes:no blurry vision, no xerophthalmia ENT: no sore throat, no nodules palpated in throat, no dysphagia/odynophagia, no hoarseness Cardiovascular: no CP/SOB/palpitations/leg swelling Respiratory: no cough/SOB Gastrointestinal: no N/V/D/C Musculoskeletal: no muscle/+ joint aches Skin: no rashes Neurological: no tremors/numbness/tingling/dizziness  I reviewed pt's medications, allergies, PMH, social hx, family hx, and changes were documented in the history of present illness. Otherwise, unchanged from my initial visit note.  Past Medical History:  Diagnosis Date  .  Allergic rhinitis   . Diabetes mellitus type II   . Diverticulosis of colon   . Glaucoma   . History of agent Orange exposure   . HLD (hyperlipidemia)   . HTN (hypertension)   . Hypertrophy of prostate without urinary obstruction and other lower urinary tract symptoms (LUTS)   . Osteoarthritis    No past surgical history on file.   History   Social History  . Marital Status: Married    Spouse Name: N/A  . Number of Children: 2   Occupational History  . Retired-[employer]    Social History Main Topics  . Smoking status: Never Smoker   . Smokeless tobacco: Never Used   . Alcohol Use: No  . Drug Use: No    [place] vet- 20% disability from shrapnel wounds 1967 and 20% agent orange   Married (Widowed 1996; remarried 6/03)   Retired [employer]      Has living will   Wife, then daughter Hoyle Barr will be health care POA   Would want attempts at resuscitation   Would not want feeding tube if cognitively unaware   Current Outpatient Prescriptions on File Prior to Visit  Medication Sig Dispense Refill  . albuterol (VENTOLIN HFA) 108 (90 BASE) MCG/ACT inhaler Inhale 2 puffs into the lungs every 4 (four) hours as needed.      Marland Kitchen aspirin 81 MG tablet Take 81 mg by mouth daily.      . brimonidine (ALPHAGAN) 0.2 % ophthalmic solution Place 1 drop into both eyes 2 (two) times daily.     . cetirizine (ZYRTEC) 10 MG tablet Take 10 mg by mouth daily.    . colchicine 0.6 MG tablet Take 0.6 mg by mouth as needed. Take 2 tablets by mouth at first sign of gout flare followed by 1 tablet after 1 hour. Next day 1 tab;et for 5 days    . fluticasone (FLONASE) 50 MCG/ACT nasal spray     . gabapentin (NEURONTIN) 300 MG capsule Take 2 capsules (600 mg total) by mouth 3 (three) times daily. 180 capsule 3  . glipiZIDE (GLUCOTROL XL) 5 MG 24 hr tablet Take 1 tablet in am and 1/2 tablet before diner 135 tablet 1  . indomethacin (INDOCIN) 50 MG capsule Take 1 capsule (50 mg total) by mouth 2 (two) times daily with a meal. 60 capsule 1  . JANUVIA 100 MG tablet TAKE 1 TABLET (100 MG TOTAL) BY MOUTH DAILY. 90 tablet 3  . ketoconazole (NIZORAL) 2 % cream     . latanoprost (XALATAN) 0.005 % ophthalmic solution Place 1 drop into both eyes at bedtime.     Marland Kitchen lisinopril (PRINIVIL,ZESTRIL) 20 MG tablet Take 10 mg by mouth daily.     . metFORMIN (GLUCOPHAGE) 1000 MG tablet Take 1,000 mg by mouth 2 (two) times daily with a meal.      . Misc Natural Products (GLUCOSAMINE CHONDROITIN ADV PO) Take 2 tablets by mouth 2 (two) times daily.    . Multiple Vitamin (MULTIVITAMIN) tablet Take 1 tablet by mouth  daily.      . pravastatin (PRAVACHOL) 20 MG tablet Take 10 mg by mouth daily.     . sildenafil (REVATIO) 20 MG tablet TAKE 3-5 TABLETS BY MOUTH DAILY AS NEEDED. 50 tablet 11  . Vitamin D, Cholecalciferol, 1000 UNITS CAPS Take 1 capsule by mouth daily.     No current facility-administered medications on file prior to visit.    No Known Allergies Family History  Problem Relation Age  of Onset  . Colon cancer Father   . Stroke Mother   . Heart attack Mother   . Hypertension      several siblings  . Diabetes      several siblings  . Coronary artery disease Mother   . Coronary artery disease Brother    PE: BP 120/80 (BP Location: Left Arm, Patient Position: Sitting)   Pulse 100   Wt 233 lb (105.7 kg)   SpO2 96%   BMI 32.50 kg/m  Body mass index is 32.5 kg/m. Wt Readings from Last 3 Encounters:  02/11/16 233 lb (105.7 kg)  01/20/16 237 lb 4 oz (107.6 kg)  12/17/15 237 lb (107.5 kg)   Constitutional: overweight, in NAD Eyes: PERRLA, EOMI, no exophthalmos ENT: moist mucous membranes, no thyromegaly, no cervical lymphadenopathy Cardiovascular: tachycardia, RR, No MRG Respiratory: CTA B Gastrointestinal: abdomen soft, NT, ND, BS+ Musculoskeletal: no deformities, strength intact in all 4 Skin: moist, warm, rashes Neurological: no tremor with outstretched hands, DTR normal in all 4  ASSESSMENT: 1. DM2, non-insulin-dependent, uncontrolled, with hyperglycemia  PLAN:  1. Patient with long-standing, uncontrolled diabetes, on oral antidiabetic regimen, with high sugars at last visit, after he stopped the lifestyle clinical trial. Just before last visit, he started water exercises which he continues (not this week). He will also start walking more. Sugars higher, though >> I will increase his evening Glipizide and switch from Januvia to Trulicity. Demonstrated use. - Last HbA1c 7.7% (still high) - I suggested to:  Patient Instructions  Please continue: - Metformin 1000 mg 2x a day,  with meals  Please increase: - Glipizide XL 5 mg in am and 5 mg XL tablet (crushed) or 5 mg regular tablet before dinner  Add: - Trulicity 0.93 mg weekly.  Stop the Januvia 3 days after starting Trulicity.  Send me a message in ~ 3 weeks to see if I need to send a prescription for the higher dose of Trulicity.  Please return in 1.5 months with your sugar log.   - continue checking sugars at different times of the day - check once a day, rotating checks - advised for yearly eye exams >> he is UTD - He is up-to-date with his flu shot - Return to clinic in 1.5 mo with sugar log   Philemon Kingdom, MD PhD [facility] Endocrinology

## 2016-03-23 ENCOUNTER — Ambulatory Visit: Payer: Medicare Other | Admitting: Internal Medicine

## 2016-03-30 ENCOUNTER — Telehealth: Payer: Self-pay

## 2016-03-30 NOTE — Telephone Encounter (Signed)
Pt left note; going out of town and requesting refill gabapentin to [facility]. [facility] already has available refills for gabapentin and will get ready for pick up. Unable to reach pt by phone; left v/m per DPR on home phone to ck with [facility] for refill.

## 2016-04-12 ENCOUNTER — Other Ambulatory Visit: Payer: Self-pay | Admitting: Internal Medicine

## 2016-05-02 ENCOUNTER — Other Ambulatory Visit: Payer: Self-pay

## 2016-05-02 MED ORDER — DULAGLUTIDE 0.75 MG/0.5ML ~~LOC~~ SOAJ: SUBCUTANEOUS | 0 refills | Status: DC

## 2016-05-08 ENCOUNTER — Encounter: Payer: Self-pay | Admitting: Internal Medicine

## 2016-05-08 ENCOUNTER — Ambulatory Visit (INDEPENDENT_AMBULATORY_CARE_PROVIDER_SITE_OTHER): Payer: Medicare Other | Admitting: Internal Medicine

## 2016-05-08 VITALS — BP 132/88 | HR 87 | Ht 71.0 in | Wt 235.0 lb

## 2016-05-08 DIAGNOSIS — E1165 Type 2 diabetes mellitus with hyperglycemia: Secondary | ICD-10-CM

## 2016-05-08 LAB — POCT GLYCOSYLATED HEMOGLOBIN (HGB A1C): Hemoglobin A1C: 7.7

## 2016-05-08 MED ORDER — DULAGLUTIDE 1.5 MG/0.5ML ~~LOC~~ SOAJ: SUBCUTANEOUS | 3 refills | Status: DC

## 2016-05-08 NOTE — Patient Instructions (Signed)
Please continue: - Metformin 1000 mg 2x a day, with meals - Glipizide XL 5 mg in am and 5 mg XL tablet (crushed) or 5 mg regular tablet before dinner  Please increase: - Trulicity to 1.5 mg weekly.  Please return in 1.5 months with your sugar log.

## 2016-05-08 NOTE — Progress Notes (Signed)
Patient ID: Lucas [name], male   DOB: 1940/08/16, 76 y.o.   MRN: [id number]  HPI: Lucas [name] is a 76 y.o.-year-old male, returning for f/u for DM2, dx in 1998, non-insulin-dependent (previous Agent Orange exposure in [place]), uncontrolled, without complications. Last visit 3 mo ago.   He was traveling for Christmas for 2 weeks.  He restarted water exercises when he returned - 45 min 4x a week. He feels better.  Last hemoglobin A1c was: Lab Results  Component Value Date   HGBA1C 7.7 12/17/2015   HGBA1C 8.8 (H) 07/28/2015   HGBA1C 7.2 02/05/2015  04/15/2015: 7.6%  Pt is on a regimen of: - Metformin 1000 mg 2x a day, with meals - Glipizide XL 5 mg in am and 5 mg XL tablet (crushed) or 5 mg regular tablet before dinner - Trulicity 8.92 mg weekly - started 02/2016 >>  He had to stop [place] as K increased (took 1 mo, off for 1.5 mo). Invokana was not covered.  Pt checks his sugars 1-3x a day >> sugars better in last 2 weeks: - am:133-176 >> 145-211 >> 132-216 >> 133, 165-217, 241 >> 102-182, 243 >> 106-202 - 2h after b'fast: n/c >> 196 >> 185, 209 >> 197 >> n/c >> 150-189 >> 184 - before lunch: 111, 120, 239-269 >> 114, 140-190, 250 >> 186 >> n/c - 2h after lunch: n/c >> 76-129 >> 99-186, 269 >> 152 >> n/c  >> 94-228 - before dinner:128, 155, 223 >> 148-178, 220 >> 133-178, 281 >> 125-148, 196 >> 89-147, 172 - 2h after dinner: n/c >> 130-140 >> n/c >> 89-193, 267 >> n/c - bedtime:  N/c >> 139-157, 208 >> 139-176 >> n/c >> 102, 115-237 >> 143-276 >> 122-155, 228 - nighttime: n/c >> 165 No lows. Lowest sugar was 65x1 >> 111 >> 114 >> 89; he has hypoglycemia awareness at 70.  Highest sugar was 269 >> 281 >> 228.  Glucometer: Precision  Pt's meals are: - Breakfast:  Oatmeal, wheat English muffin  - Lunch: salad, 1/2 sandwich - Dinner: meat + veggies, no bread - Snacks: 2x a day, fruit bar, fresh fruit  - + mild CKD, last BUN/creatinine:  11/10/2015: EGFR 80, creatinine  1.1 Lab Results  Component Value Date   BUN 20 07/28/2015   CREATININE 1.06 07/28/2015  04/15/2015: GFR 88.1, Cr 1.06 On Lisinopril. - last set of lipids: Lab Results  Component Value Date   CHOL 169 07/28/2015   HDL 35.60 (L) 07/28/2015   LDLCALC 102 (H) 07/28/2015   LDLDIRECT 75.7 07/02/2012   TRIG 160.0 (H) 07/28/2015   CHOLHDL 5 07/28/2015  04/15/2015: LDL 89 On Pravastatin. - last dilated eye exam was in 05/03/2015. No DR. He has non-tension glaucoma. - no numbness and tingling in his feet.  He was in a DM clinical trial before: "Jump Start" lifestyle Research Trial >> once a month >> counseling sessions - 1 year pgm.  He also has a history of hypertension, ED. He has gout.  ROS: Constitutional: no weight gain, no fatigue, no subjective hyperthermia/hypothermia, + nocturia Eyes:no blurry vision, no xerophthalmia ENT: no sore throat, no nodules palpated in throat, no dysphagia/odynophagia, no hoarseness Cardiovascular: no CP/SOB/palpitations/leg swelling Respiratory: no cough/SOB Gastrointestinal: no N/V/D/+ C Musculoskeletal: no muscle/joint aches Skin: no rashes Neurological: no tremors/numbness/tingling/dizziness  I reviewed pt's medications, allergies, PMH, social hx, family hx, and changes were documented in the history of present illness. Otherwise, unchanged from my initial visit note.  Past Medical History:  Diagnosis Date  . Allergic rhinitis   . Diabetes mellitus type II   . Diverticulosis of colon   . Glaucoma   . History of agent Orange exposure   . HLD (hyperlipidemia)   . HTN (hypertension)   . Hypertrophy of prostate without urinary obstruction and other lower urinary tract symptoms (LUTS)   . Osteoarthritis    No past surgical history on file.   History   Social History  . Marital Status: Married    Spouse Name: N/A  . Number of Children: 2   Occupational History  . Retired-[employer]    Social History Main Topics  . Smoking status: Never  Smoker   . Smokeless tobacco: Never Used  . Alcohol Use: No  . Drug Use: No    [place] vet- 20% disability from shrapnel wounds 1967 and 20% agent orange   Married (Widowed 1996; remarried 6/03)   Retired [employer]      Has living will   Wife, then daughter Lucas [name] will be health care POA   Would want attempts at resuscitation   Would not want feeding tube if cognitively unaware   Current Outpatient Prescriptions on File Prior to Visit  Medication Sig Dispense Refill  . albuterol (VENTOLIN HFA) 108 (90 BASE) MCG/ACT inhaler Inhale 2 puffs into the lungs every 4 (four) hours as needed.      Marland Kitchen aspirin 81 MG tablet Take 81 mg by mouth daily.      . brimonidine (ALPHAGAN) 0.2 % ophthalmic solution Place 1 drop into both eyes 2 (two) times daily.     . cetirizine (ZYRTEC) 10 MG tablet Take 10 mg by mouth daily.    . colchicine 0.6 MG tablet Take 0.6 mg by mouth as needed. Take 2 tablets by mouth at first sign of gout flare followed by 1 tablet after 1 hour. Next day 1 tab;et for 5 days    . Dulaglutide (TRULICITY) 6.21 HY/8.6VH SOPN INJECT 0.75 MG UNDER SKIN ONCE AWEEK 12 pen 0  . fluticasone (FLONASE) 50 MCG/ACT nasal spray     . gabapentin (NEURONTIN) 300 MG capsule Take 2 capsules (600 mg total) by mouth 3 (three) times daily. 180 capsule 3  . glipiZIDE (GLUCOTROL XL) 5 MG 24 hr tablet Take 1 tablet in am and 1 crushed tablet before diner 180 tablet 3  . indomethacin (INDOCIN) 50 MG capsule Take 1 capsule (50 mg total) by mouth 2 (two) times daily with a meal. 60 capsule 1  . ketoconazole (NIZORAL) 2 % cream     . latanoprost (XALATAN) 0.005 % ophthalmic solution Place 1 drop into both eyes at bedtime.     Marland Kitchen lisinopril (PRINIVIL,ZESTRIL) 20 MG tablet Take 10 mg by mouth daily.     . metFORMIN (GLUCOPHAGE) 1000 MG tablet Take 1,000 mg by mouth 2 (two) times daily with a meal.      . Misc Natural Products (GLUCOSAMINE CHONDROITIN ADV PO) Take 2 tablets by mouth 2 (two) times daily.    .  Multiple Vitamin (MULTIVITAMIN) tablet Take 1 tablet by mouth daily.      . pravastatin (PRAVACHOL) 20 MG tablet Take 10 mg by mouth daily.     . sildenafil (REVATIO) 20 MG tablet TAKE 3-5 TABLETS BY MOUTH DAILY AS NEEDED. 50 tablet 11  . Vitamin D, Cholecalciferol, 1000 UNITS CAPS Take 1 capsule by mouth daily.     No current facility-administered medications on file prior to visit.    No Known Allergies Family History  Problem Relation Age of Onset  . Colon cancer Father   . Stroke Mother   . Heart attack Mother   . Hypertension      several siblings  . Diabetes      several siblings  . Coronary artery disease Mother   . Coronary artery disease Brother    PE: There were no vitals taken for this visit. There is no height or weight on file to calculate BMI. Wt Readings from Last 3 Encounters:  02/11/16 233 lb (105.7 kg)  01/20/16 237 lb 4 oz (107.6 kg)  12/17/15 237 lb (107.5 kg)   Constitutional: overweight, in NAD Eyes: PERRLA, EOMI, no exophthalmos ENT: moist mucous membranes, no thyromegaly, no cervical lymphadenopathy Cardiovascular: tachycardia, RR, No MRG Respiratory: CTA B Gastrointestinal: abdomen soft, NT, ND, BS+ Musculoskeletal: no deformities, strength intact in all 4 Skin: moist, warm, rashes Neurological: no tremor with outstretched hands, DTR normal in all 4  ASSESSMENT: 1. DM2, non-insulin-dependent, uncontrolled, with hyperglycemia  PLAN:  1. Patient with long-standing, uncontrolled diabetes, on oral antidiabetic regimen, with higher sugars after he stopped the lifestyle clinical trial. We increased Glipizide at last visit and switched from [place] to Trulicity. His sugars are still high on the low dose Trulicity but started back on water aerobics and changing his diet after the Holidays. Will also increase Trulicity dose.Will increase dietary fiber to help with constipation. - Last HbA1c 7.7% (still high) >> today: 7.7% (stable) - I suggested to:   Patient Instructions  Please continue: - Metformin 1000 mg 2x a day, with meals - Glipizide XL 5 mg in am and 5 mg XL tablet (crushed) or 5 mg regular tablet before dinner  Please increase: - Trulicity to 1.5 mg weekly.  Please return in 3 months with your sugar log.   - continue checking sugars at different times of the day - check once a day, rotating checks - advised for yearly eye exams >> needs a new one - He is up-to-date with his flu shot - Return to clinic in 3 mo with sugar log   Philemon Kingdom, MD PhD [facility] Endocrinology

## 2016-05-08 NOTE — Addendum Note (Signed)
Addended by: Caprice Beaver T on: 05/08/2016 01:47 PM   Modules accepted: Orders

## 2016-05-16 DIAGNOSIS — M5136 Other intervertebral disc degeneration, lumbar region: Secondary | ICD-10-CM | POA: Insufficient documentation

## 2016-05-16 DIAGNOSIS — N183 Chronic kidney disease, stage 3 (moderate): Secondary | ICD-10-CM | POA: Diagnosis not present

## 2016-05-16 DIAGNOSIS — E119 Type 2 diabetes mellitus without complications: Secondary | ICD-10-CM | POA: Diagnosis not present

## 2016-05-16 DIAGNOSIS — I10 Essential (primary) hypertension: Secondary | ICD-10-CM | POA: Diagnosis not present

## 2016-05-16 DIAGNOSIS — R768 Other specified abnormal immunological findings in serum: Secondary | ICD-10-CM | POA: Diagnosis not present

## 2016-05-16 DIAGNOSIS — M1A00X0 Idiopathic chronic gout, unspecified site, without tophus (tophi): Secondary | ICD-10-CM | POA: Diagnosis not present

## 2016-06-02 ENCOUNTER — Telehealth: Payer: Self-pay

## 2016-06-02 NOTE — Telephone Encounter (Signed)
[facility]; [facility] 100 mg tid  Then increased to 200 mg tid. But Dr Lorelei Pont had advised pt to take 300 mg  Taking 2 caps tid. I could not understand what pt was asking for and I asked pt to have [place] fax [phone] with what information is needed. Then pt said he thinks [facility] last gave [facility] gabapentin 300 mg to take 2 caps tid. I advised that would have been what Dr Lorelei Pont wanted pt to have. Pt said no that still was not right. So pt will have [place] fax request of what info [facility] needs. Pt said he is feeling good.

## 2016-07-11 LAB — HM DIABETES EYE EXAM

## 2016-07-13 LAB — LIPID PANEL
Cholesterol: 179 mg/dL (ref 0–200)
HDL: 44 mg/dL (ref 35–70)
LDL Cholesterol: 104 mg/dL

## 2016-07-14 DIAGNOSIS — H401232 Low-tension glaucoma, bilateral, moderate stage: Secondary | ICD-10-CM | POA: Diagnosis not present

## 2016-07-17 DIAGNOSIS — N183 Chronic kidney disease, stage 3 (moderate): Secondary | ICD-10-CM | POA: Diagnosis not present

## 2016-07-17 DIAGNOSIS — M1A00X0 Idiopathic chronic gout, unspecified site, without tophus (tophi): Secondary | ICD-10-CM | POA: Diagnosis not present

## 2016-07-17 DIAGNOSIS — R768 Other specified abnormal immunological findings in serum: Secondary | ICD-10-CM | POA: Diagnosis not present

## 2016-07-17 DIAGNOSIS — M109 Gout, unspecified: Secondary | ICD-10-CM | POA: Insufficient documentation

## 2016-07-17 DIAGNOSIS — M10071 Idiopathic gout, right ankle and foot: Secondary | ICD-10-CM | POA: Diagnosis not present

## 2016-07-17 LAB — BASIC METABOLIC PANEL: Creatinine: 1.1 mg/dL (ref <=1.3)

## 2016-07-27 DIAGNOSIS — H401232 Low-tension glaucoma, bilateral, moderate stage: Secondary | ICD-10-CM | POA: Diagnosis not present

## 2016-07-27 DIAGNOSIS — E119 Type 2 diabetes mellitus without complications: Secondary | ICD-10-CM | POA: Diagnosis not present

## 2016-07-31 ENCOUNTER — Ambulatory Visit: Payer: Medicare Other

## 2016-08-02 ENCOUNTER — Encounter: Payer: Medicare Other | Admitting: Internal Medicine

## 2016-08-08 ENCOUNTER — Ambulatory Visit (INDEPENDENT_AMBULATORY_CARE_PROVIDER_SITE_OTHER): Payer: Medicare Other | Admitting: Internal Medicine

## 2016-08-08 ENCOUNTER — Encounter: Payer: Self-pay | Admitting: Internal Medicine

## 2016-08-08 VITALS — BP 118/62 | HR 82 | Ht 71.0 in | Wt 229.0 lb

## 2016-08-08 DIAGNOSIS — E1165 Type 2 diabetes mellitus with hyperglycemia: Secondary | ICD-10-CM | POA: Diagnosis not present

## 2016-08-08 NOTE — Patient Instructions (Addendum)
Please continue: - Metformin 1000 mg 2x a day, with meals - Glipizide XL 5 mg in am and 5 mg XL tablet (crushed) - Trulicity 1.5 mg weekly.  Try to eliminate meat with dinner.  If sugars not better after this >> move all Metformin with dinner.  Please return in 3 months with your sugar log.

## 2016-08-08 NOTE — Progress Notes (Signed)
Patient ID: Lucas [name], male   DOB: [birth year], 76 y.o.   MRN: [id number]  HPI: Lucas [name] is a 76 y.o.-year-old male, returning for f/u for DM2, dx in 1998, non-insulin-dependent (previous Agent Orange exposure in [place]), uncontrolled, without complications. Last visit 3 mo ago.  Since last visit, he lost 7 lbs.   Last hemoglobin A1c was: 07/13/2016: HbA1c 8.0% Lab Results  Component Value Date   HGBA1C 7.7 05/08/2016   HGBA1C 7.7 12/17/2015   HGBA1C 8.8 (H) 07/28/2015  04/15/2015: 7.6%  Pt is on a regimen of: - Metformin 1000 mg 2x a day, with meals - Glipizide XL 5 mg in am and 5 mg XL tablet (crushed) before dinner - Trulicity 6.59 mg weekly - started 02/2016 >> 1.5 mg weekly  - had constipation >> now better. He had to stop [place] as K increased (took 1 mo, off for 1.5 mo). Invokana was not covered.  Pt checks his sugars 1-3x a day:  - am: 133, 165-217, 241 >> 102-182, 243 >> 106-202 >> 127-180, 196, 200 - 2h after b'fast: n/c >> 196 >> 185, 209 >> 197 >> n/c >> 150-189 >> 184 >> 189 - before lunch: 111, 120, 239-269 >> 114, 140-190, 250 >> 186 >> n/c >> 94, 111, 178 - 2h after lunch: n/c >> 76-129 >> 99-186, 269 >> 152 >> n/c  >> 94-228 >> 116, 140 - before dinner:148-178, 220 >> 133-178, 281 >> 125-148, 196 >> 89-147, 172 >> 104-138, 233 - 2h after dinner: n/c >> 130-140 >> n/c >> 89-193, 267 >> n/c >> 139 - bedtime:  N/c >> 139-157, 208 >> 139-176 >> n/c >> 102, 115-237 >> 143-276 >> 122-155, 228 >> 82, 93 - nighttime: n/c >> 165 No lows. Lowest sugar was 65x1 >> 111 >> 114 >> 89 >> 82; he has hypoglycemia awareness at 70.  Highest sugar was 269 >> 281 >> 228.  Glucometer: Precision >> One Touch Ultra mini  Pt's meals are: - Breakfast:  Oatmeal, wheat English muffin  - Lunch: salad, 1/2 sandwich - Dinner: meat + veggies, no bread - Snacks: 2x a day, fruit bar, fresh fruit  He continues to do: water exercises when he returned - 45 min 4x a week.  - + mild  CKD, last BUN/creatinine:  07/13/2016: BUN/Cr 13/1.0, Glu 116, UACR 0.5 11/10/2015: EGFR 80, creatinine 1.1 Lab Results  Component Value Date   BUN 20 07/28/2015   CREATININE 1.06 07/28/2015  04/15/2015: GFR 88.1, Cr 1.06 On Lisinopril. - last set of lipids: 07/13/2016: 179/288/44/77 Lab Results  Component Value Date   CHOL 169 07/28/2015   HDL 35.60 (L) 07/28/2015   LDLCALC 102 (H) 07/28/2015   LDLDIRECT 75.7 07/02/2012   TRIG 160.0 (H) 07/28/2015   CHOLHDL 5 07/28/2015  04/15/2015: LDL 89 On Pravastatin. - last dilated eye exam was in 07/11/2016. No DR.Marland Kitchen He has non-tension glaucoma. - denies numbness and tingling in his feet.  He was in a DM clinical trial before: "Jump Start" lifestyle Research Trial >> once a month >> counseling sessions - 1 year pgm.  He also has a history of hypertension, ED. He has gout >> increased Allopurinol dse to 300 mg per day.  ROS: Constitutional: no weight gain/no weight loss, no fatigue, no subjective hyperthermia, no subjective hypothermia Eyes: no blurry vision, no xerophthalmia ENT: no sore throat, no nodules palpated in throat, no dysphagia, no odynophagia, no hoarseness Cardiovascular: no CP/no SOB/no palpitations/no leg swelling Respiratory: no cough/no SOB/no wheezing  Gastrointestinal: no N/no V/no D/+ C (better)/no acid reflux Musculoskeletal: no muscle aches/+ joint aches (L knee) Skin: no rashes, no hair loss Neurological: no tremors/no numbness/no tingling/no dizziness  I reviewed pt's medications, allergies, PMH, social hx, family hx, and changes were documented in the history of present illness. Otherwise, unchanged from my initial visit note.  Past Medical History:  Diagnosis Date  . Allergic rhinitis   . Diabetes mellitus type II   . Diverticulosis of colon   . Glaucoma   . History of agent Orange exposure   . HLD (hyperlipidemia)   . HTN (hypertension)   . Hypertrophy of prostate without urinary obstruction and other  lower urinary tract symptoms (LUTS)   . Osteoarthritis    No past surgical history on file.   History   Social History  . Marital Status: Married    Spouse Name: N/A  . Number of Children: 2   Occupational History  . Retired-[employer]    Social History Main Topics  . Smoking status: Never Smoker   . Smokeless tobacco: Never Used  . Alcohol Use: No  . Drug Use: No    [place] vet- 20% disability from shrapnel wounds 1967 and 20% agent orange   Married (Widowed 1996; remarried 6/03)   Retired [employer]      Has living will   Wife, then daughter Hoyle Barr will be health care POA   Would want attempts at resuscitation   Would not want feeding tube if cognitively unaware   Current Outpatient Prescriptions on File Prior to Visit  Medication Sig Dispense Refill  . albuterol (VENTOLIN HFA) 108 (90 BASE) MCG/ACT inhaler Inhale 2 puffs into the lungs every 4 (four) hours as needed.      Marland Kitchen aspirin 81 MG tablet Take 81 mg by mouth daily.      . brimonidine (ALPHAGAN) 0.2 % ophthalmic solution Place 1 drop into both eyes 2 (two) times daily.     . cetirizine (ZYRTEC) 10 MG tablet Take 10 mg by mouth daily.    . colchicine 0.6 MG tablet Take 0.6 mg by mouth as needed. Take 2 tablets by mouth at first sign of gout flare followed by 1 tablet after 1 hour. Next day 1 tab;et for 5 days    . Dulaglutide (TRULICITY) 1.5 WN/4.6EV SOPN Inject 1.5 mg under skin on Sundays 12 pen 3  . fluticasone (FLONASE) 50 MCG/ACT nasal spray     . gabapentin (NEURONTIN) 300 MG capsule Take 2 capsules (600 mg total) by mouth 3 (three) times daily. 180 capsule 3  . glipiZIDE (GLUCOTROL XL) 5 MG 24 hr tablet Take 1 tablet in am and 1 crushed tablet before diner 180 tablet 3  . indomethacin (INDOCIN) 50 MG capsule Take 1 capsule (50 mg total) by mouth 2 (two) times daily with a meal. 60 capsule 1  . ketoconazole (NIZORAL) 2 % cream     . latanoprost (XALATAN) 0.005 % ophthalmic solution Place 1 drop into both eyes at bedtime.      Marland Kitchen lisinopril (PRINIVIL,ZESTRIL) 20 MG tablet Take 10 mg by mouth daily.     . metFORMIN (GLUCOPHAGE) 1000 MG tablet Take 1,000 mg by mouth 2 (two) times daily with a meal.      . Misc Natural Products (GLUCOSAMINE CHONDROITIN ADV PO) Take 2 tablets by mouth 2 (two) times daily.    . Multiple Vitamin (MULTIVITAMIN) tablet Take 1 tablet by mouth daily.      . pravastatin (PRAVACHOL) 20 MG tablet  Take 10 mg by mouth daily.     . sildenafil (REVATIO) 20 MG tablet TAKE 3-5 TABLETS BY MOUTH DAILY AS NEEDED. 50 tablet 11  . Vitamin D, Cholecalciferol, 1000 UNITS CAPS Take 1 capsule by mouth daily.     No current facility-administered medications on file prior to visit.    No Known Allergies Family History  Problem Relation Age of Onset  . Colon cancer Father   . Stroke Mother   . Heart attack Mother   . Hypertension      several siblings  . Diabetes      several siblings  . Coronary artery disease Mother   . Coronary artery disease Brother    PE: BP 118/62 (BP Location: Left Arm, Patient Position: Sitting)   Pulse 82   Ht _0  (1.803 m)   Wt 229 lb (103.9 kg)   SpO2 97%   BMI 31.94 kg/m  Body mass index is 31.94 kg/m. Wt Readings from Last 3 Encounters:  08/08/16 229 lb (103.9 kg)  05/08/16 235 lb (106.6 kg)  02/11/16 233 lb (105.7 kg)   Constitutional: overweight, in NAD Eyes: PERRLA, EOMI, no exophthalmos ENT: moist mucous membranes, no thyromegaly, no cervical lymphadenopathy Cardiovascular: RRR, No MRG Respiratory: CTA B Gastrointestinal: abdomen soft, NT, ND, BS+ Musculoskeletal: no deformities, strength intact in all 4 Skin: moist, warm, no rashes Neurological: no tremor with outstretched hands, DTR normal in all 4  ASSESSMENT: 1. DM2, non-insulin-dependent, uncontrolled, with hyperglycemia  PLAN:  1. Patient with long-standing, uncontrolled diabetes, on oral antidiabetic regimen, with improving sugars as the day goes by but they are higher in am. I advised him  to reduce meat with dinner and if sugars not better in am >> move all Metformin at night.  - I suggested to:  Patient Instructions  Please continue: - Metformin 1000 mg 2x a day, with meals - Glipizide XL 5 mg in am and 5 mg XL tablet (crushed) - Trulicity 1.5 mg weekly.  Try to eliminate meat with dinner.  If sugars not better after this >> move all Metformin with dinner.  Please return in 3 months with your sugar log.   - continue checking sugars at different times of the day - check 1x a day, rotating checks - advised for yearly eye exams >> he is UTD - advised for flu shot >> he is UTD - Return to clinic in 3 mo with sugar log     Philemon Kingdom, MD PhD [facility]

## 2016-08-10 DIAGNOSIS — E119 Type 2 diabetes mellitus without complications: Secondary | ICD-10-CM | POA: Diagnosis not present

## 2016-08-10 DIAGNOSIS — H401232 Low-tension glaucoma, bilateral, moderate stage: Secondary | ICD-10-CM | POA: Diagnosis not present

## 2016-08-18 ENCOUNTER — Encounter: Payer: Self-pay | Admitting: Internal Medicine

## 2016-08-18 ENCOUNTER — Ambulatory Visit (INDEPENDENT_AMBULATORY_CARE_PROVIDER_SITE_OTHER): Payer: Medicare Other | Admitting: Internal Medicine

## 2016-08-18 VITALS — BP 118/70 | HR 88 | Temp 97.9°F | Ht 70.75 in | Wt 225.0 lb

## 2016-08-18 DIAGNOSIS — N401 Enlarged prostate with lower urinary tract symptoms: Secondary | ICD-10-CM

## 2016-08-18 DIAGNOSIS — N138 Other obstructive and reflux uropathy: Secondary | ICD-10-CM | POA: Diagnosis not present

## 2016-08-18 DIAGNOSIS — E785 Hyperlipidemia, unspecified: Secondary | ICD-10-CM | POA: Diagnosis not present

## 2016-08-18 DIAGNOSIS — Z7189 Other specified counseling: Secondary | ICD-10-CM | POA: Diagnosis not present

## 2016-08-18 DIAGNOSIS — E1140 Type 2 diabetes mellitus with diabetic neuropathy, unspecified: Secondary | ICD-10-CM | POA: Diagnosis not present

## 2016-08-18 DIAGNOSIS — Z0000 Encounter for general adult medical examination without abnormal findings: Secondary | ICD-10-CM

## 2016-08-18 DIAGNOSIS — M159 Polyosteoarthritis, unspecified: Secondary | ICD-10-CM | POA: Diagnosis not present

## 2016-08-18 DIAGNOSIS — I10 Essential (primary) hypertension: Secondary | ICD-10-CM | POA: Diagnosis not present

## 2016-08-18 LAB — HM DIABETES FOOT EXAM

## 2016-08-18 NOTE — Assessment & Plan Note (Signed)
May need to go back to Dr Lorelei Pont for knee pain

## 2016-08-18 NOTE — Progress Notes (Signed)
Subjective:    Patient ID: Lucas [name], male    DOB: 10/26/1940, 76 y.o.   MRN: [id number]  HPI Here for Medicare wellness and follow up of chronic health conditions Reviewed form and advanced directives Reviewed other doctors---Dr Meda Coffee (rheum), Dr Cruzita Lederer (endo) No tobacco or alcohol Tries to walk but limited by knee and hip pain. Now mostly water aerobics Some vision issues--- see below Hearing loss --but barely uses his hearing aides (uses amplifier for TV) [place] once --tripped coming out of house. No sig injury No depression or anhedonia Independent with instrumental ADLs No significant memory problems  Sees Dr Meda Coffee for gout Has positive RF but no clear RA Can't walk at mall anymore--really hurts knee Has seen Dr Lorelei Pont about his hip pain  Left eye visual field is decreasing (Dr Delman Cheadle) Relates to glaucoma probably-- new eye drop given Clearly no retinopathy  Diabetes has been okay Working with Dr Lanell Matar on the gabapentin for neuropathy Recent A[facility] at [facility]-- 8% Most of his high readings are in the morning  Some urinary problems--does get some urgency No incontinence but some issues with travelling Nocturia is better since using CPAP  Current Outpatient Prescriptions on File Prior to Visit  Medication Sig Dispense Refill  . albuterol (VENTOLIN HFA) 108 (90 BASE) MCG/ACT inhaler Inhale 2 puffs into the lungs every 4 (four) hours as needed.      Marland Kitchen allopurinol (ZYLOPRIM) 300 MG tablet Take 1 tab daily for 90 days    . aspirin 81 MG tablet Take 81 mg by mouth daily.      . brimonidine (ALPHAGAN) 0.2 % ophthalmic solution Place 1 drop into both eyes 2 (two) times daily.     . cetirizine (ZYRTEC) 10 MG tablet Take 10 mg by mouth daily.    . colchicine 0.6 MG tablet Take 0.6 mg by mouth as needed. Take 2 tablets by mouth at first sign of gout flare followed by 1 tablet after 1 hour. Next day 1 tab;et for 5 days    . Dulaglutide (TRULICITY) 1.5 HA/1.9FX SOPN  Inject 1.5 mg under skin on Sundays 12 pen 3  . fluticasone (FLONASE) 50 MCG/ACT nasal spray     . gabapentin (NEURONTIN) 300 MG capsule Take 2 capsules (600 mg total) by mouth 3 (three) times daily. 180 capsule 3  . glipiZIDE (GLUCOTROL XL) 5 MG 24 hr tablet Take 1 tablet in am and 1 crushed tablet before diner 180 tablet 3  . indomethacin (INDOCIN) 50 MG capsule Take 1 capsule (50 mg total) by mouth 2 (two) times daily with a meal. 60 capsule 1  . ketoconazole (NIZORAL) 2 % cream     . latanoprost (XALATAN) 0.005 % ophthalmic solution Place 1 drop into both eyes at bedtime.     Marland Kitchen lisinopril (PRINIVIL,ZESTRIL) 20 MG tablet Take 10 mg by mouth daily.     . metFORMIN (GLUCOPHAGE) 1000 MG tablet Take 1,000 mg by mouth 2 (two) times daily with a meal.      . Misc Natural Products (GLUCOSAMINE CHONDROITIN ADV PO) Take 2 tablets by mouth 2 (two) times daily.    . Multiple Vitamin (MULTIVITAMIN) tablet Take 1 tablet by mouth daily.      . pravastatin (PRAVACHOL) 20 MG tablet Take 10 mg by mouth daily.     . sildenafil (REVATIO) 20 MG tablet TAKE 3-5 TABLETS BY MOUTH DAILY AS NEEDED. 50 tablet 11  . Vitamin D, Cholecalciferol, 1000 UNITS CAPS Take 1 capsule  by mouth daily.     No current facility-administered medications on file prior to visit.     No Known Allergies  Past Medical History:  Diagnosis Date  . Allergic rhinitis   . Diabetes mellitus type II   . Diverticulosis of colon   . Glaucoma   . History of agent Orange exposure   . HLD (hyperlipidemia)   . HTN (hypertension)   . Hypertrophy of prostate without urinary obstruction and other lower urinary tract symptoms (LUTS)   . Osteoarthritis     No past surgical history on file.  Family History  Problem Relation Age of Onset  . Colon cancer Father   . Stroke Mother   . Heart attack Mother   . Coronary artery disease Mother   . Hypertension Unknown        several siblings  . Diabetes Unknown        several siblings  .  Coronary artery disease Brother   . ALS Sister     Social History   Social History  . Marital status: Married    Spouse name: N/A  . Number of children: N/A  . Years of education: N/A   Occupational History  . Retired-[employer]    Social History Main Topics  . Smoking status: Never Smoker  . Smokeless tobacco: Never Used  . Alcohol use No  . Drug use: No  . Sexual activity: Not on file   Other Topics Concern  . Not on file   Social History Narrative   [place] vet- 20% disability from shrapnel wounds 1967 and 20% agent orange   Married (Widowed 1996; remarried 6/03)   Retired [place] living will   Wife, then daughter Hoyle Barr will be health care POA   Would want attempts at resuscitation   Would not want feeding tube if cognitively unaware   Review of Systems Sleeps well with the CPAP Appetite is not as good--knows he has to eat Weight down slightly Partial and crowns---keeps up with dentist (Dr Nyoka Cowden) Wears seat belt Bowels slower with truliciity---uses miralax prn Itchy rash on left calf--got steroid cream and it helped. Chronic groin rash since [place] No heartburn or dysphagia    Objective:   Physical Exam  Constitutional: He is oriented to person, place, and time. He appears well-nourished. No distress.  HENT:  Mouth/Throat: Oropharynx is clear and moist. No oropharyngeal exudate.  Neck: No thyromegaly present.  Cardiovascular: Normal rate, regular rhythm, normal heart sounds and intact distal pulses.  Exam reveals no gallop.   No murmur heard. Faint pedal pulses  Pulmonary/Chest: Effort normal and breath sounds normal. No respiratory distress. He has no wheezes. He has no rales.  Abdominal: Soft. There is no tenderness.  Musculoskeletal: He exhibits no edema.  Lymphadenopathy:    He has no cervical adenopathy.  Neurological: He is alert and oriented to person, place, and time.  President--- "[name], [name]" [phone] [id number] Recall  2/3  Slightly decreased sensation in feet  Skin: No erythema.  No foot lesions  Psychiatric: He has a normal mood and affect. His behavior is normal.          Assessment & Plan:

## 2016-08-18 NOTE — Assessment & Plan Note (Signed)
Not ready for meds yet 

## 2016-08-18 NOTE — Assessment & Plan Note (Signed)
Lab Results  Component Value Date   HGBA1C 7.7 05/08/2016   Reasonable control Working with Dr Cruzita Lederer

## 2016-08-18 NOTE — Assessment & Plan Note (Signed)
I have personally reviewed the Medicare Annual Wellness questionnaire and have noted 1. The patient's medical and social history 2. Their use of alcohol, tobacco or illicit drugs 3. Their current medications and supplements 4. The patient's functional ability including ADL's, fall risks, home safety risks and hearing or visual             impairment. 5. Diet and physical activities 6. Evidence for depression or mood disorders  The patients weight, height, BMI and visual acuity have been recorded in the chart I have made referrals, counseling and provided education to the patient based review of the above and I have provided the pt with a written personalized care plan for preventive services.  I have provided you with a copy of your personalized plan for preventive services. Please take the time to review along with your updated medication list.  Thinks he got pneumovax booster at [facility] No PSA due to age Recent colonoscopy Trying to exercise

## 2016-08-18 NOTE — Assessment & Plan Note (Signed)
BP Readings from Last 3 Encounters:  08/18/16 118/70  08/08/16 118/62  05/08/16 132/88   Good control

## 2016-08-18 NOTE — Assessment & Plan Note (Signed)
No problems with statin 

## 2016-08-18 NOTE — Assessment & Plan Note (Signed)
See social history 

## 2016-08-30 ENCOUNTER — Ambulatory Visit: Payer: Medicare Other | Admitting: Family Medicine

## 2016-09-21 DIAGNOSIS — E119 Type 2 diabetes mellitus without complications: Secondary | ICD-10-CM | POA: Diagnosis not present

## 2016-09-21 DIAGNOSIS — H401232 Low-tension glaucoma, bilateral, moderate stage: Secondary | ICD-10-CM | POA: Diagnosis not present

## 2016-09-22 ENCOUNTER — Other Ambulatory Visit: Payer: Self-pay | Admitting: Ophthalmology

## 2016-09-22 ENCOUNTER — Other Ambulatory Visit: Payer: Self-pay

## 2016-09-22 DIAGNOSIS — H401232 Low-tension glaucoma, bilateral, moderate stage: Secondary | ICD-10-CM

## 2016-09-22 MED ORDER — GLUCOSE BLOOD VI STRP: ORAL_STRIP | 5 refills | Status: AC

## 2016-09-25 ENCOUNTER — Ambulatory Visit: Payer: Medicare Other | Admitting: Family Medicine

## 2016-09-28 ENCOUNTER — Other Ambulatory Visit: Payer: Self-pay | Admitting: Family Medicine

## 2016-09-28 NOTE — Telephone Encounter (Signed)
Last office visit 08/18/2016.  Last refilled 01/20/2016 for #180 with 3 refills by Dr. Lorelei Pont.  Ok to refill?

## 2016-09-28 NOTE — Telephone Encounter (Signed)
Approved: okay to refill for a year 

## 2016-10-09 ENCOUNTER — Other Ambulatory Visit: Payer: Self-pay | Admitting: Ophthalmology

## 2016-10-09 ENCOUNTER — Ambulatory Visit
Admission: RE | Admit: 2016-10-09 | Discharge: 2016-10-09 | Disposition: A | Discharge status: Home / Self Care | Payer: Medicare Other | Source: Ambulatory Visit | Attending: Ophthalmology | Admitting: Ophthalmology

## 2016-10-09 DIAGNOSIS — H409 Unspecified glaucoma: Secondary | ICD-10-CM | POA: Diagnosis not present

## 2016-10-09 DIAGNOSIS — H401232 Low-tension glaucoma, bilateral, moderate stage: Secondary | ICD-10-CM

## 2016-10-09 DIAGNOSIS — M795 Residual foreign body in soft tissue: Secondary | ICD-10-CM

## 2016-10-09 DIAGNOSIS — S40252A Superficial foreign body of left shoulder, initial encounter: Secondary | ICD-10-CM | POA: Diagnosis not present

## 2016-10-09 MED ORDER — IOPAMIDOL (ISOVUE-300) INJECTION 61%
75.0000 mL | Freq: Once | INTRAVENOUS | Status: AC | PRN
  Administered 2016-10-09: 75 mL via INTRAVENOUS

## 2016-10-10 ENCOUNTER — Other Ambulatory Visit: Payer: Medicare Other

## 2016-10-16 DIAGNOSIS — R768 Other specified abnormal immunological findings in serum: Secondary | ICD-10-CM | POA: Diagnosis not present

## 2016-10-16 DIAGNOSIS — N183 Chronic kidney disease, stage 3 (moderate): Secondary | ICD-10-CM | POA: Diagnosis not present

## 2016-10-16 DIAGNOSIS — M5136 Other intervertebral disc degeneration, lumbar region: Secondary | ICD-10-CM | POA: Diagnosis not present

## 2016-10-16 DIAGNOSIS — M1A00X0 Idiopathic chronic gout, unspecified site, without tophus (tophi): Secondary | ICD-10-CM | POA: Diagnosis not present

## 2016-11-14 ENCOUNTER — Encounter: Payer: Self-pay | Admitting: Physician Assistant

## 2016-11-14 ENCOUNTER — Ambulatory Visit (INDEPENDENT_AMBULATORY_CARE_PROVIDER_SITE_OTHER): Payer: Medicare Other | Admitting: Physician Assistant

## 2016-11-14 VITALS — BP 110/70 | HR 68 | Temp 98.1°F | Ht 70.75 in | Wt 226.0 lb

## 2016-11-14 DIAGNOSIS — E119 Type 2 diabetes mellitus without complications: Secondary | ICD-10-CM | POA: Diagnosis not present

## 2016-11-14 DIAGNOSIS — M2540 Effusion, unspecified joint: Secondary | ICD-10-CM | POA: Diagnosis not present

## 2016-11-14 DIAGNOSIS — R21 Rash and other nonspecific skin eruption: Secondary | ICD-10-CM

## 2016-11-14 DIAGNOSIS — E1165 Type 2 diabetes mellitus with hyperglycemia: Secondary | ICD-10-CM

## 2016-11-14 DIAGNOSIS — H401232 Low-tension glaucoma, bilateral, moderate stage: Secondary | ICD-10-CM | POA: Diagnosis not present

## 2016-11-14 LAB — C-REACTIVE PROTEIN: CRP: 0.2 mg/dL — ABNORMAL LOW (ref 0.5–20.0)

## 2016-11-14 LAB — COMPREHENSIVE METABOLIC PANEL
ALT: 22 U/L (ref 0–53)
AST: 26 U/L (ref 0–37)
Albumin: 4.3 g/dL (ref 3.5–5.2)
Alkaline Phosphatase: 43 U/L (ref 39–117)
BUN: 16 mg/dL (ref 6–23)
CO2: 25 mEq/L (ref 19–32)
Calcium: 9.1 mg/dL (ref 8.4–10.5)
Chloride: 103 mEq/L (ref 96–112)
Creatinine, Ser: 1.14 mg/dL (ref 0.40–1.50)
GFR: 80.57 mL/min (ref >60.00)
Glucose, Bld: 141 mg/dL — ABNORMAL HIGH (ref 70–99)
Potassium: 4.8 mEq/L (ref 3.5–5.1)
Sodium: 136 mEq/L (ref 135–145)
Total Bilirubin: 0.3 mg/dL (ref 0.2–1.2)
Total Protein: 7.1 g/dL (ref 6.0–8.3)

## 2016-11-14 LAB — CBC WITH DIFFERENTIAL/PLATELET
Basophils Absolute: 0 10*3/uL (ref 0.0–0.1)
Basophils Relative: 0.5 % (ref 0.0–3.0)
Eosinophils Absolute: 0.1 10*3/uL (ref 0.0–0.7)
Eosinophils Relative: 4.3 % (ref 0.0–5.0)
HCT: 41 % (ref 39.0–52.0)
Hemoglobin: 12.7 g/dL — ABNORMAL LOW (ref 13.0–17.0)
Lymphocytes Relative: 52 % — ABNORMAL HIGH (ref 12.0–46.0)
Lymphs Abs: 1.8 10*3/uL (ref 0.7–4.0)
MCHC: 31 g/dL (ref 30.0–36.0)
MCV: 86.5 fl (ref 78.0–100.0)
Monocytes Absolute: 0.2 10*3/uL (ref 0.1–1.0)
Monocytes Relative: 7.1 % (ref 3.0–12.0)
Neutro Abs: 1.2 10*3/uL — ABNORMAL LOW (ref 1.4–7.7)
Neutrophils Relative %: 36.1 % — ABNORMAL LOW (ref 43.0–77.0)
Platelets: 238 10*3/uL (ref 150.0–400.0)
RBC: 4.74 Mil/uL (ref 4.22–5.81)
RDW: 16.9 % — ABNORMAL HIGH (ref 11.5–15.5)
WBC: 3.4 10*3/uL — ABNORMAL LOW (ref 4.0–10.5)

## 2016-11-14 LAB — URIC ACID: Uric Acid, Serum: 4.1 mg/dL (ref 4.0–7.8)

## 2016-11-14 LAB — SEDIMENTATION RATE: Sed Rate: 24 mm/hr — ABNORMAL HIGH (ref 0–20)

## 2016-11-14 MED ORDER — TRIAMCINOLONE ACETONIDE 0.5 % EX OINT: 1.0000 "application " | TOPICAL_OINTMENT | Freq: Two times a day (BID) | CUTANEOUS | 0 refills | Status: AC

## 2016-11-14 MED ORDER — CIPROFLOXACIN HCL 250 MG PO TABS: 250.0000 mg | ORAL_TABLET | Freq: Two times a day (BID) | ORAL | 0 refills | Status: DC

## 2016-11-14 NOTE — Progress Notes (Signed)
Lucas [name] is a 76 y.o. male here for Rash.  I acted as a Education administrator for [employer], PA-C Anselmo Pickler, LPN  History of Present Illness:   Chief Complaint  Patient presents with  . Rash    Bilateral legs, x 1 week    Rash  This is a new problem. Episode onset: x 1 week. The problem has been gradually worsening since onset. The rash is diffuse (Bilateral upper and lower legs). The rash is characterized by redness and itchiness. He was exposed to a new medication (2 weeks ago started steroid cream on left knee). Pertinent negatives include no diarrhea, fever, shortness of breath, sore throat or vomiting. Past treatments include moisturizer. The treatment provided no relief.   2-3 weeks ago went to [facility], stayed in [facility]. No tick bites that he is aware of. Was in some tall grass while fishing. Out on the water for a day. He does do water aerobics and sits in the sauna a few times a week.  Used a cream for his knee that his PT gave him, said it was OTC and for pain. Symptoms started after application of this product.  He is a diabetic. He admits that he does not consistently check his blood sugars.  Lab Results  Component Value Date   HGBA1C 7.7 05/08/2016   He also has a history of gout. Last seen by provider for this approximately 1 month ago.   Past Medical History:  Diagnosis Date  . Allergic rhinitis   . Diabetes mellitus type II   . Diverticulosis of colon   . Glaucoma   . History of agent Orange exposure   . HLD (hyperlipidemia)   . HTN (hypertension)   . Hypertrophy of prostate without urinary obstruction and other lower urinary tract symptoms (LUTS)   . Osteoarthritis      Social History   Social History  . Marital status: Married    Spouse name: N/A  . Number of children: N/A  . Years of education: N/A   Occupational History  . Retired-[employer]    Social History Main Topics  . Smoking status: Never Smoker  . Smokeless tobacco:  Never Used  . Alcohol use No  . Drug use: No  . Sexual activity: Not on file   Other Topics Concern  . Not on file   Social History Narrative   [place] vet- 20% disability from shrapnel wounds 1967 and 20% agent orange   Married (Widowed 1996; remarried 6/03)   Retired [employer] living will   Wife, then daughter Hoyle Barr will be health care POA   Would want attempts at resuscitation   Would not want feeding tube if cognitively unaware    No past surgical history on file.  Family History  Problem Relation Age of Onset  . Colon cancer Father   . Stroke Mother   . Heart attack Mother   . Coronary artery disease Mother   . Hypertension Unknown        several siblings  . Diabetes Unknown        several siblings  . Coronary artery disease Brother   . ALS Sister     No Known Allergies  Current Medications:   Current Outpatient Prescriptions:  .  albuterol (VENTOLIN HFA) 108 (90 BASE) MCG/ACT inhaler, Inhale 2 puffs into the lungs every 4 (four) hours as needed.  , Disp: , Rfl:  .  allopurinol (ZYLOPRIM) 300  MG tablet, Take 1 tab daily for 90 days, Disp: , Rfl:  .  aspirin 81 MG tablet, Take 81 mg by mouth daily.  , Disp: , Rfl:  .  brimonidine (ALPHAGAN) 0.2 % ophthalmic solution, Place 1 drop into both eyes 2 (two) times daily. , Disp: , Rfl:  .  cetirizine (ZYRTEC) 10 MG tablet, Take 10 mg by mouth daily., Disp: , Rfl:  .  colchicine 0.6 MG tablet, Take 0.6 mg by mouth as needed. Take 2 tablets by mouth at first sign of gout flare followed by 1 tablet after 1 hour. Next day 1 tab;et for 5 days, Disp: , Rfl:  .  Dulaglutide (TRULICITY) 1.5 XH/3.7JI SOPN, Inject 1.5 mg under skin on Sundays, Disp: 12 pen, Rfl: 3 .  gabapentin (NEURONTIN) 300 MG capsule, TAKE 2 CAPSULES (600 MG TOTAL) BY MOUTH 3 (THREE) TIMES DAILY., Disp: 180 capsule, Rfl: 3 .  glipiZIDE (GLUCOTROL XL) 5 MG 24 hr tablet, Take 1 tablet in am and 1 crushed tablet before diner, Disp: 180 tablet, Rfl: 3 .   glucose blood (ONE TOUCH ULTRA TEST) test strip, Use as instructed to check sugar 2 times daily, Disp: 200 each, Rfl: 5 .  indomethacin (INDOCIN) 50 MG capsule, Take 1 capsule (50 mg total) by mouth 2 (two) times daily with a meal., Disp: 60 capsule, Rfl: 1 .  ketoconazole (NIZORAL) 2 % cream, , Disp: , Rfl:  .  latanoprost (XALATAN) 0.005 % ophthalmic solution, Place 1 drop into both eyes at bedtime. , Disp: , Rfl:  .  lisinopril (PRINIVIL,ZESTRIL) 20 MG tablet, Take 10 mg by mouth daily. , Disp: , Rfl:  .  metFORMIN (GLUCOPHAGE) 1000 MG tablet, Take 1,000 mg by mouth 2 (two) times daily with a meal.  , Disp: , Rfl:  .  Misc Natural Products (GLUCOSAMINE CHONDROITIN ADV PO), Take 2 tablets by mouth 2 (two) times daily., Disp: , Rfl:  .  Multiple Vitamin (MULTIVITAMIN) tablet, Take 1 tablet by mouth daily.  , Disp: , Rfl:  .  pravastatin (PRAVACHOL) 20 MG tablet, Take 10 mg by mouth daily. , Disp: , Rfl:  .  sildenafil (REVATIO) 20 MG tablet, TAKE 3-5 TABLETS BY MOUTH DAILY AS NEEDED., Disp: 50 tablet, Rfl: 11 .  Vitamin D, Cholecalciferol, 1000 UNITS CAPS, Take 1 capsule by mouth daily., Disp: , Rfl:  .  ciprofloxacin (CIPRO) 250 MG tablet, Take 1 tablet (250 mg total) by mouth 2 (two) times daily., Disp: 14 tablet, Rfl: 0 .  fluticasone (FLONASE) 50 MCG/ACT nasal spray, , Disp: , Rfl:  .  triamcinolone ointment (KENALOG) 0.5 %, Apply 1 application topically 2 (two) times daily., Disp: 30 g, Rfl: 0   Review of Systems:   Review of Systems  Constitutional: Negative for chills, fever, malaise/fatigue and weight loss.  HENT: Negative for sore throat.   Eyes: Negative for blurred vision.  Respiratory: Negative for shortness of breath.   Gastrointestinal: Negative for abdominal pain, diarrhea, heartburn, nausea and vomiting.  Skin: Positive for rash. Negative for itching.  Neurological: Negative for dizziness and headaches.    Vitals:   Vitals:   11/14/16 1200  BP: 110/70  Pulse: 68   Temp: 98.1 F (36.7 C)  TempSrc: Oral  SpO2: 96%  Weight: 226 lb (102.5 kg)  Height: 5' 10.75" (1.797 m)     Body mass index is 31.74 kg/m.  Physical Exam:   Physical Exam  Constitutional: He appears well-developed. He is cooperative.  Non-toxic appearance.  He does not have a sickly appearance. He does not appear ill. No distress.  Cardiovascular: Normal rate, regular rhythm, S1 normal, S2 normal, normal heart sounds and normal pulses.   No LE edema  Pulmonary/Chest: Effort normal and breath sounds normal.  Neurological: He is alert. GCS eye subscore is 4. GCS verbal subscore is 5. GCS motor subscore is 6.  Skin: Skin is warm, dry and intact.  Numerous erythematous scattered papules predominately on bilateral lower legs, no areas of open/draining, or tenderness -- ranging from a few mm in size to size of a dime  Prior injection site of base of R great toe is red and tender with deep palpation (patient states that he had an injection for gout here about 2 months ago)  Psychiatric: He has a normal mood and affect. His speech is normal and behavior is normal.  Nursing note and vitals reviewed.   Assessment and Plan:    Leotis was seen today for rash.  Diagnoses and all orders for this visit:  Rash Dr. Juleen China in to evaluate patient. Possible folliculitis. Treat with oral cipro given possible pool/sauna exposures and given DM status. Also provided topical kenalog for topical application. Advised patient to let us know if symptoms worsen or do not improve with treatment. Red flags provided in written form. -     CBC with Differential/Platelet -     Comprehensive metabolic panel -     C-reactive protein -     Sedimentation rate  Swollen joint No evidence of acute infection in prior gout site, however did recommend that patient see his PCP or other providers to discuss this if it worsens or persists. -     CBC with Differential/Platelet -     Comprehensive metabolic panel -      C-reactive protein -     Sedimentation rate -     Uric acid  Type 2 diabetes mellitus with hyperglycemia, without long-term current use of insulin (HCC) Discussed medication risks of cipro and glipizide -- recommended staying on top of blood sugars and to avoid getting too low. Patient verbalized understanding.  Other orders -     ciprofloxacin (CIPRO) 250 MG tablet; Take 1 tablet (250 mg total) by mouth 2 (two) times daily. -     triamcinolone ointment (KENALOG) 0.5 %; Apply 1 application topically 2 (two) times daily.    . Reviewed expectations re: course of current medical issues. . Discussed self-management of symptoms. . Outlined signs and symptoms indicating need for more acute intervention. . Patient verbalized understanding and all questions were answered. . See orders for this visit as documented in the electronic medical record. . Patient received an After-Visit Summary.  CMA or LPN served as scribe during this visit. History, Physical, and Plan performed by medical provider. Documentation and orders reviewed and attested to.  Inda Coke, PA-C

## 2016-11-14 NOTE — Patient Instructions (Signed)
It was great to meet you!  Start the antibiotic, ciprofloxacin. Watch your blood sugars with this medication, don't let them get too low!  Apply the ointment to the affected area as recommended.  If you develop fevers, pain or the lesions start draining pus, please let someone know and seek medical attention.  Please have one of your medical providers look at your toe, especially if the pain persists.

## 2016-11-22 DIAGNOSIS — R768 Other specified abnormal immunological findings in serum: Secondary | ICD-10-CM | POA: Diagnosis not present

## 2016-11-22 DIAGNOSIS — R21 Rash and other nonspecific skin eruption: Secondary | ICD-10-CM | POA: Diagnosis not present

## 2016-11-22 DIAGNOSIS — M1A00X0 Idiopathic chronic gout, unspecified site, without tophus (tophi): Secondary | ICD-10-CM | POA: Diagnosis not present

## 2016-11-22 DIAGNOSIS — M5136 Other intervertebral disc degeneration, lumbar region: Secondary | ICD-10-CM | POA: Diagnosis not present

## 2016-11-24 ENCOUNTER — Ambulatory Visit: Payer: Medicare Other | Admitting: Internal Medicine

## 2016-11-29 ENCOUNTER — Ambulatory Visit (INDEPENDENT_AMBULATORY_CARE_PROVIDER_SITE_OTHER): Payer: Medicare Other | Admitting: Internal Medicine

## 2016-11-29 ENCOUNTER — Encounter: Payer: Self-pay | Admitting: Internal Medicine

## 2016-11-29 VITALS — BP 122/80 | HR 84 | Ht 71.0 in | Wt 226.0 lb

## 2016-11-29 DIAGNOSIS — E785 Hyperlipidemia, unspecified: Secondary | ICD-10-CM | POA: Diagnosis not present

## 2016-11-29 DIAGNOSIS — E1165 Type 2 diabetes mellitus with hyperglycemia: Secondary | ICD-10-CM | POA: Diagnosis not present

## 2016-11-29 LAB — POCT GLYCOSYLATED HEMOGLOBIN (HGB A1C): Hemoglobin A1C: 8.3

## 2016-11-29 MED ORDER — GLIPIZIDE ER 5 MG PO TB24: ORAL_TABLET | ORAL | 3 refills | Status: DC

## 2016-11-29 NOTE — Patient Instructions (Addendum)
Please continue: - Metformin but move it all (2000 mg) with dinner - Trulicity 1.5 mg weekly.  Please increase: - Glipizide XL to 10 mg in am and 5 mg XL tablet  Please eliminate: meat, cheese, eggs. Try to not eat after 7 pm. Eat as many veggies as you want.  Please return in 3 months with your sugar log.

## 2016-11-29 NOTE — Progress Notes (Signed)
Patient ID: Lucas [name], male   DOB: September 30, 1940, 76 y.o.   MRN: [id number]  HPI: Lucas [name] is a 76 y.o.-year-old male, returning for f/u for DM2, dx in 1998, non-insulin-dependent (previous Agent Orange exposure in [place]), uncontrolled, without complications. Last visit 3.5 mo ago.  He traveled a lot over the summer. He also had a gout exacerbation. He was on a Prednisone taper. He still has dietary indiscretions. Sugars are higher.  Last hemoglobin A1c was: 07/13/2016: HbA1c 8.0% Lab Results  Component Value Date   HGBA1C 7.7 05/08/2016   HGBA1C 7.7 12/17/2015   HGBA1C 8.8 (H) 07/28/2015  04/15/2015: 7.6%  Pt is on a regimen of: - Metformin 1000 mg 2x a day, with meals  - Glipizide XL 5 mg in am and 5 mg XL tablet before dinner - Trulicity 7.74 mg weekly - started 02/2016 >> 1.5 mg weekly  - had constipation >> now better. He had to stop [place] as K increased (took 1 mo, off for 1.5 mo). Invokana was not covered.  Pt checks his sugars 1-3x a day:  - am: 106-202 >> 127-180, 196, 200 >> 146-243 - 2h after b'fast: n/c >> 150-189 >> 184 >> 189 >> n/c - before lunch:186 >> n/c >> 94, 111, 178 >> 163, 168 - 2h after lunch:  n/c  >> 94-228 >> 116, 140 >> n/c - before dinner: 89-147, 172 >> 104-138, 233 >> 122, 150-249 - 2h after dinner: 89-193, 267 >> n/c >> 139 >> n/c - bedtime:  143-276 >> 122-155, 228 >> 82, 93 >> n/c - nighttime: n/c >> 165 >> n/c No lows. Lowest sugar was 82 >> 122; he has hypoglycemia awareness at 70.  Highest sugar was 228 >> 249.  Glucometer: Precision >> One Touch Ultra mini  Pt's meals are: - Breakfast:  Oatmeal, wheat English muffin  - Lunch: salad, 1/2 sandwich - Dinner: meat + veggies, no bread - Snacks: 2x a day, fruit bar, fresh fruit  He continues to do: water exercises - 45 min 4x a week.  - + mild CKD, last BUN/creatinine:  07/13/2016: BUN/Cr 13/1.0, Glu 116, UACR 0.5 11/10/2015: EGFR 80, creatinine 1.1 Lab Results  Component  Value Date   BUN 16 11/14/2016   CREATININE 1.14 11/14/2016  04/15/2015: GFR 88.1, Cr 1.06 On Lisinopril. - last set of lipids: 07/13/2016: 179/288/44/77 Lab Results  Component Value Date   CHOL 179 07/13/2016   HDL 44 07/13/2016   LDLCALC 104 07/13/2016   LDLDIRECT 75.7 07/02/2012   TRIG 160.0 (H) 07/28/2015   CHOLHDL 5 07/28/2015  04/15/2015: LDL 89 On Pravastatin. - last dilated eye exam was in 07/2016 >> No DR. He has non-tension glaucoma. - denies numbness and tingling in his feet.  He was in a DM clinical trial before: "Jump Start" lifestyle Research Trial >> once a month >> counseling sessions - 1 year pgm.  He also has a history of hypertension, ED. He has gout - on Alopurinol >> Uloric (b/c rash with Alopurinol).   ROS: Constitutional: no weight gain/+ weight loss (not per our scale), no fatigue, no subjective hyperthermia, no subjective hypothermia, + nocturia Eyes: no blurry vision, no xerophthalmia ENT: no sore throat, no nodules palpated in throat, no dysphagia, no odynophagia, no hoarseness Cardiovascular: no CP/no SOB/no palpitations/no leg swelling Respiratory: no cough/no SOB/no wheezing Gastrointestinal: no N/no V/no D/no C/no acid reflux Musculoskeletal: no muscle aches/+ joint aches Skin:+ rash (will see dermatology tomorrow), no hair loss Neurological: no tremors/no numbness/no  tingling/no dizziness + diff with erections  I reviewed pt's medications, allergies, PMH, social hx, family hx, and changes were documented in the history of present illness. Otherwise, unchanged from my initial visit note.   Past Medical History:  Diagnosis Date  . Allergic rhinitis   . Diabetes mellitus type II   . Diverticulosis of colon   . Glaucoma   . History of agent Orange exposure   . HLD (hyperlipidemia)   . HTN (hypertension)   . Hypertrophy of prostate without urinary obstruction and other lower urinary tract symptoms (LUTS)   . Osteoarthritis    No past  surgical history on file.   History   Social History  . Marital Status: Married    Spouse Name: N/A  . Number of Children: 2   Occupational History  . Retired-[employer]    Social History Main Topics  . Smoking status: Never Smoker   . Smokeless tobacco: Never Used  . Alcohol Use: No  . Drug Use: No    [place] vet- 20% disability from shrapnel wounds 1967 and 20% agent orange   Married (Widowed 1996; remarried 6/03)   Retired [employer]      Has living will   Wife, then daughter Lucas [name] will be health care POA   Would want attempts at resuscitation   Would not want feeding tube if cognitively unaware   Current Outpatient Prescriptions on File Prior to Visit  Medication Sig Dispense Refill  . albuterol (VENTOLIN HFA) 108 (90 BASE) MCG/ACT inhaler Inhale 2 puffs into the lungs every 4 (four) hours as needed.      Marland Kitchen aspirin 81 MG tablet Take 81 mg by mouth daily.      . brimonidine (ALPHAGAN) 0.2 % ophthalmic solution Place 1 drop into both eyes 2 (two) times daily.     . cetirizine (ZYRTEC) 10 MG tablet Take 10 mg by mouth daily.    . colchicine 0.6 MG tablet Take 0.6 mg by mouth as needed. Take 2 tablets by mouth at first sign of gout flare followed by 1 tablet after 1 hour. Next day 1 tab;et for 5 days    . Dulaglutide (TRULICITY) 1.5 YJ/8.5UD SOPN Inject 1.5 mg under skin on Sundays 12 pen 3  . fluticasone (FLONASE) 50 MCG/ACT nasal spray     . gabapentin (NEURONTIN) 300 MG capsule TAKE 2 CAPSULES (600 MG TOTAL) BY MOUTH 3 (THREE) TIMES DAILY. 180 capsule 3  . glipiZIDE (GLUCOTROL XL) 5 MG 24 hr tablet Take 1 tablet in am and 1 crushed tablet before diner 180 tablet 3  . glucose blood (ONE TOUCH ULTRA TEST) test strip Use as instructed to check sugar 2 times daily 200 each 5  . indomethacin (INDOCIN) 50 MG capsule Take 1 capsule (50 mg total) by mouth 2 (two) times daily with a meal. 60 capsule 1  . ketoconazole (NIZORAL) 2 % cream     . latanoprost (XALATAN) 0.005 % ophthalmic solution  Place 1 drop into both eyes at bedtime.     Marland Kitchen lisinopril (PRINIVIL,ZESTRIL) 20 MG tablet Take 10 mg by mouth daily.     . metFORMIN (GLUCOPHAGE) 1000 MG tablet Take 1,000 mg by mouth 2 (two) times daily with a meal.      . Misc Natural Products (GLUCOSAMINE CHONDROITIN ADV PO) Take 2 tablets by mouth 2 (two) times daily.    . Multiple Vitamin (MULTIVITAMIN) tablet Take 1 tablet by mouth daily.      . pravastatin (PRAVACHOL) 20 MG tablet  Take 10 mg by mouth daily.     . sildenafil (REVATIO) 20 MG tablet TAKE 3-5 TABLETS BY MOUTH DAILY AS NEEDED. 50 tablet 11  . triamcinolone ointment (KENALOG) 0.5 % Apply 1 application topically 2 (two) times daily. 30 g 0  . Vitamin D, Cholecalciferol, 1000 UNITS CAPS Take 1 capsule by mouth daily.     No current facility-administered medications on file prior to visit.    No Known Allergies Family History  Problem Relation Age of Onset  . Colon cancer Father   . Stroke Mother   . Heart attack Mother   . Coronary artery disease Mother   . Hypertension Unknown        several siblings  . Diabetes Unknown        several siblings  . Coronary artery disease Brother   . ALS Sister    PE: BP 122/80 (BP Location: Left Arm, Patient Position: Sitting)   Pulse 84   Ht 5' 11"  (1.803 m)   Wt 226 lb (102.5 kg)   SpO2 96%   BMI 31.52 kg/m  Body mass index is 31.52 kg/m. Wt Readings from Last 3 Encounters:  11/29/16 226 lb (102.5 kg)  11/14/16 226 lb (102.5 kg)  08/18/16 225 lb (102.1 kg)   Constitutional: overweight, in NAD Eyes: PERRLA, EOMI, no exophthalmos ENT: moist mucous membranes, no thyromegaly, no cervical lymphadenopathy Cardiovascular: RRR, No MRG Respiratory: CTA B Gastrointestinal: abdomen soft, NT, ND, BS+ Musculoskeletal: no deformities, strength intact in all 4 Skin: moist, warm, no rashes Neurological: no tremor with outstretched hands, DTR normal in all 4  ASSESSMENT: 1. DM2, non-insulin-dependent, uncontrolled, with  hyperglycemia  2. HL  PLAN:  1. Patient with long-standing, uncontrolled DM, on oral antidiabetic regimen + Trulicity, with worse control at this visit due to multiple factors: traveling, diet, steroids. We again discussed about the importance of diet and also exercise (which he is doing) but we also discussed about moving Metformin all at night to hopefully improve his am sugars. Will also increase am Glipizide. - I suggested to:  Patient Instructions  Please continue: - Metformin but move it all (2000 mg) with dinner - Trulicity 1.5 mg weekly.  Please increase: - Glipizide XL to 10 mg in am and 5 mg XL tablet  Please eliminate: meat, cheese, eggs. Try to not eat after 7 pm. Eat as many veggies as you want.  Please return in 3 months with your sugar log.   - today, HbA1c is 8.3% (worse) - continue checking sugars at different times of the day - check 2x a day, rotating checks - advised for yearly eye exams >> he is UTD - Return to clinic in 3 mo with sugar log   2. HL - TG elevated at last check in 07/2016 - we discussed about changing his diet to eliminate fatty foods: meta, cheese eggs - continue Pravastatin.  Philemon Kingdom, MD PhD [facility] Endocrinology

## 2016-11-30 DIAGNOSIS — R21 Rash and other nonspecific skin eruption: Secondary | ICD-10-CM | POA: Diagnosis not present

## 2016-11-30 DIAGNOSIS — L280 Lichen simplex chronicus: Secondary | ICD-10-CM | POA: Diagnosis not present

## 2016-11-30 DIAGNOSIS — L439 Lichen planus, unspecified: Secondary | ICD-10-CM | POA: Diagnosis not present

## 2016-11-30 DIAGNOSIS — L404 Guttate psoriasis: Secondary | ICD-10-CM | POA: Diagnosis not present

## 2016-12-21 DIAGNOSIS — M5136 Other intervertebral disc degeneration, lumbar region: Secondary | ICD-10-CM | POA: Diagnosis not present

## 2016-12-21 DIAGNOSIS — M10071 Idiopathic gout, right ankle and foot: Secondary | ICD-10-CM | POA: Diagnosis not present

## 2016-12-21 DIAGNOSIS — N183 Chronic kidney disease, stage 3 (moderate): Secondary | ICD-10-CM | POA: Diagnosis not present

## 2016-12-21 DIAGNOSIS — E119 Type 2 diabetes mellitus without complications: Secondary | ICD-10-CM | POA: Diagnosis not present

## 2016-12-21 DIAGNOSIS — M1A00X0 Idiopathic chronic gout, unspecified site, without tophus (tophi): Secondary | ICD-10-CM | POA: Diagnosis not present

## 2017-01-04 DIAGNOSIS — L400 Psoriasis vulgaris: Secondary | ICD-10-CM | POA: Diagnosis not present

## 2017-01-17 ENCOUNTER — Encounter: Payer: Self-pay | Admitting: Family Medicine

## 2017-01-17 ENCOUNTER — Ambulatory Visit (INDEPENDENT_AMBULATORY_CARE_PROVIDER_SITE_OTHER)
Admission: RE | Admit: 2017-01-17 | Discharge: 2017-01-17 | Disposition: A | Discharge status: Home / Self Care | Payer: Medicare Other | Source: Ambulatory Visit | Attending: Family Medicine | Admitting: Family Medicine

## 2017-01-17 ENCOUNTER — Ambulatory Visit (INDEPENDENT_AMBULATORY_CARE_PROVIDER_SITE_OTHER): Payer: Medicare Other | Admitting: Family Medicine

## 2017-01-17 VITALS — BP 100/54 | HR 90 | Temp 98.3°F | Ht 71.0 in | Wt 227.5 lb

## 2017-01-17 DIAGNOSIS — M25552 Pain in left hip: Secondary | ICD-10-CM | POA: Diagnosis not present

## 2017-01-17 DIAGNOSIS — M545 Low back pain, unspecified: Secondary | ICD-10-CM

## 2017-01-17 DIAGNOSIS — M1612 Unilateral primary osteoarthritis, left hip: Secondary | ICD-10-CM | POA: Diagnosis not present

## 2017-01-17 DIAGNOSIS — Z23 Encounter for immunization: Secondary | ICD-10-CM | POA: Diagnosis not present

## 2017-01-17 DIAGNOSIS — M79605 Pain in left leg: Secondary | ICD-10-CM

## 2017-01-17 NOTE — Patient Instructions (Signed)

## 2017-01-17 NOTE — Progress Notes (Signed)
Dr. Frederico Hamman T. Fama Muenchow, MD, [facility] [place], [address] Phone: [phone] Fax: [phone]  01/17/2017  Patient: Lucas [name], MRN: [id number], DOB: December 26, 1940, 76 y.o.  Primary Physician:  [name], MD   Chief Complaint  Patient presents with  . Hip Pain    Dragging left leg   Subjective:   Lucas [name] is a 76 y.o. very pleasant male patient who presents with the following:  Doing some water aerobics, pain is better, Will get some pain in the posterior hip and dragging his hip. Not moving it. Pain in the buttocks area.   I saw him 01/2016, and again several years before this. Ongoing pain x several years at least.  He has been doing water aerobics and this is helped his pain in his back quite a bit, but he still has pain in the buttocks region.  Occasionally will radiate somewhat below this.  He also describes pain that he has pain in his hip, but he does not really have significant groin pain.  He is continent.  Past Medical History, Surgical History, Social History, Family History, Problem List, Medications, and Allergies have been reviewed and updated if relevant.  Patient Active Problem List   Diagnosis Date Noted  . Type 2 diabetes mellitus with hyperglycemia, without long-term current use of insulin ([place]) 08/06/2015  . Type 2 diabetes, controlled, with neuropathy ([place]) 07/28/2015  . Advance directive discussed with patient 07/28/2015  . Obstructive sleep apnea 07/22/2014  . Routine general medical examination at a health care facility 06/06/2011  . BPH with obstruction/lower urinary tract symptoms   . Generalized osteoarthritis of multiple sites 09/20/2006  . Hyperlipidemia 09/17/2006  . GLAUCOMA 09/17/2006  . Essential hypertension, benign 09/17/2006  . ALLERGIC RHINITIS 09/17/2006  . DIVERTICULOSIS, COLON 09/17/2006    Past Medical History:  Diagnosis Date  . Allergic rhinitis     . Diabetes mellitus type II   . Diverticulosis of colon   . Glaucoma   . History of agent Orange exposure   . HLD (hyperlipidemia)   . HTN (hypertension)   . Hypertrophy of prostate without urinary obstruction and other lower urinary tract symptoms (LUTS)   . Osteoarthritis     No past surgical history on file.  Social History   Social History  . Marital status: Married    Spouse name: N/A  . Number of children: N/A  . Years of education: N/A   Occupational History  . Retired-[employer]    Social History Main Topics  . Smoking status: Never Smoker  . Smokeless tobacco: Never Used  . Alcohol use No  . Drug use: No  . Sexual activity: Not on file   Other Topics Concern  . 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 Colon cancer Father   . Stroke Mother   . Heart attack Mother   . Coronary artery disease Mother   . Hypertension Unknown        several siblings  . Diabetes Unknown  several siblings  . Coronary artery disease Brother   . ALS Sister     No Known Allergies  Medication list reviewed and updated in full in [facility].  GEN: No fevers, chills. Nontoxic. Primarily MSK c/o today. MSK: Detailed in the HPI GI: tolerating PO intake without difficulty Neuro: No numbness, parasthesias, or tingling associated. Otherwise the pertinent positives of the ROS are noted above.   Objective:   BP (!) 100/54   Pulse 90   Temp 98.3 F (36.8 C) (Oral)   Ht 5\' 11"  (1.803 m)   Wt 227 lb 8 oz (103.2 kg)   BMI 31.73 kg/m    GEN: WDWN, NAD, Non-toxic, Alert & Oriented x 3 HEENT: Atraumatic, Normocephalic.   Ears and Nose: No external deformity. EXTR: No clubbing/cyanosis/edema NEURO: Normal gait.  PSYCH: Normally interactive. Conversant. Not depressed or anxious appearing.  Calm demeanor.   HIP EXAM: SIDE: L ROM: Abduction, Flexion, Internal and External range of motion: full, somewhat tight Pain with terminal IROM and EROM: minimal GTB: NT SLR: NEG Knees: No effusion FABER: NT REVERSE FABER: NT, neg Piriformis: NT at direct palpation Str: flexion: 5/5 abduction: 4+/5 adduction: 5/5 Strength testing non-tender     Radiology: Dg Hip Unilat With Pelvis 2-3 Views Left  Result Date: 01/17/2017 CLINICAL DATA:  Left hip pain ongoing. EXAM: DG HIP (WITH OR WITHOUT PELVIS) 2-3V LEFT COMPARISON:  None. FINDINGS: Weightbearing AP view of the pelvis as well as AP and frogleg views of the left hip are provided. Moderate disc space narrowing of the included lumbar spine from L3 caudad with associated mild facet arthropathy characterized by sclerosis. The SI joints and pubic symphysis are intact. Slight left hip joint space narrowing relative to right. No acute fracture of the bony pelvis and either hip. A 4 mm metallic soft tissue foreign body is noted along the proximal left thigh laterally. IMPRESSION: 1. Lumbar spondylosis. 2. 4 mm metallic foreign body within the soft tissues along the lateral aspect of the proximal left thigh. 3. Slight degenerative joint space narrowing of the left hip relative to right. No acute fracture or suspicious osseous abnormalities. Electronically Signed   By: Ashley Royalty M.D.   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  Assessment and Plan:   Lumbar pain with radiation down left leg - Plan: Ambulatory referral to Physical Therapy  Left hip pain - Plan: DG HIP UNILAT WITH PELVIS 2-3 VIEWS LEFT, Ambulatory referral to Physical Therapy  Need for prophylactic vaccination and inoculation against  influenza - Plan: Flu Vaccine QUAD 36+ mos IM  Known spondyloarthropathy and degenerative disc disease.  Discussed with the patient of this is often challenging.  If we can get him to improved at any point better than he is now, then it would be a when.  Continue with water aerobics.  Formal physical therapy for least a month, and then continue with exercise.  If he continues to be significantly symptomatic, then we could do other evaluation such as MRIs or potentially interventional procedures.  Follow-up: No Follow-up on file.  Future Appointments Date Time Provider [place]  08/20/2017 10:30 AM [name], MD [facility]    Meds ordered this encounter  Medications  . COMBIGAN 0.2-0.5 % ophthalmic solution  . fluocinonide cream (LIDEX) 0.05 %    Sig: APPLY TWICE DAILY TO AFFECTED AREAS OF LEGS&ARMS UNTIL CLEAR,THEN AS NEEDED. AVOID Jobe Igo  Refill:  0  . ULORIC 40 MG tablet    Sig: Take 40 mg by mouth daily.    Refill:  4   Medications Discontinued During This Encounter  Medication Reason  . ciprofloxacin (CIPRO) 250 MG tablet Completed Course   Orders Placed This Encounter  Procedures  . DG HIP UNILAT WITH PELVIS 2-3 VIEWS LEFT  . Flu Vaccine QUAD 36+ mos IM  . Ambulatory referral to Physical Therapy    Signed,  Frederico Hamman T. Sharlette Jansma, MD   Allergies as of 01/17/2017   No Known Allergies     Medication List       Accurate as of 01/17/17 11:59 PM. Always use your most recent med list.          aspirin 81 MG tablet Take 81 mg by mouth daily.   brimonidine 0.2 % ophthalmic solution Commonly known as:  ALPHAGAN Place 1 drop into both eyes 2 (two) times daily.   cetirizine 10 MG tablet Commonly known as:  ZYRTEC Take 10 mg by mouth daily.   colchicine 0.6 MG tablet Take 0.6 mg by mouth as needed. Take 2 tablets by mouth at first sign of gout flare followed by 1 tablet after 1 hour. Next day 1 tab;et for 5 days   COMBIGAN  0.2-0.5 % ophthalmic solution Generic drug:  brimonidine-timolol   Dulaglutide 1.5 MG/0.5ML Sopn Commonly known as:  TRULICITY Inject 1.5 mg under skin on Sundays   fluocinonide cream 0.05 % Commonly known as:  LIDEX APPLY TWICE DAILY TO AFFECTED AREAS OF LEGS&ARMS UNTIL CLEAR,THEN AS NEEDED. AVOID FACE,GROIN,AXILLA   fluticasone 50 MCG/ACT nasal spray Commonly known as:  FLONASE   gabapentin 300 MG capsule Commonly known as:  NEURONTIN TAKE 2 CAPSULES (600 MG TOTAL) BY MOUTH 3 (THREE) TIMES DAILY.   glipiZIDE 5 MG 24 hr tablet Commonly known as:  GLUCOTROL XL Take 2 tablets in am and 1 tablet before diner   GLUCOSAMINE CHONDROITIN ADV PO Take 2 tablets by mouth 2 (two) times daily.   glucose blood test strip Commonly known as:  ONE TOUCH ULTRA TEST Use as instructed to check sugar 2 times daily   indomethacin 50 MG capsule Commonly known as:  INDOCIN Take 1 capsule (50 mg total) by mouth 2 (two) times daily with a meal.   ketoconazole 2 % cream Commonly known as:  NIZORAL   latanoprost 0.005 % ophthalmic solution Commonly known as:  XALATAN Place 1 drop into both eyes at bedtime.   lisinopril 20 MG tablet Commonly known as:  PRINIVIL,ZESTRIL Take 10 mg by mouth daily.   metFORMIN 1000 MG tablet Commonly known as:  GLUCOPHAGE Take 1,000 mg by mouth 2 (two) times daily with a meal.   multivitamin tablet Take 1 tablet by mouth daily.   pravastatin 20 MG tablet Commonly known as:  PRAVACHOL Take 10 mg by mouth daily.   sildenafil 20 MG tablet Commonly known as:  REVATIO TAKE 3-5 TABLETS BY MOUTH DAILY AS NEEDED.   triamcinolone ointment 0.5 % Commonly known as:  KENALOG Apply 1 application topically 2 (two) times daily.   ULORIC 40 MG tablet Generic drug:  febuxostat Take 40 mg by mouth daily.   VENTOLIN HFA 108 (90 Base) MCG/ACT inhaler Generic drug:  albuterol Inhale 2 puffs into the lungs every 4 (four) hours as needed.   Vitamin D  (Cholecalciferol) 1000 units Caps Take 1 capsule by mouth daily.

## 2017-01-29 DIAGNOSIS — M5136 Other intervertebral disc degeneration, lumbar region: Secondary | ICD-10-CM | POA: Diagnosis not present

## 2017-01-29 DIAGNOSIS — M545 Low back pain: Secondary | ICD-10-CM | POA: Diagnosis not present

## 2017-02-08 DIAGNOSIS — M5136 Other intervertebral disc degeneration, lumbar region: Secondary | ICD-10-CM | POA: Diagnosis not present

## 2017-02-08 DIAGNOSIS — M545 Low back pain: Secondary | ICD-10-CM | POA: Diagnosis not present

## 2017-02-13 DIAGNOSIS — M5136 Other intervertebral disc degeneration, lumbar region: Secondary | ICD-10-CM | POA: Diagnosis not present

## 2017-02-13 DIAGNOSIS — H401232 Low-tension glaucoma, bilateral, moderate stage: Secondary | ICD-10-CM | POA: Diagnosis not present

## 2017-02-13 DIAGNOSIS — M545 Low back pain: Secondary | ICD-10-CM | POA: Diagnosis not present

## 2017-02-13 DIAGNOSIS — E119 Type 2 diabetes mellitus without complications: Secondary | ICD-10-CM | POA: Diagnosis not present

## 2017-02-14 ENCOUNTER — Ambulatory Visit (INDEPENDENT_AMBULATORY_CARE_PROVIDER_SITE_OTHER): Payer: Medicare Other

## 2017-02-14 ENCOUNTER — Encounter: Payer: Self-pay | Admitting: Podiatry

## 2017-02-14 ENCOUNTER — Ambulatory Visit (INDEPENDENT_AMBULATORY_CARE_PROVIDER_SITE_OTHER): Payer: Medicare Other | Admitting: Podiatry

## 2017-02-14 DIAGNOSIS — M1A00X0 Idiopathic chronic gout, unspecified site, without tophus (tophi): Secondary | ICD-10-CM

## 2017-02-14 DIAGNOSIS — E11621 Type 2 diabetes mellitus with foot ulcer: Secondary | ICD-10-CM

## 2017-02-14 DIAGNOSIS — M109 Gout, unspecified: Secondary | ICD-10-CM

## 2017-02-14 DIAGNOSIS — E1140 Type 2 diabetes mellitus with diabetic neuropathy, unspecified: Secondary | ICD-10-CM

## 2017-02-14 DIAGNOSIS — L97515 Non-pressure chronic ulcer of other part of right foot with muscle involvement without evidence of necrosis: Secondary | ICD-10-CM | POA: Diagnosis not present

## 2017-02-14 MED ORDER — MUPIROCIN 2 % EX OINT: TOPICAL_OINTMENT | CUTANEOUS | 2 refills | Status: AC

## 2017-02-14 NOTE — Progress Notes (Signed)
He presents today with chief concern of a superficial blister medial aspect of the right foot. He states that he swims everyday and he notices a white granular drainage from the hole. He states that it is moderately tender on palpation but really needs never seems to be infected. States it has been present for at least 4 months. He relates a history of gout and diabetes mellitus.  Objective: Vital signs are stable alert and oriented 3 pulses are +3 out of 4 dorsiflexion plantar flexion inverters and everters all musculature is intact neurologic sensorium is slightly diminished persons once the monofilament to the tufts of the toes. Orthopedic evaluation of x-rays mild hallux valgus deformity. Mild hammertoe deformities. Cutaneous evaluation doesn't restrict superficial ulceration measuring approximately 3 mm in diameter medial head of the first metatarsal with some tophus drainage. I see no signs of infection.  Assessment: Diabetic ulceration with draining of the tophus from gout attack.  Plan: Placed padding all flow the ulceration. I will also request Bactroban ointment to be applied a small amount daily and for him to discontinue swimming until this heals up. I also dispensed a Darco shoe.

## 2017-02-15 DIAGNOSIS — M545 Low back pain: Secondary | ICD-10-CM | POA: Diagnosis not present

## 2017-02-15 DIAGNOSIS — M5136 Other intervertebral disc degeneration, lumbar region: Secondary | ICD-10-CM | POA: Diagnosis not present

## 2017-02-20 DIAGNOSIS — M5136 Other intervertebral disc degeneration, lumbar region: Secondary | ICD-10-CM | POA: Diagnosis not present

## 2017-02-20 DIAGNOSIS — M545 Low back pain: Secondary | ICD-10-CM | POA: Diagnosis not present

## 2017-03-05 ENCOUNTER — Encounter: Payer: Self-pay | Admitting: Podiatry

## 2017-03-05 ENCOUNTER — Ambulatory Visit (INDEPENDENT_AMBULATORY_CARE_PROVIDER_SITE_OTHER): Payer: Medicare Other | Admitting: Podiatry

## 2017-03-05 DIAGNOSIS — M109 Gout, unspecified: Secondary | ICD-10-CM

## 2017-03-05 DIAGNOSIS — E1140 Type 2 diabetes mellitus with diabetic neuropathy, unspecified: Secondary | ICD-10-CM | POA: Diagnosis not present

## 2017-03-05 MED ORDER — DOXYCYCLINE HYCLATE 100 MG PO TABS: 100.0000 mg | ORAL_TABLET | Freq: Two times a day (BID) | ORAL | 0 refills | Status: DC

## 2017-03-05 NOTE — Progress Notes (Signed)
He presents today for follow-up of ulceration to the medial aspect second digit of the right foot. He states it really doesn't hurt but I have noticed that a more swollen and red over the past couple days. He has discontinued swimming at this point.  Objective: Vital signs are stable he is alert and oriented 3. Superficial ulceration medial aspect right foot does not demonstrate any improvement. As a matter fact there is worsening of the erythema surrounding the wound. There is no purulence there is no malodor tophi is visible through the wound. Fibrous deposition is also visible around the margin. Surrounding erythema of approximately 2 to have centimeters.  Assessment chronic gout resulting in a ulceration medial aspect of the first metatarsophalangeal joint ulceration measures less than 3 mm in diameter.  Plan: Start him on doxycycline today and he will continue his current therapies of daily dressing changes offloading from pressure. I also would like to consider Krystexxa infusion if he is not improved next visit.

## 2017-03-12 DIAGNOSIS — M5136 Other intervertebral disc degeneration, lumbar region: Secondary | ICD-10-CM | POA: Diagnosis not present

## 2017-03-12 DIAGNOSIS — M545 Low back pain: Secondary | ICD-10-CM | POA: Diagnosis not present

## 2017-03-19 ENCOUNTER — Ambulatory Visit: Payer: Medicare Other | Admitting: Podiatry

## 2017-03-20 DIAGNOSIS — M10071 Idiopathic gout, right ankle and foot: Secondary | ICD-10-CM | POA: Diagnosis not present

## 2017-03-20 DIAGNOSIS — Z79899 Other long term (current) drug therapy: Secondary | ICD-10-CM | POA: Diagnosis not present

## 2017-03-20 DIAGNOSIS — M5136 Other intervertebral disc degeneration, lumbar region: Secondary | ICD-10-CM | POA: Diagnosis not present

## 2017-03-20 DIAGNOSIS — R768 Other specified abnormal immunological findings in serum: Secondary | ICD-10-CM | POA: Diagnosis not present

## 2017-03-20 DIAGNOSIS — M1A00X1 Idiopathic chronic gout, unspecified site, with tophus (tophi): Secondary | ICD-10-CM | POA: Diagnosis not present

## 2017-03-26 ENCOUNTER — Encounter: Payer: Self-pay | Admitting: Podiatry

## 2017-03-26 ENCOUNTER — Ambulatory Visit (INDEPENDENT_AMBULATORY_CARE_PROVIDER_SITE_OTHER): Payer: Medicare Other | Admitting: Podiatry

## 2017-03-26 DIAGNOSIS — M109 Gout, unspecified: Secondary | ICD-10-CM | POA: Diagnosis not present

## 2017-03-26 NOTE — Progress Notes (Signed)
He presents today for follow-up of the on again off again ulceration to the medial aspect of the hallux right.  He denies fever chills nausea vomiting muscle aches and pains states that he continues to have pain about the first metatarsophalangeal joint and that the ulceration is open today.  He states that he just had a uric acid drawn which was a 4.1.  He states that his blood sugars under decent control with his endocrinologist and his primary care provider.  He is requesting a pair of diabetic shoes.  Objective: Vital signs are stable he is alert and oriented x3 pulses remain palpable the dorsalis pedis is slightly diminished.  Capillary fill time a bit sluggish bilaterally but his feet are cool to the touch.  No discoloration.  Hallux right does demonstrate mild erythema around the first metatarsal phalangeal joint superficial ulceration with gouty tophus visualized through the wound right first metatarsophalangeal joint.  No purulence no malodor.  Assessment: Chronic gouty tophaceous gout with superficial ulceration medial aspect of the first metatarsophalangeal joint.  Diabetes mellitus with hallux valgus deformity hammertoe deformities and ulceration.  Plan: At this point I would like to have him evaluated by [facility] for small vessel disease.  We also going to get paperwork started for diabetic shoes as well as the prescription Krystexxa.  A dry sterile compressive dressing was applied today.  He will continue use of his Darco shoe.

## 2017-03-27 ENCOUNTER — Telehealth: Payer: Self-pay | Admitting: *Deleted

## 2017-03-27 ENCOUNTER — Ambulatory Visit: Payer: Medicare Other | Admitting: Orthotics

## 2017-03-27 DIAGNOSIS — M722 Plantar fascial fibromatosis: Secondary | ICD-10-CM

## 2017-03-27 DIAGNOSIS — E1140 Type 2 diabetes mellitus with diabetic neuropathy, unspecified: Secondary | ICD-10-CM

## 2017-03-27 DIAGNOSIS — L97515 Non-pressure chronic ulcer of other part of right foot with muscle involvement without evidence of necrosis: Secondary | ICD-10-CM

## 2017-03-27 DIAGNOSIS — E11621 Type 2 diabetes mellitus with foot ulcer: Secondary | ICD-10-CM

## 2017-03-27 NOTE — Telephone Encounter (Signed)
Faxed form [email or web address], clinicals and demographics to A. Venable for pt to sign and inform of financials for assistance.

## 2017-03-27 NOTE — Telephone Encounter (Signed)
-----   Message from Rip Harbour, [facility] sent at 03/26/2017 11:40 AM EST ----- Regarding: Vascular/Krystexxa Angie- referral for vascular studies and consult - diabetic with PVD  Val- Dr. Milinda Pointer wants to see if we can get him on Krystexxa infusions/currently on Uloric  Thanks!!

## 2017-03-27 NOTE — Progress Notes (Signed)
Patient was measured and cast today for diabetic shoes upo recomm Dr. Milinda Pointer.Marland Kitchen PCP is Layla Maw  Patient measrued 11W on [place] and was cast in foam  Patient chose [employer] [id number]

## 2017-03-29 ENCOUNTER — Other Ambulatory Visit: Payer: Self-pay | Admitting: Internal Medicine

## 2017-03-29 ENCOUNTER — Telehealth: Payer: Self-pay

## 2017-03-29 DIAGNOSIS — I739 Peripheral vascular disease, unspecified: Secondary | ICD-10-CM

## 2017-03-29 NOTE — Telephone Encounter (Signed)
-----   Message from Rip Harbour, [facility] sent at 03/26/2017 11:40 AM EST ----- Regarding: Vascular/Krystexxa Angie- referral for vascular studies and consult - diabetic with PVD  Val- Dr. Milinda Pointer wants to see if we can get him on Krystexxa infusions/currently on Uloric  Thanks!!

## 2017-04-21 ENCOUNTER — Other Ambulatory Visit: Payer: Self-pay | Admitting: Internal Medicine

## 2017-04-23 ENCOUNTER — Ambulatory Visit (INDEPENDENT_AMBULATORY_CARE_PROVIDER_SITE_OTHER): Payer: Medicare Other | Admitting: Vascular Surgery

## 2017-04-23 ENCOUNTER — Encounter (INDEPENDENT_AMBULATORY_CARE_PROVIDER_SITE_OTHER): Payer: Self-pay | Admitting: Vascular Surgery

## 2017-04-23 VITALS — BP 134/80 | HR 81 | Resp 17 | Ht 71.0 in | Wt 227.0 lb

## 2017-04-23 DIAGNOSIS — E1165 Type 2 diabetes mellitus with hyperglycemia: Secondary | ICD-10-CM | POA: Diagnosis not present

## 2017-04-23 DIAGNOSIS — N183 Chronic kidney disease, stage 3 unspecified: Secondary | ICD-10-CM

## 2017-04-23 DIAGNOSIS — E785 Hyperlipidemia, unspecified: Secondary | ICD-10-CM

## 2017-04-23 DIAGNOSIS — I10 Essential (primary) hypertension: Secondary | ICD-10-CM

## 2017-04-23 DIAGNOSIS — L97512 Non-pressure chronic ulcer of other part of right foot with fat layer exposed: Secondary | ICD-10-CM | POA: Diagnosis not present

## 2017-04-23 NOTE — Progress Notes (Signed)
Subjective:    Patient ID: Lucas [name], male    DOB: [birth year], 77 y.o.   MRN: [id number] Chief Complaint  Patient presents with  . New Patient (Initial Visit)    ref by Milinda Pointer for PVD   Presents as a new patient referred by Dr. Milinda Pointer for "peripheral artery disease".  The patient endorses a history of an ulceration to the lateral aspect of his first right toe for approximately 5 months.  The patient has been receiving local wound care from Dr. Milinda Pointer with minimal improvement to his wound.  Dr. Milinda Pointer is currently treating this wound with an ointment.  Dr. Milinda Pointer would like to treat him with what sounds like IV antibiotics wants to rule out any peripheral artery disease that may inhibit the antibiotics from working.  The patient denies any claudication-like symptoms or rest pain to the bilateral lower extremity.  The patient denies any lower extremity edema.  Patient denies any fever, nausea vomiting.  Patient denies any necrotic tissue to the area.   Review of Systems  Constitutional: Negative.   HENT: Negative.   Eyes: Negative.   Respiratory: Negative.   Cardiovascular: Negative.   Gastrointestinal: Negative.   Endocrine: Negative.   Genitourinary: Negative.   Musculoskeletal: Negative.   Skin: Positive for wound.  Allergic/Immunologic: Negative.   Neurological: Negative.   Hematological: Negative.   Psychiatric/Behavioral: Negative.       Objective:   Physical Exam  Constitutional: He is oriented to person, place, and time. He appears well-developed and well-nourished. No distress.  HENT:  Head: Normocephalic and atraumatic.  Eyes: Conjunctivae are normal. Pupils are equal, round, and reactive to light.  Neck: Normal range of motion.  Cardiovascular: Normal rate, regular rhythm, normal heart sounds and intact distal pulses.  Pulses:      Radial pulses are 2+ on the right side, and 2+ on the left side.       Dorsalis pedis pulses are 2+ on the right side, and 2+ on the left  side.       Posterior tibial pulses are 2+ on the right side, and 2+ on the left side.  Pulmonary/Chest: Effort normal and breath sounds normal.  Musculoskeletal: Normal range of motion. He exhibits no edema.  Neurological: He is alert and oriented to person, place, and time.  Skin: He is not diaphoretic.  Patient with a less than 1 cm by less than 1 cm circular wound to the lateral aspect of the first right big toe.  The surrounding skin is healthy.  There is no drainage.  Fat layer is exposed.  There is no cellulitis.  There is no stasis dermatitis.  Psychiatric: He has a normal mood and affect. His behavior is normal. Judgment and thought content normal.  Vitals reviewed.  BP 134/80 (BP Location: Right Arm)   Pulse 81   Resp 17   Ht 5\' 11"  (1.803 m)   Wt 227 lb (103 kg)   BMI 31.66 kg/m   Past Medical History:  Diagnosis Date  . Allergic rhinitis   . Diabetes mellitus type II   . Diverticulosis of colon   . Glaucoma   . History of agent Orange exposure   . HLD (hyperlipidemia)   . HTN (hypertension)   . Hypertrophy of prostate without urinary obstruction and other lower urinary tract symptoms (LUTS)   . Osteoarthritis    Social History   Socioeconomic History  . Marital status: Married    Spouse name: Not on file  .  Number of children: Not on file  . Years of education: Not on file  . Highest education level: Not on file  Social Needs  . Financial resource strain: Not on file  . Food insecurity - worry: Not on file  . Food insecurity - inability: Not on file  . Transportation needs - medical: Not on file  . Transportation needs - non-medical: Not on file  Occupational History  . Occupation: Retired-[employer]  Tobacco Use  . Smoking status: Never Smoker  . Smokeless tobacco: Never Used  Substance and Sexual Activity  . Alcohol use: No    Alcohol/week: 0.0 oz  . Drug use: No  . Sexual activity: Not on file  Other Topics Concern  . 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 TOOTH EXTRACTION     Family History  Problem Relation Age of Onset  . Colon cancer Father   . Stroke Mother   . Heart attack Mother   . Coronary artery disease Mother   . Hypertension Unknown        several siblings  . Diabetes Unknown        several siblings  . Coronary artery disease Brother   . ALS Sister    No Known Allergies     Assessment & Plan:  Presents as a new patient referred by Dr. Milinda Pointer for "peripheral artery disease".  The patient endorses a history of an ulceration to the lateral aspect of his first right toe for approximately 5 months.  The patient has been receiving local wound care from Dr. Milinda Pointer with minimal improvement to his wound.  Dr. Milinda Pointer is currently treating this wound with an ointment.  Dr. Milinda Pointer would like to treat him with what sounds like IV antibiotics wants to rule out any peripheral artery disease that may inhibit the antibiotics from working.  The patient denies any claudication-like symptoms or rest pain to the bilateral lower extremity.  The patient denies any lower extremity edema.  Patient denies any fever, nausea vomiting.  Patient denies any necrotic tissue to the area.  1. Hyperlipidemia, unspecified hyperlipidemia type - Stable Encouraged good control as its slows the progression of atherosclerotic disease  2. Kidney disease, chronic, stage III (GFR 30-59 ml/min) (HCC) Hypertension and atherosclerotic disease is directly related to kidney function. Encouraged good control as its slows the progression of atherosclerotic /kidney disease.  3. Type 2 diabetes mellitus with  hyperglycemia, without long-term current use of insulin (HCC) - Stable Encouraged good control as its slows the progression of atherosclerotic disease  4. Essential hypertension, benign - Stable Encouraged good control as its slows the progression of atherosclerotic disease  5. Skin ulcer of right foot with fat layer exposed ([place]) - New Patient with multiple risk factors for peripheral artery disease The patient does have palpable pedal pulses to the right lower extremity Due to the patient's slow healing wounds and need for IV antibiotics I will bring him back to undergo an ABI to assess blood flow down to the toes. I have discussed with the patient at length the risk factors  for and pathogenesis of atherosclerotic disease and encouraged a healthy diet, regular exercise regimen and blood pressure / glucose control.  The patient was encouraged to call the office in the interim if he experiences any claudication like symptoms, rest pain or ulcers to his feet / toes.  - VAS [place] ABI WITH/WO TBI; Future  Current Outpatient Medications on File Prior to Visit  Medication Sig Dispense Refill  . albuterol (VENTOLIN HFA) 108 (90 BASE) MCG/ACT inhaler Inhale 2 puffs into the lungs every 4 (four) hours as needed.      Marland Kitchen aspirin 81 MG tablet Take 81 mg by mouth daily.      . brimonidine (ALPHAGAN) 0.2 % ophthalmic solution Place 1 drop into both eyes 2 (two) times daily.     . cetirizine (ZYRTEC) 10 MG tablet Take 10 mg by mouth daily.    . colchicine 0.6 MG tablet Take 0.6 mg by mouth as needed. Take 2 tablets by mouth at first sign of gout flare followed by 1 tablet after 1 hour. Next day 1 tab;et for 5 days    . COMBIGAN 0.2-0.5 % ophthalmic solution     . doxycycline (VIBRA-TABS) 100 MG tablet Take 1 tablet (100 mg total) by mouth 2 (two) times daily. 20 tablet 0  . fluocinonide cream (LIDEX) 0.05 % APPLY TWICE DAILY TO AFFECTED AREAS OF LEGS&ARMS UNTIL CLEAR,THEN AS NEEDED. AVOID FACE,GROIN,AXILLA  0   . fluticasone (FLONASE) 50 MCG/ACT nasal spray     . gabapentin (NEURONTIN) 300 MG capsule TAKE 2 CAPSULES (600 MG TOTAL) BY MOUTH 3 (THREE) TIMES DAILY. 180 capsule 4  . glipiZIDE (GLUCOTROL XL) 5 MG 24 hr tablet Take 2 tablets in am and 1 tablet before diner 270 tablet 3  . glucose blood (ONE TOUCH ULTRA TEST) test strip Use as instructed to check sugar 2 times daily 200 each 5  . indomethacin (INDOCIN) 50 MG capsule Take 1 capsule (50 mg total) by mouth 2 (two) times daily with a meal. 60 capsule 1  . ketoconazole (NIZORAL) 2 % cream     . latanoprost (XALATAN) 0.005 % ophthalmic solution Place 1 drop into both eyes at bedtime.     Marland Kitchen lisinopril (PRINIVIL,ZESTRIL) 20 MG tablet Take 10 mg by mouth daily.     . metFORMIN (GLUCOPHAGE) 1000 MG tablet Take 1,000 mg by mouth 2 (two) times daily with a meal.      . Misc Natural Products (GLUCOSAMINE CHONDROITIN ADV PO) Take 2 tablets by mouth 2 (two) times daily.    . Multiple Vitamin (MULTIVITAMIN) tablet Take 1 tablet by mouth daily.      . mupirocin ointment (BACTROBAN) 2 % Apply to wound twice a day. 30 g 2  . pravastatin (PRAVACHOL) 20 MG tablet Take 10 mg by mouth daily.     . sildenafil (REVATIO) 20 MG tablet TAKE 3-5 TABLETS BY MOUTH DAILY AS NEEDED. 50 tablet 11  . triamcinolone ointment (KENALOG) 0.5 % Apply 1 application topically 2 (two) times daily. 30 g 0  . TRULICITY 1.5 DJ/2.4QA SOPN INJECT ONE PEN (1.5 MG TOTAL) UNDER SKIN ON SUNDAYS 6 pen 1  . ULORIC 40 MG tablet Take 40 mg by mouth daily.  4  . Vitamin D, Cholecalciferol, 1000 UNITS CAPS Take 1 capsule by mouth daily.     No current facility-administered medications on file prior to visit.     There are no Patient Instructions on file for this visit. No Follow-up on file.   Oluchi Pucci  A Amazin Pincock, PA-C

## 2017-04-25 ENCOUNTER — Encounter: Payer: Medicare Other | Admitting: Orthotics

## 2017-05-02 ENCOUNTER — Encounter: Payer: Medicare Other | Admitting: Orthotics

## 2017-05-04 ENCOUNTER — Encounter (INDEPENDENT_AMBULATORY_CARE_PROVIDER_SITE_OTHER): Payer: Self-pay | Admitting: Vascular Surgery

## 2017-05-04 ENCOUNTER — Ambulatory Visit (INDEPENDENT_AMBULATORY_CARE_PROVIDER_SITE_OTHER): Payer: Medicare Other | Admitting: Vascular Surgery

## 2017-05-04 ENCOUNTER — Other Ambulatory Visit (INDEPENDENT_AMBULATORY_CARE_PROVIDER_SITE_OTHER): Payer: Medicare Other

## 2017-05-04 VITALS — BP 122/78 | HR 81 | Resp 17 | Ht 71.0 in | Wt 228.0 lb

## 2017-05-04 DIAGNOSIS — L97512 Non-pressure chronic ulcer of other part of right foot with fat layer exposed: Secondary | ICD-10-CM | POA: Diagnosis not present

## 2017-05-04 DIAGNOSIS — E785 Hyperlipidemia, unspecified: Secondary | ICD-10-CM | POA: Diagnosis not present

## 2017-05-04 DIAGNOSIS — E1165 Type 2 diabetes mellitus with hyperglycemia: Secondary | ICD-10-CM

## 2017-05-04 DIAGNOSIS — L97519 Non-pressure chronic ulcer of other part of right foot with unspecified severity: Secondary | ICD-10-CM

## 2017-05-04 NOTE — Progress Notes (Signed)
Subjective:    Patient ID: Lucas [name], male    DOB: [birth year], 77 y.o.   MRN: [id number] Chief Complaint  Patient presents with  . Follow-up    STAT ABI   The patient presents today to review vascular studies.  The patient was last seen on April 23, 2017 for initial evaluation of a right toe ulceration which was present for a few months.  The patient underwent a bilateral lower extremity ABI which was notable for triphasic tibials and no evidence of significant lower extremity arterial disease.  The toe brachial indices are abnormal.  The right toe digit with Amp: of 23mm.  The patient denies any claudication-like symptoms or rest pain or new/worsening ulcer formation.  The patient denies any fever, nausea or vomiting.   Review of Systems  Constitutional: Negative.   HENT: Negative.   Eyes: Negative.   Respiratory: Negative.   Cardiovascular: Negative.   Gastrointestinal: Negative.   Endocrine: Negative.   Genitourinary: Negative.   Musculoskeletal: Negative.   Skin: Positive for wound.  Allergic/Immunologic: Negative.   Neurological: Negative.   Hematological: Negative.   Psychiatric/Behavioral: Negative.       Objective:   Physical Exam  Constitutional: He is oriented to person, place, and time. He appears well-developed and well-nourished. No distress.  HENT:  Head: Normocephalic and atraumatic.  Eyes: Conjunctivae are normal. Pupils are equal, round, and reactive to light.  Neck: Normal range of motion.  Cardiovascular: Normal rate, regular rhythm, normal heart sounds and intact distal pulses.  Pulses:      Radial pulses are 2+ on the right side, and 2+ on the left side.       Dorsalis pedis pulses are 2+ on the right side, and 2+ on the left side.       Posterior tibial pulses are 2+ on the right side, and 2+ on the left side.  Pulmonary/Chest: Effort normal and breath sounds normal.  Musculoskeletal: Normal range of motion. He exhibits no edema.  Neurological:  He is oriented to person, place, and time.  Skin: Skin is warm and dry. He is not diaphoretic.  Patient with a less than 1 cm by less than 1 cm circular wound to the lateral aspect of the first right big toe.  The surrounding skin is healthy.  There is no drainage.  Fat layer is exposed.  There is no cellulitis.  There is no stasis dermatitis.   Psychiatric: He has a normal mood and affect. His behavior is normal. Judgment and thought content normal.  Vitals reviewed.  BP 122/78 (BP Location: Right Arm, Patient Position: Sitting)   Pulse 81   Resp 17   Ht 5\' 11"  (1.803 m)   Wt 228 lb (103.4 kg)   BMI 31.80 kg/m   Past Medical History:  Diagnosis Date  . Allergic rhinitis   . Diabetes mellitus type II   . Diverticulosis of colon   . Glaucoma   . History of agent Orange exposure   . HLD (hyperlipidemia)   . HTN (hypertension)   . Hypertrophy of prostate without urinary obstruction and other lower urinary tract symptoms (LUTS)   . Osteoarthritis    Social History   Socioeconomic History  . Marital status: Married    Spouse name: Not on file  . Number of children: Not on file  . Years of education: Not on file  . Highest education level: Not on file  Social Needs  . Financial resource strain: Not on file  .  Food insecurity - worry: Not on file  . Food insecurity - inability: Not on file  . Transportation needs - medical: Not on file  . Transportation needs - non-medical: Not on file  Occupational History  . Occupation: Retired-[employer]  Tobacco Use  . Smoking status: Never Smoker  . Smokeless tobacco: Never Used  Substance and Sexual Activity  . Alcohol use: No    Alcohol/week: 0.0 oz  . Drug use: No  . Sexual activity: Not on file  Other Topics Concern  . 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 TOOTH EXTRACTION     Family History  Problem Relation Age of Onset  . Colon cancer Father   . Stroke Mother   . Heart attack Mother   . Coronary artery disease Mother   . Hypertension Unknown        several siblings  . Diabetes Unknown        several siblings  . Coronary artery disease Brother   . ALS Sister    No Known Allergies     Assessment & Plan:  The patient presents today to review vascular studies.  The patient was last seen on April 23, 2017 for initial evaluation of a right toe ulceration which was present for a few months.  The patient underwent a bilateral lower extremity ABI which was notable for triphasic tibials and no evidence of significant lower extremity arterial disease.  The toe brachial indices are abnormal.  The right toe digit with Amp: of 38mm.  The patient denies any claudication-like symptoms or rest pain or new/worsening ulcer formation.  The patient denies any fever, nausea or vomiting.  1. Skin ulcer of right great toe, unspecified ulcer stage ([place]) - Stable The patient underwent a bilateral ABI today to assess for any peripheral artery disease that may inhibit IV antibiotics that Dr. Milinda Pointer would like to start for his right big toe ulceration. Digit waveforms more robust however a right lower extremity angiogram would not extend into the smaller vessels of the foot. Overall the patient has no lower extremity arterial occlusive disease and I think he should do well with IV antibiotics. Patient to follow-up as needed The patient was given 2 copies of his ABI 1 for himself and 1 for Dr. Milinda Pointer  2. Type 2 diabetes mellitus with hyperglycemia, without long-term current use of insulin (HCC) - Stable Encouraged good control as its slows the progression of atherosclerotic  disease  3. Hyperlipidemia, unspecified hyperlipidemia type - Stable Encouraged good control as its slows the progression of atherosclerotic disease  Current Outpatient Medications on File Prior to Visit  Medication Sig Dispense Refill  . albuterol (VENTOLIN HFA) 108 (90 BASE) MCG/ACT inhaler Inhale 2 puffs into the lungs every 4 (four) hours as needed.      Marland Kitchen aspirin 81 MG tablet Take 81 mg by mouth daily.      . brimonidine (ALPHAGAN) 0.2 % ophthalmic solution Place 1 drop into both eyes 2 (two) times daily.     . cetirizine (ZYRTEC) 10 MG tablet Take 10 mg by mouth  daily.    . colchicine 0.6 MG tablet Take 0.6 mg by mouth as needed. Take 2 tablets by mouth at first sign of gout flare followed by 1 tablet after 1 hour. Next day 1 tab;et for 5 days    . COMBIGAN 0.2-0.5 % ophthalmic solution     . doxycycline (VIBRA-TABS) 100 MG tablet Take 1 tablet (100 mg total) by mouth 2 (two) times daily. 20 tablet 0  . fluocinonide cream (LIDEX) 0.05 % APPLY TWICE DAILY TO AFFECTED AREAS OF LEGS&ARMS UNTIL CLEAR,THEN AS NEEDED. AVOID FACE,GROIN,AXILLA  0  . fluticasone (FLONASE) 50 MCG/ACT nasal spray     . gabapentin (NEURONTIN) 300 MG capsule TAKE 2 CAPSULES (600 MG TOTAL) BY MOUTH 3 (THREE) TIMES DAILY. 180 capsule 4  . glipiZIDE (GLUCOTROL XL) 5 MG 24 hr tablet Take 2 tablets in am and 1 tablet before diner 270 tablet 3  . glucose blood (ONE TOUCH ULTRA TEST) test strip Use as instructed to check sugar 2 times daily 200 each 5  . indomethacin (INDOCIN) 50 MG capsule Take 1 capsule (50 mg total) by mouth 2 (two) times daily with a meal. 60 capsule 1  . ketoconazole (NIZORAL) 2 % cream     . latanoprost (XALATAN) 0.005 % ophthalmic solution Place 1 drop into both eyes at bedtime.     Marland Kitchen lisinopril (PRINIVIL,ZESTRIL) 20 MG tablet Take 10 mg by mouth daily.     . metFORMIN (GLUCOPHAGE) 1000 MG tablet Take 1,000 mg by mouth 2 (two) times daily with a meal.      . Misc Natural Products (GLUCOSAMINE  CHONDROITIN ADV PO) Take 2 tablets by mouth 2 (two) times daily.    . Multiple Vitamin (MULTIVITAMIN) tablet Take 1 tablet by mouth daily.      . mupirocin ointment (BACTROBAN) 2 % Apply to wound twice a day. 30 g 2  . pravastatin (PRAVACHOL) 20 MG tablet Take 10 mg by mouth daily.     . sildenafil (REVATIO) 20 MG tablet TAKE 3-5 TABLETS BY MOUTH DAILY AS NEEDED. 50 tablet 11  . sildenafil (VIAGRA) 50 MG tablet     . triamcinolone ointment (KENALOG) 0.5 % Apply 1 application topically 2 (two) times daily. 30 g 0  . TRULICITY 1.5 XM/1.4JW SOPN INJECT ONE PEN (1.5 MG TOTAL) UNDER SKIN ON SUNDAYS 6 pen 1  . ULORIC 40 MG tablet Take 40 mg by mouth daily.  4  . Vitamin D, Cholecalciferol, 1000 UNITS CAPS Take 1 capsule by mouth daily.     No current facility-administered medications on file prior to visit.    There are no Patient Instructions on file for this visit. No Follow-up on file.  Mando Blatz A Anglea Gordner, PA-C

## 2017-05-07 ENCOUNTER — Telehealth: Payer: Self-pay | Admitting: *Deleted

## 2017-05-07 NOTE — Telephone Encounter (Signed)
Pt wanted to know when he could get the results of his vascular test.

## 2017-05-10 ENCOUNTER — Encounter: Payer: Self-pay | Admitting: Internal Medicine

## 2017-05-10 ENCOUNTER — Ambulatory Visit (INDEPENDENT_AMBULATORY_CARE_PROVIDER_SITE_OTHER): Payer: Medicare Other | Admitting: Internal Medicine

## 2017-05-10 VITALS — BP 120/70 | HR 85 | Ht 71.0 in | Wt 228.0 lb

## 2017-05-10 DIAGNOSIS — E785 Hyperlipidemia, unspecified: Secondary | ICD-10-CM | POA: Diagnosis not present

## 2017-05-10 DIAGNOSIS — E1165 Type 2 diabetes mellitus with hyperglycemia: Secondary | ICD-10-CM | POA: Diagnosis not present

## 2017-05-10 LAB — POCT GLYCOSYLATED HEMOGLOBIN (HGB A1C): Hemoglobin A1C: 7.4

## 2017-05-10 NOTE — Progress Notes (Signed)
Patient ID: Lucas [name], male   DOB: June 29, 1940, 77 y.o.   MRN: [id number]  HPI: Lucas [name] is a 77 y.o.-year-old male, returning for f/u for DM2, dx in 1998, non-insulin-dependent (previous Agent Orange exposure in [place]), uncontrolled, without complications. Last visit 5 mo ago.  He has a R foot ulcer. He is seen by a vascular dr. Lynnell Dike normal.  Last hemoglobin A1c was: Lab Results  Component Value Date   HGBA1C 8.3 11/29/2016   HGBA1C 7.7 05/08/2016   HGBA1C 7.7 12/17/2015  07/13/2016: HbA1c 8.0% 04/15/2015: 7.6%  Pt is on a regimen of: - Metformin 1000 mg 2x a day, with meals >> 2000 mg with dinner - Glipizide XL 5 mg in am and 5 mg XL tablet before dinner  >> 10 mg in a.m. and 5 mg in p.m. - Trulicity 4.09 mg weekly - started 02/2016 >> 1.5 mg weekly  -previously constipation, now better He had to stop [place] as K increased (took 1 mo, off for 1.5 mo). Invokana was not covered.  Pt checks his sugars 1-3 times a day - better in last mo: : - am: 106-202 >> 127-180, 196, 200 >> 146-243 >> 113-173 - 2h after b'fast: n/c >> 150-189 >> 184 >> 189 >> n/c - before lunch:186 >> n/c >> 94, 111, 178 >> 163, 168 >> 137 - 2h after lunch:  n/c  >> 94-228 >> 116, 140 >> n/c - before dinner: 89-147, 172 >> 104-138, 233 >> 122, 150-249 >> 80, 151 - 2h after dinner: 89-193, 267 >> n/c >> 139 >> n/c  - bedtime:  143-276 >> 122-155, 228 >> 82, 93 >> n/c - nighttime: n/c >> 165 >> n/c Lowest sugar was 82 >> 122 >> 70; he has hypoglycemia awareness in the 70s.  Highest sugar was 228 >> 249 >> 200.  Glucometer: Precision >> One Touch Ultra mini  Pt's meals are: - Breakfast:  Oatmeal, wheat English muffin  - Lunch: salad, 1/2 sandwich - Dinner: meat + veggies, no bread - Snacks: 2x a day, fruit bar, fresh fruit  He was doing water exercises- 45 minutes 4 times a week. Now off since his ulcer.  -+ Mild CKD, last BUN/creatinine:  04/16/2017: 15/1.2 Lab Results  Component Value Date    BUN 16 11/14/2016   CREATININE 1.14 11/14/2016  07/13/2016: BUN/Cr 13/1.0, Glu 116, UACR 0.5 11/10/2015: EGFR 80, creatinine 1.1 04/15/2015: GFR 88.1, Cr 1.06 On lisinopril. -+ HL; last set of lipids: 07/13/2016: 179/288/44/77 Lab Results  Component Value Date   CHOL 179 07/13/2016   HDL 44 07/13/2016   LDLCALC 104 07/13/2016   LDLDIRECT 75.7 07/02/2012   TRIG 160.0 (H) 07/28/2015   CHOLHDL 5 07/28/2015  04/15/2015: LDL 89 On pravastatin. - last dilated eye exam was in 07/2016: No DR DR. he has non-tension glaucoma. - Denies numbness and tingling in his feet.  He was in a DM clinical trial before: "Jump Start" lifestyle Research Trial >> once a month >> counseling sessions - 1 year pgm.  He also has a history of HTN, ED. He has gout - on Alopurinol >> Uloric (b/c rash with Alopurinol).   ROS: Constitutional: no weight gain/no weight loss, no fatigue, no subjective hyperthermia, no subjective hypothermia Eyes: no blurry vision, no xerophthalmia ENT: no sore throat, no nodules palpated in throat, no dysphagia, no odynophagia, no hoarseness Cardiovascular: no CP/no SOB/no palpitations/no leg swelling Respiratory: no cough/no SOB/no wheezing Gastrointestinal: no N/no V/no D/no C/no acid reflux Musculoskeletal: no  muscle aches/no joint aches Skin: + rash legs, arms, no hair loss Neurological: no tremors/no numbness/no tingling/no dizziness  I reviewed pt's medications, allergies, PMH, social hx, family hx, and changes were documented in the history of present illness. Otherwise, unchanged from my initial visit note.   Past Medical History:  Diagnosis Date  . Allergic rhinitis   . Diabetes mellitus type II   . Diverticulosis of colon   . Glaucoma   . History of agent Orange exposure   . HLD (hyperlipidemia)   . HTN (hypertension)   . Hypertrophy of prostate without urinary obstruction and other lower urinary tract symptoms (LUTS)   . Osteoarthritis    Past Surgical  History:  Procedure Laterality Date  . TOOTH EXTRACTION       History   Social History  . Marital Status: Married    Spouse Name: N/A  . Number of Children: 2   Occupational History  . Retired-[employer]    Social History Main Topics  . Smoking status: Never Smoker   . Smokeless tobacco: Never Used  . Alcohol Use: No  . Drug Use: No    [place] vet- 20% disability from shrapnel wounds 1967 and 20% agent orange   Married (Widowed 1996; remarried 6/03)   Retired [employer]      Has living will   Wife, then daughter Hoyle Barr will be health care POA   Would want attempts at resuscitation   Would not want feeding tube if cognitively unaware   Current Outpatient Medications on File Prior to Visit  Medication Sig Dispense Refill  . albuterol (VENTOLIN HFA) 108 (90 BASE) MCG/ACT inhaler Inhale 2 puffs into the lungs every 4 (four) hours as needed.      Marland Kitchen aspirin 81 MG tablet Take 81 mg by mouth daily.      . brimonidine (ALPHAGAN) 0.2 % ophthalmic solution Place 1 drop into both eyes 2 (two) times daily.     . cetirizine (ZYRTEC) 10 MG tablet Take 10 mg by mouth daily.    . colchicine 0.6 MG tablet Take 0.6 mg by mouth as needed. Take 2 tablets by mouth at first sign of gout flare followed by 1 tablet after 1 hour. Next day 1 tab;et for 5 days    . COMBIGAN 0.2-0.5 % ophthalmic solution     . doxycycline (VIBRA-TABS) 100 MG tablet Take 1 tablet (100 mg total) by mouth 2 (two) times daily. 20 tablet 0  . fluocinonide cream (LIDEX) 0.05 % APPLY TWICE DAILY TO AFFECTED AREAS OF LEGS&ARMS UNTIL CLEAR,THEN AS NEEDED. AVOID FACE,GROIN,AXILLA  0  . fluticasone (FLONASE) 50 MCG/ACT nasal spray     . gabapentin (NEURONTIN) 300 MG capsule TAKE 2 CAPSULES (600 MG TOTAL) BY MOUTH 3 (THREE) TIMES DAILY. 180 capsule 4  . glipiZIDE (GLUCOTROL XL) 5 MG 24 hr tablet Take 2 tablets in am and 1 tablet before diner 270 tablet 3  . glucose blood (ONE TOUCH ULTRA TEST) test strip Use as instructed to check sugar 2  times daily 200 each 5  . indomethacin (INDOCIN) 50 MG capsule Take 1 capsule (50 mg total) by mouth 2 (two) times daily with a meal. 60 capsule 1  . ketoconazole (NIZORAL) 2 % cream     . latanoprost (XALATAN) 0.005 % ophthalmic solution Place 1 drop into both eyes at bedtime.     Marland Kitchen lisinopril (PRINIVIL,ZESTRIL) 20 MG tablet Take 10 mg by mouth daily.     . metFORMIN (GLUCOPHAGE) 1000 MG tablet Take  1,000 mg by mouth 2 (two) times daily with a meal.      . Misc Natural Products (GLUCOSAMINE CHONDROITIN ADV PO) Take 2 tablets by mouth 2 (two) times daily.    . Multiple Vitamin (MULTIVITAMIN) tablet Take 1 tablet by mouth daily.      . mupirocin ointment (BACTROBAN) 2 % Apply to wound twice a day. 30 g 2  . pravastatin (PRAVACHOL) 20 MG tablet Take 10 mg by mouth daily.     . sildenafil (REVATIO) 20 MG tablet TAKE 3-5 TABLETS BY MOUTH DAILY AS NEEDED. 50 tablet 11  . sildenafil (VIAGRA) 50 MG tablet     . triamcinolone ointment (KENALOG) 0.5 % Apply 1 application topically 2 (two) times daily. 30 g 0  . TRULICITY 1.5 LO/7.5IE SOPN INJECT ONE PEN (1.5 MG TOTAL) UNDER SKIN ON SUNDAYS 6 pen 1  . ULORIC 40 MG tablet Take 40 mg by mouth daily.  4  . Vitamin D, Cholecalciferol, 1000 UNITS CAPS Take 1 capsule by mouth daily.     No current facility-administered medications on file prior to visit.    No Known Allergies Family History  Problem Relation Age of Onset  . Colon cancer Father   . Stroke Mother   . Heart attack Mother   . Coronary artery disease Mother   . Hypertension Unknown        several siblings  . Diabetes Unknown        several siblings  . Coronary artery disease Brother   . ALS Sister    PE: BP 120/70   Pulse 85   Ht 5' 11"  (1.803 m)   Wt 228 lb (103.4 kg)   SpO2 97%   BMI 31.80 kg/m  Body mass index is 31.8 kg/m. Wt Readings from Last 3 Encounters:  05/10/17 228 lb (103.4 kg)  05/04/17 228 lb (103.4 kg)  04/23/17 227 lb (103 kg)   Constitutional: overweight,  in NAD Eyes: PERRLA, EOMI, no exophthalmos ENT: moist mucous membranes, no thyromegaly, no cervical lymphadenopathy Cardiovascular: RRR, No MRG Respiratory: CTA B Gastrointestinal: abdomen soft, NT, ND, BS+ Musculoskeletal: no deformities, strength intact in all 4, + R foot in boot Skin: moist, warm, no rashes Neurological: no tremor with outstretched hands, DTR normal in all 4  ASSESSMENT: 1. DM2, non-insulin-dependent, uncontrolled, with hyperglycemia  2. HL  PLAN:  1. Patient with long-standing, uncontrolled, type 2 diabetes, on oral antidiabetic regimen + Trulicity, with worse control at last visit, 5 months ago, due to multiple factors: Traveling, diet, steroids.  At that time, we moved all the metformin at night, and increased glipizide XL dose.  We also discussed at length about diet, and I suggested to eliminate fatty foods: Meats, cheese, eggs; not to eat after 7 PM, and include as many veggies as possible in his diet. - At this visit, sugars are better - still has some spikes in am but overall sugars are at or close to goal >> no change in regimen is needed for now - I suggested to:  Patient Instructions  Please continue: - Metformin 2000 mg with dinner - Trulicity 1.5 mg weekly. - Glipizide XL 10 mg in am and 5 mg before dinner  Please return in 3 months with your sugar log.   - today, HbA1c is 7.4% (better) - continue checking sugars at different times of the day - check 2x a day, rotating checks - advised for yearly eye exams >> he is UTD - Return to clinic in  3 mo with sugar log   2. HL -  reviewed latest lipid panel from 07/2016: He had elevated triglycerides  - At last visit, we discussed about changing his diet to eliminate fatty foods  - working on this -  he continues on pravastatin without side effects  Lucas Kingdom, MD PhD [facility] Endocrinology

## 2017-05-10 NOTE — Addendum Note (Signed)
Addended by: Drucilla Schmidt on: 05/10/2017 11:50 AM   Modules accepted: Orders

## 2017-05-10 NOTE — Patient Instructions (Addendum)
Please continue: - Metformin 2000 mg with dinner - Trulicity 1.5 mg weekly. - Glipizide XL 10 mg in am and 5 mg before dinner  Please return in 3 months with your sugar log.

## 2017-05-15 DIAGNOSIS — E119 Type 2 diabetes mellitus without complications: Secondary | ICD-10-CM | POA: Diagnosis not present

## 2017-05-15 DIAGNOSIS — H401232 Low-tension glaucoma, bilateral, moderate stage: Secondary | ICD-10-CM | POA: Diagnosis not present

## 2017-05-16 ENCOUNTER — Encounter: Payer: Self-pay | Admitting: Podiatry

## 2017-05-16 ENCOUNTER — Telehealth: Payer: Self-pay | Admitting: *Deleted

## 2017-05-16 ENCOUNTER — Ambulatory Visit (INDEPENDENT_AMBULATORY_CARE_PROVIDER_SITE_OTHER): Payer: Medicare Other | Admitting: Orthotics

## 2017-05-16 ENCOUNTER — Ambulatory Visit (INDEPENDENT_AMBULATORY_CARE_PROVIDER_SITE_OTHER): Payer: Medicare Other | Admitting: Podiatry

## 2017-05-16 DIAGNOSIS — I739 Peripheral vascular disease, unspecified: Secondary | ICD-10-CM | POA: Diagnosis not present

## 2017-05-16 DIAGNOSIS — M1A00X0 Idiopathic chronic gout, unspecified site, without tophus (tophi): Secondary | ICD-10-CM

## 2017-05-16 DIAGNOSIS — M109 Gout, unspecified: Secondary | ICD-10-CM

## 2017-05-16 DIAGNOSIS — E1140 Type 2 diabetes mellitus with diabetic neuropathy, unspecified: Secondary | ICD-10-CM

## 2017-05-16 DIAGNOSIS — L97515 Non-pressure chronic ulcer of other part of right foot with muscle involvement without evidence of necrosis: Secondary | ICD-10-CM | POA: Diagnosis not present

## 2017-05-16 DIAGNOSIS — E11621 Type 2 diabetes mellitus with foot ulcer: Secondary | ICD-10-CM

## 2017-05-16 DIAGNOSIS — M722 Plantar fascial fibromatosis: Secondary | ICD-10-CM

## 2017-05-16 NOTE — Telephone Encounter (Signed)
-----   Message from Rip Harbour, [facility] sent at 05/16/2017 11:43 AM EST ----- Regarding: Krystexxa Patient needs an appointment with a rheumatologist for consult-gout/Kyrtexxa therapy and Dr. Milinda Pointer asks if you would please start the paperwork for that medication.  Thanks!

## 2017-05-16 NOTE — Telephone Encounter (Signed)
[place] states send required form as is and once authorized fax referral and PA, with clinicals and demographics to [employer] [phone] or call [phone] to let her know the referral is on the way.

## 2017-05-16 NOTE — Progress Notes (Signed)
Mr. Cournoyer presents today for follow-up of the superficial ulceration medial aspect of the first metatarsal phalangeal joint right foot.  He is also here to pick up his diabetic shoes and he was also interested in the report from his vascular surgeon.  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  I see no fat.  It measures less than 3 mm in diameter.  Vascular report does demonstrate relatively normal ABIs however there is probably some microvascular disease in the area.  Assessment diabetic with a history of gout and tophaceous gout this resulting in an ulceration medial aspect of the right foot.  Plan: At this point due to the tophus not allowing this wound to heal we have 2 options we can either go on for an IV infusion of Krystexxa or we could consider surgical intervention.  At this point he would like to try for the IV infusion.  We are going to request that he follow-up with a rheumatologist in [place] for this.  I will follow-up with him in the near future should this be denied or not move forward fast enough.  We will then consider surgical cleanout.

## 2017-05-16 NOTE — Telephone Encounter (Signed)
Faxed required form, clinicals and demographics to [facility].

## 2017-05-16 NOTE — Telephone Encounter (Signed)
Call to [place] to call concerning pt and his PA.

## 2017-05-16 NOTE — Progress Notes (Signed)

## 2017-05-16 NOTE — Telephone Encounter (Signed)
Faxed required form, clinicals and demographics to [facility].

## 2017-05-18 ENCOUNTER — Telehealth: Payer: Self-pay | Admitting: *Deleted

## 2017-05-18 DIAGNOSIS — M1A00X0 Idiopathic chronic gout, unspecified site, without tophus (tophi): Secondary | ICD-10-CM

## 2017-05-18 NOTE — Telephone Encounter (Signed)
-----   Message from [place] sent at 05/17/2017  4:36 PM EST ----- Regarding: RE: Lucas [name] Yes, they need to be established with a rheumatologist first to get the Krystexxa infusions.  Thanks!  ----- Message ----- From: Lucas Ege, RN Sent: 05/16/2017   2:32 PM To: Lucas [name], [facility] Subject: RE: Lucas [name], ask Lucas [name] if he is wanting a referral to Lucas [name] at [facility] Rheumatology prior to receiving the PA for the [facility]? Thank you, Lucas [name] ----- Message ----- From: Lucas [name], [facility] Sent: 05/16/2017  11:43 AM To: Lucas Ege, RN Subject: Lucas [name]                                      Patient needs an appointment with a rheumatologist for consult-gout/Kyrtexxa therapy and Lucas [name] asks if you would please start the paperwork for that medication.  Thanks!

## 2017-05-18 NOTE — Telephone Encounter (Signed)
Faxed referral with note to [facility], stating pt was a potential candidate for Krystexxa and the paper work had been started, clinical and demographics.

## 2017-05-24 ENCOUNTER — Telehealth: Payer: Self-pay | Admitting: *Deleted

## 2017-05-24 DIAGNOSIS — M1990 Unspecified osteoarthritis, unspecified site: Secondary | ICD-10-CM | POA: Diagnosis not present

## 2017-05-24 DIAGNOSIS — E119 Type 2 diabetes mellitus without complications: Secondary | ICD-10-CM | POA: Diagnosis not present

## 2017-05-24 DIAGNOSIS — L97509 Non-pressure chronic ulcer of other part of unspecified foot with unspecified severity: Secondary | ICD-10-CM | POA: Diagnosis not present

## 2017-05-24 DIAGNOSIS — M1A9XX1 Chronic gout, unspecified, with tophus (tophi): Secondary | ICD-10-CM | POA: Diagnosis not present

## 2017-05-24 DIAGNOSIS — M79673 Pain in unspecified foot: Secondary | ICD-10-CM | POA: Diagnosis not present

## 2017-05-24 NOTE — Telephone Encounter (Signed)
Levada Dy request copy of labs and imaging reports.

## 2017-05-24 NOTE — Telephone Encounter (Signed)
Faxed copy of 11/14/2016 labs Dr. Cruzita Lederer and 06/28/2015 Dr. Milinda Pointer to [facility].

## 2017-06-04 DIAGNOSIS — E119 Type 2 diabetes mellitus without complications: Secondary | ICD-10-CM | POA: Diagnosis not present

## 2017-06-04 DIAGNOSIS — M1A9XX1 Chronic gout, unspecified, with tophus (tophi): Secondary | ICD-10-CM | POA: Diagnosis not present

## 2017-06-04 DIAGNOSIS — M1990 Unspecified osteoarthritis, unspecified site: Secondary | ICD-10-CM | POA: Diagnosis not present

## 2017-06-08 ENCOUNTER — Other Ambulatory Visit: Payer: Self-pay | Admitting: Internal Medicine

## 2017-06-18 ENCOUNTER — Other Ambulatory Visit: Payer: Self-pay | Admitting: Internal Medicine

## 2017-06-19 ENCOUNTER — Other Ambulatory Visit: Payer: Self-pay | Admitting: Internal Medicine

## 2017-06-19 DIAGNOSIS — E119 Type 2 diabetes mellitus without complications: Secondary | ICD-10-CM | POA: Diagnosis not present

## 2017-06-19 DIAGNOSIS — H401232 Low-tension glaucoma, bilateral, moderate stage: Secondary | ICD-10-CM | POA: Diagnosis not present

## 2017-07-09 DIAGNOSIS — M1A9XX1 Chronic gout, unspecified, with tophus (tophi): Secondary | ICD-10-CM | POA: Diagnosis not present

## 2017-07-23 ENCOUNTER — Other Ambulatory Visit: Payer: Self-pay | Admitting: Internal Medicine

## 2017-07-23 DIAGNOSIS — M1A9XX1 Chronic gout, unspecified, with tophus (tophi): Secondary | ICD-10-CM | POA: Diagnosis not present

## 2017-07-24 ENCOUNTER — Other Ambulatory Visit: Payer: Self-pay

## 2017-07-24 MED ORDER — DULAGLUTIDE 1.5 MG/0.5ML ~~LOC~~ SOAJ: SUBCUTANEOUS | 3 refills | Status: DC

## 2017-08-01 ENCOUNTER — Other Ambulatory Visit: Payer: Self-pay

## 2017-08-01 MED ORDER — DULAGLUTIDE 1.5 MG/0.5ML ~~LOC~~ SOAJ: SUBCUTANEOUS | 3 refills | Status: DC

## 2017-08-06 ENCOUNTER — Encounter: Payer: Self-pay | Admitting: Internal Medicine

## 2017-08-06 ENCOUNTER — Ambulatory Visit (INDEPENDENT_AMBULATORY_CARE_PROVIDER_SITE_OTHER): Payer: Medicare Other | Admitting: Internal Medicine

## 2017-08-06 VITALS — BP 124/70 | HR 74 | Ht 71.0 in | Wt 230.2 lb

## 2017-08-06 DIAGNOSIS — E785 Hyperlipidemia, unspecified: Secondary | ICD-10-CM | POA: Diagnosis not present

## 2017-08-06 DIAGNOSIS — E1140 Type 2 diabetes mellitus with diabetic neuropathy, unspecified: Secondary | ICD-10-CM | POA: Diagnosis not present

## 2017-08-06 DIAGNOSIS — M1A9XX1 Chronic gout, unspecified, with tophus (tophi): Secondary | ICD-10-CM | POA: Diagnosis not present

## 2017-08-06 LAB — POCT GLYCOSYLATED HEMOGLOBIN (HGB A1C): Hemoglobin A1C: 7.8

## 2017-08-06 NOTE — Patient Instructions (Signed)
Please continue: - Metformin 2000 mg with dinner - Trulicity 1.5 mg weekly. - Glipizide XL 10 mg in am and 5 mg before dinner  Please start changing your diet as discussed.  Please return in 3-4 months with your sugar log.

## 2017-08-06 NOTE — Progress Notes (Signed)
Patient ID: [name], male   DOB: [birth year], 77 y.o.   MRN: [id number]  HPI: Lucas [name] is a 77 y.o.-year-old male, returning for f/u for DM2, dx in 1998, non-insulin-dependent (previous Agent Orange exposure in [place]), uncontrolled, with complications (mild CKD, foot ulcer). Last visit 3 mo ago.  He has a painful gout tofus in R foot.   Last hemoglobin A1c was: Lab Results  Component Value Date   HGBA1C 7.4 05/10/2017   HGBA1C 8.3 11/29/2016   HGBA1C 7.7 05/08/2016  07/13/2016: HbA1c 8.0% 04/15/2015: 7.6%  Pt is on a regimen of: - Metformin 1000 mg 2x a day, with meals >> 2000 mg with dinner - Glipizide XL 5 mg in am and 5 mg XL tablet before dinner  >> 10 mg in a.m. and 5 mg in pm - Trulicity 6.73 mg weekly - started 02/2016 >> 1.5 mg weekly He had to stop [place] as K increased (took 1 mo, off for 1.5 mo). Invokana was not covered.  Pt checks his sugars 1-2x a day: - am:  146-243 >> 113-173 >> 117-188, 211 - 2h after b'fast: 184 >> 189 >> n/c >> 312 - before lunch:163, 168 >> 137 >> 122 - 2h after lunch: 116, 140 >> n/c - before dinner: 122, 150-249 >> 80, 151 >> n/c >> 177 - 2h after dinner:n/c >> 139 >> n/c  - bedtime:  122-155, 228 >> 82, 93 >> n/c >> 87 - nighttime: n/c >> 165 >> n/c Lowest sugar was 70 >> 117; he has hypoglycemia awareness in the 70s Highest sugar was 200 >> 312.  Glucometer: Precision >> One Touch Ultra mini  Pt's meals are: - Breakfast:  Oatmeal, wheat English muffin  - Lunch: salad, 1/2 sandwich - Dinner: meat + veggies, no bread - Snacks: 2x a day, fruit bar, fresh fruit  He was doing water exercises- 45 minutes 4 times a week. Not recently.  -+ mild CKD, last BUN/creatinine:  04/16/2017: 15/1.2 Lab Results  Component Value Date   BUN 16 11/14/2016   CREATININE 1.14 11/14/2016  07/13/2016: BUN/Cr 13/1.0, Glu 116, UACR 0.5 11/10/2015: EGFR 80, creatinine 1.1 04/15/2015: GFR 88.1, Cr 1.06 On lisinopril. -+ HL; last set of  lipids: 04/16/2016: 179/288/44/77 Lab Results  Component Value Date   CHOL 179 07/13/2016   HDL 44 07/13/2016   LDLCALC 104 07/13/2016   LDLDIRECT 75.7 07/02/2012   TRIG 160.0 (H) 07/28/2015   CHOLHDL 5 07/28/2015  04/15/2015: LDL 89 On pravastatin. - last dilated eye exam was in 07/2017: ? DR. he has non-tension glaucoma. - no numbness and tingling in his feet.  He was in a DM clinical trial before: "Jump Start" lifestyle Research Trial.  He also has a history of HTN, ED. He has gout - on Alopurinol >> Uloric (b/c rash with Alopurinol).   He had a R foot ulcer 04/2017. He is seen by a vascular dr. Lynnell Dike normal.  ROS: Constitutional: no weight gain/no weight loss, no fatigue, no subjective hyperthermia, no subjective hypothermia Eyes: no blurry vision, no xerophthalmia ENT: no sore throat, no nodules palpated in throat, no dysphagia, no odynophagia, no hoarseness Cardiovascular: no CP/no SOB/no palpitations/no leg swelling Respiratory: no cough/no SOB/no wheezing Gastrointestinal: no N/no V/no D/no C/no acid reflux Musculoskeletal: no muscle aches/no joint aches Skin: no rashes, no hair loss Neurological: no tremors/no numbness/no tingling/no dizziness  I reviewed pt's medications, allergies, PMH, social hx, family hx, and changes were documented in the history of present illness. Otherwise,  unchanged from my initial visit note. Started Krysexxa.  Past Medical History:  Diagnosis Date  . Allergic rhinitis   . Diabetes mellitus type II   . Diverticulosis of colon   . Glaucoma   . History of agent Orange exposure   . HLD (hyperlipidemia)   . HTN (hypertension)   . Hypertrophy of prostate without urinary obstruction and other lower urinary tract symptoms (LUTS)   . Osteoarthritis    Past Surgical History:  Procedure Laterality Date  . TOOTH EXTRACTION       History   Social History  . Marital Status: Married    Spouse Name: N/A  . Number of Children: 2    Occupational History  . Retired-[employer]    Social History Main Topics  . Smoking status: Never Smoker   . Smokeless tobacco: Never Used  . Alcohol Use: No  . Drug Use: No    [place] vet- 20% disability from shrapnel wounds 1967 and 20% agent orange   Married (Widowed 1996; remarried 6/03)   Retired [employer]      Has living will   Wife, then daughter Lucas [name] will be health care POA   Would want attempts at resuscitation   Would not want feeding tube if cognitively unaware   Current Outpatient Medications on File Prior to Visit  Medication Sig Dispense Refill  . albuterol (VENTOLIN HFA) 108 (90 BASE) MCG/ACT inhaler Inhale 2 puffs into the lungs every 4 (four) hours as needed.      Marland Kitchen aspirin 81 MG tablet Take 81 mg by mouth daily.      . brimonidine (ALPHAGAN) 0.2 % ophthalmic solution Place 1 drop into both eyes 2 (two) times daily.     . cetirizine (ZYRTEC) 10 MG tablet Take 10 mg by mouth daily.    . colchicine 0.6 MG tablet Take 0.6 mg by mouth as needed. Take 2 tablets by mouth at first sign of gout flare followed by 1 tablet after 1 hour. Next day 1 tab;et for 5 days    . COMBIGAN 0.2-0.5 % ophthalmic solution     . doxycycline (VIBRA-TABS) 100 MG tablet Take 1 tablet (100 mg total) by mouth 2 (two) times daily. 20 tablet 0  . Dulaglutide (TRULICITY) 1.5 IH/4.7QQ SOPN INJECT ONE PEN (1.5 MG TOTAL) UNDER SKIN ON SUNDAYS 4 pen 3  . fluocinonide cream (LIDEX) 0.05 % APPLY TWICE DAILY TO AFFECTED AREAS OF LEGS&ARMS UNTIL CLEAR,THEN AS NEEDED. AVOID FACE,GROIN,AXILLA  0  . fluticasone (FLONASE) 50 MCG/ACT nasal spray     . gabapentin (NEURONTIN) 300 MG capsule TAKE 2 CAPSULES (600 MG TOTAL) BY MOUTH 3 (THREE) TIMES DAILY. 180 capsule 2  . glipiZIDE (GLUCOTROL XL) 5 MG 24 hr tablet Take 2 tablets in am and 1 tablet before diner 270 tablet 3  . glucose blood (ONE TOUCH ULTRA TEST) test strip Use as instructed to check sugar 2 times daily 200 each 5  . indomethacin (INDOCIN) 50 MG capsule  Take 1 capsule (50 mg total) by mouth 2 (two) times daily with a meal. 60 capsule 1  . ketoconazole (NIZORAL) 2 % cream     . latanoprost (XALATAN) 0.005 % ophthalmic solution Place 1 drop into both eyes at bedtime.     Marland Kitchen lisinopril (PRINIVIL,ZESTRIL) 20 MG tablet Take 10 mg by mouth daily.     . metFORMIN (GLUCOPHAGE) 1000 MG tablet Take 1,000 mg by mouth 2 (two) times daily with a meal.      . Misc Natural  Products (GLUCOSAMINE CHONDROITIN ADV PO) Take 2 tablets by mouth 2 (two) times daily.    . Multiple Vitamin (MULTIVITAMIN) tablet Take 1 tablet by mouth daily.      . mupirocin ointment (BACTROBAN) 2 % Apply to wound twice a day. 30 g 2  . pravastatin (PRAVACHOL) 20 MG tablet Take 10 mg by mouth daily.     . sildenafil (REVATIO) 20 MG tablet TAKE 3 TO 5 TABLETS BY MOUTH ONCE DAILY AS NEEDED 50 tablet 11  . sildenafil (VIAGRA) 50 MG tablet     . triamcinolone ointment (KENALOG) 0.5 % Apply 1 application topically 2 (two) times daily. 30 g 0  . TRULICITY 1.5 GY/1.7CB SOPN INJECT ONE PEN (1.5 MG TOTAL) UNDER SKIN ON SUNDAYS 6 pen 1  . ULORIC 40 MG tablet Take 40 mg by mouth daily.  4  . Vitamin D, Cholecalciferol, 1000 UNITS CAPS Take 1 capsule by mouth daily.     No current facility-administered medications on file prior to visit.    No Known Allergies Family History  Problem Relation Age of Onset  . Colon cancer Father   . Stroke Mother   . Heart attack Mother   . Coronary artery disease Mother   . Hypertension Unknown        several siblings  . Diabetes Unknown        several siblings  . Coronary artery disease Brother   . ALS Sister    PE: BP 124/70   Pulse 74   Ht 5' 11"  (1.803 m)   Wt 230 lb 3.2 oz (104.4 kg)   SpO2 98%   BMI 32.11 kg/m  Body mass index is 32.11 kg/m. Wt Readings from Last 3 Encounters:  08/06/17 230 lb 3.2 oz (104.4 kg)  05/10/17 228 lb (103.4 kg)  05/04/17 228 lb (103.4 kg)   Constitutional: overweight, in NAD Eyes: PERRLA, EOMI, no  exophthalmos ENT: moist mucous membranes, no thyromegaly, no cervical lymphadenopathy Cardiovascular: RRR, No MRG Respiratory: CTA B Gastrointestinal: abdomen soft, NT, ND, BS+ Musculoskeletal: no deformities, strength intact in all 4 Skin: moist, warm, no rashes Neurological: no tremor with outstretched hands, DTR normal in all 4  ASSESSMENT: 1. DM2, non-insulin-dependent, uncontrolled, with complications - R foot ulcer. Nl ABIs - mild CKD  2. HL  PLAN:  1. Patient with long-standing, uncontrolled, type 2 diabetes, on oral antidiabetic regimen, and Trulicity, which sugars better at last visit after we moved all metformin at night, increase glipizide XL dose and discussed about improving diet.  We did not change the regimen at last visit as sugars overall were at or close to goal, with only few hyperglycemic spikes. - At this visit, sugars are higher as he could not be very active due to his gout.  However, this has improved and he plans to start exercise.  He also admits that he snacks too much and he would like to cut down on his snacks also.  He would like to stay on the same regimen for now to work on the above issues. - I suggested to:  Patient Instructions  Please continue: - Metformin 2000 mg with dinner - Trulicity 1.5 mg weekly. - Glipizide XL 10 mg in am and 5 mg before dinner  Please start changing your diet as discussed.  Please return in 3-4 months with your sugar log.   - today, HbA1c is 7.8% (higher) - continue checking sugars at different times of the day - check 1x a day, rotating checks -  advised for yearly eye exams >> he is UTD - Return to clinic in 3-4 mo with sugar log   2. HL - Reviewed latest lipid panel from 07/2016: LDL higher, TG also slightly high - continues to work on his diet - Continues pravastatin without side effects  Philemon Kingdom, MD PhD [facility] Endocrinology

## 2017-08-20 ENCOUNTER — Ambulatory Visit (INDEPENDENT_AMBULATORY_CARE_PROVIDER_SITE_OTHER): Payer: Medicare Other | Admitting: Internal Medicine

## 2017-08-20 ENCOUNTER — Encounter: Payer: Self-pay | Admitting: Internal Medicine

## 2017-08-20 VITALS — BP 106/66 | HR 79 | Temp 97.9°F | Ht 71.0 in | Wt 230.0 lb

## 2017-08-20 DIAGNOSIS — Z0000 Encounter for general adult medical examination without abnormal findings: Secondary | ICD-10-CM

## 2017-08-20 DIAGNOSIS — G4733 Obstructive sleep apnea (adult) (pediatric): Secondary | ICD-10-CM | POA: Diagnosis not present

## 2017-08-20 DIAGNOSIS — L97519 Non-pressure chronic ulcer of other part of right foot with unspecified severity: Secondary | ICD-10-CM | POA: Diagnosis not present

## 2017-08-20 DIAGNOSIS — N138 Other obstructive and reflux uropathy: Secondary | ICD-10-CM

## 2017-08-20 DIAGNOSIS — N401 Enlarged prostate with lower urinary tract symptoms: Secondary | ICD-10-CM

## 2017-08-20 DIAGNOSIS — M1A9XX1 Chronic gout, unspecified, with tophus (tophi): Secondary | ICD-10-CM | POA: Diagnosis not present

## 2017-08-20 DIAGNOSIS — E1140 Type 2 diabetes mellitus with diabetic neuropathy, unspecified: Secondary | ICD-10-CM | POA: Diagnosis not present

## 2017-08-20 DIAGNOSIS — Z7189 Other specified counseling: Secondary | ICD-10-CM

## 2017-08-20 DIAGNOSIS — I10 Essential (primary) hypertension: Secondary | ICD-10-CM

## 2017-08-20 LAB — HM DIABETES FOOT EXAM

## 2017-08-20 LAB — COMPREHENSIVE METABOLIC PANEL
ALT: 27 U/L (ref 0–53)
AST: 30 U/L (ref 0–37)
Albumin: 4.1 g/dL (ref 3.5–5.2)
Alkaline Phosphatase: 31 U/L — ABNORMAL LOW (ref 39–117)
BUN: 15 mg/dL (ref 6–23)
CO2: 31 mEq/L (ref 19–32)
Calcium: 9.5 mg/dL (ref 8.4–10.5)
Chloride: 102 mEq/L (ref 96–112)
Creatinine, Ser: 0.99 mg/dL (ref 0.40–1.50)
GFR: 94.62 mL/min (ref >60.00)
Glucose, Bld: 184 mg/dL — ABNORMAL HIGH (ref 70–99)
Potassium: 4.6 mEq/L (ref 3.5–5.1)
Sodium: 137 mEq/L (ref 135–145)
Total Bilirubin: 0.3 mg/dL (ref 0.2–1.2)
Total Protein: 6.9 g/dL (ref 6.0–8.3)

## 2017-08-20 LAB — LIPID PANEL
Cholesterol: 175 mg/dL (ref 0–200)
HDL: 37.2 mg/dL — ABNORMAL LOW (ref >39.00)
NonHDL: 137.38
Total CHOL/HDL Ratio: 5
Triglycerides: 329 mg/dL — ABNORMAL HIGH (ref 0.0–149.0)
VLDL: 65.8 mg/dL — ABNORMAL HIGH (ref 0.0–40.0)

## 2017-08-20 LAB — CBC
HCT: 39.4 % (ref 39.0–52.0)
Hemoglobin: 13 g/dL (ref 13.0–17.0)
MCHC: 33 g/dL (ref 30.0–36.0)
MCV: 85.7 fl (ref 78.0–100.0)
Platelets: 221 10*3/uL (ref 150.0–400.0)
RBC: 4.6 Mil/uL (ref 4.22–5.81)
RDW: 14.9 % (ref 11.5–15.5)
WBC: 4.1 10*3/uL (ref 4.0–10.5)

## 2017-08-20 LAB — LDL CHOLESTEROL, DIRECT: Direct LDL: 96 mg/dL

## 2017-08-20 NOTE — Progress Notes (Signed)
Hearing Screening Comments: Has a hearing aid. Not wearing it today Vision Screening Comments: April 2019

## 2017-08-20 NOTE — Assessment & Plan Note (Signed)
I have personally reviewed the Medicare Annual Wellness questionnaire and have noted 1. The patient's medical and social history 2. Their use of alcohol, tobacco or illicit drugs 3. Their current medications and supplements 4. The patient's functional ability including ADL's, fall risks, home safety risks and hearing or visual             impairment. 5. Diet and physical activities 6. Evidence for depression or mood disorders  The patients weight, height, BMI and visual acuity have been recorded in the chart I have made referrals, counseling and provided education to the patient based review of the above and I have provided the pt with a written personalized care plan for preventive services.  I have provided you with a copy of your personalized plan for preventive services. Please take the time to review along with your updated medication list.  Yearly flu vaccine Done with colonoscopies No PSA due to age Discussed exercise

## 2017-08-20 NOTE — Assessment & Plan Note (Signed)
Does well with the CPAP 

## 2017-08-20 NOTE — Assessment & Plan Note (Signed)
BP Readings from Last 3 Encounters:  08/20/17 106/66  08/06/17 124/70  05/10/17 120/70   Good control Will check labs

## 2017-08-20 NOTE — Assessment & Plan Note (Signed)
See social history 

## 2017-08-20 NOTE — Assessment & Plan Note (Signed)
Mild symptoms 

## 2017-08-20 NOTE — Assessment & Plan Note (Signed)
Improved From tophus Continues Rx for gout

## 2017-08-20 NOTE — Progress Notes (Signed)
Subjective:    Patient ID: Lucas [name], male    DOB: 16-Aug-1940, 77 y.o.   MRN: [id number]  HPI Here for Medicare wellness visit and follow up of chronic health conditions Reviewed form and advanced directives Reviewed other doctors No alcohol or tobacco Now doing more regular exercise--but limited due to toe problem Vision down in left eye Hearing aides No falls No depression or anhedonia  Independent with instrumental ADLs No sig memory problems  Has lost some vision in the left eye Continues with the eye doctor--unclear etiology Unable to get MRI due to shrapnel in neck Also found shrapnel in left hip --when x-rayed due to pain Did get CT scan--no evidence of stroke Going back to [place] for further evaluation  Referred to Dr Meda Coffee for ongoing gout Got shots in toe for this Still with non healing tophus---has gotten krystexxa injections and uric acid is very low now Vascular evaluation was fine  Recent visit with Dr Cruzita Lederer A1c down to 7.4% Numbness in feet is stable--no pain other than the gout No clinical hypoglycemia  Urine flow is okay if he drinks a lot Some dribbling at times Nocturia only in early morning  Sleeps with CPAP--uses nightly when at home Seems to help  No chest pain No SOB No palpitations No edema No dizziness or syncope Current Outpatient Medications on File Prior to Visit  Medication Sig Dispense Refill  . albuterol (VENTOLIN HFA) 108 (90 BASE) MCG/ACT inhaler Inhale 2 puffs into the lungs every 4 (four) hours as needed.      Marland Kitchen aspirin 81 MG tablet Take 81 mg by mouth daily.      . brimonidine (ALPHAGAN) 0.2 % ophthalmic solution Place 1 drop into both eyes 2 (two) times daily.     . cetirizine (ZYRTEC) 10 MG tablet Take 10 mg by mouth daily.    . colchicine 0.6 MG tablet Take 0.6 mg by mouth as needed. Take 2 tablets by mouth at first sign of gout flare followed by 1 tablet after 1 hour. Next day 1 tab;et for 5 days    . COMBIGAN 0.2-0.5 %  ophthalmic solution     . Dulaglutide (TRULICITY) 1.5 BJ/6.2GB SOPN INJECT ONE PEN (1.5 MG TOTAL) UNDER SKIN ON SUNDAYS 4 pen 3  . fluocinonide cream (LIDEX) 0.05 % APPLY TWICE DAILY TO AFFECTED AREAS OF LEGS&ARMS UNTIL CLEAR,THEN AS NEEDED. AVOID FACE,GROIN,AXILLA  0  . fluticasone (FLONASE) 50 MCG/ACT nasal spray     . gabapentin (NEURONTIN) 300 MG capsule TAKE 2 CAPSULES (600 MG TOTAL) BY MOUTH 3 (THREE) TIMES DAILY. 180 capsule 2  . glipiZIDE (GLUCOTROL XL) 5 MG 24 hr tablet Take 2 tablets in am and 1 tablet before diner 270 tablet 3  . glucose blood (ONE TOUCH ULTRA TEST) test strip Use as instructed to check sugar 2 times daily 200 each 5  . indomethacin (INDOCIN) 50 MG capsule Take 1 capsule (50 mg total) by mouth 2 (two) times daily with a meal. 60 capsule 1  . ketoconazole (NIZORAL) 2 % cream     . latanoprost (XALATAN) 0.005 % ophthalmic solution Place 1 drop into both eyes at bedtime.     Marland Kitchen lisinopril (PRINIVIL,ZESTRIL) 20 MG tablet Take 10 mg by mouth daily.     . metFORMIN (GLUCOPHAGE) 1000 MG tablet Take 1,000 mg by mouth 2 (two) times daily with a meal.      . Misc Natural Products (GLUCOSAMINE CHONDROITIN ADV PO) Take 2 tablets by mouth 2 (  two) times daily.    . Multiple Vitamin (MULTIVITAMIN) tablet Take 1 tablet by mouth daily.      . mupirocin ointment (BACTROBAN) 2 % Apply to wound twice a day. 30 g 2  . pegloticase (KRYSTEXXA) 8 MG/ML injection Inject into the vein every 14 (fourteen) days.    . pravastatin (PRAVACHOL) 20 MG tablet Take 10 mg by mouth daily.     . sildenafil (REVATIO) 20 MG tablet TAKE 3 TO 5 TABLETS BY MOUTH ONCE DAILY AS NEEDED 50 tablet 11  . sildenafil (VIAGRA) 50 MG tablet     . triamcinolone ointment (KENALOG) 0.5 % Apply 1 application topically 2 (two) times daily. 30 g 0  . Vitamin D, Cholecalciferol, 1000 UNITS CAPS Take 1 capsule by mouth daily.    Marland Kitchen ULORIC 40 MG tablet Take 40 mg by mouth daily.  4   No current facility-administered medications  on file prior to visit.     No Known Allergies  Past Medical History:  Diagnosis Date  . Allergic rhinitis   . Diabetes mellitus type II   . Diverticulosis of colon   . Glaucoma   . History of agent Orange exposure   . HLD (hyperlipidemia)   . HTN (hypertension)   . Hypertrophy of prostate without urinary obstruction and other lower urinary tract symptoms (LUTS)   . Osteoarthritis     Past Surgical History:  Procedure Laterality Date  . TOOTH EXTRACTION      Family History  Problem Relation Age of Onset  . Colon cancer Father   . Stroke Mother   . Heart attack Mother   . Coronary artery disease Mother   . Hypertension Unknown        several siblings  . Diabetes Unknown        several siblings  . Coronary artery disease Brother   . ALS Sister     Social History   Socioeconomic History  . Marital status: Married    Spouse name: Not on file  . Number of children: Not on file  . Years of education: Not on file  . Highest education level: Not on file  Occupational History  . Occupation: Retired-[employer]  Social Needs  . Financial resource strain: Not on file  . Food insecurity:    Worry: Not on file    Inability: Not on file  . Transportation needs:    Medical: Not on file    Non-medical: Not on file  Tobacco Use  . Smoking status: Never Smoker  . Smokeless tobacco: Never Used  Substance and Sexual Activity  . Alcohol use: No    Alcohol/week: 0.0 oz  . Drug use: No  . Sexual activity: Not on file  Lifestyle  . Physical activity:    Days per week: Not on file    Minutes per session: Not on file  . Stress: Not on file  Relationships  . Social connections:    Talks on phone: Not on file    Gets together: Not on file    Attends religious service: Not on file    Active member of club or organization: Not on file    Attends meetings of clubs or organizations: Not on file    Relationship status: Not on file  . Intimate partner violence:    Fear of current  or ex partner: Not on file    Emotionally abused: Not on file    Physically abused: Not on file    Forced sexual  activity: Not on file  Other Topics Concern  . Not on file  Social History Narrative   [place] vet- 20% disability from shrapnel wounds 1967 and 20% agent orange   Married (Widowed 1996; remarried 6/03)   Retired [employer] living will   Wife, then daughter Hoyle Barr will be health care POA   Would want attempts at resuscitation   Would not want feeding tube if cognitively unaware   Review of Systems Appetite is not great--but eats Weight is stable Some trouble with eczema---follows with Dr Nehemiah Massed Wears seat belt Recent dental extraction---keeps up with dentist Bowels are slow on the trulicity--uses miralax at times     Objective:   Physical Exam  Constitutional: He is oriented to person, place, and time. He appears well-developed. No distress.  HENT:  Mouth/Throat: Oropharynx is clear and moist. No oropharyngeal exudate.  Neck: No thyromegaly present.  Cardiovascular: Normal rate, regular rhythm, normal heart sounds and intact distal pulses. Exam reveals no gallop.  No murmur heard. Pulmonary/Chest: Effort normal and breath sounds normal. No respiratory distress. He has no wheezes. He has no rales.  Abdominal: Soft. There is no tenderness.  Musculoskeletal: He exhibits no edema or tenderness.  Lymphadenopathy:    He has no cervical adenopathy.  Neurological: He is alert and oriented to person, place, and time.  President--- "[name], [name]" [phone] [id number] Recall 2/3  Mildly decreased sensation in feet  Skin: No rash noted.  ~2-30mm non inflamed ulcer along lateral right great toe at MTP  Psychiatric: He has a normal mood and affect. His behavior is normal.          Assessment & Plan:

## 2017-08-20 NOTE — Assessment & Plan Note (Signed)
Control is better No sig pain on the gabapentin

## 2017-08-27 ENCOUNTER — Other Ambulatory Visit: Payer: Self-pay | Admitting: Internal Medicine

## 2017-09-10 DIAGNOSIS — M1A9XX1 Chronic gout, unspecified, with tophus (tophi): Secondary | ICD-10-CM | POA: Diagnosis not present

## 2017-09-11 DIAGNOSIS — M1A9XX1 Chronic gout, unspecified, with tophus (tophi): Secondary | ICD-10-CM | POA: Diagnosis not present

## 2017-09-17 LAB — HM DIABETES EYE EXAM

## 2017-09-18 DIAGNOSIS — E119 Type 2 diabetes mellitus without complications: Secondary | ICD-10-CM | POA: Diagnosis not present

## 2017-09-18 DIAGNOSIS — H401232 Low-tension glaucoma, bilateral, moderate stage: Secondary | ICD-10-CM | POA: Diagnosis not present

## 2017-09-20 ENCOUNTER — Encounter: Payer: Self-pay | Admitting: Internal Medicine

## 2017-09-25 DIAGNOSIS — M1A9XX1 Chronic gout, unspecified, with tophus (tophi): Secondary | ICD-10-CM | POA: Diagnosis not present

## 2017-10-04 DIAGNOSIS — E119 Type 2 diabetes mellitus without complications: Secondary | ICD-10-CM | POA: Diagnosis not present

## 2017-10-04 DIAGNOSIS — L97509 Non-pressure chronic ulcer of other part of unspecified foot with unspecified severity: Secondary | ICD-10-CM | POA: Diagnosis not present

## 2017-10-04 DIAGNOSIS — M1990 Unspecified osteoarthritis, unspecified site: Secondary | ICD-10-CM | POA: Diagnosis not present

## 2017-10-04 DIAGNOSIS — M2011 Hallux valgus (acquired), right foot: Secondary | ICD-10-CM | POA: Diagnosis not present

## 2017-10-04 DIAGNOSIS — M1A9XX1 Chronic gout, unspecified, with tophus (tophi): Secondary | ICD-10-CM | POA: Diagnosis not present

## 2017-10-09 ENCOUNTER — Telehealth: Payer: Self-pay | Admitting: Podiatry

## 2017-10-09 DIAGNOSIS — Z8739 Personal history of other diseases of the musculoskeletal system and connective tissue: Secondary | ICD-10-CM | POA: Diagnosis not present

## 2017-10-09 DIAGNOSIS — L97513 Non-pressure chronic ulcer of other part of right foot with necrosis of muscle: Secondary | ICD-10-CM | POA: Diagnosis not present

## 2017-10-09 DIAGNOSIS — E11621 Type 2 diabetes mellitus with foot ulcer: Secondary | ICD-10-CM | POA: Diagnosis not present

## 2017-10-09 DIAGNOSIS — E1140 Type 2 diabetes mellitus with diabetic neuropathy, unspecified: Secondary | ICD-10-CM | POA: Diagnosis not present

## 2017-10-10 ENCOUNTER — Other Ambulatory Visit: Payer: Self-pay | Admitting: Podiatry

## 2017-10-10 ENCOUNTER — Ambulatory Visit (INDEPENDENT_AMBULATORY_CARE_PROVIDER_SITE_OTHER): Payer: Medicare Other

## 2017-10-10 ENCOUNTER — Encounter: Payer: Self-pay | Admitting: Podiatry

## 2017-10-10 ENCOUNTER — Ambulatory Visit (INDEPENDENT_AMBULATORY_CARE_PROVIDER_SITE_OTHER): Payer: Medicare Other | Admitting: Podiatry

## 2017-10-10 DIAGNOSIS — M779 Enthesopathy, unspecified: Principal | ICD-10-CM

## 2017-10-10 DIAGNOSIS — L97511 Non-pressure chronic ulcer of other part of right foot limited to breakdown of skin: Secondary | ICD-10-CM | POA: Diagnosis not present

## 2017-10-10 DIAGNOSIS — L97512 Non-pressure chronic ulcer of other part of right foot with fat layer exposed: Secondary | ICD-10-CM

## 2017-10-10 DIAGNOSIS — M778 Other enthesopathies, not elsewhere classified: Secondary | ICD-10-CM

## 2017-10-10 NOTE — Progress Notes (Signed)
Patient presents today with referral from wound care center to rule out osteomyelitis.  Objective: Chronic chronic ulcerative lesion medial aspect of the first metatarsal phalangeal joint is present.  Radiographs taken today do not demonstrate any type of gas in the tissues nor does it demonstrate any type of obvious bone breakdown.  Assessment chronic ulceration first metatarsal phalangeal joint right.  Plan: Wrote a letter to his nurse practitioner at the wound care facility recommending MRI if she is concerned about osteomyelitis since they x-ray appears to be normal.

## 2017-10-12 ENCOUNTER — Other Ambulatory Visit: Payer: Self-pay | Admitting: Internal Medicine

## 2017-10-20 ENCOUNTER — Other Ambulatory Visit: Payer: Self-pay | Admitting: Internal Medicine

## 2017-10-23 DIAGNOSIS — E11621 Type 2 diabetes mellitus with foot ulcer: Secondary | ICD-10-CM | POA: Diagnosis not present

## 2017-10-23 DIAGNOSIS — L97513 Non-pressure chronic ulcer of other part of right foot with necrosis of muscle: Secondary | ICD-10-CM | POA: Diagnosis not present

## 2017-10-23 DIAGNOSIS — E1140 Type 2 diabetes mellitus with diabetic neuropathy, unspecified: Secondary | ICD-10-CM | POA: Diagnosis not present

## 2017-11-06 DIAGNOSIS — E11621 Type 2 diabetes mellitus with foot ulcer: Secondary | ICD-10-CM | POA: Diagnosis not present

## 2017-11-06 DIAGNOSIS — L97512 Non-pressure chronic ulcer of other part of right foot with fat layer exposed: Secondary | ICD-10-CM | POA: Diagnosis not present

## 2017-11-06 DIAGNOSIS — E1140 Type 2 diabetes mellitus with diabetic neuropathy, unspecified: Secondary | ICD-10-CM | POA: Diagnosis not present

## 2017-11-13 DIAGNOSIS — E11621 Type 2 diabetes mellitus with foot ulcer: Secondary | ICD-10-CM | POA: Diagnosis not present

## 2017-11-13 DIAGNOSIS — E1140 Type 2 diabetes mellitus with diabetic neuropathy, unspecified: Secondary | ICD-10-CM | POA: Diagnosis not present

## 2017-11-13 DIAGNOSIS — I872 Venous insufficiency (chronic) (peripheral): Secondary | ICD-10-CM | POA: Diagnosis not present

## 2017-11-13 DIAGNOSIS — L97512 Non-pressure chronic ulcer of other part of right foot with fat layer exposed: Secondary | ICD-10-CM | POA: Diagnosis not present

## 2017-11-20 DIAGNOSIS — L97512 Non-pressure chronic ulcer of other part of right foot with fat layer exposed: Secondary | ICD-10-CM | POA: Diagnosis not present

## 2017-11-20 DIAGNOSIS — E11621 Type 2 diabetes mellitus with foot ulcer: Secondary | ICD-10-CM | POA: Diagnosis not present

## 2017-11-20 DIAGNOSIS — E1140 Type 2 diabetes mellitus with diabetic neuropathy, unspecified: Secondary | ICD-10-CM | POA: Diagnosis not present

## 2017-11-20 DIAGNOSIS — I872 Venous insufficiency (chronic) (peripheral): Secondary | ICD-10-CM | POA: Diagnosis not present

## 2017-11-24 ENCOUNTER — Other Ambulatory Visit: Payer: Self-pay | Admitting: Internal Medicine

## 2017-12-06 DIAGNOSIS — L97512 Non-pressure chronic ulcer of other part of right foot with fat layer exposed: Secondary | ICD-10-CM | POA: Diagnosis not present

## 2017-12-06 DIAGNOSIS — E1140 Type 2 diabetes mellitus with diabetic neuropathy, unspecified: Secondary | ICD-10-CM | POA: Diagnosis not present

## 2017-12-06 DIAGNOSIS — E11621 Type 2 diabetes mellitus with foot ulcer: Secondary | ICD-10-CM | POA: Diagnosis not present

## 2017-12-06 DIAGNOSIS — I872 Venous insufficiency (chronic) (peripheral): Secondary | ICD-10-CM | POA: Diagnosis not present

## 2017-12-07 ENCOUNTER — Ambulatory Visit (INDEPENDENT_AMBULATORY_CARE_PROVIDER_SITE_OTHER): Payer: Medicare Other | Admitting: Internal Medicine

## 2017-12-07 ENCOUNTER — Encounter: Payer: Self-pay | Admitting: Internal Medicine

## 2017-12-07 VITALS — BP 138/80 | HR 74 | Ht 71.0 in | Wt 230.4 lb

## 2017-12-07 DIAGNOSIS — E669 Obesity, unspecified: Secondary | ICD-10-CM | POA: Diagnosis not present

## 2017-12-07 DIAGNOSIS — E1140 Type 2 diabetes mellitus with diabetic neuropathy, unspecified: Secondary | ICD-10-CM

## 2017-12-07 DIAGNOSIS — E66811 Obesity, class 1: Secondary | ICD-10-CM | POA: Insufficient documentation

## 2017-12-07 DIAGNOSIS — E785 Hyperlipidemia, unspecified: Secondary | ICD-10-CM | POA: Diagnosis not present

## 2017-12-07 LAB — POCT GLYCOSYLATED HEMOGLOBIN (HGB A1C): Hemoglobin A1C: 7.6 % — AB (ref 4.0–5.6)

## 2017-12-07 NOTE — Patient Instructions (Addendum)
Please continue: - Metformin 2000 mg with dinner - Trulicity 1.5 mg weekly. - Glipizide XL 10 mg in am and 5 mg before dinner  Please start the Livongo challenge.   Please return in 3-4 months with your sugar log.

## 2017-12-07 NOTE — Progress Notes (Signed)
Patient ID: Lucas [name], male   DOB: 01-04-1941, 77 y.o.   MRN: [id number]  HPI: Lucas [name] is a 77 y.o.-year-old male, returning for f/u for DM2, dx in 1998, non-insulin-dependent (previous Agent Orange exposure in [place]), uncontrolled, with complications (mild CKD, foot ulcer). Last visit 4 mo ago.  He had Krysexxa injections for gout. He had an ulcerated tophus >> sees wound care and had 2 skin grafts >> now foot bandaged.  He cannot exercise.  He will start a coaching pgm with Livongo >> meal adjustment.  Last hemoglobin A1c was: Lab Results  Component Value Date   HGBA1C 7.8 08/06/2017   HGBA1C 7.4 05/10/2017   HGBA1C 8.3 11/29/2016  07/13/2016: HbA1c 8.0% 04/15/2015: 7.6%  Pt is on a regimen of: - Metformin 2000 mg with dinner - Trulicity 1.5 mg weekly. - Glipizide XL 10 mg in am and 5 mg before dinner He had to stop [place] as K increased (took 1 mo, off for 1.5 mo). Invokana was not covered.  Pt checks his sugars 1-2x a day: - am: 113-173 >> 117-188, 211 >> 139-167, 203, 245 - 2h after b'fast:189 >> n/c >> 312 >> n/c - before lunch:163, 168 >> 137 >> 122 >> 98, 117,140s - 2h after lunch: 116, 140 >> n/c - before dinner:  80, 151 >> n/c >> 177 >> 89, 150-189 - 2h after dinner:n/c >> 139 >> n/c  - bedtime:  122-155, 228 >> 82, 93 >> n/c >> 87 >> n/c - nighttime: n/c >> 165 >> n/c Lowest sugar was 70 >> 117 >> 89; he has hypoglycemia awareness in the 70s. Highest sugar was 200 >> 312 >> 245.  Glucometer: Precision >> One Touch Ultra mini >> Livongo  Pt's meals are: - Breakfast:  Oatmeal, wheat English muffin  - Lunch: salad, 1/2 sandwich - Dinner: meat + veggies, no bread - Snacks: 2x a day, fruit bar, fresh fruit  He was doing water exercises before, but stopped before last visit 2/2 gout.  + mild CKD, last BUN/creatinine:  Lab Results  Component Value Date   BUN 15 08/20/2017   CREATININE 0.99 08/20/2017  04/16/2017: 15/1.2 07/13/2016: BUN/Cr  13/1.0, Glu 116, UACR 0.5 11/10/2015: EGFR 80, creatinine 1.1 04/15/2015: GFR 88.1, Cr 1.06 On Lisinopril.  + HL; last set of lipids: Lab Results  Component Value Date   CHOL 175 08/20/2017   HDL 37.20 (L) 08/20/2017   LDLCALC 104 07/13/2016   LDLDIRECT 96.0 08/20/2017   TRIG 329.0 (H) 08/20/2017   CHOLHDL 5 08/20/2017  04/16/2016: 179/288/44/77 04/15/2015: LDL 89 On Pravastatin. - last dilated eye exam was in 09/2017: No DR. he has non-tension glaucoma. - no numbness and tingling in his feet.  He was in a DM clinical trial before: "Jump Start" lifestyle Research Trial.  He also has a history of  HTN, ED. He has gout - on Alopurinol >> Uloric (b/c rash with Alopurinol).   He had a R foot ulcer 04/2017. He is seen by a vascular dr. Lynnell Dike normal.  ROS: Constitutional: no weight gain/no weight loss, no fatigue, no subjective hyperthermia, no subjective hypothermia Eyes: no blurry vision, no xerophthalmia ENT: no sore throat, no nodules palpated in throat, no dysphagia, no odynophagia, no hoarseness Cardiovascular: no CP/no SOB/no palpitations/no leg swelling Respiratory: no cough/no SOB/no wheezing Gastrointestinal: no N/no V/no D/no C/no acid reflux Musculoskeletal: no muscle aches/no joint aches Skin: + rash, no hair loss Neurological: no tremors/no numbness/no tingling/no dizziness + Difficulty with erections  I reviewed pt's medications, allergies, PMH, social hx, family hx, and changes were documented in the history of present illness. Otherwise, unchanged from my initial visit note.  Past Medical History:  Diagnosis Date  . Allergic rhinitis   . Diabetes mellitus type II   . Diverticulosis of colon   . Glaucoma   . History of agent Orange exposure   . HLD (hyperlipidemia)   . HTN (hypertension)   . Hypertrophy of prostate without urinary obstruction and other lower urinary tract symptoms (LUTS)   . Osteoarthritis    Past Surgical History:  Procedure Laterality  Date  . TOOTH EXTRACTION       History   Social History  . Marital Status: Married    Spouse Name: N/A  . Number of Children: 2   Occupational History  . Retired-[employer]    Social History Main Topics  . Smoking status: Never Smoker   . Smokeless tobacco: Never Used  . Alcohol Use: No  . Drug Use: No    [place] vet- 20% disability from shrapnel wounds 1967 and 20% agent orange   Married (Widowed 1996; remarried 6/03)   Retired [employer]      Has living will   Wife, then daughter Hoyle Barr will be health care POA   Would want attempts at resuscitation   Would not want feeding tube if cognitively unaware   Current Outpatient Medications on File Prior to Visit  Medication Sig Dispense Refill  . albuterol (VENTOLIN HFA) 108 (90 BASE) MCG/ACT inhaler Inhale 2 puffs into the lungs every 4 (four) hours as needed.      Marland Kitchen aspirin 81 MG tablet Take 81 mg by mouth daily.      . brimonidine (ALPHAGAN) 0.2 % ophthalmic solution Place 1 drop into both eyes 2 (two) times daily.     . cetirizine (ZYRTEC) 10 MG tablet Take 10 mg by mouth daily.    . colchicine 0.6 MG tablet Take 0.6 mg by mouth as needed. Take 2 tablets by mouth at first sign of gout flare followed by 1 tablet after 1 hour. Next day 1 tab;et for 5 days    . COMBIGAN 0.2-0.5 % ophthalmic solution     . Dulaglutide (TRULICITY) 1.5 GG/2.6RS SOPN INJECT ONE PEN (1.5 MG TOTAL) UNDER SKIN ON SUNDAYS 4 pen 3  . fluocinonide cream (LIDEX) 0.05 % APPLY TWICE DAILY TO AFFECTED AREAS OF LEGS&ARMS UNTIL CLEAR,THEN AS NEEDED. AVOID FACE,GROIN,AXILLA  0  . fluticasone (FLONASE) 50 MCG/ACT nasal spray     . gabapentin (NEURONTIN) 300 MG capsule TAKE 2 CAPSULES (600 MG TOTAL) BY MOUTH 3 (THREE) TIMES DAILY. 540 capsule 1  . glipiZIDE (GLUCOTROL XL) 5 MG 24 hr tablet TAKE 2 TABLETS BY MOUTH IN THE AM AND 1 TABLET BEFORE DINNER 270 tablet 0  . glucose blood (ONE TOUCH ULTRA TEST) test strip Use as instructed to check sugar 2 times daily 200 each 5  .  indomethacin (INDOCIN) 50 MG capsule Take 1 capsule (50 mg total) by mouth 2 (two) times daily with a meal. 60 capsule 1  . ketoconazole (NIZORAL) 2 % cream     . latanoprost (XALATAN) 0.005 % ophthalmic solution Place 1 drop into both eyes at bedtime.     Marland Kitchen lisinopril (PRINIVIL,ZESTRIL) 20 MG tablet Take 10 mg by mouth daily.     . metFORMIN (GLUCOPHAGE) 1000 MG tablet Take 1,000 mg by mouth 2 (two) times daily with a meal.      . Misc Natural  Products (GLUCOSAMINE CHONDROITIN ADV PO) Take 2 tablets by mouth 2 (two) times daily.    . Multiple Vitamin (MULTIVITAMIN) tablet Take 1 tablet by mouth daily.      . mupirocin ointment (BACTROBAN) 2 % Apply to wound twice a day. 30 g 2  . pegloticase (KRYSTEXXA) 8 MG/ML injection Inject into the vein every 14 (fourteen) days.    . pravastatin (PRAVACHOL) 20 MG tablet Take 10 mg by mouth daily.     . sildenafil (REVATIO) 20 MG tablet TAKE 3 TO 5 TABLETS BY MOUTH ONCE DAILY AS NEEDED 50 tablet 11  . sildenafil (VIAGRA) 50 MG tablet     . triamcinolone ointment (KENALOG) 0.5 % Apply 1 application topically 2 (two) times daily. 30 g 0  . TRULICITY 1.5 ZM/6.2HU SOPN INJECT ONE PEN (1.5 MG TOTAL) UNDER SKIN ON SUNDAYS 6 pen 1  . TRULICITY 1.5 TM/5.4YT SOPN INJECT ONE PEN (1.5 MG TOTAL) UNDER SKIN ON SUNDAYS 6 pen 1  . ULORIC 40 MG tablet Take 40 mg by mouth daily.  4  . Vitamin D, Cholecalciferol, 1000 UNITS CAPS Take 1 capsule by mouth daily.     No current facility-administered medications on file prior to visit.    No Known Allergies Family History  Problem Relation Age of Onset  . Colon cancer Father   . Stroke Mother   . Heart attack Mother   . Coronary artery disease Mother   . Hypertension Unknown        several siblings  . Diabetes Unknown        several siblings  . Coronary artery disease Brother   . ALS Sister    PE: BP 138/80   Pulse 74   Ht 5' 11"  (1.803 m)   Wt 230 lb 6.4 oz (104.5 kg)   SpO2 97%   BMI 32.13 kg/m  Body mass  index is 32.13 kg/m. Wt Readings from Last 3 Encounters:  12/07/17 230 lb 6.4 oz (104.5 kg)  08/20/17 230 lb (104.3 kg)  08/06/17 230 lb 3.2 oz (104.4 kg)   Constitutional: overweight, in NAD Eyes: PERRLA, EOMI, no exophthalmos ENT: moist mucous membranes, no thyromegaly, no cervical lymphadenopathy Cardiovascular: RRR, No MRG Respiratory: CTA B Gastrointestinal: abdomen soft, NT, ND, BS+ Musculoskeletal: no deformities, strength intact in all 4, right foot and lower leg bandaged and improved Skin: moist, warm, no rashes Neurological: no tremor with outstretched hands, DTR normal in all 4  ASSESSMENT: 1. DM2, non-insulin-dependent, uncontrolled, with complications - R foot ulcer. Nl ABIs - mild CKD  2. HL  3.  Obesity  PLAN:  1. Patient with long standing, uncontrolled, DM2, on oral antidiabetes regimen and also GLP1 R agonist, with higher sugars at last visit as he could not be very active due to his gout.  However, this improved, and at last visit he was planning to start exercise again.  He was also planning to cut back on his snacks.  He preferred to continue the same regimen while he is working on his diet and exercise. - At this visit, sugars are still high as he could not exercise.  However, they are slightly better than at last visit.  He will start a dietary program offered by his Livongo meter.  The first challenge is to feel half of his plate with non-starchy vegetables for at least one meal of the day.  I think this will help a lot in controlling his diabetes therefore, we will not change his regimen at  this time. - I suggested to:  Patient Instructions  Please continue: - Metformin 2000 mg with dinner - Trulicity 1.5 mg weekly. - Glipizide XL 10 mg in am and 5 mg before dinner  Please return in 3-4 months with your sugar log.   - today, HbA1c is 7.6% (slightly better) - continue checking sugars at different times of the day - check 1x a day, rotating checks -  advised for yearly eye exams >> he is UTD - Return to clinic in 3-4 mo with sugar log    2. HL - Reviewed latest lipid panel from 3 mo ago:LDL slightly better, at goal now Lab Results  Component Value Date   CHOL 175 08/20/2017   HDL 37.20 (L) 08/20/2017   LDLCALC 104 07/13/2016   LDLDIRECT 96.0 08/20/2017   TRIG 329.0 (H) 08/20/2017   CHOLHDL 5 08/20/2017  - Continues pravastatin without side effects.  3. Obesity -Weight is stable -Discussed about improvement in his diet and I strongly recommended to start the [place] pgm -He also hopes that he can start exercising soon after his ulcer heals in his boot comes off   Philemon Kingdom, MD PhD [facility]

## 2017-12-13 DIAGNOSIS — L97512 Non-pressure chronic ulcer of other part of right foot with fat layer exposed: Secondary | ICD-10-CM | POA: Diagnosis not present

## 2017-12-13 DIAGNOSIS — E11621 Type 2 diabetes mellitus with foot ulcer: Secondary | ICD-10-CM | POA: Diagnosis not present

## 2017-12-13 DIAGNOSIS — I872 Venous insufficiency (chronic) (peripheral): Secondary | ICD-10-CM | POA: Diagnosis not present

## 2017-12-13 DIAGNOSIS — E1140 Type 2 diabetes mellitus with diabetic neuropathy, unspecified: Secondary | ICD-10-CM | POA: Diagnosis not present

## 2017-12-20 DIAGNOSIS — E1140 Type 2 diabetes mellitus with diabetic neuropathy, unspecified: Secondary | ICD-10-CM | POA: Diagnosis not present

## 2017-12-20 DIAGNOSIS — Z8739 Personal history of other diseases of the musculoskeletal system and connective tissue: Secondary | ICD-10-CM | POA: Diagnosis not present

## 2017-12-20 DIAGNOSIS — E11621 Type 2 diabetes mellitus with foot ulcer: Secondary | ICD-10-CM | POA: Diagnosis not present

## 2017-12-20 DIAGNOSIS — I872 Venous insufficiency (chronic) (peripheral): Secondary | ICD-10-CM | POA: Diagnosis not present

## 2017-12-20 DIAGNOSIS — L97512 Non-pressure chronic ulcer of other part of right foot with fat layer exposed: Secondary | ICD-10-CM | POA: Diagnosis not present

## 2017-12-26 DIAGNOSIS — E119 Type 2 diabetes mellitus without complications: Secondary | ICD-10-CM | POA: Diagnosis not present

## 2017-12-26 DIAGNOSIS — H401232 Low-tension glaucoma, bilateral, moderate stage: Secondary | ICD-10-CM | POA: Diagnosis not present

## 2017-12-27 DIAGNOSIS — E11621 Type 2 diabetes mellitus with foot ulcer: Secondary | ICD-10-CM | POA: Diagnosis not present

## 2017-12-27 DIAGNOSIS — L97512 Non-pressure chronic ulcer of other part of right foot with fat layer exposed: Secondary | ICD-10-CM | POA: Diagnosis not present

## 2017-12-27 DIAGNOSIS — E1140 Type 2 diabetes mellitus with diabetic neuropathy, unspecified: Secondary | ICD-10-CM | POA: Diagnosis not present

## 2017-12-27 DIAGNOSIS — I872 Venous insufficiency (chronic) (peripheral): Secondary | ICD-10-CM | POA: Diagnosis not present

## 2018-01-03 DIAGNOSIS — L97512 Non-pressure chronic ulcer of other part of right foot with fat layer exposed: Secondary | ICD-10-CM | POA: Diagnosis not present

## 2018-01-03 DIAGNOSIS — I872 Venous insufficiency (chronic) (peripheral): Secondary | ICD-10-CM | POA: Diagnosis not present

## 2018-01-03 DIAGNOSIS — E1140 Type 2 diabetes mellitus with diabetic neuropathy, unspecified: Secondary | ICD-10-CM | POA: Diagnosis not present

## 2018-01-03 DIAGNOSIS — E11621 Type 2 diabetes mellitus with foot ulcer: Secondary | ICD-10-CM | POA: Diagnosis not present

## 2018-01-14 DIAGNOSIS — E11621 Type 2 diabetes mellitus with foot ulcer: Secondary | ICD-10-CM | POA: Diagnosis not present

## 2018-01-14 DIAGNOSIS — L97512 Non-pressure chronic ulcer of other part of right foot with fat layer exposed: Secondary | ICD-10-CM | POA: Diagnosis not present

## 2018-01-14 DIAGNOSIS — I872 Venous insufficiency (chronic) (peripheral): Secondary | ICD-10-CM | POA: Diagnosis not present

## 2018-01-14 DIAGNOSIS — E1140 Type 2 diabetes mellitus with diabetic neuropathy, unspecified: Secondary | ICD-10-CM | POA: Diagnosis not present

## 2018-01-28 DIAGNOSIS — E11621 Type 2 diabetes mellitus with foot ulcer: Secondary | ICD-10-CM | POA: Diagnosis not present

## 2018-01-28 DIAGNOSIS — I872 Venous insufficiency (chronic) (peripheral): Secondary | ICD-10-CM | POA: Diagnosis not present

## 2018-01-28 DIAGNOSIS — L97512 Non-pressure chronic ulcer of other part of right foot with fat layer exposed: Secondary | ICD-10-CM | POA: Diagnosis not present

## 2018-01-28 DIAGNOSIS — E1140 Type 2 diabetes mellitus with diabetic neuropathy, unspecified: Secondary | ICD-10-CM | POA: Diagnosis not present

## 2018-02-07 DIAGNOSIS — E11621 Type 2 diabetes mellitus with foot ulcer: Secondary | ICD-10-CM | POA: Diagnosis not present

## 2018-02-07 DIAGNOSIS — E1140 Type 2 diabetes mellitus with diabetic neuropathy, unspecified: Secondary | ICD-10-CM | POA: Diagnosis not present

## 2018-02-07 DIAGNOSIS — L97511 Non-pressure chronic ulcer of other part of right foot limited to breakdown of skin: Secondary | ICD-10-CM | POA: Diagnosis not present

## 2018-02-21 ENCOUNTER — Other Ambulatory Visit: Payer: Self-pay

## 2018-02-21 DIAGNOSIS — L97511 Non-pressure chronic ulcer of other part of right foot limited to breakdown of skin: Secondary | ICD-10-CM | POA: Diagnosis not present

## 2018-02-21 DIAGNOSIS — E11621 Type 2 diabetes mellitus with foot ulcer: Secondary | ICD-10-CM | POA: Diagnosis not present

## 2018-02-21 MED ORDER — GLIPIZIDE ER 5 MG PO TB24: ORAL_TABLET | ORAL | 0 refills | Status: DC

## 2018-03-03 ENCOUNTER — Other Ambulatory Visit: Payer: Self-pay | Admitting: Internal Medicine

## 2018-03-18 ENCOUNTER — Encounter: Payer: Self-pay | Admitting: Internal Medicine

## 2018-03-18 ENCOUNTER — Ambulatory Visit (INDEPENDENT_AMBULATORY_CARE_PROVIDER_SITE_OTHER): Payer: Medicare Other | Admitting: Internal Medicine

## 2018-03-18 VITALS — BP 110/70 | HR 77 | Ht 71.0 in | Wt 229.0 lb

## 2018-03-18 DIAGNOSIS — E785 Hyperlipidemia, unspecified: Secondary | ICD-10-CM | POA: Diagnosis not present

## 2018-03-18 DIAGNOSIS — E1165 Type 2 diabetes mellitus with hyperglycemia: Secondary | ICD-10-CM | POA: Diagnosis not present

## 2018-03-18 DIAGNOSIS — E669 Obesity, unspecified: Secondary | ICD-10-CM

## 2018-03-18 LAB — POCT GLYCOSYLATED HEMOGLOBIN (HGB A1C): Hemoglobin A1C: 7.9 % — AB (ref 4.0–5.6)

## 2018-03-18 MED ORDER — GLIPIZIDE ER 5 MG PO TB24: ORAL_TABLET | ORAL | 3 refills | Status: DC

## 2018-03-18 MED ORDER — DULAGLUTIDE 1.5 MG/0.5ML ~~LOC~~ SOAJ: SUBCUTANEOUS | 3 refills | Status: DC

## 2018-03-18 NOTE — Progress Notes (Signed)
Patient ID: Lucas [name], male   DOB: 11/16/1940, 77 y.o.   MRN: [id number]  HPI: [name] is a 77 y.o.-year-old male, returning for f/u for DM2, dx in 1998, non-insulin-dependent (previous Agent Orange exposure in [place]), uncontrolled, with complications (mild CKD, foot ulcer). Last visit 3.5 mo ago.  At last visit, he was getting Krysexxa injections for gout.  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  This is almost healed.  He could not exercise when I last saw him >> now he can (foot out of boot) but did not start yet.   Last hemoglobin A1c was: Lab Results  Component Value Date   HGBA1C 7.6 (A) 12/07/2017   HGBA1C 7.8 08/06/2017   HGBA1C 7.4 05/10/2017  07/13/2016: HbA1c 8.0% 04/15/2015: 7.6%  Pt is on a regimen of: - Metformin 2000 mg before bedtime - Trulicity 1.5 mg weekly - Glipizide XL 10 mg in a.m. and 5 mg before dinner He had to stop [place] as K increased (took 1 mo, off for 1.5 mo). Invokana was not covered.  Pt checks his sugars 1-2 times a day: - am: 113-173 >> 117-188, 211 >> 139-167, 203, 245 >> 128, 146-197, 238 - 2h after b'fast:189 >> n/c >> 312 >> n/c - before lunch:163, 168 >> 137 >> 122 >> 98, 117,140s >> 83-155 - 2h after lunch: 116, 140 >> n/c - before dinner:  80, 151 >> n/c >> 177 >> 89, 150-189 >> 97, 101-152 - 2h after dinner:n/c >> 139 >> n/c  - bedtime:  122-155, 228 >> 82, 93 >> n/c >> 87 >> n/c >> 78, 82, 137, 181 - nighttime: n/c >> 165 >> n/c Lowest sugar was 70 >> 117 >> 89 >> 86; he has hypoglycemia awareness in the 70s. Highest sugar was 200 >> 312 >> 245 >> 238.  Glucometer: Precision >> One Touch Ultra mini >> [place]  Pt's meals are: - Breakfast:  Oatmeal, wheat English muffin  - Lunch: salad, 1/2 sandwich - Dinner: meat + veggies, added carbs back - Snacks: 2x a day, fruit bar, fresh fruit He was doing water exercises before >> not anymore...  + Mild CKD, last BUN/creatinine:  Lab Results   Component Value Date   BUN 15 08/20/2017   CREATININE 0.99 08/20/2017  04/16/2017: 15/1.2 07/13/2016: BUN/Cr 13/1.0, Glu 116, UACR 0.5 11/10/2015: EGFR 80, creatinine 1.1 04/15/2015: GFR 88.1, Cr 1.06 On lisinopril.  + HL; last set of lipids: Lab Results  Component Value Date   CHOL 175 08/20/2017   HDL 37.20 (L) 08/20/2017   LDLCALC 104 07/13/2016   LDLDIRECT 96.0 08/20/2017   TRIG 329.0 (H) 08/20/2017   CHOLHDL 5 08/20/2017  04/16/2016: 179/288/44/77 04/15/2015: LDL 89 On pravastatin. - last dilated eye exam was in 09/2017: No DR.he has known tension glaucoma. -He denies numbness and tingling in his feet.  He was in a DM clinical trial before: "Jump Start" lifestyle Research Trial.  He also has a history of  HTN, ED. He has gout - on Alopurinol >> Uloric (he had a rash with allopurinol)  He had a R foot ulcer 04/2017 and again in 11/2017. He is seen by a vascular dr.  Lynnell Dike were normal.  ROS: Constitutional: no weight gain/no weight loss, + fatigue, no subjective hyperthermia, no subjective hypothermia Eyes: no blurry vision, no xerophthalmia ENT: no sore throat, no nodules palpated in neck, no dysphagia, no odynophagia, no hoarseness Cardiovascular: no CP/no SOB/no palpitations/no  leg swelling Respiratory: no cough/no SOB/no wheezing Gastrointestinal: no N/no V/no D/no C/no acid reflux Musculoskeletal: no muscle aches/no joint aches Skin: no rashes, no hair loss Neurological: no tremors/no numbness/no tingling/no dizziness  I reviewed pt's medications, allergies, PMH, social hx, family hx, and changes were documented in the history of present illness. Otherwise, unchanged from my initial visit note.  Past Medical History:  Diagnosis Date  . Allergic rhinitis   . Diabetes mellitus type II   . Diverticulosis of colon   . Glaucoma   . History of agent Orange exposure   . HLD (hyperlipidemia)   . HTN (hypertension)   . Hypertrophy of prostate without urinary  obstruction and other lower urinary tract symptoms (LUTS)   . Osteoarthritis    Past Surgical History:  Procedure Laterality Date  . TOOTH EXTRACTION       History   Social History  . Marital Status: Married    Spouse Name: N/A  . Number of Children: 2   Occupational History  . Retired-[employer]    Social History Main Topics  . Smoking status: Never Smoker   . Smokeless tobacco: Never Used  . Alcohol Use: No  . Drug Use: No    [place] vet- 20% disability from shrapnel wounds 1967 and 20% agent orange   Married (Widowed 1996; remarried 6/03)   Retired [employer]      Has living will   Wife, then daughter Hoyle Barr will be health care POA   Would want attempts at resuscitation   Would not want feeding tube if cognitively unaware   Current Outpatient Medications on File Prior to Visit  Medication Sig Dispense Refill  . albuterol (VENTOLIN HFA) 108 (90 BASE) MCG/ACT inhaler Inhale 2 puffs into the lungs every 4 (four) hours as needed.      Marland Kitchen aspirin 81 MG tablet Take 81 mg by mouth daily.      . brimonidine (ALPHAGAN) 0.2 % ophthalmic solution Place 1 drop into both eyes 2 (two) times daily.     . cetirizine (ZYRTEC) 10 MG tablet Take 10 mg by mouth daily.    . colchicine 0.6 MG tablet Take 0.6 mg by mouth as needed. Take 2 tablets by mouth at first sign of gout flare followed by 1 tablet after 1 hour. Next day 1 tab;et for 5 days    . COMBIGAN 0.2-0.5 % ophthalmic solution     . Dulaglutide (TRULICITY) 1.5 OE/3.2ZY SOPN INJECT ONE PEN (1.5 MG TOTAL) UNDER SKIN ON SUNDAYS 4 pen 3  . fluocinonide cream (LIDEX) 0.05 % APPLY TWICE DAILY TO AFFECTED AREAS OF LEGS&ARMS UNTIL CLEAR,THEN AS NEEDED. AVOID FACE,GROIN,AXILLA  0  . fluticasone (FLONASE) 50 MCG/ACT nasal spray     . gabapentin (NEURONTIN) 300 MG capsule TAKE 2 CAPSULES (600 MG TOTAL) BY MOUTH 3 (THREE) TIMES DAILY. 540 capsule 1  . glipiZIDE (GLUCOTROL XL) 5 MG 24 hr tablet TAKE 2 TABLETS BY MOUTH IN THE AM AND 1 TABLET BEFORE DINNER  270 tablet 0  . glucose blood (ONE TOUCH ULTRA TEST) test strip Use as instructed to check sugar 2 times daily 200 each 5  . indomethacin (INDOCIN) 50 MG capsule Take 1 capsule (50 mg total) by mouth 2 (two) times daily with a meal. 60 capsule 1  . ketoconazole (NIZORAL) 2 % cream     . latanoprost (XALATAN) 0.005 % ophthalmic solution Place 1 drop into both eyes at bedtime.     Marland Kitchen lisinopril (PRINIVIL,ZESTRIL) 20 MG tablet Take 10  mg by mouth daily.     . metFORMIN (GLUCOPHAGE) 1000 MG tablet Take 1,000 mg by mouth 2 (two) times daily with a meal.      . Misc Natural Products (GLUCOSAMINE CHONDROITIN ADV PO) Take 2 tablets by mouth 2 (two) times daily.    . Multiple Vitamin (MULTIVITAMIN) tablet Take 1 tablet by mouth daily.      . mupirocin ointment (BACTROBAN) 2 % Apply to wound twice a day. 30 g 2  . pegloticase (KRYSTEXXA) 8 MG/ML injection Inject into the vein every 14 (fourteen) days.    . pravastatin (PRAVACHOL) 20 MG tablet Take 10 mg by mouth daily.     . sildenafil (REVATIO) 20 MG tablet TAKE 3 TO 5 TABLETS BY MOUTH ONCE DAILY AS NEEDED 50 tablet 11  . sildenafil (VIAGRA) 50 MG tablet     . triamcinolone ointment (KENALOG) 0.5 % Apply 1 application topically 2 (two) times daily. 30 g 0  . TRULICITY 1.5 KG/4.0NU SOPN INJECT ONE PEN (1.5 MG TOTAL) UNDER SKIN ON SUNDAYS 6 pen 1  . ULORIC 40 MG tablet Take 40 mg by mouth daily.  4  . Vitamin D, Cholecalciferol, 1000 UNITS CAPS Take 1 capsule by mouth daily.     No current facility-administered medications on file prior to visit.    No Known Allergies Family History  Problem Relation Age of Onset  . Colon cancer Father   . Stroke Mother   . Heart attack Mother   . Coronary artery disease Mother   . Hypertension Unknown        several siblings  . Diabetes Unknown        several siblings  . Coronary artery disease Brother   . ALS Sister    PE: BP 110/70   Pulse 77   Ht 5' 11"  (1.803 m) Comment: measured  Wt 229 lb (103.9 kg)    SpO2 97%   BMI 31.94 kg/m  Body mass index is 31.94 kg/m. Wt Readings from Last 3 Encounters:  03/18/18 229 lb (103.9 kg)  12/07/17 230 lb 6.4 oz (104.5 kg)  08/20/17 230 lb (104.3 kg)   Constitutional: overweight, in NAD Eyes: PERRLA, EOMI, no exophthalmos ENT: moist mucous membranes, no thyromegaly, no cervical lymphadenopathy Cardiovascular: RRR, No MRG Respiratory: CTA B Gastrointestinal: abdomen soft, NT, ND, BS+ Musculoskeletal: no deformities, strength intact in all 4 Skin: moist, warm, no rashes Neurological: no tremor with outstretched hands, DTR normal in all 4  ASSESSMENT: 1. DM2, non-insulin-dependent, uncontrolled, with complications - R foot ulcer. Nl ABIs - mild CKD  2. HL  3.  Obesity  PLAN:  1. Patient with longstanding, uncontrolled, type 2 diabetes, On oral antidiabetic regimen and also GLP-1 receptor agonist.  At last visit, HbA1c was slightly better, but still above target, at 7.6%.  At that time, he was preparing to start dietary program offered by his [place] meter.  He was also planning to cut back on his snacks and to restart exercise.  He was not able to add exercise before his last visit due to having a foot ulcer and wearing the boot.  This is healed. -Since last visit, however, he did not restart exercise.  Also, he started the dietary program and he is doing this off and on, but not consistently.  Subsequently, sugars in the morning are higher especially as he also reintroduced carbs with dinner.  He would like to start eliminating them again as he had good results in the past while  doing so.  I also strongly advised him to restart exercise.  He declines starting long-acting insulin for now and would like to work on the diet and exercise first.  I advised him about how to take the glipizide depending on the time of the day when he exercises. - I suggested to:  Patient Instructions  Please continue: - Metformin 2000 mg with dinner - Trulicity 1.5 mg  weekly - Glipizide XL 10 mg in a.m. and 5 mg before dinner  Please return in 3-4 months with your sugar log.   - today, HbA1c is 7.9% (higher) - continue checking sugars at different times of the day - check 1x a day, rotating checks - advised for yearly eye exams >> he is UTD - Return to clinic in 3-4 mo with sugar log   2. HL - Reviewed latest lipid panel from 6 mo ago: HDL low, LDL <100, TG high Lab Results  Component Value Date   CHOL 175 08/20/2017   HDL 37.20 (L) 08/20/2017   LDLCALC 104 07/13/2016   LDLDIRECT 96.0 08/20/2017   TRIG 329.0 (H) 08/20/2017   CHOLHDL 5 08/20/2017  - Continues pravastatin without side effects.  3. Obesity -No weight loss since last visit -At last visit, he was preparing to start the [place] program and I strongly advised him to lose -He was also planning to start exercising after his ulcer healed and his boot came off.  He did not start this but plans to do so.  I strongly advised him to start and also discussed about improving diet.   Philemon Kingdom, MD PhD [facility]

## 2018-03-18 NOTE — Patient Instructions (Addendum)
Please continue: - Metformin 2000 mg but move it with dinner  - Trulicity 1.5 mg weekly - Glipizide XL 10 mg in a.m. and 5 mg before dinner  Try to reduce carbs with dinner and start exercise most days of the week.  If you start exercising before dinner >> do not use Glipizide with dinner.  If you start exercising in am >> decrease Glipizide with b'fast.  Please return in 3-4 months with your sugar log.

## 2018-03-18 NOTE — Addendum Note (Signed)
Addended by: Cardell Peach I on: 03/18/2018 11:38 AM   Modules accepted: Orders

## 2018-03-27 DIAGNOSIS — H401232 Low-tension glaucoma, bilateral, moderate stage: Secondary | ICD-10-CM | POA: Diagnosis not present

## 2018-04-15 DIAGNOSIS — E11621 Type 2 diabetes mellitus with foot ulcer: Secondary | ICD-10-CM | POA: Diagnosis not present

## 2018-04-15 DIAGNOSIS — E669 Obesity, unspecified: Secondary | ICD-10-CM | POA: Diagnosis not present

## 2018-04-15 DIAGNOSIS — L97511 Non-pressure chronic ulcer of other part of right foot limited to breakdown of skin: Secondary | ICD-10-CM | POA: Diagnosis not present

## 2018-05-20 DIAGNOSIS — I872 Venous insufficiency (chronic) (peripheral): Secondary | ICD-10-CM | POA: Diagnosis not present

## 2018-06-26 DIAGNOSIS — H401232 Low-tension glaucoma, bilateral, moderate stage: Secondary | ICD-10-CM | POA: Diagnosis not present

## 2018-06-26 DIAGNOSIS — E119 Type 2 diabetes mellitus without complications: Secondary | ICD-10-CM | POA: Diagnosis not present

## 2018-06-26 LAB — HM DIABETES EYE EXAM

## 2018-07-05 ENCOUNTER — Encounter: Payer: Self-pay | Admitting: Internal Medicine

## 2018-07-16 ENCOUNTER — Ambulatory Visit: Payer: Medicare Other | Admitting: Internal Medicine

## 2018-07-18 ENCOUNTER — Encounter: Payer: Self-pay | Admitting: Family Medicine

## 2018-07-18 ENCOUNTER — Ambulatory Visit (INDEPENDENT_AMBULATORY_CARE_PROVIDER_SITE_OTHER)
Admission: RE | Admit: 2018-07-18 | Discharge: 2018-07-18 | Disposition: A | Discharge status: Home / Self Care | Payer: Medicare Other | Source: Ambulatory Visit | Attending: Family Medicine | Admitting: Family Medicine

## 2018-07-18 ENCOUNTER — Other Ambulatory Visit: Payer: Self-pay

## 2018-07-18 ENCOUNTER — Encounter: Payer: Self-pay | Admitting: *Deleted

## 2018-07-18 ENCOUNTER — Ambulatory Visit (INDEPENDENT_AMBULATORY_CARE_PROVIDER_SITE_OTHER): Payer: Medicare Other | Admitting: Family Medicine

## 2018-07-18 VITALS — BP 100/60 | HR 74 | Temp 97.8°F | Ht 71.0 in | Wt 227.5 lb

## 2018-07-18 DIAGNOSIS — S6992XA Unspecified injury of left wrist, hand and finger(s), initial encounter: Secondary | ICD-10-CM

## 2018-07-18 DIAGNOSIS — S62329A Displaced fracture of shaft of unspecified metacarpal bone, initial encounter for closed fracture: Secondary | ICD-10-CM | POA: Diagnosis not present

## 2018-07-18 DIAGNOSIS — M7989 Other specified soft tissue disorders: Secondary | ICD-10-CM | POA: Diagnosis not present

## 2018-07-18 NOTE — Progress Notes (Signed)
Lucas Cham T. Deshane Cotroneo, MD Primary [facility] and [place] at [facility] [place], [address] Phone: [phone]   FAX: [phone]  Lucas [name] - 78 y.o. male   MRN [id number]   Date of Birth: [birth year]  Visit Date: 07/18/2018   PCP: [name], MD   Referred by: [name], MD  Chief Complaint  Patient presents with   Lucas [name] around 2 pm   Hand Injury    Left   Subjective:   Lucas [name] is a 78 y.o. very pleasant male patient who presents with the following:  DOI 07/17/2018.  Fell L hand.  Entire wrist is quite swollen. Lillie.  He is having some pain, but is not that bad.  He is having diffuse swelling in the hand and wrist.  Currently, there is no significant ecchymosis.  He is not having pain in the fingers at all.  No significant pain in the radius and ulna.  Primary pain is throughout the wrist itself.  He is never had a prior fracture or surgery in the affected hand.  Very swollen in the true wrist which is the point of maximal tenderness.  Past Medical History, Surgical History, Social History, Family History, Problem List, Medications, and Allergies have been reviewed and updated if relevant.  Patient Active Problem List   Diagnosis Date Noted   Obesity, Class I, BMI 30-34.9 12/07/2017   Skin ulcer of right great toe ([place]) 05/04/2017   Acute gout involving toe of right foot 07/17/2016   DDD (degenerative disc disease), lumbar 05/16/2016   Type 2 diabetes mellitus with hyperglycemia, without long-term current use of insulin ([place]) 07/28/2015   Advance directive discussed with patient 07/28/2015   Elevated rheumatoid factor 07/08/2015   Gouty arthropathy, chronic, without tophi 07/08/2015   Hypertension 07/08/2015   Obstructive sleep apnea 07/22/2014   Routine general medical examination at a health care facility 06/06/2011   BPH with obstruction/lower urinary tract  symptoms    Generalized osteoarthritis of multiple sites 09/20/2006   Hyperlipidemia 09/17/2006   GLAUCOMA 09/17/2006   ALLERGIC RHINITIS 09/17/2006   DIVERTICULOSIS, COLON 09/17/2006    Past Medical History:  Diagnosis Date   Allergic rhinitis    Diabetes mellitus type II    Diverticulosis of colon    Glaucoma    History of agent Orange exposure    HLD (hyperlipidemia)    HTN (hypertension)    Hypertrophy of prostate without urinary obstruction and other lower urinary tract symptoms (LUTS)    Osteoarthritis     Past Surgical History:  Procedure Laterality Date   TOOTH EXTRACTION      Social History   Socioeconomic History   Marital status: Married    Spouse name: Not on file   Number of children: Not on file   Years of education: Not on file   Highest education level: Not on file  Occupational History   Occupation: Retired-[employer]  Social Designer, fashion/clothing strain: Not on file   Food insecurity:    Worry: Not on file    Inability: Not on file   Transportation needs:    Medical: Not on file    Non-medical: Not on file  Tobacco Use   Smoking status: Never Smoker   Smokeless tobacco: Never Used  Substance and Sexual Activity   Alcohol use: No    Alcohol/week: 0.0 standard drinks   Drug use: No  Sexual activity: Not on file  Lifestyle   Physical activity:    Days per week: Not on file    Minutes per session: Not on file   Stress: Not on file  Relationships   Social connections:    Talks on phone: Not on file    Gets together: Not on file    Attends religious service: Not on file    Active member of club or organization: Not on file    Attends meetings of clubs or organizations: Not on file    Relationship status: Not on file   Intimate partner violence:    Fear of current or ex partner: Not on file    Emotionally abused: Not on file    Physically abused: Not on file    Forced sexual activity: Not on file  Other  Topics Concern   Not on file  Social History Narrative   [place] vet- 20% disability from shrapnel wounds 1967 and 20% agent orange   Married (Widowed 1996; remarried 6/03)   Retired [employer]      Has living will   Wife, then daughter Hoyle Barr will be health care POA   Would want attempts at resuscitation   Would not want feeding tube if cognitively unaware    Family History  Problem Relation Age of Onset   Colon cancer Father    Stroke Mother    Heart attack Mother    Coronary artery disease Mother    Hypertension Unknown        several siblings   Diabetes Unknown        several siblings   Coronary artery disease Brother    ALS Sister     No Known Allergies  Medication list reviewed and updated in full in [place].  GEN: No fevers, chills. Nontoxic. Primarily MSK c/o today. MSK: Detailed in the HPI GI: tolerating PO intake without difficulty Neuro: No numbness, parasthesias, or tingling associated. Otherwise the pertinent positives of the ROS are noted above.   Objective:   BP 100/60    Pulse 74    Temp 97.8 F (36.6 C) (Oral)    Ht 5\' 11"  (1.803 m)    Wt 227 lb 8 oz (103.2 kg)    SpO2 100%    BMI 31.73 kg/m    GEN: WDWN, NAD, Non-toxic, Alert & Oriented x 3 HEENT: Atraumatic, Normocephalic.  Ears and Nose: No external deformity. EXTR: No clubbing/cyanosis/edema NEURO: Normal gait.  PSYCH: Normally interactive. Conversant. Not depressed or anxious appearing.  Calm demeanor.    There is extensive swelling in the left wrist.  Nontender throughout all phalanges.  Maximal tenderness at the metacarpal bases as well as the wrist itself.  Nontender at the distal radius and ulna.  Ulnar and radial deviation do not cause significant pain.  Axial loading is limited.  Grip is moderately impaired compared to the contralateral side.  There is no tenderness in the anatomical snuffbox.  Radiology: Dg Wrist Complete Left  Result Date: 07/18/2018 CLINICAL DATA:   78 year old male with a history of fall and left hand injury EXAM: LEFT HAND - COMPLETE 3+ VIEW; LEFT WRIST - COMPLETE 3+ VIEW COMPARISON:  None. FINDINGS: Hand: Lateral view of the hand demonstrates soft tissue swelling on the dorsum of the hand. Tiny geometric calcific density identified on the dorsum of the hand, at the proximal metacarpals, seen only on the oblique image. No subluxation/dislocation. Mild degenerative changes of the interphalangeal joints. No erosive changes. Wrist: Carpal  bones maintain alignment. Unremarkable scaphoid. Soft tissue swelling on the dorsum at the base of the hand. IMPRESSION: Tiny calcific density in the dorsal soft tissues of the hand with associated soft tissue swelling overlying the proximal metacarpals, presumably a small chip fracture/avulsion fracture. If there was a history of penetrating injury, this could potentially represent a small radiopaque foreign body. Electronically Signed   By: Corrie Mckusick D.O.   On: 07/18/2018 12:39   Dg Hand Complete Left  Result Date: 07/18/2018 CLINICAL DATA:  78 year old male with a history of fall and left hand injury EXAM: LEFT HAND - COMPLETE 3+ VIEW; LEFT WRIST - COMPLETE 3+ VIEW COMPARISON:  None. FINDINGS: Hand: Lateral view of the hand demonstrates soft tissue swelling on the dorsum of the hand. Tiny geometric calcific density identified on the dorsum of the hand, at the proximal metacarpals, seen only on the oblique image. No subluxation/dislocation. Mild degenerative changes of the interphalangeal joints. No erosive changes. Wrist: Carpal bones maintain alignment. Unremarkable scaphoid. Soft tissue swelling on the dorsum at the base of the hand. IMPRESSION: Tiny calcific density in the dorsal soft tissues of the hand with associated soft tissue swelling overlying the proximal metacarpals, presumably a small chip fracture/avulsion fracture. If there was a history of penetrating injury, this could potentially represent a small  radiopaque foreign body. Electronically Signed   By: Corrie Mckusick D.O.   On: 07/18/2018 12:39     Assessment and Plan:   Hand injury, left, initial encounter - Plan: DG Hand Complete Left, DG Wrist Complete Left  Closed avulsion fracture of shaft of metacarpal bone, initial encounter  Swelling of left hand  >25 minutes spent in face to face time with patient, >50% spent in counselling or coordination of care   Small metacarpal avulsion fracture seen on plain film.  Occult hamate or capitate fracture cannot be excluded given pain of location.  I am going to have him follow-up in 2 weeks for reevaluation and to follow-up x-rays.  I placed him in a forearm splint for immobilization.  Take off at least 3 times a day for basic wrist motion.  Follow-up: Return in about 2 weeks (around 08/01/2018).  Orders Placed This Encounter  Procedures   DG Hand Complete Left   DG Wrist Complete Left    Signed,  Brielle Moro T. Lochlyn Zullo, MD   Outpatient Encounter Medications as of 07/18/2018  Medication Sig   albuterol (VENTOLIN HFA) 108 (90 BASE) MCG/ACT inhaler Inhale 2 puffs into the lungs every 4 (four) hours as needed.     aspirin 81 MG tablet Take 81 mg by mouth daily.     brimonidine (ALPHAGAN) 0.2 % ophthalmic solution Place 1 drop into both eyes 2 (two) times daily.    cetirizine (ZYRTEC) 10 MG tablet Take 10 mg by mouth daily.   colchicine 0.6 MG tablet Take 0.6 mg by mouth as needed. Take 2 tablets by mouth at first sign of gout flare followed by 1 tablet after 1 hour. Next day 1 tab;et for 5 days   COMBIGAN 0.2-0.5 % ophthalmic solution    Dulaglutide (TRULICITY) 1.5 OQ/9.4TM SOPN INJECT ONE PEN (1.5 MG TOTAL) UNDER SKIN ON SUNDAYS   fluocinonide cream (LIDEX) 0.05 % APPLY TWICE DAILY TO AFFECTED AREAS OF LEGS&ARMS UNTIL CLEAR,THEN AS NEEDED. AVOID FACE,GROIN,AXILLA   fluticasone (FLONASE) 50 MCG/ACT nasal spray    gabapentin (NEURONTIN) 300 MG capsule TAKE 2 CAPSULES (600 MG  TOTAL) BY MOUTH 3 (THREE) TIMES DAILY.   glipiZIDE (GLUCOTROL  XL) 5 MG 24 hr tablet TAKE 2 TABLETS BY MOUTH IN THE AM AND 1 TABLET BEFORE DINNER   glucose blood (ONE TOUCH ULTRA TEST) test strip Use as instructed to check sugar 2 times daily   indomethacin (INDOCIN) 50 MG capsule Take 1 capsule (50 mg total) by mouth 2 (two) times daily with a meal.   ketoconazole (NIZORAL) 2 % cream    latanoprost (XALATAN) 0.005 % ophthalmic solution Place 1 drop into both eyes at bedtime.    lisinopril (PRINIVIL,ZESTRIL) 20 MG tablet Take 10 mg by mouth daily.    metFORMIN (GLUCOPHAGE) 1000 MG tablet Take 1,000 mg by mouth 2 (two) times daily with a meal.     Misc Natural Products (GLUCOSAMINE CHONDROITIN ADV PO) Take 2 tablets by mouth 2 (two) times daily.   Multiple Vitamin (MULTIVITAMIN) tablet Take 1 tablet by mouth daily.     mupirocin ointment (BACTROBAN) 2 % Apply to wound twice a day.   Omega-3 Fatty Acids (FISH OIL) 1200 MG CAPS    pegloticase (KRYSTEXXA) 8 MG/ML injection Inject into the vein every 14 (fourteen) days.   pravastatin (PRAVACHOL) 20 MG tablet Take 10 mg by mouth daily.    sildenafil (REVATIO) 20 MG tablet TAKE 3 TO 5 TABLETS BY MOUTH ONCE DAILY AS NEEDED   sildenafil (VIAGRA) 50 MG tablet    triamcinolone ointment (KENALOG) 0.5 % Apply 1 application topically 2 (two) times daily.   ULORIC 40 MG tablet Take 40 mg by mouth daily.   Vitamin D, Cholecalciferol, 1000 UNITS CAPS Take 1 capsule by mouth daily.   No facility-administered encounter medications on file as of 07/18/2018.

## 2018-07-23 DIAGNOSIS — M2011 Hallux valgus (acquired), right foot: Secondary | ICD-10-CM | POA: Diagnosis not present

## 2018-07-23 DIAGNOSIS — M1990 Unspecified osteoarthritis, unspecified site: Secondary | ICD-10-CM | POA: Diagnosis not present

## 2018-07-23 DIAGNOSIS — M25532 Pain in left wrist: Secondary | ICD-10-CM | POA: Diagnosis not present

## 2018-07-23 DIAGNOSIS — M1A9XX1 Chronic gout, unspecified, with tophus (tophi): Secondary | ICD-10-CM | POA: Diagnosis not present

## 2018-07-23 DIAGNOSIS — E119 Type 2 diabetes mellitus without complications: Secondary | ICD-10-CM | POA: Diagnosis not present

## 2018-07-29 DIAGNOSIS — M1A9XX1 Chronic gout, unspecified, with tophus (tophi): Secondary | ICD-10-CM | POA: Diagnosis not present

## 2018-08-01 ENCOUNTER — Encounter: Payer: Self-pay | Admitting: Family Medicine

## 2018-08-01 ENCOUNTER — Ambulatory Visit (INDEPENDENT_AMBULATORY_CARE_PROVIDER_SITE_OTHER): Payer: Medicare Other | Admitting: Family Medicine

## 2018-08-01 ENCOUNTER — Other Ambulatory Visit: Payer: Self-pay

## 2018-08-01 VITALS — BP 100/60 | HR 80 | Temp 97.6°F | Ht 71.0 in | Wt 228.5 lb

## 2018-08-01 DIAGNOSIS — S6992XD Unspecified injury of left wrist, hand and finger(s), subsequent encounter: Secondary | ICD-10-CM

## 2018-08-01 DIAGNOSIS — S6992XA Unspecified injury of left wrist, hand and finger(s), initial encounter: Secondary | ICD-10-CM

## 2018-08-01 DIAGNOSIS — S62329D Displaced fracture of shaft of unspecified metacarpal bone, subsequent encounter for fracture with routine healing: Secondary | ICD-10-CM

## 2018-08-01 NOTE — Patient Instructions (Signed)
Wean out of brace over the next week  Work on range of motion throughout the day  Work on grip strength: Stress ball, tennis ball, racquet ball Towel grip back and forth - grip with hands

## 2018-08-01 NOTE — Progress Notes (Signed)
Lucas Gallery T. Tucker Minter, MD [facility] [place], [address] Phone: [phone]   FAX: [phone]  Lucas [name] - 78 y.o. male   MRN [id number]   Date of Birth: [birth year]  Visit Date: 08/01/2018   PCP: [name], MD   Referred by: [name], MD  Chief Complaint  Patient presents with   Follow-up    Left hand injury   Subjective:   Lucas [name] is a 78 y.o. very pleasant male patient who presents with the following:  Doing much better.  I had him in a forearm wrist splint, and he is done quite well.  His swelling is essentially completely resolved.  His range of motion is very good.  He has minimal pain at this point.  He does have some decrease in his grip strength.  Work on [employer]  Past Medical History, Surgical History, Social History, Family History, Problem List, Medications, and Allergies have been reviewed and updated if relevant.  Patient Active Problem List   Diagnosis Date Noted   Obesity, Class I, BMI 30-34.9 12/07/2017   Skin ulcer of right great toe ([place]) 05/04/2017   Acute gout involving toe of right foot 07/17/2016   DDD (degenerative disc disease), lumbar 05/16/2016   Type 2 diabetes mellitus with hyperglycemia, without long-term current use of insulin ([place]) 07/28/2015   Advance directive discussed with patient 07/28/2015   Elevated rheumatoid factor 07/08/2015   Gouty arthropathy, chronic, without tophi 07/08/2015   Hypertension 07/08/2015   Obstructive sleep apnea 07/22/2014   Routine general medical examination at a health care facility 06/06/2011   BPH with obstruction/lower urinary tract symptoms    Generalized osteoarthritis of multiple sites 09/20/2006   Hyperlipidemia 09/17/2006   GLAUCOMA 09/17/2006   ALLERGIC RHINITIS 09/17/2006   DIVERTICULOSIS, COLON 09/17/2006    Past Medical History:  Diagnosis Date   Allergic  rhinitis    Diabetes mellitus type II    Diverticulosis of colon    Glaucoma    History of agent Orange exposure    HLD (hyperlipidemia)    HTN (hypertension)    Hypertrophy of prostate without urinary obstruction and other lower urinary tract symptoms (LUTS)    Osteoarthritis     Past Surgical History:  Procedure Laterality Date   TOOTH EXTRACTION      Social History   Socioeconomic History   Marital status: Married    Spouse name: Not on file   Number of children: Not on file   Years of education: Not on file   Highest education level: Not on file  Occupational History   Occupation: Retired-[employer]  Social Designer, fashion/clothing strain: Not on file   Food insecurity:    Worry: Not on file    Inability: Not on file   Transportation needs:    Medical: Not on file    Non-medical: Not on file  Tobacco Use   Smoking status: Never Smoker   Smokeless tobacco: Never Used  Substance and Sexual Activity   Alcohol use: No    Alcohol/week: 0.0 standard drinks   Drug use: No   Sexual activity: Not on file  Lifestyle   Physical activity:    Days per week: Not on file    Minutes per session: Not on file   Stress: Not on file  Relationships   Social connections:    Talks on phone: Not on file  Gets together: Not on file    Attends religious service: Not on file    Active member of club or organization: Not on file    Attends meetings of clubs or organizations: Not on file    Relationship status: Not on file   Intimate partner violence:    Fear of current or ex partner: Not on file    Emotionally abused: Not on file    Physically abused: Not on file    Forced sexual activity: Not on file  Other Topics Concern   Not on file  Social History Narrative   [place] vet- 20% disability from shrapnel wounds 1967 and 20% agent orange   Married (Widowed 1996; remarried 6/03)   Retired [employer]      Has living will   Wife, then daughter Hoyle Barr will  be health care POA   Would want attempts at resuscitation   Would not want feeding tube if cognitively unaware    Family History  Problem Relation Age of Onset   Colon cancer Father    Stroke Mother    Heart attack Mother    Coronary artery disease Mother    Hypertension Unknown        several siblings   Diabetes Unknown        several siblings   Coronary artery disease Brother    ALS Sister     No Known Allergies  Medication list reviewed and updated in full in [place].  GEN: No fevers, chills. Nontoxic. Primarily MSK c/o today. MSK: Detailed in the HPI GI: tolerating PO intake without difficulty Neuro: No numbness, parasthesias, or tingling associated. Otherwise the pertinent positives of the ROS are noted above.   Objective:   BP 100/60    Pulse 80    Temp 97.6 F (36.4 C) (Oral)    Ht 5\' 11"  (1.803 m)    Wt 228 lb 8 oz (103.6 kg)    BMI 31.87 kg/m    GEN: WDWN, NAD, Non-toxic, Alert & Oriented x 3 HEENT: Atraumatic, Normocephalic.  Ears and Nose: No external deformity. EXTR: No clubbing/cyanosis/edema NEURO: Normal gait.  PSYCH: Normally interactive. Conversant. Not depressed or anxious appearing.  Calm demeanor.    At this point, approaching full range of motion compared to the contralateral side.  Nontender throughout all bony anatomy in the wrist and hand.  Grip strength is diminished on the left compared to the right.  Radiology: Dg Wrist Complete Left  Result Date: 07/18/2018 CLINICAL DATA:  78 year old male with a history of fall and left hand injury EXAM: LEFT HAND - COMPLETE 3+ VIEW; LEFT WRIST - COMPLETE 3+ VIEW COMPARISON:  None. FINDINGS: Hand: Lateral view of the hand demonstrates soft tissue swelling on the dorsum of the hand. Tiny geometric calcific density identified on the dorsum of the hand, at the proximal metacarpals, seen only on the oblique image. No subluxation/dislocation. Mild degenerative changes of the interphalangeal  joints. No erosive changes. Wrist: Carpal bones maintain alignment. Unremarkable scaphoid. Soft tissue swelling on the dorsum at the base of the hand. IMPRESSION: Tiny calcific density in the dorsal soft tissues of the hand with associated soft tissue swelling overlying the proximal metacarpals, presumably a small chip fracture/avulsion fracture. If there was a history of penetrating injury, this could potentially represent a small radiopaque foreign body. Electronically Signed   By: Corrie Mckusick D.O.   On: 07/18/2018 12:39   Dg Hand Complete Left  Result Date: 07/18/2018 CLINICAL DATA:  78 year old male  with a history of fall and left hand injury EXAM: LEFT HAND - COMPLETE 3+ VIEW; LEFT WRIST - COMPLETE 3+ VIEW COMPARISON:  None. FINDINGS: Hand: Lateral view of the hand demonstrates soft tissue swelling on the dorsum of the hand. Tiny geometric calcific density identified on the dorsum of the hand, at the proximal metacarpals, seen only on the oblique image. No subluxation/dislocation. Mild degenerative changes of the interphalangeal joints. No erosive changes. Wrist: Carpal bones maintain alignment. Unremarkable scaphoid. Soft tissue swelling on the dorsum at the base of the hand. IMPRESSION: Tiny calcific density in the dorsal soft tissues of the hand with associated soft tissue swelling overlying the proximal metacarpals, presumably a small chip fracture/avulsion fracture. If there was a history of penetrating injury, this could potentially represent a small radiopaque foreign body. Electronically Signed   By: Corrie Mckusick D.O.   On: 07/18/2018 12:39    Assessment and Plan:   Closed avulsion fracture of shaft of metacarpal bone with routine healing, subsequent encounter  Hand injury, left, initial encounter   Patient Instructions  Wean out of brace over the next week  Work on range of motion throughout the day  Work on grip strength: Stress ball, tennis ball, racquet ball Towel grip back  and forth - grip with hands    Follow-up: No follow-ups on file.  Signed,  Maud Deed. Sakeenah Valcarcel, MD   Outpatient Encounter Medications as of 08/01/2018  Medication Sig   albuterol (VENTOLIN HFA) 108 (90 BASE) MCG/ACT inhaler Inhale 2 puffs into the lungs every 4 (four) hours as needed.     aspirin 81 MG tablet Take 81 mg by mouth daily.     brimonidine (ALPHAGAN) 0.2 % ophthalmic solution Place 1 drop into both eyes 2 (two) times daily.    cetirizine (ZYRTEC) 10 MG tablet Take 10 mg by mouth daily.   colchicine 0.6 MG tablet Take 0.6 mg by mouth as needed. Take 2 tablets by mouth at first sign of gout flare followed by 1 tablet after 1 hour. Next day 1 tab;et for 5 days   COMBIGAN 0.2-0.5 % ophthalmic solution    Dulaglutide (TRULICITY) 1.5 ZJ/6.9CV SOPN INJECT ONE PEN (1.5 MG TOTAL) UNDER SKIN ON SUNDAYS   fluocinonide cream (LIDEX) 0.05 % APPLY TWICE DAILY TO AFFECTED AREAS OF LEGS&ARMS UNTIL CLEAR,THEN AS NEEDED. AVOID FACE,GROIN,AXILLA   fluticasone (FLONASE) 50 MCG/ACT nasal spray    gabapentin (NEURONTIN) 300 MG capsule TAKE 2 CAPSULES (600 MG TOTAL) BY MOUTH 3 (THREE) TIMES DAILY.   glipiZIDE (GLUCOTROL XL) 5 MG 24 hr tablet TAKE 2 TABLETS BY MOUTH IN THE AM AND 1 TABLET BEFORE DINNER   glucose blood (ONE TOUCH ULTRA TEST) test strip Use as instructed to check sugar 2 times daily   indomethacin (INDOCIN) 50 MG capsule Take 1 capsule (50 mg total) by mouth 2 (two) times daily with a meal.   ketoconazole (NIZORAL) 2 % cream    latanoprost (XALATAN) 0.005 % ophthalmic solution Place 1 drop into both eyes at bedtime.    lisinopril (PRINIVIL,ZESTRIL) 20 MG tablet Take 10 mg by mouth daily.    metFORMIN (GLUCOPHAGE) 1000 MG tablet Take 1,000 mg by mouth 2 (two) times daily with a meal.     Misc Natural Products (GLUCOSAMINE CHONDROITIN ADV PO) Take 2 tablets by mouth 2 (two) times daily.   Multiple Vitamin (MULTIVITAMIN) tablet Take 1 tablet by mouth daily.      mupirocin ointment (BACTROBAN) 2 % Apply to wound twice a day.  Omega-3 Fatty Acids (FISH OIL) 1200 MG CAPS    pegloticase (KRYSTEXXA) 8 MG/ML injection Inject into the vein every 14 (fourteen) days.   pravastatin (PRAVACHOL) 20 MG tablet Take 10 mg by mouth daily.    sildenafil (REVATIO) 20 MG tablet TAKE 3 TO 5 TABLETS BY MOUTH ONCE DAILY AS NEEDED   sildenafil (VIAGRA) 50 MG tablet    triamcinolone ointment (KENALOG) 0.5 % Apply 1 application topically 2 (two) times daily.   ULORIC 40 MG tablet Take 40 mg by mouth daily.   Vitamin D, Cholecalciferol, 1000 UNITS CAPS Take 1 capsule by mouth daily.   No facility-administered encounter medications on file as of 08/01/2018.

## 2018-08-14 ENCOUNTER — Telehealth: Payer: Self-pay

## 2018-08-14 NOTE — Telephone Encounter (Signed)
LM for patient to return our call to see if we can get him scheduled this month for his follow up, virtual or phone.

## 2018-08-15 ENCOUNTER — Telehealth: Payer: Self-pay | Admitting: Internal Medicine

## 2018-08-15 NOTE — Telephone Encounter (Signed)
I left a message on patient's voice mail to call back and change appointment to [email or web address].

## 2018-08-23 ENCOUNTER — Encounter: Payer: Self-pay | Admitting: Internal Medicine

## 2018-08-23 ENCOUNTER — Ambulatory Visit (INDEPENDENT_AMBULATORY_CARE_PROVIDER_SITE_OTHER): Payer: Medicare Other | Admitting: Internal Medicine

## 2018-08-23 VITALS — BP 118/60 | HR 75 | Temp 97.6°F | Ht 71.0 in | Wt 225.0 lb

## 2018-08-23 DIAGNOSIS — M1A00X0 Idiopathic chronic gout, unspecified site, without tophus (tophi): Secondary | ICD-10-CM | POA: Diagnosis not present

## 2018-08-23 DIAGNOSIS — E1140 Type 2 diabetes mellitus with diabetic neuropathy, unspecified: Secondary | ICD-10-CM | POA: Diagnosis not present

## 2018-08-23 DIAGNOSIS — Z0000 Encounter for general adult medical examination without abnormal findings: Secondary | ICD-10-CM

## 2018-08-23 DIAGNOSIS — N138 Other obstructive and reflux uropathy: Secondary | ICD-10-CM | POA: Diagnosis not present

## 2018-08-23 DIAGNOSIS — I10 Essential (primary) hypertension: Secondary | ICD-10-CM | POA: Diagnosis not present

## 2018-08-23 DIAGNOSIS — Z7189 Other specified counseling: Secondary | ICD-10-CM | POA: Diagnosis not present

## 2018-08-23 DIAGNOSIS — N401 Enlarged prostate with lower urinary tract symptoms: Secondary | ICD-10-CM

## 2018-08-23 NOTE — Assessment & Plan Note (Signed)
I have personally reviewed the Medicare Annual Wellness questionnaire and have noted 1. The patient's medical and social history 2. Their use of alcohol, tobacco or illicit drugs 3. Their current medications and supplements 4. The patient's functional ability including ADL's, fall risks, home safety risks and hearing or visual             impairment. 5. Diet and physical activities 6. Evidence for depression or mood disorders  The patients weight, height, BMI and visual acuity have been recorded in the chart I have made referrals, counseling and provided education to the patient based review of the above and I have provided the pt with a written personalized care plan for preventive services.  I have provided you with a copy of your personalized plan for preventive services. Please take the time to review along with your updated medication list.  Yearly flu vaccine Reviewed continued social distancing for the forseeable future No cancer screening now due to age Discussed healthy lifestyle

## 2018-08-23 NOTE — Assessment & Plan Note (Signed)
See social history 

## 2018-08-23 NOTE — Assessment & Plan Note (Signed)
Lab Results  Component Value Date   HGBA1C 7.9 (A) 03/18/2018   Will see Dr Cruzita Lederer soon for recheck Gabapentin helps the neuropathy

## 2018-08-23 NOTE — Assessment & Plan Note (Signed)
Symptomatic but not troubling

## 2018-08-23 NOTE — Assessment & Plan Note (Signed)
Better after infusions and maintenance medications

## 2018-08-23 NOTE — Assessment & Plan Note (Signed)
BP Readings from Last 3 Encounters:  08/23/18 118/60  08/01/18 100/60  07/18/18 100/60   Good control On ACEI Recent labs from rheumatologist reviewed and okay

## 2018-08-23 NOTE — Progress Notes (Signed)
Hearing Screening Comments: Has hearing aids. Vision Screening Comments: 06/26/18

## 2018-08-23 NOTE — Progress Notes (Signed)
Subjective:    Patient ID: Lucas [name], male    DOB: 28-May-1940, 78 y.o.   MRN: [id number]  HPI Virtual visit for Medicare wellness and review of chronic health conditions Identification done Reviewed billing and he gave consent He is in his home, and I am in my office  Has had a lot of doctor visits in the past year Related to gout--and need for skin graft (Dr Rosita Fire) Got infusions for the uric acid Ulcer is healed  Also had left wrist injury-sprain Is improved now  Checks sugars regularly Will be seeing endocrine next month Readings have been high---170's lately Usually 120-170 Feet remain numb---plans to see podiatrist soon Still on the gabapentin Wears compression hose at times  Voids okay Still goes often---flow is stop and go He does feel that he is able to empty his bladder (for the most part) No regular nocturia  Ongoing back and knee stiffness---not that much pain Uses tylenol prn only  No hospitalizations in the past year. Only surgery was skin graft No tobacco products No alcohol Inconsistent with exercise -- COVID affecting him Still with some vision loss in left eye New hearing aides--they help  [place] [address] a month ago---wrist injury. One other fall without injury No depression or anhedonia Independent with instrumental ADLs No significant memory issues Other doctors---- Dr Lennox Laity, Dr Jobie Quaker, Dr Edwyna Ready, Dr Reis--rheumatologist, Dr Hyatt--podiatrist, Dr Maryellen Pile  Current Outpatient Medications on File Prior to Visit  Medication Sig Dispense Refill  . albuterol (VENTOLIN HFA) 108 (90 BASE) MCG/ACT inhaler Inhale 2 puffs into the lungs every 4 (four) hours as needed.      Marland Kitchen aspirin 81 MG tablet Take 81 mg by mouth daily.      . brimonidine (ALPHAGAN) 0.2 % ophthalmic solution Place 1 drop into both eyes 2 (two) times daily.     . cetirizine (ZYRTEC) 10 MG tablet Take 10 mg by mouth daily.    . colchicine 0.6 MG tablet Take  0.6 mg by mouth as needed. Take 2 tablets by mouth at first sign of gout flare followed by 1 tablet after 1 hour. Next day 1 tab;et for 5 days    . COMBIGAN 0.2-0.5 % ophthalmic solution     . Dulaglutide (TRULICITY) 1.5 NT/6.1WE SOPN INJECT ONE PEN (1.5 MG TOTAL) UNDER SKIN ON SUNDAYS 12 pen 3  . fluocinonide cream (LIDEX) 0.05 % APPLY TWICE DAILY TO AFFECTED AREAS OF LEGS&ARMS UNTIL CLEAR,THEN AS NEEDED. AVOID FACE,GROIN,AXILLA  0  . fluticasone (FLONASE) 50 MCG/ACT nasal spray     . gabapentin (NEURONTIN) 300 MG capsule TAKE 2 CAPSULES (600 MG TOTAL) BY MOUTH 3 (THREE) TIMES DAILY. 540 capsule 1  . glipiZIDE (GLUCOTROL XL) 5 MG 24 hr tablet TAKE 2 TABLETS BY MOUTH IN THE AM AND 1 TABLET BEFORE DINNER 270 tablet 3  . glucose blood (ONE TOUCH ULTRA TEST) test strip Use as instructed to check sugar 2 times daily 200 each 5  . indomethacin (INDOCIN) 50 MG capsule Take 1 capsule (50 mg total) by mouth 2 (two) times daily with a meal. 60 capsule 1  . ketoconazole (NIZORAL) 2 % cream     . latanoprost (XALATAN) 0.005 % ophthalmic solution Place 1 drop into both eyes at bedtime.     Marland Kitchen lisinopril (PRINIVIL,ZESTRIL) 20 MG tablet Take 10 mg by mouth daily.     . metFORMIN (GLUCOPHAGE) 1000 MG tablet Take 1,000 mg by mouth 2 (two) times daily with a meal.      .  Misc Natural Products (GLUCOSAMINE CHONDROITIN ADV PO) Take 2 tablets by mouth 2 (two) times daily.    . Multiple Vitamin (MULTIVITAMIN) tablet Take 1 tablet by mouth daily.      . mupirocin ointment (BACTROBAN) 2 % Apply to wound twice a day. 30 g 2  . Omega-3 Fatty Acids (FISH OIL) 1200 MG CAPS     . pravastatin (PRAVACHOL) 20 MG tablet Take 10 mg by mouth daily.     . sildenafil (REVATIO) 20 MG tablet TAKE 3 TO 5 TABLETS BY MOUTH ONCE DAILY AS NEEDED 50 tablet 11  . sildenafil (VIAGRA) 50 MG tablet     . triamcinolone ointment (KENALOG) 0.5 % Apply 1 application topically 2 (two) times daily. 30 g 0  . ULORIC 40 MG tablet Take 40 mg by mouth  daily.  4  . Vitamin D, Cholecalciferol, 1000 UNITS CAPS Take 1 capsule by mouth daily.     No current facility-administered medications on file prior to visit.     No Known Allergies  Past Medical History:  Diagnosis Date  . Allergic rhinitis   . Diabetes mellitus type II   . Diverticulosis of colon   . Glaucoma   . History of agent Orange exposure   . HLD (hyperlipidemia)   . HTN (hypertension)   . Hypertrophy of prostate without urinary obstruction and other lower urinary tract symptoms (LUTS)   . Osteoarthritis     Past Surgical History:  Procedure Laterality Date  . TOOTH EXTRACTION      Family History  Problem Relation Age of Onset  . Colon cancer Father   . Stroke Mother   . Heart attack Mother   . Coronary artery disease Mother   . Hypertension Other        several siblings  . Diabetes Other        several siblings  . Coronary artery disease Brother   . ALS Sister     Social History   Socioeconomic History  . Marital status: Married    Spouse name: Not on file  . Number of children: Not on file  . Years of education: Not on file  . Highest education level: Not on file  Occupational History  . Occupation: Retired-[employer]  Social Needs  . Financial resource strain: Not on file  . Food insecurity:    Worry: Not on file    Inability: Not on file  . Transportation needs:    Medical: Not on file    Non-medical: Not on file  Tobacco Use  . Smoking status: Never Smoker  . Smokeless tobacco: Never Used  Substance and Sexual Activity  . Alcohol use: No    Alcohol/week: 0.0 standard drinks  . Drug use: No  . Sexual activity: Not on file  Lifestyle  . Physical activity:    Days per week: Not on file    Minutes per session: Not on file  . Stress: Not on file  Relationships  . Social connections:    Talks on phone: Not on file    Gets together: Not on file    Attends religious service: Not on file    Active member of club or organization: Not on file     Attends meetings of clubs or organizations: Not on file    Relationship status: Not on file  . Intimate partner violence:    Fear of current or ex partner: Not on file    Emotionally abused: Not on file  Physically abused: Not on file    Forced sexual activity: Not on file  Other Topics Concern  . Not on file  Social History Narrative   [place] vet- 20% disability from shrapnel wounds 1967 and 20% agent orange   Married (Widowed 1996; remarried 6/03)   Retired [place] living will   Wife, then daughter Hoyle Barr will be health care POA   Would want attempts at resuscitation   Would not want feeding tube if cognitively unaware   Review of Systems Appetite is fine--taste is off though Weight is fairly stable Sleeps okay Wears seat belt Bowels slow at times--- takes miralax prn. No blood No new skin lesions of concern--just dry skin No heartburn or dysphagia    Objective:   Physical Exam  Constitutional: He is oriented to person, place, and time. He appears well-developed. No distress.  Respiratory: Effort normal. No respiratory distress.  Musculoskeletal:        General: No edema.  Neurological: He is alert and oriented to person, place, and time.  President--- "[name], [name]" [id number] Recall 3/3  Skin: No rash noted.  No foot lesions  Psychiatric: He has a normal mood and affect. His behavior is normal.           Assessment & Plan:

## 2018-09-04 ENCOUNTER — Other Ambulatory Visit: Payer: Self-pay | Admitting: Internal Medicine

## 2018-09-18 DIAGNOSIS — H401232 Low-tension glaucoma, bilateral, moderate stage: Secondary | ICD-10-CM | POA: Diagnosis not present

## 2018-09-18 DIAGNOSIS — E119 Type 2 diabetes mellitus without complications: Secondary | ICD-10-CM | POA: Diagnosis not present

## 2018-09-20 ENCOUNTER — Ambulatory Visit (INDEPENDENT_AMBULATORY_CARE_PROVIDER_SITE_OTHER): Payer: Medicare Other | Admitting: Internal Medicine

## 2018-09-20 ENCOUNTER — Encounter: Payer: Self-pay | Admitting: Internal Medicine

## 2018-09-20 ENCOUNTER — Other Ambulatory Visit: Payer: Self-pay

## 2018-09-20 DIAGNOSIS — E785 Hyperlipidemia, unspecified: Secondary | ICD-10-CM

## 2018-09-20 DIAGNOSIS — E669 Obesity, unspecified: Secondary | ICD-10-CM

## 2018-09-20 DIAGNOSIS — E1140 Type 2 diabetes mellitus with diabetic neuropathy, unspecified: Secondary | ICD-10-CM

## 2018-09-20 DIAGNOSIS — IMO0002 Reserved for concepts with insufficient information to code with codable children: Secondary | ICD-10-CM

## 2018-09-20 DIAGNOSIS — E1165 Type 2 diabetes mellitus with hyperglycemia: Secondary | ICD-10-CM | POA: Diagnosis not present

## 2018-09-20 NOTE — Patient Instructions (Addendum)
Please continue: - Metformin 2000 mg with dinner - Trulicity 1.5 mg weekly - Glipizide XL 10 mg in a.m. and 5 mg before dinner  Please cut out snacks after dinner.  Please return in 3 months with your sugar log.

## 2018-09-20 NOTE — Progress Notes (Addendum)
Patient ID: Lucas [name], male   DOB: January 06, 1941, 78 y.o.   MRN: [id number]  Patient location: [facility] My location: Office  Referring Provider: [name], MD  I connected with the patient on 09/20/18 at 10:09 AM EDT by a video enabled telemedicine application and verified that I am speaking with the correct person.   I discussed the limitations of evaluation and management by telemedicine and the availability of in person appointments. The patient expressed understanding and agreed to proceed.   Details of the encounter are shown below.  HPI: FAHED [name] is a 78 y.o.-year-old male, presenting for f/u for DM2, dx in 1998, non-insulin-dependent (previous Agent Orange exposure in [place]), uncontrolled, with complications (mild CKD, foot ulcer). Last visit 6 mo ago.  He could not exercise when I last saw him >> now he can (foot out of boot) but did not start yet.   Last hemoglobin A1c was: Lab Results  Component Value Date   HGBA1C 7.9 (A) 03/18/2018   HGBA1C 7.6 (A) 12/07/2017   HGBA1C 7.8 08/06/2017  07/13/2016: HbA1c 8.0% 04/15/2015: 7.6%  Pt is on a regimen of: - Metformin 2000 mg with dinner - Trulicity 1.5 mg weekly - Glipizide XL 10 mg in a.m. and 5 mg before dinner He had to stop [place] as K increased (took 1 mo, off for 1.5 mo). Invokana was not covered.  Pt checks his sugars 1-2 times a day: - am: 139-167, 203, 245 >> 128, 146-197, 238 >> 100, 143-189, 196 - 2h after b'fast:189 >> n/c >> 312 >> n/c - before lunch: 98, 117,140s >> 83-155 >> 85-167, 182 - 2h after lunch: 116, 140 >> n/c - before dinner:  89, 150-189 >> 97, 101-152 >> 79, 95-179, 219 - 2h after dinner:n/c >> 139 >> n/c  >> 160 - bedtime:  87 >> n/c >> 78, 82, 137, 181 >> 109-130 - nighttime: n/c >> 165 >> n/c Lowest sugar was 86 >> 79; he has hypoglycemia awareness in the 70s. Highest sugar was 238 >> 219.  Glucometer: Precision >> One Touch Ultra mini >> [place]  Pt's meals are: -  Breakfast:  Oatmeal, wheat English muffin  - Lunch: salad, 1/2 sandwich - Dinner: meat + veggies, added carbs back - Snacks: 2x a day, fruit bar, fresh fruit  + Mild CKD, last BUN/creatinine:  Lab Results  Component Value Date   BUN 15 08/20/2017   CREATININE 0.99 08/20/2017  04/16/2017: 15/1.2 07/13/2016: BUN/Cr 13/1.0, Glu 116, UACR 0.5 11/10/2015: EGFR 80, creatinine 1.1 04/15/2015: GFR 88.1, Cr 1.06 On lisinopril.  + HL; last set of lipids: Lab Results  Component Value Date   CHOL 175 08/20/2017   HDL 37.20 (L) 08/20/2017   LDLCALC 104 07/13/2016   LDLDIRECT 96.0 08/20/2017   TRIG 329.0 (H) 08/20/2017   CHOLHDL 5 08/20/2017  04/16/2016: 179/288/44/77 04/15/2015: LDL 89 On pravastatin. - last dilated eye exam was in 09/2018: No DR.  He has glaucoma. 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 - no numbness and tingling in his feet.  He was in a DM clinical trial before: "Jump Start" lifestyle Research Trial.  He was on the [employer] and lifestyle program in the past.  He also has a history of HTN, ED. He has gout - on Alopurinol >> Uloric (he had a rash with allopurinol)  He had a R foot ulcer 04/2017 and again in 11/2017. He is seen by a vascular dr.  Lynnell Dike were normal.  In 11/2017 he was getting Krysexxa injections for gout.  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  ROS: .Constitutional: no weight gain/+ weight loss, no fatigue, no subjective hyperthermia, no subjective hypothermia Eyes: + blurry vision, no xerophthalmia ENT: no sore throat, no nodules palpated in neck, no dysphagia, no odynophagia, no hoarseness Cardiovascular: no CP/no SOB/no palpitations/no leg swelling Respiratory: no cough/no SOB/no wheezing Gastrointestinal: no N/no V/no D/no C/no acid reflux Musculoskeletal: no muscle aches/no joint aches Skin: no rashes, no hair loss Neurological: no tremors/no numbness/no tingling/no dizziness  I reviewed pt's  medications, allergies, PMH, social hx, family hx, and changes were documented in the history of present illness. Otherwise, unchanged from my initial visit note.   Past Medical History:  Diagnosis Date  . Allergic rhinitis   . Diabetes mellitus type II   . Diverticulosis of colon   . Glaucoma   . History of agent Orange exposure   . HLD (hyperlipidemia)   . HTN (hypertension)   . Hypertrophy of prostate without urinary obstruction and other lower urinary tract symptoms (LUTS)   . Osteoarthritis    Past Surgical History:  Procedure Laterality Date  . TOOTH EXTRACTION       History   Social History  . Marital Status: Married    Spouse Name: N/A  . Number of Children: 2   Occupational History  . Retired-[employer]    Social History Main Topics  . Smoking status: Never Smoker   . Smokeless tobacco: Never Used  . Alcohol Use: No  . Drug Use: No    [place] vet- 20% disability from shrapnel wounds 1967 and 20% agent orange   Married (Widowed 1996; remarried 6/03)   Retired [employer]      Has living will   Wife, then daughter Hoyle Barr will be health care POA   Would want attempts at resuscitation   Would not want feeding tube if cognitively unaware   Current Outpatient Medications on File Prior to Visit  Medication Sig Dispense Refill  . albuterol (VENTOLIN HFA) 108 (90 BASE) MCG/ACT inhaler Inhale 2 puffs into the lungs every 4 (four) hours as needed.      Marland Kitchen aspirin 81 MG tablet Take 81 mg by mouth daily.      . brimonidine (ALPHAGAN) 0.2 % ophthalmic solution Place 1 drop into both eyes 2 (two) times daily.     . cetirizine (ZYRTEC) 10 MG tablet Take 10 mg by mouth daily.    . colchicine 0.6 MG tablet Take 0.6 mg by mouth as needed. Take 2 tablets by mouth at first sign of gout flare followed by 1 tablet after 1 hour. Next day 1 tab;et for 5 days    . COMBIGAN 0.2-0.5 % ophthalmic solution     . Dulaglutide (TRULICITY) 1.5 BB/0.4UG SOPN INJECT ONE PEN (1.5 MG TOTAL) UNDER SKIN ON  SUNDAYS 12 pen 3  . fluocinonide cream (LIDEX) 0.05 % APPLY TWICE DAILY TO AFFECTED AREAS OF LEGS&ARMS UNTIL CLEAR,THEN AS NEEDED. AVOID FACE,GROIN,AXILLA  0  . fluticasone (FLONASE) 50 MCG/ACT nasal spray     . gabapentin (NEURONTIN) 300 MG capsule TAKE 2 CAPSULES (600 MG TOTAL) BY MOUTH 3 (THREE) TIMES DAILY. 540 capsule 3  . glipiZIDE (GLUCOTROL XL) 5 MG 24 hr tablet TAKE 2 TABLETS BY MOUTH IN THE AM AND 1 TABLET BEFORE DINNER 270 tablet 3  . glucose blood (ONE TOUCH ULTRA TEST) test strip Use as instructed to check sugar 2  times daily 200 each 5  . indomethacin (INDOCIN) 50 MG capsule Take 1 capsule (50 mg total) by mouth 2 (two) times daily with a meal. 60 capsule 1  . ketoconazole (NIZORAL) 2 % cream     . latanoprost (XALATAN) 0.005 % ophthalmic solution Place 1 drop into both eyes at bedtime.     Marland Kitchen lisinopril (PRINIVIL,ZESTRIL) 20 MG tablet Take 10 mg by mouth daily.     . metFORMIN (GLUCOPHAGE) 1000 MG tablet Take 1,000 mg by mouth 2 (two) times daily with a meal.      . Misc Natural Products (GLUCOSAMINE CHONDROITIN ADV PO) Take 2 tablets by mouth 2 (two) times daily.    . Multiple Vitamin (MULTIVITAMIN) tablet Take 1 tablet by mouth daily.      . mupirocin ointment (BACTROBAN) 2 % Apply to wound twice a day. 30 g 2  . Omega-3 Fatty Acids (FISH OIL) 1200 MG CAPS     . pravastatin (PRAVACHOL) 20 MG tablet Take 10 mg by mouth daily.     . sildenafil (REVATIO) 20 MG tablet TAKE 3 TO 5 TABLETS BY MOUTH ONCE DAILY AS NEEDED 50 tablet 11  . sildenafil (VIAGRA) 50 MG tablet     . triamcinolone ointment (KENALOG) 0.5 % Apply 1 application topically 2 (two) times daily. 30 g 0  . ULORIC 40 MG tablet Take 40 mg by mouth daily.  4  . Vitamin D, Cholecalciferol, 1000 UNITS CAPS Take 1 capsule by mouth daily.     No current facility-administered medications on file prior to visit.    No Known Allergies Family History  Problem Relation Age of Onset  . Colon cancer Father   . Stroke Mother    . Heart attack Mother   . Coronary artery disease Mother   . Hypertension Other        several siblings  . Diabetes Other        several siblings  . Coronary artery disease Brother   . ALS Sister    PE: Today: 127/70, HR 79, temp 97.61F, weight 219 lb There were no vitals taken for this visit. There is no height or weight on file to calculate BMI. Wt Readings from Last 3 Encounters:  08/23/18 225 lb (102.1 kg)  08/01/18 228 lb 8 oz (103.6 kg)  07/18/18 227 lb 8 oz (103.2 kg)   Constitutional:  in NAD  The physical exam was not performed (virtual visit).  ASSESSMENT: 1. DM2, non-insulin-dependent, uncontrolled, with complications - R foot ulcer. Nl ABIs - mild CKD  2. HL  3.  Obesity  PLAN:  1. Patient with longstanding, uncontrolled, type 2 diabetes, on oral antidiabetic regimen and weekly GLP-1 receptor agonist.  Last visit, HbA1c was higher, at 7.9%.  He was planning to restart exercise and also planning to reduce carbs with dinner.  We discussed at last visit about starting long-acting insulin but he refused.  His sugars at that time were higher in the morning. - at this visit, sugars are little higher than before and still above goal.  She again refuses long-acting insulin so we discussed about the fact that we do not have many options to improve his morning sugars.  As of now, he is eating snacks after dinner and I strongly advised him to stop these.  He has dinner between 6 and 7 PM and I advised him to maybe move this a little later, after 7 so he is not hungry in the evening.  At next  visit, if sugars are not better, he agrees to start a low-dose long-acting insulin. - I suggested to:  Patient Instructions  Please continue: - Metformin 2000 mg with dinner - Trulicity 1.5 mg weekly - Glipizide XL 10 mg in a.m. and 5 mg before dinner  Please cut out snacks after dinner.  Please return in 3 months with your sugar log.   - we will check a HbA1c when he returns to the  clinic - continue checking sugars at different times of the day - check 1x a day, rotating checks - advised for yearly eye exams >> he is UTD - Return to clinic in 3 mo with sugar log   2. HL - Reviewed latest lipid panel from 08/2017: HDL low, LDL lower than 100 triglycerides high Lab Results  Component Value Date   CHOL 175 08/20/2017   HDL 37.20 (L) 08/20/2017   LDLCALC 104 07/13/2016   LDLDIRECT 96.0 08/20/2017   TRIG 329.0 (H) 08/20/2017   CHOLHDL 5 08/20/2017  - Continues pravastatin without side effects.  3. Obesity -Continue Trulicity which should also help with weight loss -Previously on the [employer], now off - lost 10 lbs since last OV -he is walking more  -We also discussed about cutting out snacks at night and between meals in general   Component     Latest Ref Rng & Units 09/24/2018  Cholesterol     0 - 200 mg/dL 180  Triglycerides     0.0 - 149.0 mg/dL 545.0 Triglyceride is over 400; calculations on Lipids are invalid. (H)  HDL Cholesterol     >39.00 mg/dL 32.70 (L)  Total CHOL/HDL Ratio      5  Hemoglobin A1C     4.6 - 6.5 % 8.1 (H)  Direct LDL     mg/dL 76.0   Triglycerides and HbA1c increased.  He definitely needs to improve his diet.  At next visit, if sugars not better, will absolutely need to add insulin.   Philemon Kingdom, MD PhD [facility] Endocrinology

## 2018-09-24 ENCOUNTER — Other Ambulatory Visit: Payer: Self-pay

## 2018-09-24 ENCOUNTER — Other Ambulatory Visit (INDEPENDENT_AMBULATORY_CARE_PROVIDER_SITE_OTHER): Payer: Medicare Other

## 2018-09-24 DIAGNOSIS — E1165 Type 2 diabetes mellitus with hyperglycemia: Secondary | ICD-10-CM | POA: Diagnosis not present

## 2018-09-24 DIAGNOSIS — IMO0002 Reserved for concepts with insufficient information to code with codable children: Secondary | ICD-10-CM

## 2018-09-24 DIAGNOSIS — E1140 Type 2 diabetes mellitus with diabetic neuropathy, unspecified: Secondary | ICD-10-CM | POA: Diagnosis not present

## 2018-09-24 DIAGNOSIS — E785 Hyperlipidemia, unspecified: Secondary | ICD-10-CM | POA: Diagnosis not present

## 2018-09-24 LAB — HEMOGLOBIN A1C: Hgb A1c MFr Bld: 8.1 % — ABNORMAL HIGH (ref 4.6–6.5)

## 2018-09-24 LAB — LIPID PANEL
Cholesterol: 180 mg/dL (ref 0–200)
HDL: 32.7 mg/dL — ABNORMAL LOW (ref >39.00)
Total CHOL/HDL Ratio: 5
Triglycerides: 545 mg/dL — ABNORMAL HIGH (ref 0.0–149.0)

## 2018-09-24 LAB — LDL CHOLESTEROL, DIRECT: Direct LDL: 76 mg/dL

## 2018-11-11 ENCOUNTER — Ambulatory Visit (INDEPENDENT_AMBULATORY_CARE_PROVIDER_SITE_OTHER): Payer: Medicare Other | Admitting: Podiatry

## 2018-11-11 ENCOUNTER — Encounter: Payer: Self-pay | Admitting: Podiatry

## 2018-11-11 ENCOUNTER — Other Ambulatory Visit: Payer: Self-pay

## 2018-11-11 VITALS — Temp 97.8°F

## 2018-11-11 DIAGNOSIS — E1142 Type 2 diabetes mellitus with diabetic polyneuropathy: Secondary | ICD-10-CM

## 2018-11-11 NOTE — Progress Notes (Signed)
He presents today after having seen him for gout in the past.  States that his blood sugars been running high recently and has developed some numbness and tingling in his feet and legs.  Also relates a small amount of heel pain left.  Objective: Vital signs are stable alert and oriented x3.  Pulses are palpable.  Neurologic sensorium is diminished per Semmes Weinstein monofilament to the level of the forefoot bilaterally.  Deep tendon reflexes are intact muscle strength is normal and symmetrical bilateral.  Mild tenderness on palpation medial calcaneal tubercle of the left heel.  No open lesions or wounds are noted.  Assessment: Early diabetic peripheral neuropathy without angiopathy.  Early plantar fasciitis left foot.  Plan: He declined any treatment for the plantar fasciitis but we will follow-up with him in 6 months for reevaluation and diabetic check.

## 2018-12-01 IMAGING — CT CT ORBITS W/ CM
1 series · 15 of 30 positions shown, 19 images · IV contrast (iopamidol)
Comparison: None.

CLINICAL DATA: Moderate stage glaucoma. History of hypertension,
diabetes, hyperlipidemia.

EXAM:
CT HEAD WITHOUT AND WITH CONTRAST
CT ORBITS WITH CONTRAST
TECHNIQUE: Contiguous axial images were obtained from the base of the skull
through the vertex with and without contrast. Multidetector CT
imaging of the orbits was performed using the standard protocol with
intravenous contrast.
CONTRAST:  75mL [id number] IOPAMIDOL ([id number]) INJECTION 61%

[Series 4: orbit soft · axial · 0.38mm/px · z∈[-92,-2]mm · 15 of 34 slices shown, 19 images]
[im 2/34  brain]
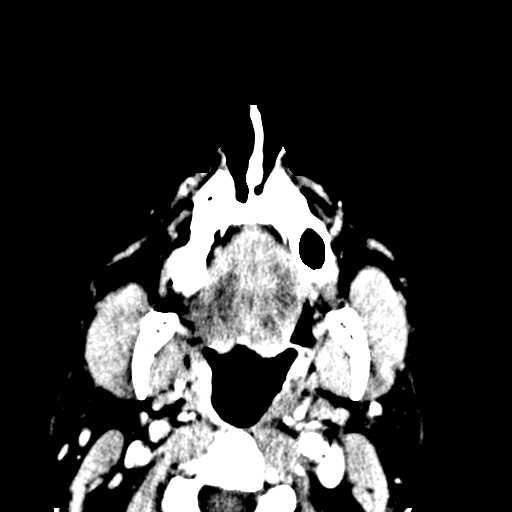
[im 2/34  bone]
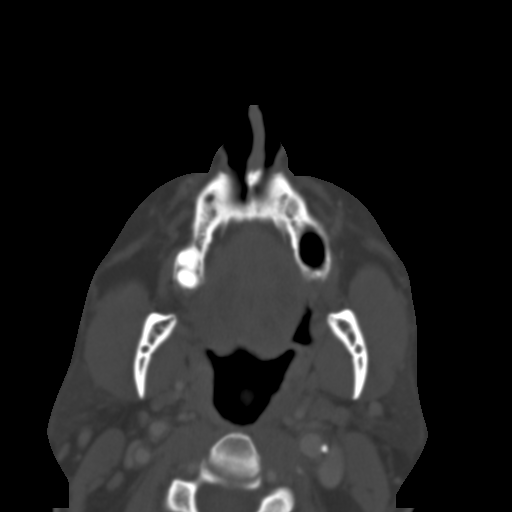
[im 4/34  bone]
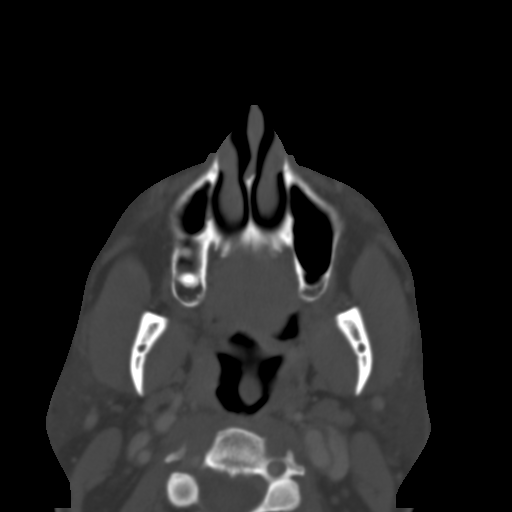
[im 6/34  bone]
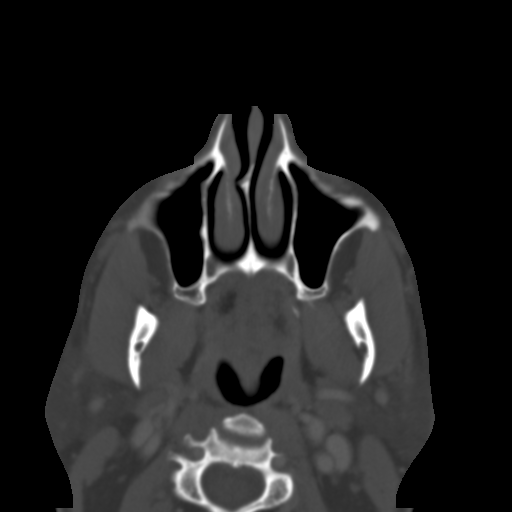
[im 8/34  bone]
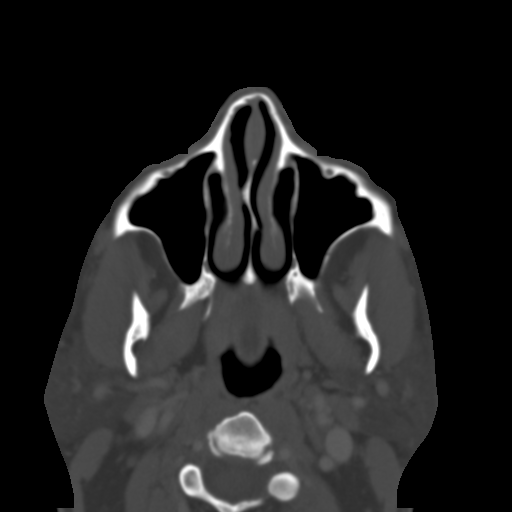
[im 11/34  brain]
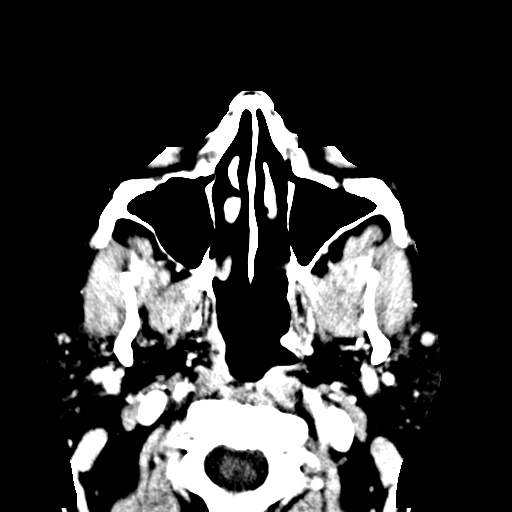
[im 11/34  bone]
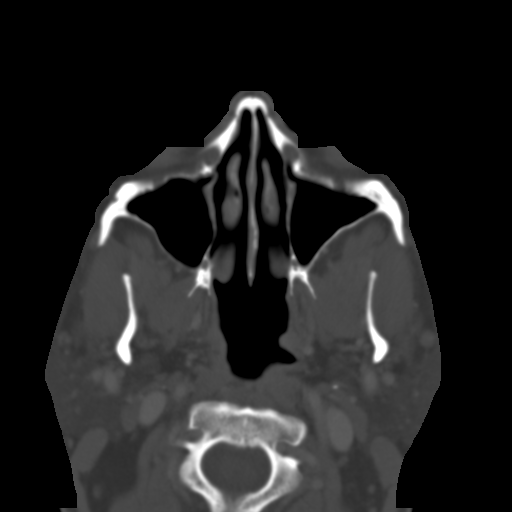
[im 13/34  bone]
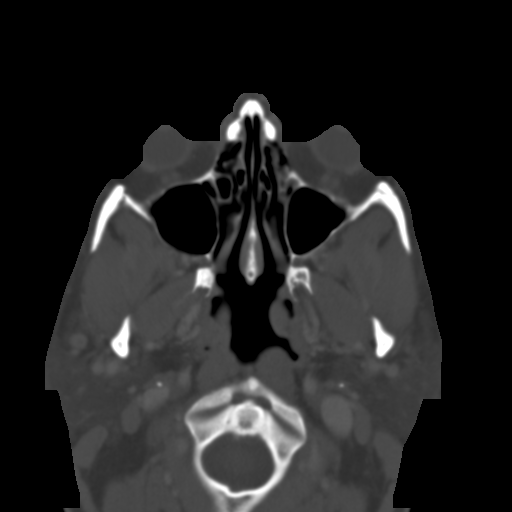
[im 15/34  bone]
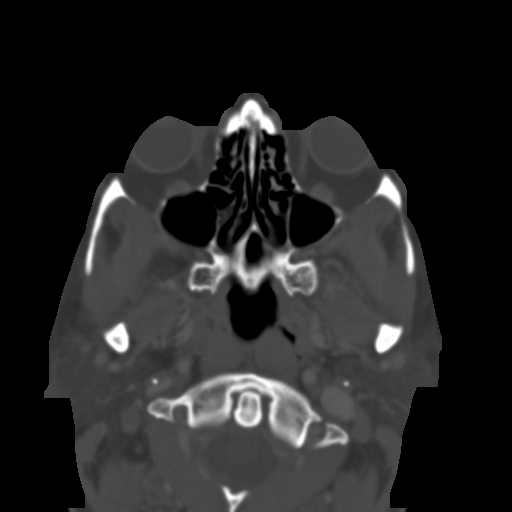
[im 18/34  bone]
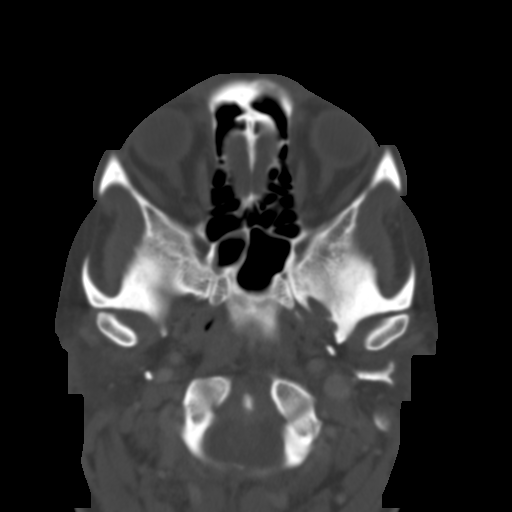
[im 19/34  brain]
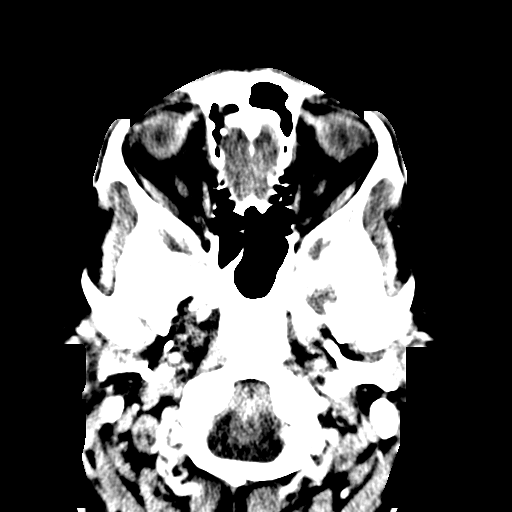
[im 19/34  bone]
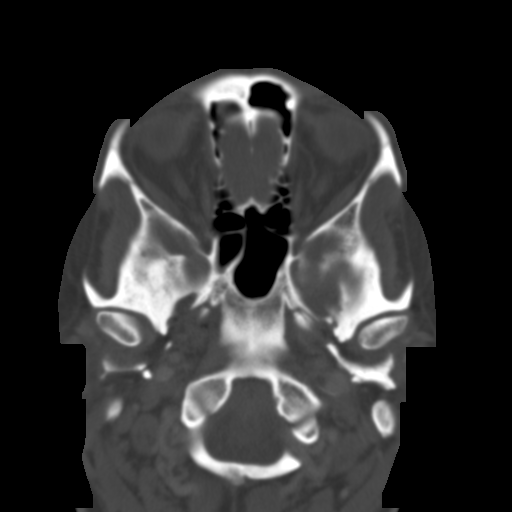
[im 21/34  bone]
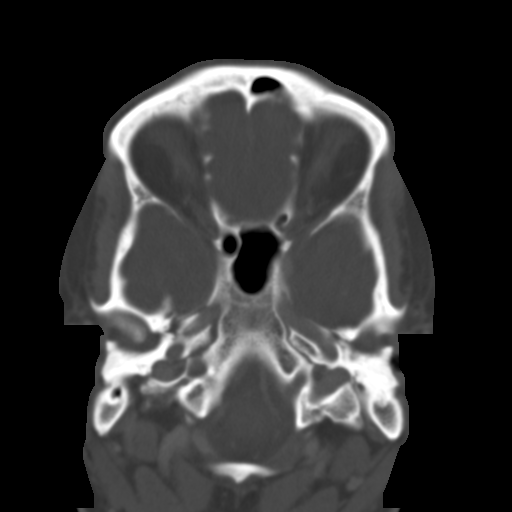
[im 23/34  bone]
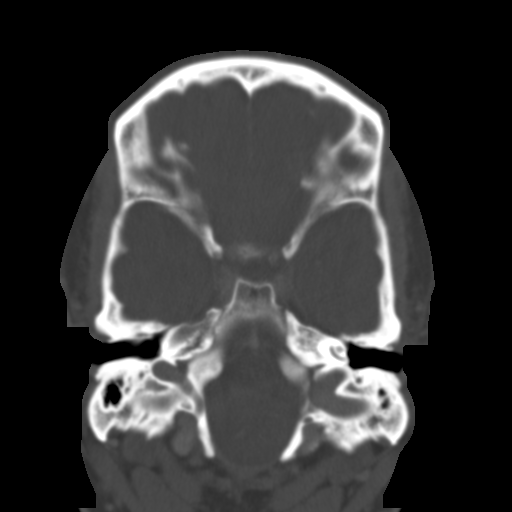
[im 26/34  bone]
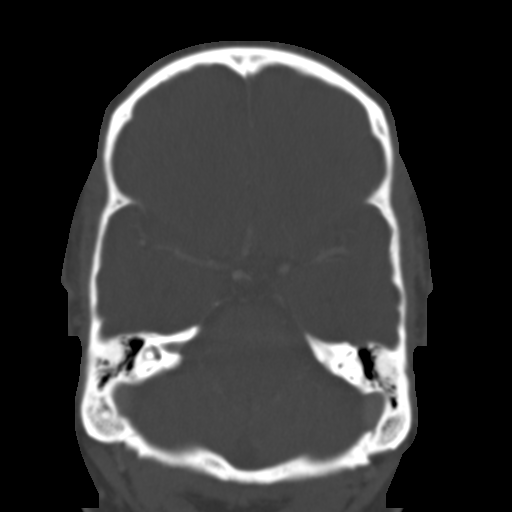
[im 28/34  brain]
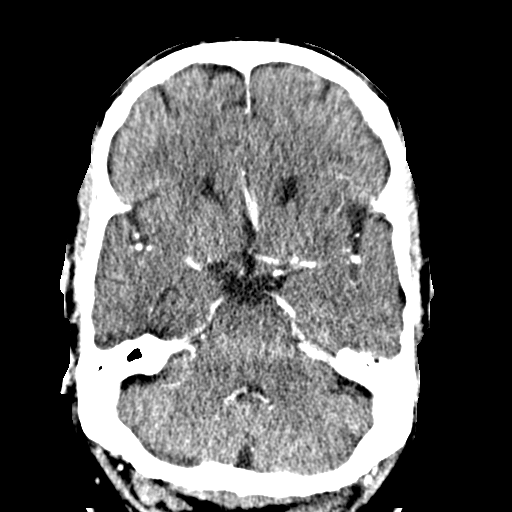
[im 28/34  bone]
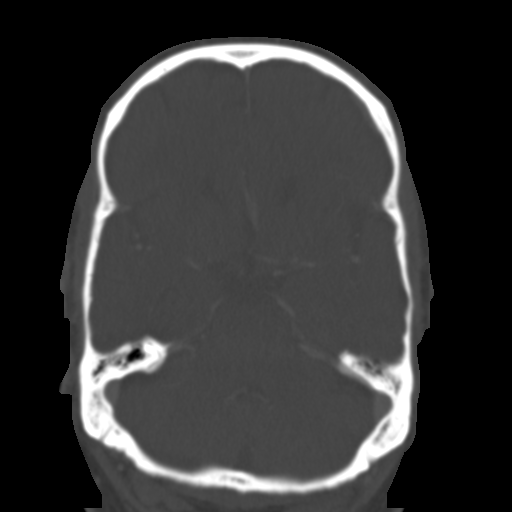
[im 30/34  bone]
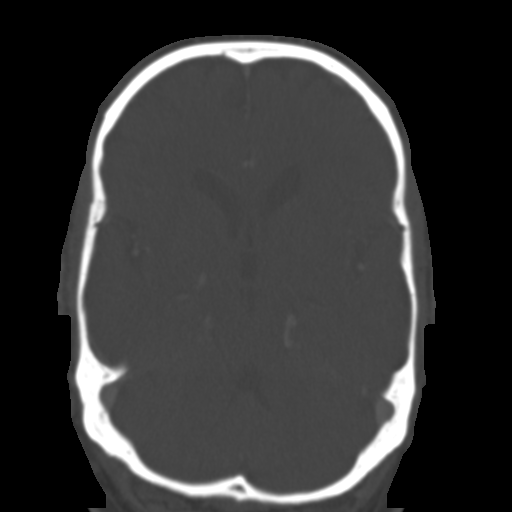
[im 32/34  bone]
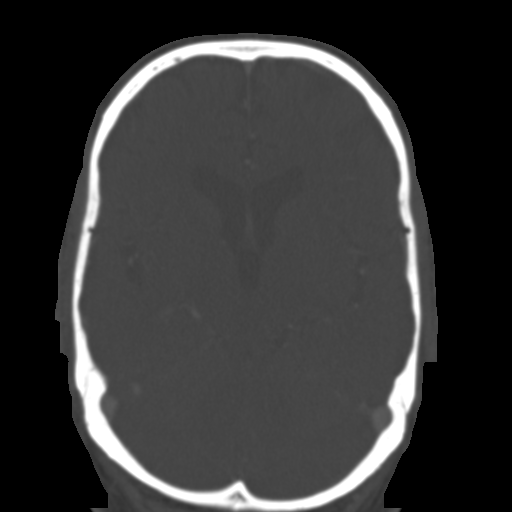

[15 of 30 positions shown; findings below may reference images not displayed]

FINDINGS: CT HEAD FINDINGS

BRAIN: No intraparenchymal hemorrhage, mass effect nor midline
shift. The ventricles and sulci are normal for age. Patchy
supratentorial white matter hypodensities less than expected for
patient's age, though non-specific are most compatible with chronic
small vessel ischemic disease. No acute large vascular territory
infarcts. No abnormal extra-axial fluid collections. Basal cisterns
are patent. No abnormal intracranial enhancement.

VASCULAR: Mild calcific atherosclerosis of the carotid siphons.

SKULL: No skull fracture. No significant scalp soft tissue swelling.

OTHER: None.

CT ORBITS FINDINGS

ORBITS: Intact ocular globes, normal morphology. Lenses are located.
Normal appearance of the optic nerve sheath complexes. Preservation
of the orbital fat. Normal appearance of the extraocular muscles
which are well located. Superior ophthalmic veins are not enlarged.

SINUSES: Mild paranasal sinus mucosal thickening. Bilateral concha
lamella. Mastoid air cells are well aerated.

OSSEOUS STRUCTURES/ SOFT TISSUES: No significant soft tissue
swelling, no subcutaneous gas or radiopaque foreign bodies. No
destructive bony lesions.
IMPRESSION: Normal CT HEAD with and without contrast for age.

Normal contrast-enhanced CT ORBITS.

## 2019-01-02 DIAGNOSIS — H401133 Primary open-angle glaucoma, bilateral, severe stage: Secondary | ICD-10-CM | POA: Diagnosis not present

## 2019-01-02 DIAGNOSIS — H2513 Age-related nuclear cataract, bilateral: Secondary | ICD-10-CM | POA: Diagnosis not present

## 2019-01-09 ENCOUNTER — Ambulatory Visit (INDEPENDENT_AMBULATORY_CARE_PROVIDER_SITE_OTHER): Payer: Medicare Other

## 2019-01-09 DIAGNOSIS — Z23 Encounter for immunization: Secondary | ICD-10-CM | POA: Diagnosis not present

## 2019-02-03 ENCOUNTER — Ambulatory Visit (INDEPENDENT_AMBULATORY_CARE_PROVIDER_SITE_OTHER): Payer: Medicare Other | Admitting: Podiatry

## 2019-02-03 ENCOUNTER — Other Ambulatory Visit: Payer: Self-pay

## 2019-02-03 ENCOUNTER — Encounter: Payer: Self-pay | Admitting: Podiatry

## 2019-02-03 DIAGNOSIS — S91114A Laceration without foreign body of right lesser toe(s) without damage to nail, initial encounter: Secondary | ICD-10-CM | POA: Diagnosis not present

## 2019-02-03 NOTE — Progress Notes (Signed)
He presents today states that he fell last Wednesday and bent back his fifth toe he noticed that he had a small laceration and because he is a diabetic with a hemoglobin of  7.3 he was concerned about it healing  Objective evaluation reveals vital signs are stable he is alert and oriented x3 there is no erythema edema cellulitis drainage or odor small laceration to the plantar aspect of the sulcus fifth digit right foot no purulence no malodor appears to be healing very nicely.  Pulses remain palpable.  Assessment: Resolving laceration right status post fall.  Plan: Redressed today instructed him on how to dress daily soak every other day warm Epsom salts water follow-up with me as needed.  He will watch for signs symptoms of infection which were explained to him today will notify us with questions or concerns.

## 2019-02-06 ENCOUNTER — Encounter: Payer: Self-pay | Admitting: Internal Medicine

## 2019-03-17 ENCOUNTER — Ambulatory Visit (INDEPENDENT_AMBULATORY_CARE_PROVIDER_SITE_OTHER): Payer: Medicare Other | Admitting: Internal Medicine

## 2019-03-17 ENCOUNTER — Encounter: Payer: Self-pay | Admitting: Internal Medicine

## 2019-03-17 ENCOUNTER — Other Ambulatory Visit: Payer: Self-pay

## 2019-03-17 VITALS — BP 128/60 | HR 82 | Ht 71.0 in | Wt 226.0 lb

## 2019-03-17 DIAGNOSIS — E1140 Type 2 diabetes mellitus with diabetic neuropathy, unspecified: Secondary | ICD-10-CM | POA: Diagnosis not present

## 2019-03-17 DIAGNOSIS — E1165 Type 2 diabetes mellitus with hyperglycemia: Secondary | ICD-10-CM | POA: Diagnosis not present

## 2019-03-17 DIAGNOSIS — E785 Hyperlipidemia, unspecified: Secondary | ICD-10-CM | POA: Diagnosis not present

## 2019-03-17 DIAGNOSIS — E669 Obesity, unspecified: Secondary | ICD-10-CM

## 2019-03-17 DIAGNOSIS — IMO0002 Reserved for concepts with insufficient information to code with codable children: Secondary | ICD-10-CM

## 2019-03-17 LAB — POCT GLYCOSYLATED HEMOGLOBIN (HGB A1C): Hemoglobin A1C: 7.6 % — AB (ref 4.0–5.6)

## 2019-03-17 NOTE — Progress Notes (Signed)
Patient ID: Lucas [name], male   DOB: Dec 14, 1940, 78 y.o.   MRN: [id number]  This visit occurred during the SARS-CoV-2 public health emergency.  Safety protocols were in place, including screening questions prior to the visit, additional usage of staff PPE, and extensive cleaning of exam room while observing appropriate contact time as indicated for disinfecting solutions.   HPI: Lucas [name] is a 78 y.o.-year-old male, presenting for f/u for DM2, dx in 1998, non-insulin-dependent (previous Agent Orange exposure in [place]), uncontrolled, with complications (mild CKD, foot ulcer). Last visit 6 months ago (virtual).  Reviewed HbA1c levels: Lab Results  Component Value Date   HGBA1C 8.1 (H) 09/24/2018   HGBA1C 7.9 (A) 03/18/2018   HGBA1C 7.6 (A) 12/07/2017  07/13/2016: HbA1c 8.0% 04/15/2015: 7.6%  Pt is on a regimen of: - Metformin 2000 mg with dinner - Trulicity 1.5 mg weekly - Glipizide XL 10 mg in a.m. and 5 mg before dinner He had to stop [place] as K increased (took 1 mo, off for 1.5 mo). Invokana was not covered.  Pt checks his sugars 1-2 times a day: - am: 128, 146-197, 238 >> 100, 143-189, 196 >> 114, 144-185, Txgiving: 192, 200 - 2h after b'fast:189 >> n/c >> 312 >> n/c - before lunch: 98, 117,140s >> 83-155 >> 85-167, 182 >> 98, 134 - 2h after lunch: 116, 140 >> n/c - before dinner:  89, 150-189 >> 97, 101-152 >> 79, 95-179, 219 >> 95-183 - 2h after dinner:n/c >> 139 >> n/c  >> 160 - bedtime:  87 >> n/c >> 78, 82, 137, 181 >> 109-130 >> n/c - nighttime: n/c >> 165 >> n/c Lowest sugar was 86 >> 79 >> 60 3 mo ago, 95; he has hypoglycemia awareness in the 70s. Highest sugar was 238 >> 219 >> 200  - Txgiving.  Glucometer: Precision >> One Touch Ultra mini >> [place]  Pt's meals are: - Breakfast:  Oatmeal, wheat English muffin  - Lunch: salad, 1/2 sandwich - Dinner: meat + veggies, added carbs back - Snacks: 2x a day, fruit bar, fresh fruit  + Mild CKD, last  BUN/creatinine:  Lab Results  Component Value Date   BUN 15 08/20/2017   CREATININE 0.99 08/20/2017  04/16/2017: 15/1.2 07/13/2016: BUN/Cr 13/1.0, Glu 116, UACR 0.5 11/10/2015: EGFR 80, creatinine 1.1 04/15/2015: GFR 88.1, Cr 1.06 On lisinopril.  + HL; last set of lipids: Lab Results  Component Value Date   CHOL 180 09/24/2018   HDL 32.70 (L) 09/24/2018   LDLCALC 104 07/13/2016   LDLDIRECT 76.0 09/24/2018   TRIG (H) 09/24/2018    545.0 Triglyceride is over 400; calculations on Lipids are invalid.   CHOLHDL 5 09/24/2018  04/16/2016: 179/288/44/77 04/15/2015: LDL 89 On pravastatin. - last dilated eye exam was in 09/2018: No DR.+ glaucoma. 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 -He denies numbness and tingling in his feet.  He was in a DM clinical trial before: "Jump Start" lifestyle Research Trial. He was on the [employer] and lifestyle program in the past.  He also has a history of HTN, ED. He has gout - on Alopurinol >> Uloric (he had a rash with allopurinol)  He had a R foot ulcer 04/2017 and again in 11/2017. He is seen by a vascular dr.  Lynnell Dike were normal then.  In 11/2017 he was getting Krysexxa injections for gout.  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  ROS:  Constitutional: no weight gain/no weight loss, no fatigue, no subjective hyperthermia, no subjective hypothermia Eyes: no blurry vision, no xerophthalmia ENT: no sore throat, no nodules palpated in neck, no dysphagia, no odynophagia, no hoarseness Cardiovascular: no CP/no SOB/no palpitations/no leg swelling Respiratory: no cough/no SOB/no wheezing Gastrointestinal: no N/no V/no D/+ C/no acid reflux Musculoskeletal: no muscle aches/no joint aches Skin: no rashes, no hair loss Neurological: no tremors/no numbness/no tingling/no dizziness  I reviewed pt's medications, allergies, PMH, social hx, family hx, and changes were documented in the history of present illness.  Otherwise, unchanged from my initial visit note.  Past Medical History:  Diagnosis Date  . Allergic rhinitis   . Diabetes mellitus type II   . Diverticulosis of colon   . Glaucoma   . History of agent Orange exposure   . HLD (hyperlipidemia)   . HTN (hypertension)   . Hypertrophy of prostate without urinary obstruction and other lower urinary tract symptoms (LUTS)   . Osteoarthritis    Past Surgical History:  Procedure Laterality Date  . TOOTH EXTRACTION       History   Social History  . Marital Status: Married    Spouse Name: N/A  . Number of Children: 2   Occupational History  . Retired-[employer]    Social History Main Topics  . Smoking status: Never Smoker   . Smokeless tobacco: Never Used  . Alcohol Use: No  . Drug Use: No    [place] vet- 20% disability from shrapnel wounds 1967 and 20% agent orange   Married (Widowed 1996; remarried 6/03)   Retired [employer]      Has living will   Wife, then daughter Hoyle Barr will be health care POA   Would want attempts at resuscitation   Would not want feeding tube if cognitively unaware   Current Outpatient Medications on File Prior to Visit  Medication Sig Dispense Refill  . albuterol (VENTOLIN HFA) 108 (90 BASE) MCG/ACT inhaler Inhale 2 puffs into the lungs every 4 (four) hours as needed.      Marland Kitchen aspirin 81 MG tablet Take 81 mg by mouth daily.      . brimonidine (ALPHAGAN) 0.2 % ophthalmic solution Place 1 drop into both eyes 2 (two) times daily.     . cetirizine (ZYRTEC) 10 MG tablet Take 10 mg by mouth daily.    . colchicine 0.6 MG tablet Take 0.6 mg by mouth as needed. Take 2 tablets by mouth at first sign of gout flare followed by 1 tablet after 1 hour. Next day 1 tab;et for 5 days    . COMBIGAN 0.2-0.5 % ophthalmic solution     . Dulaglutide (TRULICITY) 1.5 JY/7.8GN SOPN INJECT ONE PEN (1.5 MG TOTAL) UNDER SKIN ON SUNDAYS 12 pen 3  . fluocinonide cream (LIDEX) 0.05 % APPLY TWICE DAILY TO AFFECTED AREAS OF LEGS&ARMS UNTIL  CLEAR,THEN AS NEEDED. AVOID FACE,GROIN,AXILLA  0  . fluticasone (FLONASE) 50 MCG/ACT nasal spray     . gabapentin (NEURONTIN) 300 MG capsule TAKE 2 CAPSULES (600 MG TOTAL) BY MOUTH 3 (THREE) TIMES DAILY. 540 capsule 3  . glipiZIDE (GLUCOTROL XL) 5 MG 24 hr tablet TAKE 2 TABLETS BY MOUTH IN THE AM AND 1 TABLET BEFORE DINNER 270 tablet 3  . glucose blood (ONE TOUCH ULTRA TEST) test strip Use as instructed to check sugar 2 times daily 200 each 5  . indomethacin (INDOCIN) 50 MG capsule Take 1 capsule (50 mg total) by mouth 2 (two) times daily with a meal. 60  capsule 1  . ketoconazole (NIZORAL) 2 % cream     . latanoprost (XALATAN) 0.005 % ophthalmic solution Place 1 drop into both eyes at bedtime.     Marland Kitchen lisinopril (PRINIVIL,ZESTRIL) 20 MG tablet Take 10 mg by mouth daily.     . metFORMIN (GLUCOPHAGE) 1000 MG tablet Take 1,000 mg by mouth 2 (two) times daily with a meal.      . Misc Natural Products (GLUCOSAMINE CHONDROITIN ADV PO) Take 2 tablets by mouth 2 (two) times daily.    . Multiple Vitamin (MULTIVITAMIN) tablet Take 1 tablet by mouth daily.      . mupirocin ointment (BACTROBAN) 2 % Apply to wound twice a day. 30 g 2  . Omega-3 Fatty Acids (FISH OIL) 1200 MG CAPS     . pravastatin (PRAVACHOL) 20 MG tablet Take 10 mg by mouth daily.     . sildenafil (REVATIO) 20 MG tablet TAKE 3 TO 5 TABLETS BY MOUTH ONCE DAILY AS NEEDED 50 tablet 11  . sildenafil (VIAGRA) 50 MG tablet     . triamcinolone ointment (KENALOG) 0.5 % Apply 1 application topically 2 (two) times daily. 30 g 0  . ULORIC 40 MG tablet Take 40 mg by mouth daily.  4  . Vitamin D, Cholecalciferol, 1000 UNITS CAPS Take 1 capsule by mouth daily.     No current facility-administered medications on file prior to visit.    No Known Allergies Family History  Problem Relation Age of Onset  . Colon cancer Father   . Stroke Mother   . Heart attack Mother   . Coronary artery disease Mother   . Hypertension Other        several siblings  .  Diabetes Other        several siblings  . Coronary artery disease Brother   . ALS Sister    PE: BP 128/60   Pulse 82   Ht _0  (1.803 m)   Wt 226 lb (102.5 kg)   SpO2 98%   BMI 31.52 kg/m  Body mass index is 31.52 kg/m. Wt Readings from Last 3 Encounters:  03/17/19 226 lb (102.5 kg)  08/23/18 225 lb (102.1 kg)  08/01/18 228 lb 8 oz (103.6 kg)  09/2019 127/70, HR 79, temp 97.11F, weight 219 lb  Constitutional: overweight, in NAD Eyes: PERRLA, EOMI, no exophthalmos ENT: moist mucous membranes, no thyromegaly, no cervical lymphadenopathy Cardiovascular: RRR, No MRG Respiratory: CTA B Gastrointestinal: abdomen soft, NT, ND, BS+ Musculoskeletal: no deformities, strength intact in all 4 Skin: moist, warm, no rashes Neurological: no tremor with outstretched hands, DTR normal in all 4  ASSESSMENT: 1. DM2, non-insulin-dependent, uncontrolled, with complications - R foot ulcer. Nl ABIs - mild CKD  2. HL  3.  Obesity  PLAN:  1. Patient with longstanding, uncontrolled, type 2 diabetes, on oral antidiabetic regimen and weekly GLP-1 receptor agonist.  At last visit, sugars were a little higher and still above goal.  He again refused long-acting insulin so we continued the same regimen but we discussed about improving his diet by avoiding snacks between meals and after dinner.  I also advised him to move dinner later so he was not hungry in the evening.  He did not return in 3 months for a recheck, as advised.  He returns after 6 months. -At this visit, we reviewed together that his leaving a meter downloads and the sugars appear improved compared to last visit.  He had high blood sugars over Thanksgiving, but overall,  they are at or close to goal.  However, he has some hypoglycemic spikes into the 140s to 170s specially in the morning and occasionally later in the day.  We discussed about possibly increasing Trulicity to 3 mg weekly but he does have constipation with his current dose, so  for now, we will continue the same regimen.  He feels that his sugars improved after increasing his activity and I strongly encouraged him to continue with this.  At next visit, after the holidays, if sugars are still high, we may need to intensify the regimen. - I suggested to:  Patient Instructions  Please continue: - Metformin 2000 mg with dinner - Trulicity 1.5 mg weekly - Glipizide XL 10 mg in a.m. and 5 mg before dinner  Please return in 3-4 months with your sugar log.   - we checked his HbA1c: 7.6% (better) - advised to check sugars at different times of the day - 1x a day, rotating check times - advised for yearly eye exams >> he is UTD - return to clinic in 3-4 months  2. HL -Reviewed latest lipid panel from 09/2018: LDL at goal, triglycerides very high, HDL low Lab Results  Component Value Date   CHOL 180 09/24/2018   HDL 32.70 (L) 09/24/2018   LDLCALC 104 07/13/2016   LDLDIRECT 76.0 09/24/2018   TRIG (H) 09/24/2018    545.0 Triglyceride is over 400; calculations on Lipids are invalid.   CHOLHDL 5 09/24/2018  -Continues pravastatin without side effects.  3. Obesity -Continue Trulicity which should also help with weight loss -Before last visit, he lost 10 pounds as he was walking more -At last visit we discussed about cutting out snacks at night and between meals -gained 1 lb since last OV  Philemon Kingdom, MD PhD [facility] Endocrin[facility]

## 2019-03-17 NOTE — Addendum Note (Signed)
Addended by: Cardell Peach I on: 03/17/2019 11:02 AM   Modules accepted: Orders

## 2019-03-17 NOTE — Patient Instructions (Signed)
Please continue: - Metformin 2000 mg with dinner - Trulicity 1.5 mg weekly - Glipizide XL 10 mg in a.m. and 5 mg before dinner  Please return in 3-4 months with your sugar log.

## 2019-04-25 DIAGNOSIS — M2011 Hallux valgus (acquired), right foot: Secondary | ICD-10-CM | POA: Diagnosis not present

## 2019-04-25 DIAGNOSIS — M1990 Unspecified osteoarthritis, unspecified site: Secondary | ICD-10-CM | POA: Diagnosis not present

## 2019-04-25 DIAGNOSIS — E119 Type 2 diabetes mellitus without complications: Secondary | ICD-10-CM | POA: Diagnosis not present

## 2019-04-25 DIAGNOSIS — M1A9XX1 Chronic gout, unspecified, with tophus (tophi): Secondary | ICD-10-CM | POA: Diagnosis not present

## 2019-04-25 DIAGNOSIS — R21 Rash and other nonspecific skin eruption: Secondary | ICD-10-CM | POA: Diagnosis not present

## 2019-04-28 ENCOUNTER — Other Ambulatory Visit: Payer: Medicare Other

## 2019-05-15 ENCOUNTER — Other Ambulatory Visit: Payer: Self-pay | Admitting: Internal Medicine

## 2019-05-25 ENCOUNTER — Ambulatory Visit: Payer: Medicare Other

## 2019-06-06 ENCOUNTER — Other Ambulatory Visit: Payer: Self-pay | Admitting: Internal Medicine

## 2019-06-19 DIAGNOSIS — H524 Presbyopia: Secondary | ICD-10-CM | POA: Diagnosis not present

## 2019-06-19 DIAGNOSIS — H5203 Hypermetropia, bilateral: Secondary | ICD-10-CM | POA: Diagnosis not present

## 2019-06-19 DIAGNOSIS — H2513 Age-related nuclear cataract, bilateral: Secondary | ICD-10-CM | POA: Diagnosis not present

## 2019-06-19 DIAGNOSIS — H401133 Primary open-angle glaucoma, bilateral, severe stage: Secondary | ICD-10-CM | POA: Diagnosis not present

## 2019-06-19 LAB — HM DIABETES EYE EXAM

## 2019-07-01 ENCOUNTER — Ambulatory Visit: Payer: Medicare Other | Admitting: Internal Medicine

## 2019-07-17 ENCOUNTER — Other Ambulatory Visit: Payer: Self-pay

## 2019-07-18 ENCOUNTER — Encounter: Payer: Self-pay | Admitting: Internal Medicine

## 2019-07-21 ENCOUNTER — Ambulatory Visit (INDEPENDENT_AMBULATORY_CARE_PROVIDER_SITE_OTHER): Payer: Medicare Other | Admitting: Internal Medicine

## 2019-07-21 ENCOUNTER — Encounter: Payer: Self-pay | Admitting: Internal Medicine

## 2019-07-21 ENCOUNTER — Other Ambulatory Visit: Payer: Self-pay

## 2019-07-21 VITALS — BP 120/70 | HR 80 | Ht 71.0 in | Wt 226.0 lb

## 2019-07-21 DIAGNOSIS — E1165 Type 2 diabetes mellitus with hyperglycemia: Secondary | ICD-10-CM | POA: Diagnosis not present

## 2019-07-21 DIAGNOSIS — E669 Obesity, unspecified: Secondary | ICD-10-CM | POA: Diagnosis not present

## 2019-07-21 DIAGNOSIS — E785 Hyperlipidemia, unspecified: Secondary | ICD-10-CM

## 2019-07-21 DIAGNOSIS — E66811 Obesity, class 1: Secondary | ICD-10-CM

## 2019-07-21 DIAGNOSIS — E1140 Type 2 diabetes mellitus with diabetic neuropathy, unspecified: Secondary | ICD-10-CM | POA: Diagnosis not present

## 2019-07-21 DIAGNOSIS — IMO0002 Reserved for concepts with insufficient information to code with codable children: Secondary | ICD-10-CM

## 2019-07-21 LAB — POCT GLYCOSYLATED HEMOGLOBIN (HGB A1C): Hemoglobin A1C: 8.2 % — AB (ref 4.0–5.6)

## 2019-07-21 MED ORDER — TRULICITY 3 MG/0.5ML ~~LOC~~ SOAJ: 3.0000 mg | SUBCUTANEOUS | 3 refills | Status: DC

## 2019-07-21 NOTE — Progress Notes (Signed)
Patient ID: Lucas [name], male   DOB: 12-May-1940, 79 y.o.   MRN: [id number]  This visit occurred during the SARS-CoV-2 public health emergency.  Safety protocols were in place, including screening questions prior to the visit, additional usage of staff PPE, and extensive cleaning of exam room while observing appropriate contact time as indicated for disinfecting solutions.   HPI: Lucas [name] is a 79 y.o.-year-old male, presenting for f/u for DM2, dx in 1998, non-insulin-dependent (previous Agent Orange exposure in [place]), uncontrolled, with complications (mild CKD, foot ulcer). Last visit 4 months ago.  Reviewed HbA1c levels: Lab Results  Component Value Date   HGBA1C 7.6 (A) 03/17/2019   HGBA1C 8.1 (H) 09/24/2018   HGBA1C 7.9 (A) 03/18/2018  07/13/2016: HbA1c 8.0% 04/15/2015: 7.6%  Pt is on a regimen of: - Metformin 2000 mg with dinner - Trulicity 1.5 mg weekly - Glipizide XL 10 mg in a.m. and 5 mg before dinner He had to stop [place] as K increased (took 1 mo, off for 1.5 mo). Invokana was not covered.  Pt checks his sugars 1-2 times a day - am:100, 143-189, 196 >> 114, 144-185, Txgiving: 192, 200 >> 148-220 - 2h after b'fast:189 >> n/c >> 312 >> n/c >> 220 - before lunch: 98, 117,140s >> 83-155 >> 85-167, 182 >> 98, 134 >> 67 - 2h after lunch: 116, 140 >> n/c >> 114-178 - before dinner: 97, 101-152 >> 79, 95-179, 219 >> 95-183 >> 85-163 - 2h after dinner:n/c >> 139 >> n/c  >> 160 >> 167, 172 - bedtime:  87 >> n/c >> 78, 82, 137, 181 >> 109-130 >> n/c >> 137 - nighttime: n/c >> 165 >> n/c >> 117 Lowest sugar was 60  >> 67 (delayed meal); he has hypoglycemia awareness in the 70s. Highest sugar was 200  - Txgiving >> 281   Glucometer: Precision >> One Touch Ultra mini >> [place] >> [employer]  Pt's meals are: - Breakfast:  Oatmeal, wheat English muffin  - Lunch: salad, 1/2 sandwich - Dinner: meat + veggies, added carbs back - Snacks: 2x a day, fruit bar, fresh fruit  +  Mild CKD, last BUN/creatinine reportedly normal - rheumatologist's office. Last available records: Lab Results  Component Value Date   BUN 15 08/20/2017   CREATININE 0.99 08/20/2017  04/16/2017: 15/1.2 07/13/2016: BUN/Cr 13/1.0, Glu 116, UACR 0.5 11/10/2015: EGFR 80, creatinine 1.1 04/15/2015: GFR 88.1, Cr 1.06 On lisinopril.  + HL; last set of lipids: Lab Results  Component Value Date   CHOL 180 09/24/2018   HDL 32.70 (L) 09/24/2018   LDLCALC 104 07/13/2016   LDLDIRECT 76.0 09/24/2018   TRIG (H) 09/24/2018    545.0 Triglyceride is over 400; calculations on Lipids are invalid.   CHOLHDL 5 09/24/2018  04/16/2016: 179/288/44/77 04/15/2015: LDL 89 On pravastatin. - last dilated eye exam was in 09/2019 no DR, + glaucoma. 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 -no numbness and tingling in his feet.  He was in a DM clinical trial before: "Jump Start" lifestyle Research Trial. He was on the [employer] and lifestyle program in the past.  He also has a history of HTN, ED. He has gout - on Alopurinol >> Uloric (he had a rash with allopurinol)  He had a R foot ulcer 04/2017 and again in 11/2017. He is seen by a vascular dr.  Lynnell Dike were normal then.  In 11/2017 he was getting Krysexxa injections for gout.  He had an ulcerated tophus >>  he saw wound care and had 2 skin grafts.  ROS: Constitutional: no weight gain/no weight loss, no fatigue, no subjective hyperthermia, no subjective hypothermia Eyes: no blurry vision, no xerophthalmia ENT: no sore throat, no nodules palpated in neck, no dysphagia, no odynophagia, no hoarseness Cardiovascular: no CP/no SOB/no palpitations/no leg swelling Respiratory: no cough/no SOB/no wheezing Gastrointestinal: no N/no V/no D/no C/no acid reflux Musculoskeletal: no muscle aches/no joint aches Skin: no rashes, no hair loss Neurological: no tremors/no numbness/no tingling/no dizziness  I reviewed pt's medications, allergies, PMH, social hx,  family hx, and changes were documented in the history of present illness. Otherwise, unchanged from my initial visit note.  Past Medical History:  Diagnosis Date  . Allergic rhinitis   . Diabetes mellitus type II   . Diverticulosis of colon   . Glaucoma   . History of agent Orange exposure   . HLD (hyperlipidemia)   . HTN (hypertension)   . Hypertrophy of prostate without urinary obstruction and other lower urinary tract symptoms (LUTS)   . Osteoarthritis    Past Surgical History:  Procedure Laterality Date  . TOOTH EXTRACTION       History   Social History  . Marital Status: Married    Spouse Name: N/A  . Number of Children: 2   Occupational History  . Retired-[employer]    Social History Main Topics  . Smoking status: Never Smoker   . Smokeless tobacco: Never Used  . Alcohol Use: No  . Drug Use: No    [place] vet- 20% disability from shrapnel wounds 1967 and 20% agent orange   Married (Widowed 1996; remarried 6/03)   Retired [employer]      Has living will   Wife, then daughter Hoyle Barr will be health care POA   Would want attempts at resuscitation   Would not want feeding tube if cognitively unaware   Current Outpatient Medications on File Prior to Visit  Medication Sig Dispense Refill  . albuterol (VENTOLIN HFA) 108 (90 BASE) MCG/ACT inhaler Inhale 2 puffs into the lungs every 4 (four) hours as needed.      Marland Kitchen aspirin 81 MG tablet Take 81 mg by mouth daily.      . brimonidine (ALPHAGAN) 0.2 % ophthalmic solution Place 1 drop into both eyes 2 (two) times daily.     . cetirizine (ZYRTEC) 10 MG tablet Take 10 mg by mouth daily.    . colchicine 0.6 MG tablet Take 0.6 mg by mouth as needed. Take 2 tablets by mouth at first sign of gout flare followed by 1 tablet after 1 hour. Next day 1 tab;et for 5 days    . COMBIGAN 0.2-0.5 % ophthalmic solution     . fluocinonide cream (LIDEX) 0.05 % APPLY TWICE DAILY TO AFFECTED AREAS OF LEGS&ARMS UNTIL CLEAR,THEN AS NEEDED. AVOID  FACE,GROIN,AXILLA  0  . fluticasone (FLONASE) 50 MCG/ACT nasal spray     . gabapentin (NEURONTIN) 300 MG capsule TAKE 2 CAPSULES (600 MG TOTAL) BY MOUTH 3 (THREE) TIMES DAILY. 540 capsule 3  . glipiZIDE (GLUCOTROL XL) 5 MG 24 hr tablet TAKE 2 TABLETS BY MOUTH IN THE AM AND 1 TABLET BEFORE DINNER 270 tablet 2  . glucose blood (ONE TOUCH ULTRA TEST) test strip Use as instructed to check sugar 2 times daily 200 each 5  . indomethacin (INDOCIN) 50 MG capsule Take 1 capsule (50 mg total) by mouth 2 (two) times daily with a meal. 60 capsule 1  . ketoconazole (NIZORAL) 2 % cream     .  latanoprost (XALATAN) 0.005 % ophthalmic solution Place 1 drop into both eyes at bedtime.     Marland Kitchen lisinopril (PRINIVIL,ZESTRIL) 20 MG tablet Take 10 mg by mouth daily.     . metFORMIN (GLUCOPHAGE) 1000 MG tablet Take 1,000 mg by mouth 2 (two) times daily with a meal.      . Misc Natural Products (GLUCOSAMINE CHONDROITIN ADV PO) Take 2 tablets by mouth 2 (two) times daily.    . Multiple Vitamin (MULTIVITAMIN) tablet Take 1 tablet by mouth daily.      . mupirocin ointment (BACTROBAN) 2 % Apply to wound twice a day. 30 g 2  . Omega-3 Fatty Acids (FISH OIL) 1200 MG CAPS     . pravastatin (PRAVACHOL) 20 MG tablet Take 10 mg by mouth daily.     . sildenafil (REVATIO) 20 MG tablet TAKE 3 TO 5 TABLETS BY MOUTH ONCE DAILY AS NEEDED 50 tablet 11  . sildenafil (VIAGRA) 50 MG tablet     . triamcinolone ointment (KENALOG) 0.5 % Apply 1 application topically 2 (two) times daily. 30 g 0  . TRULICITY 1.5 OI/3.7CW SOPN INJECT ONE PEN (1.5 MG TOTAL) UNDER SKIN ON SUNDAYS 6 pen 1  . ULORIC 40 MG tablet Take 40 mg by mouth daily.  4  . Vitamin D, Cholecalciferol, 1000 UNITS CAPS Take 1 capsule by mouth daily.     No current facility-administered medications on file prior to visit.   No Known Allergies Family History  Problem Relation Age of Onset  . Colon cancer Father   . Stroke Mother   . Heart attack Mother   . Coronary artery  disease Mother   . Hypertension Other        several siblings  . Diabetes Other        several siblings  . Coronary artery disease Brother   . ALS Sister    PE: BP 120/70   Pulse 80   Ht 5' 11"  (1.803 m)   Wt 226 lb (102.5 kg)   SpO2 97%   BMI 31.52 kg/m  Body mass index is 31.52 kg/m. Wt Readings from Last 3 Encounters:  07/21/19 226 lb (102.5 kg)  03/17/19 226 lb (102.5 kg)  08/23/18 225 lb (102.1 kg)   Constitutional: overweight, in NAD Eyes: PERRLA, EOMI, no exophthalmos ENT: moist mucous membranes, no thyromegaly, no cervical lymphadenopathy Cardiovascular: RRR, No MRG Respiratory: CTA B Gastrointestinal: abdomen soft, NT, ND, BS+ Musculoskeletal: no deformities, strength intact in all 4 Skin: moist, warm, no rashes Neurological: no tremor with outstretched hands, DTR normal in all 4  ASSESSMENT: 1. DM2, non-insulin-dependent, uncontrolled, with complications - R foot ulcer. Nl ABIs - mild CKD  2. HL  3.  Obesity class I  PLAN:  1. Patient with longstanding, uncontrolled, type 2 diabetes, on oral antidiabetic regimen with weekly GLP-1 receptor agonist.  At last visit, sugars appeared improved compared to previous visit.  HbA1c was also better, at 7.6%.  He had some hyperglycemic spikes into the 140s to 170s especially in the morning and occasionally later in the day.  We discussed about possibly increasing Trulicity to 3 mg weekly but he did have constipation so we continued the same dose, at 1.5 mg weekly.  He increased his activity before last visit that he felt that his sugars started to improve. -At this visit, sugars are higher in the morning and they are still mostly above goal later in today.  He did have a low blood sugar in the 60s  after he took glipizide in the morning and delayed lunch.  For now, we will continue with the same dose of glipizide but I advised him that if he has low blood sugars, he needs to decrease the dose of glipizide to 5 mg in a.m. For  now, however, we again discussed about increasing Trulicity dose and he would like to try it.  I also advised him to make changes in his diet, as he admits that he relapsed this since last visit - I suggested to:  Patient Instructions  Please continue: - Metformin 2000 mg with dinner - Glipizide XL 10 mg in a.m. and 5 mg before dinner  Please increase: - Trulicity to 3 mg weekly  Please return in 3-4 months with your sugar log.   - we checked his HbA1c: 8.2% (higher) - advised to check sugars at different times of the day - 1x a day, rotating check times - advised for yearly eye exams >> he is UTD - return to clinic in 3-4 months  2. HL -Review latest lipid panel from 09/2018: LDL at goal, triglycerides very high, HDL low: Lab Results  Component Value Date   CHOL 180 09/24/2018   HDL 32.70 (L) 09/24/2018   LDLCALC 104 07/13/2016   LDLDIRECT 76.0 09/24/2018   TRIG (H) 09/24/2018    545.0 Triglyceride is over 400; calculations on Lipids are invalid.   CHOLHDL 5 09/24/2018  -Continues pravastatin without side effects  3. Obesity class I -Continue Trulicity which should also help with weight loss.  We will also increase the dose today. -We discussed at last visit about cutting out snacks at nighttime between meals -He still has a poor diet, even worse at this visit, per his report -discussed about the need to improve it  Philemon Kingdom, MD PhD [facility] Endocrinology

## 2019-07-21 NOTE — Addendum Note (Signed)
Addended by: Cardell Peach I on: 07/21/2019 11:01 AM   Modules accepted: Orders

## 2019-07-21 NOTE — Patient Instructions (Addendum)
Please continue: - Metformin 2000 mg with dinner - Glipizide XL 10 mg in a.m. and 5 mg before dinner  Please increase: - Trulicity to 3 mg weekly  Please return in 3-4 months with your sugar log.

## 2019-07-30 ENCOUNTER — Other Ambulatory Visit: Payer: Self-pay

## 2019-07-30 ENCOUNTER — Ambulatory Visit (INDEPENDENT_AMBULATORY_CARE_PROVIDER_SITE_OTHER): Payer: Medicare Other | Admitting: Podiatry

## 2019-07-30 DIAGNOSIS — R2681 Unsteadiness on feet: Secondary | ICD-10-CM

## 2019-07-30 DIAGNOSIS — E1142 Type 2 diabetes mellitus with diabetic polyneuropathy: Secondary | ICD-10-CM | POA: Diagnosis not present

## 2019-07-30 NOTE — Progress Notes (Signed)
He presents today for diabetic foot exam.  States that he has some numbness is starting to go up to his legs.  He states that he is starting to lose his balance more he states that his last hemoglobin A1c was not that great at an 8.2.  Objective: Vital signs are stable he is alert and oriented x3 pulses are palpable.  Neurologic sensorium is intact per Semmes Weinstein monofilament.  Deep tendon reflexes are intact muscle strength is normal and symmetrical.  Gait analysis does demonstrate that he walks with a slight shuffle and a widened gait.  Assessment: Diabetic neuropathy associated with imbalance and fall risk.  Plan: I will to send him for strengthening and balance training at [facility] and I will follow-up with him in 6 months

## 2019-08-20 DIAGNOSIS — E119 Type 2 diabetes mellitus without complications: Secondary | ICD-10-CM | POA: Diagnosis not present

## 2019-08-20 DIAGNOSIS — M2011 Hallux valgus (acquired), right foot: Secondary | ICD-10-CM | POA: Diagnosis not present

## 2019-08-20 DIAGNOSIS — M1A9XX1 Chronic gout, unspecified, with tophus (tophi): Secondary | ICD-10-CM | POA: Diagnosis not present

## 2019-08-20 DIAGNOSIS — M1990 Unspecified osteoarthritis, unspecified site: Secondary | ICD-10-CM | POA: Diagnosis not present

## 2019-08-20 DIAGNOSIS — R21 Rash and other nonspecific skin eruption: Secondary | ICD-10-CM | POA: Diagnosis not present

## 2019-08-26 ENCOUNTER — Encounter: Payer: Medicare Other | Admitting: Internal Medicine

## 2019-09-02 ENCOUNTER — Ambulatory Visit (INDEPENDENT_AMBULATORY_CARE_PROVIDER_SITE_OTHER): Payer: Medicare Other | Admitting: Internal Medicine

## 2019-09-02 ENCOUNTER — Ambulatory Visit: Payer: Medicare Other | Admitting: Internal Medicine

## 2019-09-02 ENCOUNTER — Encounter: Payer: Self-pay | Admitting: Internal Medicine

## 2019-09-02 ENCOUNTER — Other Ambulatory Visit: Payer: Self-pay

## 2019-09-02 VITALS — BP 100/60 | HR 73 | Temp 97.0°F | Ht 71.0 in | Wt 220.0 lb

## 2019-09-02 DIAGNOSIS — I10 Essential (primary) hypertension: Secondary | ICD-10-CM

## 2019-09-02 DIAGNOSIS — Z7189 Other specified counseling: Secondary | ICD-10-CM | POA: Diagnosis not present

## 2019-09-02 DIAGNOSIS — M1A00X0 Idiopathic chronic gout, unspecified site, without tophus (tophi): Secondary | ICD-10-CM

## 2019-09-02 DIAGNOSIS — E1142 Type 2 diabetes mellitus with diabetic polyneuropathy: Secondary | ICD-10-CM | POA: Insufficient documentation

## 2019-09-02 DIAGNOSIS — Z0000 Encounter for general adult medical examination without abnormal findings: Secondary | ICD-10-CM

## 2019-09-02 DIAGNOSIS — E1140 Type 2 diabetes mellitus with diabetic neuropathy, unspecified: Secondary | ICD-10-CM | POA: Diagnosis not present

## 2019-09-02 DIAGNOSIS — E1165 Type 2 diabetes mellitus with hyperglycemia: Secondary | ICD-10-CM | POA: Insufficient documentation

## 2019-09-02 DIAGNOSIS — IMO0002 Reserved for concepts with insufficient information to code with codable children: Secondary | ICD-10-CM

## 2019-09-02 DIAGNOSIS — G4733 Obstructive sleep apnea (adult) (pediatric): Secondary | ICD-10-CM | POA: Diagnosis not present

## 2019-09-02 LAB — CBC
HCT: 38.3 % — ABNORMAL LOW (ref 39.0–52.0)
Hemoglobin: 12.5 g/dL — ABNORMAL LOW (ref 13.0–17.0)
MCHC: 32.7 g/dL (ref 30.0–36.0)
MCV: 85.2 fl (ref 78.0–100.0)
Platelets: 221 10*3/uL (ref 150.0–400.0)
RBC: 4.49 Mil/uL (ref 4.22–5.81)
RDW: 14.5 % (ref 11.5–15.5)
WBC: 3.9 10*3/uL — ABNORMAL LOW (ref 4.0–10.5)

## 2019-09-02 LAB — COMPREHENSIVE METABOLIC PANEL
ALT: 28 U/L (ref 0–53)
AST: 38 U/L — ABNORMAL HIGH (ref 0–37)
Albumin: 4.2 g/dL (ref 3.5–5.2)
Alkaline Phosphatase: 45 U/L (ref 39–117)
BUN: 21 mg/dL (ref 6–23)
CO2: 29 mEq/L (ref 19–32)
Calcium: 9 mg/dL (ref 8.4–10.5)
Chloride: 101 mEq/L (ref 96–112)
Creatinine, Ser: 1.19 mg/dL (ref 0.40–1.50)
GFR: 71.6 mL/min (ref >60.00)
Glucose, Bld: 224 mg/dL — ABNORMAL HIGH (ref 70–99)
Potassium: 4.7 mEq/L (ref 3.5–5.1)
Sodium: 135 mEq/L (ref 135–145)
Total Bilirubin: 0.4 mg/dL (ref 0.2–1.2)
Total Protein: 7 g/dL (ref 6.0–8.3)

## 2019-09-02 LAB — LIPID PANEL
Cholesterol: 198 mg/dL (ref 0–200)
HDL: 34.1 mg/dL — ABNORMAL LOW (ref >39.00)
NonHDL: 164.09
Total CHOL/HDL Ratio: 6
Triglycerides: 349 mg/dL — ABNORMAL HIGH (ref 0.0–149.0)
VLDL: 69.8 mg/dL — ABNORMAL HIGH (ref 0.0–40.0)

## 2019-09-02 LAB — HM DIABETES FOOT EXAM

## 2019-09-02 LAB — T4, FREE: Free T4: 0.79 ng/dL (ref 0.60–1.60)

## 2019-09-02 LAB — LDL CHOLESTEROL, DIRECT: Direct LDL: 98 mg/dL

## 2019-09-02 NOTE — Assessment & Plan Note (Signed)
I have personally reviewed the Medicare Annual Wellness questionnaire and have noted 1. The patient's medical and social history 2. Their use of alcohol, tobacco or illicit drugs 3. Their current medications and supplements 4. The patient's functional ability including ADL's, fall risks, home safety risks and hearing or visual             impairment. 5. Diet and physical activities 6. Evidence for depression or mood disorders  The patients weight, height, BMI and visual acuity have been recorded in the chart I have made referrals, counseling and provided education to the patient based review of the above and I have provided the pt with a written personalized care plan for preventive services.  I have provided you with a copy of your personalized plan for preventive services. Please take the time to review along with your updated medication list.  Flu vaccine in the fall No more PSA due to age 79 with colonoscopy--done 2017 Discussed fitness

## 2019-09-02 NOTE — Assessment & Plan Note (Signed)
BP Readings from Last 3 Encounters:  09/02/19 100/60  07/21/19 120/70  03/17/19 128/60   Good control on lisinopril

## 2019-09-02 NOTE — Progress Notes (Signed)
Subjective:    Patient ID: Lucas [name], male    DOB: Dec 29, 1940, 79 y.o.   MRN: [id number]  HPI Here for Medicare wellness visit and follow up of chronic health conditions This visit occurred during the SARS-CoV-2 public health emergency.  Safety protocols were in place, including screening questions prior to the visit, additional usage of staff PPE, and extensive cleaning of exam room while observing appropriate contact time as indicated for disinfecting solutions.   Reviewed form and advanced directives Reviewed other doctors No alcohol or tobacco Tries to walk 2-3 times a week and gym twice a week (Pilates/weights) Vision loss in left eye--unknown etiology Hearing aides---help some Hasn't fallen in the past year---sprained wrist just before last year's checkup No depression or anhedonia Independent with instrumental ADLs Mild memory issues---nothing worrisome  Continues with the rheumatologist On the uloric without flares Hasn't needed the indomethacin  Sugars still not under control Recent increase in trulicity This has made him more constipated---has tried increased fiber Gets some left sided pain--relieved with moving bowels. Uses miralax prn--discussed taking it regularly and adjusting the doseOngoing neuropathy--affects balance No ulcers now  No chest pain or SOB No dizziness or syncope Mild edema--wears support hose if travelling, etc No palpitations  Voids frequently Reasonable flow Usually no nocturia  Current Outpatient Medications on File Prior to Visit  Medication Sig Dispense Refill  . albuterol (VENTOLIN HFA) 108 (90 BASE) MCG/ACT inhaler Inhale 2 puffs into the lungs every 4 (four) hours as needed.      Marland Kitchen aspirin 81 MG tablet Take 81 mg by mouth daily.      . brimonidine (ALPHAGAN) 0.2 % ophthalmic solution Place 1 drop into both eyes 2 (two) times daily.     . cetirizine (ZYRTEC) 10 MG tablet Take 10 mg by mouth daily.    . colchicine 0.6 MG tablet  Take 0.6 mg by mouth as needed. Take 2 tablets by mouth at first sign of gout flare followed by 1 tablet after 1 hour. Next day 1 tab;et for 5 days    . COMBIGAN 0.2-0.5 % ophthalmic solution     . Dulaglutide (TRULICITY) 3 0000000 SOPN Inject 3 mg into the skin once a week. 12 pen 3  . fluticasone (FLONASE) 50 MCG/ACT nasal spray     . gabapentin (NEURONTIN) 300 MG capsule TAKE 2 CAPSULES (600 MG TOTAL) BY MOUTH 3 (THREE) TIMES DAILY. 540 capsule 3  . glipiZIDE (GLUCOTROL XL) 5 MG 24 hr tablet TAKE 2 TABLETS BY MOUTH IN THE AM AND 1 TABLET BEFORE DINNER 270 tablet 2  . glucose blood (ONE TOUCH ULTRA TEST) test strip Use as instructed to check sugar 2 times daily 200 each 5  . indomethacin (INDOCIN) 50 MG capsule Take 1 capsule (50 mg total) by mouth 2 (two) times daily with a meal. (Patient taking differently: Take 50 mg by mouth 2 (two) times daily as needed. ) 60 capsule 1  . ketoconazole (NIZORAL) 2 % cream     . latanoprost (XALATAN) 0.005 % ophthalmic solution Place 1 drop into both eyes at bedtime.     Marland Kitchen lisinopril (PRINIVIL,ZESTRIL) 20 MG tablet Take 10 mg by mouth daily.     . metFORMIN (GLUCOPHAGE) 1000 MG tablet Take 1,000 mg by mouth 2 (two) times daily with a meal.      . Misc Natural Products (GLUCOSAMINE CHONDROITIN ADV PO) Take 2 tablets by mouth 2 (two) times daily.    . Multiple Vitamin (MULTIVITAMIN) tablet Take  1 tablet by mouth daily.      . mupirocin ointment (BACTROBAN) 2 % Apply to wound twice a day. 30 g 2  . Omega-3 Fatty Acids (FISH OIL) 1200 MG CAPS     . pravastatin (PRAVACHOL) 20 MG tablet Take 10 mg by mouth daily.     . sildenafil (REVATIO) 20 MG tablet TAKE 3 TO 5 TABLETS BY MOUTH ONCE DAILY AS NEEDED 50 tablet 11  . triamcinolone ointment (KENALOG) 0.5 % Apply 1 application topically 2 (two) times daily. 30 g 0  . ULORIC 40 MG tablet Take 40 mg by mouth daily.  4  . Vitamin D, Cholecalciferol, 1000 UNITS CAPS Take 1 capsule by mouth daily.     No current  facility-administered medications on file prior to visit.    No Known Allergies  Past Medical History:  Diagnosis Date  . Allergic rhinitis   . Diabetes mellitus type II   . Diverticulosis of colon   . Glaucoma   . History of agent Orange exposure   . HLD (hyperlipidemia)   . HTN (hypertension)   . Hypertrophy of prostate without urinary obstruction and other lower urinary tract symptoms (LUTS)   . Osteoarthritis     Past Surgical History:  Procedure Laterality Date  . TOOTH EXTRACTION      Family History  Problem Relation Age of Onset  . Colon cancer Father   . Stroke Mother   . Heart attack Mother   . Coronary artery disease Mother   . Hypertension Other        several siblings  . Diabetes Other        several siblings  . Coronary artery disease Brother   . ALS Sister   . Dementia Sister     Social History   Socioeconomic History  . Marital status: Married    Spouse name: Not on file  . Number of children: Not on file  . Years of education: Not on file  . Highest education level: Not on file  Occupational History  . Occupation: Retired-[employer]  Tobacco Use  . Smoking status: Never Smoker  . Smokeless tobacco: Never Used  Substance and Sexual Activity  . Alcohol use: No    Alcohol/week: 0.0 standard drinks  . Drug use: No  . Sexual activity: Not on file  Other Topics Concern  . Not on file  Social History Narrative   [place] vet- 20% disability from shrapnel wounds 1967 and 20% agent orange   Married (Widowed 1996; remarried 6/03)   Retired [employer]      Has living will   Wife, then daughter Hoyle Barr will be health care POA   Would want attempts at resuscitation   Would not want feeding tube if cognitively unaware   Social Determinants of Health   Financial Resource Strain:   . Difficulty of Paying Living Expenses:   Food Insecurity:   . Worried About Charity fundraiser in the Last Year:   . Arboriculturist in the Last Year:   Transportation Needs:    . Film/video editor (Medical):   Marland Kitchen Lack of Transportation (Non-Medical):   Physical Activity:   . Days of Exercise per Week:   . Minutes of Exercise per Session:   Stress:   . Feeling of Stress :   Social Connections:   . Frequency of Communication with Friends and Family:   . Frequency of Social Gatherings with Friends and Family:   . Attends Religious Services:   .  Active Member of Clubs or Organizations:   . Attends Archivist Meetings:   Marland Kitchen Marital Status:   Intimate Partner Violence:   . Fear of Current or Ex-Partner:   . Emotionally Abused:   Marland Kitchen Physically Abused:   . Sexually Abused:    Review of Systems  Appetite is not very big Weight down a few pounds Has skin nodules on legs---got steroid cream (and it increased his ocular pressure). Plans to find new dermatologist Teeth are okay---keeps up with dentist Sleeps fine Wears seat belt No heartburn or dysphagia Bowels move every 2-3 days. No blood Some pain in knees in AM, especially left. Better with movement    Objective:   Physical Exam  Constitutional: He is oriented to person, place, and time. He appears well-developed. No distress.  HENT:  Mouth/Throat: Oropharynx is clear and moist. No oropharyngeal exudate.  No oral lesions  Neck: No thyromegaly present.  Cardiovascular: Normal rate, regular rhythm, normal heart sounds and intact distal pulses. Exam reveals no gallop.  No murmur heard. Respiratory: Effort normal and breath sounds normal. No respiratory distress. He has no wheezes. He has no rales.  GI: Soft. There is no abdominal tenderness.  Musculoskeletal:        General: No tenderness or edema.  Lymphadenopathy:    He has no cervical adenopathy.  Neurological: He is alert and oriented to person, place, and time.  President--- "Edmon Crape, Trump, Obama" [phone] [id number] Recall 3/3  Decreased sensation in feet  Skin: No rash noted.  No foot lesions  Psychiatric: He has a  normal mood and affect. His behavior is normal.           Assessment & Plan:

## 2019-09-02 NOTE — Assessment & Plan Note (Signed)
See social history 

## 2019-09-02 NOTE — Assessment & Plan Note (Signed)
Finally quiet on the [employer]

## 2019-09-02 NOTE — Assessment & Plan Note (Signed)
Lab Results  Component Value Date   HGBA1C 8.2 (A) 07/21/2019   Working with Dr Cruzita Lederer Balance issues ---discussed walking stick (and will go for balance training)

## 2019-09-02 NOTE — Assessment & Plan Note (Signed)
Mild so uses the CPAP sporadically It does help

## 2019-09-02 NOTE — Progress Notes (Signed)
Hearing Screening   125Hz  250Hz  500Hz  1000Hz  2000Hz  3000Hz  4000Hz  6000Hz  8000Hz   Right ear:           Left ear:           Comments: Has hearing aids. Wearing them today.  Vision Screening Comments: 06/19/19

## 2019-09-06 ENCOUNTER — Other Ambulatory Visit: Payer: Self-pay | Admitting: Internal Medicine

## 2019-10-20 ENCOUNTER — Ambulatory Visit: Payer: Medicare Other | Admitting: Family Medicine

## 2019-10-21 NOTE — Progress Notes (Signed)
Lucas [name] T. Lucas Henion, MD, Lucas [name] at [facility] [place], [address]  Phone: [phone]  FAX: [phone]  Lucas [name] - 79 y.o. male  MRN [id number]  Date of Birth: 1940/12/27  Date: 10/22/2019  PCP: [name], MD  Referral: [name], MD  Chief Complaint  Patient presents with  . Back Pain    Lower back  . Fall  . Knee Pain    Left knee giving out    This visit occurred during the SARS-CoV-2 public health emergency.  Safety protocols were in place, including screening questions prior to the visit, additional usage of staff PPE, and extensive cleaning of exam room while observing appropriate contact time as indicated for disinfecting solutions.   Subjective:   Lucas [name] is a 79 y.o. very pleasant male patient with Body mass index is 30.75 kg/m. who presents with the following:  He presents today with back pain for an acute evaluation.  Have seen him distantly for some lumbar radiculopathy on the left as well as on the right in the past.  Jerelyn Scott a couple of times in the last two weeks.  He is primarily worried about this.  He does have some back pain but he has a number of other issues that he is mostly concerned about.  He feels somewhat unsteady with walking.  He does have a history of multiple areas of shrapnel after being in [place].  Sister did have ALS.  A cousin.    His left knee is intermittently been giving way.  He is not having that much pain.  He DOES WALK STRAIGHT / normally bent over.   Has some neuropathy I his legs and he does have dm and some vision changes.  Has some issues with walking.  Normally able to move very praidly and   He recently had some acute vision changes within the last year, and his ophthalmologist thought that this may have been a question of possible stroke in the optic nerve region.  I do not see any  imaging.  Stood up, fell right then with standing up.  He does not feel lightheaded.  Heel toe abnormal Unable to stand / eyes closed Romberg abnormal Unable to stand on 1 foot Unable to tandem stand    Review of Systems is noted in the HPI, as appropriate   Objective:   BP 120/60   Pulse 97   Temp 97.6 F (36.4 C) (Temporal)   Ht 5\' 11"  (1.803 m)   Wt 220 lb 8 oz (100 kg)   SpO2 97%   BMI 30.75 kg/m   Vitals:   10/22/19 0942  BP: 120/60  Pulse: 97  Temp: 97.6 F (36.4 C)  Height: 5\' 11"  (1.803 m)  Weight: 220 lb 8 oz (100 kg)  SpO2: 97%  TempSrc: Temporal  BMI (Calculated): 30.77   I am unable to find his orthostatic vital signs in  Link, but he did not have orthostasis.  His blood pressure actually went up slightly with sitting and standing.   Neuro: CN 2-12 grossly intact. PERRLA. EOMI. Sensation intact throughout. Str 5/5 all extremities. DTR 2+. No clonus. A and o x 4.   Heel toe abnormal Unable to stand / eyes closed Romberg abnormal Unable to stand on 1 foot Unable to tandem stand   PSYCH: Normally interactive. Conversant. Not depressed or anxious appearing.  Calm  demeanor.  .   Knee: Left Gait: Normal heel toe pattern ROM: 0-1 25 Effusion: Minimal Echymosis or edema: none Patellar tendon NT Painful PLICA: neg Patellar grind: negative Medial and lateral patellar facet loading: negative medial and lateral joint lines mild medial joint line tenderness  Mcmurray's neg Flexion-pinch neg Varus and valgus stress: stable Lachman: neg Ant and Post drawer: neg Hip abduction, IR, ER: WNL Hip flexion str: 5/5 Hip abd: 5/5 Quad: 5/5 VMO atrophy:No Hamstring concentric and eccentric: 5/5   Radiology: No results found.  Assessment and Plan:     ICD-10-CM   1. Balance problem  R26.89 Ambulatory referral to Neurology  2. Fall, initial encounter  W19.XXXA Ambulatory referral to Neurology  3. Abnormal neurological exam  R29.90  Ambulatory referral to Neurology  4. Vision changes  H53.9 Ambulatory referral to Neurology  5. Family hx of ALS (amyotrophic lateral sclerosis)  Z82.0 Ambulatory referral to Neurology  6. Controlled type 2 diabetes mellitus with diabetic neuropathy, without long-term current use of insulin (HCC)  E11.40 Ambulatory referral to Neurology   Total encounter time: 40 minutes. On the day of the patient encounter, this can include review of prior records, labs, and imaging.  Additional time can include counselling, consultation with peer MD in person or by telephone.  This also includes independent review of Radiology.  Extensive record review, long conversation with the patient.  He does have an abnormal neurological exam as above.  He is having some balance and gait disturbance.  This certainly could be a distinct neurological process and multiple etiologies cannot be excluded including a demyelination, neoplasm, or many other issues.  Does have shrapnel in his body, and I do not think he can have an MRI.  Ultimately, I would entirely defer that to someone with more experience in this matter, and I think that he would benefit from having a formal neurological evaluation.  I appreciate their help  Follow-up: No follow-ups on file.  No orders of the defined types were placed in this encounter.  Medications Discontinued During This Encounter  Medication Reason  . ketoconazole (NIZORAL) 2 % cream Discontinued by provider   Orders Placed This Encounter  Procedures  . Ambulatory referral to Neurology    Signed,  Lucas Hamman T. Ryden Wainer, MD   Outpatient Encounter Medications as of 10/22/2019  Medication Sig  . albuterol (VENTOLIN HFA) 108 (90 BASE) MCG/ACT inhaler Inhale 2 puffs into the lungs every 4 (four) hours as needed.    Marland Kitchen aspirin 81 MG tablet Take 81 mg by mouth daily.    . brimonidine (ALPHAGAN) 0.2 % ophthalmic solution Place 1 drop into both eyes 2 (two) times daily.   . cetirizine  (ZYRTEC) 10 MG tablet Take 10 mg by mouth daily.  . colchicine 0.6 MG tablet Take 0.6 mg by mouth as needed. Take 2 tablets by mouth at first sign of gout flare followed by 1 tablet after 1 hour. Next day 1 tab;et for 5 days  . COMBIGAN 0.2-0.5 % ophthalmic solution   . Dulaglutide (TRULICITY) 3 EG/3.1DV SOPN Inject 3 mg into the skin once a week.  . fluticasone (FLONASE) 50 MCG/ACT nasal spray   . gabapentin (NEURONTIN) 300 MG capsule TAKE 2 CAPSULES (600 MG TOTAL) BY MOUTH 3 (THREE) TIMES DAILY.  Marland Kitchen glipiZIDE (GLUCOTROL XL) 5 MG 24 hr tablet TAKE 2 TABLETS BY MOUTH IN THE AM AND 1 TABLET BEFORE DINNER  . glucose blood (ONE TOUCH ULTRA TEST) test strip Use as instructed to check  sugar 2 times daily  . indomethacin (INDOCIN) 50 MG capsule Take 1 capsule (50 mg total) by mouth 2 (two) times daily with a meal. (Patient taking differently: Take 50 mg by mouth 2 (two) times daily as needed. )  . latanoprost (XALATAN) 0.005 % ophthalmic solution Place 1 drop into both eyes at bedtime.   Marland Kitchen lisinopril (PRINIVIL,ZESTRIL) 20 MG tablet Take 10 mg by mouth daily.   . metFORMIN (GLUCOPHAGE) 1000 MG tablet Take 1,000 mg by mouth 2 (two) times daily with a meal.    . Misc Natural Products (GLUCOSAMINE CHONDROITIN ADV PO) Take 2 tablets by mouth 2 (two) times daily.  . Multiple Vitamin (MULTIVITAMIN) tablet Take 1 tablet by mouth daily.    . mupirocin ointment (BACTROBAN) 2 % Apply to wound twice a day.  . Omega-3 Fatty Acids (FISH OIL) 1200 MG CAPS   . pravastatin (PRAVACHOL) 20 MG tablet Take 10 mg by mouth daily.   . sildenafil (REVATIO) 20 MG tablet TAKE 3 TO 5 TABLETS BY MOUTH ONCE DAILY AS NEEDED  . triamcinolone ointment (KENALOG) 0.5 % Apply 1 application topically 2 (two) times daily.  Marland Kitchen ULORIC 40 MG tablet Take 40 mg by mouth daily.  . Vitamin D, Cholecalciferol, 1000 UNITS CAPS Take 1 capsule by mouth daily.  . [DISCONTINUED] ketoconazole (NIZORAL) 2 % cream    No facility-administered encounter  medications on file as of 10/22/2019.

## 2019-10-22 ENCOUNTER — Other Ambulatory Visit: Payer: Self-pay

## 2019-10-22 ENCOUNTER — Encounter: Payer: Self-pay | Admitting: Family Medicine

## 2019-10-22 ENCOUNTER — Ambulatory Visit (INDEPENDENT_AMBULATORY_CARE_PROVIDER_SITE_OTHER): Payer: Medicare Other | Admitting: Family Medicine

## 2019-10-22 ENCOUNTER — Telehealth: Payer: Self-pay | Admitting: *Deleted

## 2019-10-22 VITALS — BP 120/60 | HR 97 | Temp 97.6°F | Ht 71.0 in | Wt 220.5 lb

## 2019-10-22 DIAGNOSIS — W19XXXA Unspecified fall, initial encounter: Secondary | ICD-10-CM

## 2019-10-22 DIAGNOSIS — E1140 Type 2 diabetes mellitus with diabetic neuropathy, unspecified: Secondary | ICD-10-CM

## 2019-10-22 DIAGNOSIS — Z820 Family history of epilepsy and other diseases of the nervous system: Secondary | ICD-10-CM | POA: Diagnosis not present

## 2019-10-22 DIAGNOSIS — H539 Unspecified visual disturbance: Secondary | ICD-10-CM | POA: Diagnosis not present

## 2019-10-22 DIAGNOSIS — R2990 Unspecified symptoms and signs involving the nervous system: Secondary | ICD-10-CM | POA: Diagnosis not present

## 2019-10-22 DIAGNOSIS — R2689 Other abnormalities of gait and mobility: Secondary | ICD-10-CM | POA: Diagnosis not present

## 2019-10-22 NOTE — Telephone Encounter (Signed)
Message will be sent to Dr. Silvio Pate since he is the PCP.

## 2019-10-22 NOTE — Telephone Encounter (Signed)
Patient stated that he was seen in the office today by Dr. Lorelei Pont.. Patient stated that he forgot to ask for paperwork to be completed for disability parking. .  Patient requested a call back when it is ready. Patient's PCP is Dr. Silvio Pate.

## 2019-10-23 NOTE — Telephone Encounter (Signed)
Form printed and given to Dr Silvio Pate to sign.

## 2019-10-23 NOTE — Telephone Encounter (Signed)
Form done 

## 2019-10-24 NOTE — Telephone Encounter (Signed)
Left message on verified VM that the form was up front ready for pickup

## 2019-10-27 ENCOUNTER — Ambulatory Visit (INDEPENDENT_AMBULATORY_CARE_PROVIDER_SITE_OTHER): Payer: Medicare Other | Admitting: Internal Medicine

## 2019-10-27 ENCOUNTER — Ambulatory Visit (INDEPENDENT_AMBULATORY_CARE_PROVIDER_SITE_OTHER): Payer: Medicare Other | Admitting: Neurology

## 2019-10-27 ENCOUNTER — Encounter: Payer: Self-pay | Admitting: Internal Medicine

## 2019-10-27 ENCOUNTER — Other Ambulatory Visit: Payer: Self-pay

## 2019-10-27 ENCOUNTER — Encounter: Payer: Self-pay | Admitting: Neurology

## 2019-10-27 VITALS — BP 136/78 | HR 88 | Ht 71.0 in | Wt 216.0 lb

## 2019-10-27 VITALS — BP 125/81 | HR 80 | Ht 71.0 in | Wt 216.0 lb

## 2019-10-27 DIAGNOSIS — E1140 Type 2 diabetes mellitus with diabetic neuropathy, unspecified: Secondary | ICD-10-CM | POA: Diagnosis not present

## 2019-10-27 DIAGNOSIS — E785 Hyperlipidemia, unspecified: Secondary | ICD-10-CM | POA: Diagnosis not present

## 2019-10-27 DIAGNOSIS — E1142 Type 2 diabetes mellitus with diabetic polyneuropathy: Secondary | ICD-10-CM | POA: Diagnosis not present

## 2019-10-27 DIAGNOSIS — E669 Obesity, unspecified: Secondary | ICD-10-CM

## 2019-10-27 DIAGNOSIS — IMO0002 Reserved for concepts with insufficient information to code with codable children: Secondary | ICD-10-CM

## 2019-10-27 DIAGNOSIS — G6289 Other specified polyneuropathies: Secondary | ICD-10-CM | POA: Diagnosis not present

## 2019-10-27 DIAGNOSIS — E1165 Type 2 diabetes mellitus with hyperglycemia: Secondary | ICD-10-CM

## 2019-10-27 DIAGNOSIS — G3281 Cerebellar ataxia in diseases classified elsewhere: Secondary | ICD-10-CM

## 2019-10-27 DIAGNOSIS — R278 Other lack of coordination: Secondary | ICD-10-CM

## 2019-10-27 LAB — POCT GLYCOSYLATED HEMOGLOBIN (HGB A1C): Hemoglobin A1C: 7.8 % — AB (ref 4.0–5.6)

## 2019-10-27 MED ORDER — ALPHA-LIPOIC ACID 600 MG PO CAPS: 600.0000 mg | ORAL_CAPSULE | Freq: Two times a day (BID) | ORAL | 3 refills | Status: AC

## 2019-10-27 NOTE — Patient Instructions (Addendum)
Please continue: - Metformin 2000 mg with dinner - Glipizide XL 10 mg in a.m. and 5 mg before dinner - Trulicity 3 mg weekly  Continue to work on [employer]. Stop eating after dinner.  Please return in 4 months with your sugar log.

## 2019-10-27 NOTE — Progress Notes (Signed)
[facility]    Provider:  Dr Jaynee Eagles Requesting Provider: Owens Loffler MD Primary Care Provider:  [name], MD  CC:  Falls, imbalance, neuropathy in feet  HPI:  DELFIN [name] is a 79 y.o. male here as requested by [name],[name] for falls, balance problems, FHx ALS. PMHx uncontrolled diabetes with diabetic polyneuropathy, osteoarthritis of multiple sites and generative disc disease lumbar, gout, hypertension, hyperlipidemia, history of agent orange exposure, glaucoma, obstructive sleep apnea, obesity.  I reviewed [name] notes, patient presented with low back pain recently, having seen him remotely for lumbar radiculopathy on the left as well as on the right in the past, patient reported he had fallen several times, unsteadiness with walking, history of multiple areas of shrapnel after being in [place], sister and cousin did have ALS, also having multiple osteoarthritic symptoms such as left knee, also neuropathy in his legs and vision changes from diabetes and possible ischemic injury to the optic nerve versus retinal artery occlusion from history.  Examination showed that he fell right with standing up, denied lightheadedness, will toe was abnormal, unable to stand with eyes closed, Romberg abnormal, unable to stand on 1 foot, unable to tandem, he did have 5 out of 5 strength, intact sensation, cranial nerves basically intact based on [name] last notes.  Patient is here alone, he reports he is had 3 or 4 falls over the last year, he is also noticed that his leg has bothered him and has weakness, his PCP thought he might have a nerve issue, his sister died of ALS, was unable to do the MRI because of metal in the neck which she was initially unaware of the eye doctor told him optic nerve is not normal, intermittently he will wake up in the morning with a pain in the left groin area and once he starts getting up and moving around it is better. He was  advised to go to PT and given a referral from foot doctor but hasn;t had a chance yet. He has fallen 3 or 4 times, he has lost 25% of his vision in the left eye and has glaucoma, the optic nerve damage is chronic and has uncontrolled diabetes(CT head and orbits in 2018 was negative for this problem). His left side his knee hurts. He has chronic joint pain. He wakes up with a groin pain in the morning and it goes away when he gets up(recommended he discuss with his primary care), when he falls he feels he isn;t paying attention where he steps, a lot of them are mechanical especially with rugs he will trip over them. He has low back pain but no radicular symptoms. Symptoms have been progressing slowly including his neuropathy.  Reviewed notes, labs and imaging from outside physicians, which showed:   CT head 2018: CT HEAD FINDINGS  BRAIN: No intraparenchymal hemorrhage, mass effect nor midline shift. The ventricles and sulci are normal for age. Patchy supratentorial white matter hypodensities less than expected for patient's age, though non-specific are most compatible with chronic small vessel ischemic disease. No acute large vascular territory infarcts. No abnormal extra-axial fluid collections. Basal cisterns are patent. No abnormal intracranial enhancement.  VASCULAR: Mild calcific atherosclerosis of the carotid siphons.  SKULL: No skull fracture. No significant scalp soft tissue swelling.  OTHER: None.  CT ORBITS FINDINGS  ORBITS: Intact ocular globes, normal morphology. Lenses are located. Normal appearance of the optic nerve sheath complexes. Preservation of the orbital fat. Normal appearance of the extraocular  muscles which are well located. Superior ophthalmic veins are not enlarged.  SINUSES: Mild paranasal sinus mucosal thickening. Bilateral concha lamella. Mastoid air cells are well aerated.  OSSEOUS STRUCTURES/ SOFT TISSUES: No significant soft tissue swelling, no  subcutaneous gas or radiopaque foreign bodies. No destructive bony lesions.  IMPRESSION: Normal CT HEAD with and without contrast for age.  Normal contrast-enhanced CT ORBITS.  (Personally reviewed images and agree with the results)  Hemoglobin A1c this morning 7.8.  T4 free and CMP Sep 02, 2019 normal except for elevated glucose 224 and slightly elevated AST at 38.  Review of Systems: Patient complains of symptoms per HPI as well as the following symptoms: gait abnormality. Pertinent negatives and positives per HPI. All others negative.   Social History   Socioeconomic History  . Marital status: Married    Spouse name: Not on file  . Number of children: Not on file  . Years of education: Not on file  . Highest education level: Not on file  Occupational History  . Occupation: Retired-[employer]  Tobacco Use  . Smoking status: Never Smoker  . Smokeless tobacco: Never Used  Substance and Sexual Activity  . Alcohol use: No    Alcohol/week: 0.0 standard drinks  . Drug use: No  . Sexual activity: Not on file  Other Topics Concern  . Not on file  Social History Narrative   [place] vet- 20% disability from shrapnel wounds 1967 and 20% agent orange   Married (Widowed 1996; remarried 6/03)   Retired [employer]      Has living will   Wife, then daughter Hoyle Barr will be health care POA   Would want attempts at resuscitation   Would not want feeding tube if cognitively unaware   Social Determinants of Health   Financial Resource Strain:   . Difficulty of Paying Living Expenses:   Food Insecurity:   . Worried About Charity fundraiser in the Last Year:   . Arboriculturist in the Last Year:   Transportation Needs:   . Film/video editor (Medical):   Marland Kitchen Lack of Transportation (Non-Medical):   Physical Activity:   . Days of Exercise per Week:   . Minutes of Exercise per Session:   Stress:   . Feeling of Stress :   Social Connections:   . Frequency of Communication with Friends and  Family:   . Frequency of Social Gatherings with Friends and Family:   . Attends Religious Services:   . Active Member of Clubs or Organizations:   . Attends Archivist Meetings:   Marland Kitchen Marital Status:   Intimate Partner Violence:   . Fear of Current or Ex-Partner:   . Emotionally Abused:   Marland Kitchen Physically Abused:   . Sexually Abused:     Family History  Problem Relation Age of Onset  . Colon cancer Father   . Stroke Mother   . Heart attack Mother   . Coronary artery disease Mother   . Hypertension Other        several siblings  . Diabetes Other        several siblings  . Coronary artery disease Brother   . ALS Sister   . Dementia Sister   . Neuropathy Neg Hx     Past Medical History:  Diagnosis Date  . Allergic rhinitis   . Diabetes mellitus type II   . Diverticulosis of colon   . Glaucoma    peripheral vision loss left eye.   Marland Kitchen  History of agent Orange exposure   . HLD (hyperlipidemia)   . HTN (hypertension)   . Hypertrophy of prostate without urinary obstruction and other lower urinary tract symptoms (LUTS)   . Osteoarthritis     Patient Active Problem List   Diagnosis Date Noted  . Diabetic polyneuropathy associated with type 2 diabetes mellitus ([place]) 10/27/2019  . Sensory ataxia 10/27/2019  . Uncontrolled diabetes mellitus with diabetic neuropathy ([place]) 09/02/2019  . Obesity, Class I, BMI 30-34.9 12/07/2017  . DDD (degenerative disc disease), lumbar 05/16/2016  . Advance directive discussed with patient 07/28/2015  . Gouty arthropathy, chronic, without tophi 07/08/2015  . Hypertension 07/08/2015  . Obstructive sleep apnea 07/22/2014  . Routine general medical examination at a health care facility 06/06/2011  . BPH with obstruction/lower urinary tract symptoms   . Generalized osteoarthritis of multiple sites 09/20/2006  . Hyperlipidemia 09/17/2006  . GLAUCOMA 09/17/2006  . ALLERGIC RHINITIS 09/17/2006  . DIVERTICULOSIS, COLON 09/17/2006    Past  Surgical History:  Procedure Laterality Date  . TOOTH EXTRACTION      Current Outpatient Medications  Medication Sig Dispense Refill  . albuterol (VENTOLIN HFA) 108 (90 BASE) MCG/ACT inhaler Inhale 2 puffs into the lungs every 4 (four) hours as needed.      Marland Kitchen aspirin 81 MG tablet Take 81 mg by mouth daily.      . brimonidine (ALPHAGAN) 0.2 % ophthalmic solution Place 1 drop into both eyes 2 (two) times daily.     . cetirizine (ZYRTEC) 10 MG tablet Take 10 mg by mouth daily.    . colchicine 0.6 MG tablet Take 0.6 mg by mouth as needed. Take 2 tablets by mouth at first sign of gout flare followed by 1 tablet after 1 hour. Next day 1 tab;et for 5 days    . COMBIGAN 0.2-0.5 % ophthalmic solution     . Dulaglutide (TRULICITY) 3 LP/3.7TK SOPN Inject 3 mg into the skin once a week. 12 pen 3  . fluticasone (FLONASE) 50 MCG/ACT nasal spray     . gabapentin (NEURONTIN) 300 MG capsule TAKE 2 CAPSULES (600 MG TOTAL) BY MOUTH 3 (THREE) TIMES DAILY. 540 capsule 3  . glipiZIDE (GLUCOTROL XL) 5 MG 24 hr tablet TAKE 2 TABLETS BY MOUTH IN THE AM AND 1 TABLET BEFORE DINNER 270 tablet 2  . glucose blood (ONE TOUCH ULTRA TEST) test strip Use as instructed to check sugar 2 times daily 200 each 5  . indomethacin (INDOCIN) 50 MG capsule Take 1 capsule (50 mg total) by mouth 2 (two) times daily with a meal. (Patient taking differently: Take 50 mg by mouth 2 (two) times daily as needed. ) 60 capsule 1  . latanoprost (XALATAN) 0.005 % ophthalmic solution Place 1 drop into both eyes at bedtime.     Marland Kitchen lisinopril (PRINIVIL,ZESTRIL) 20 MG tablet Take 10 mg by mouth daily.     . metFORMIN (GLUCOPHAGE) 1000 MG tablet Take 1,000 mg by mouth 2 (two) times daily with a meal.      . Misc Natural Products (GLUCOSAMINE CHONDROITIN ADV PO) Take 2 tablets by mouth 2 (two) times daily.    . Multiple Vitamin (MULTIVITAMIN) tablet Take 1 tablet by mouth daily.      . mupirocin ointment (BACTROBAN) 2 % Apply to wound twice a day. 30 g 2   . Omega-3 Fatty Acids (FISH OIL) 1200 MG CAPS     . pravastatin (PRAVACHOL) 20 MG tablet Take 10 mg by mouth daily.     Marland Kitchen  sildenafil (REVATIO) 20 MG tablet TAKE 3 TO 5 TABLETS BY MOUTH ONCE DAILY AS NEEDED 50 tablet 11  . triamcinolone ointment (KENALOG) 0.5 % Apply 1 application topically 2 (two) times daily. 30 g 0  . ULORIC 40 MG tablet Take 40 mg by mouth daily.  4  . Vitamin D, Cholecalciferol, 1000 UNITS CAPS Take 1 capsule by mouth daily.    . Alpha-Lipoic Acid 600 MG CAPS Take 1 capsule (600 mg total) by mouth in the morning and at bedtime. 60 capsule 3   No current facility-administered medications for this visit.    Allergies as of 10/27/2019  . (No Known Allergies)    Vitals: BP 125/81   Pulse 80   Ht _0  (1.803 m)   Wt 216 lb (98 kg)   BMI 30.13 kg/m  Last Weight:  Wt Readings from Last 1 Encounters:  10/27/19 216 lb (98 kg)   Last Height:   Ht Readings from Last 1 Encounters:  10/27/19 _1  (1.803 m)     Physical exam: Exam: Gen: NAD, conversant, well nourised, obese, well groomed                     CV: RRR, no MRG. No Carotid Bruits. No peripheral edema, warm, nontender Eyes: Conjunctivae clear without exudates or hemorrhage  Neuro: Detailed Neurologic Exam  Speech:    Speech is normal; fluent and spontaneous with normal comprehension.  Cognition:    The patient is oriented to person, place, and time;     recent and remote memory intact;     language fluent;     normal attention, concentration,     fund of knowledge Cranial Nerves:    The pupils are equal, round, and reactive to light. Attempted fundoscopy pupils too small and cataracts. Right upper homonymous quadrant decrease in peripheral vision. Extraocular movements are intact. Trigeminal sensation is intact and the muscles of mastication are normal. The face is symmetric. The palate elevates in the midline. Hearing intact. Voice is normal. Shoulder shrug is normal. The tongue has normal  motion without fasciculations.   Coordination:    Normal finger to nose, difficulty with HTS on the left  Gait:    wide based, slightly ataxic,   Motor Observation:    No asymmetry, no atrophy, and no involuntary movements noted. Tone:    Normal muscle tone.    Posture:    Posture is normal. normal erect    Strength: left prox arm and leg 4+/5 otherwise strength is V/V in the upper and lower limbs.      Sensation: decreased pin prick to the ankles, absent proprioception and vibration at th egreat toes, +Romberg     Reflex Exam:  DTR's: absent AJs, absent left patellar, otherwise 2+    Toes:    The toes are downgoing bilaterally.   Clonus:    Clonus is absent.    Assessment/Plan:  79 y.o. male here as requested by [name],[name] for falls, balance problems, FHx ALS. PMHx uncontrolled diabetes with diabetic polyneuropathy, osteoarthritis of multiple sites and generative disc disease lumbar, gout, hypertension, hyperlipidemia, history of agent orange exposure, glaucoma, obstructive sleep apnea, obesity.  I reviewed [name] notes, patient presented with low back pain recently, having seen him remotely for lumbar radiculopathy on the left as well as on the right in the past, patient reported he had fallen several times, unsteadiness with walking, history of multiple areas of shrapnel after being in [place], sister  and cousin did have ALS, also having multiple osteoarthritic symptoms such as left knee, also neuropathy in his legs and vision changes from diabetes and possible ischemic injury to the optic nerve versus retinal artery occlusion from history - stable, chronic and CT head and orbits in 2018 was negative for this problem.   Sensory ataxia: Likely due to his uncontrolled diabetes and diabietic neuropathy. Also may be due to exposure to toxins during the war. He says he has difficulty with rugs and tripping over uneven surgaces because of his neuropathy which I agree is  likely the largest contributor contributor. I don;t appreciate any parkinsonian disease or other significant neurologic issues(some minor left side weakness will look for strokes) but we will check CT head and neck for other etiologies such as strokes or myelopathy. Will also perform a thorough serum neuropathy evaluation.   --May consider daily alpha lipoic acid which is an antioxidant that may reduce free radical oxidative stress associated with diabetic polyneuropathy, existing evidence suggests that alpha lipoic acid significantly reduces stabbing, lancinating and burning pain and diabetic neuropathy with its onset of action as early as 1-2 weeks.However patient does not have significant pain but can still try.  - fall precautions, please follow through with Physical Therapy  - we will follow up with patient in 6 months to ensure his symptoms are not progressing at a more rapid rate then we would suspect and if no etiology found and stable in 6 months can release back to pcp.    Orders Placed This Encounter  Procedures  . CT HEAD W & WO CONTRAST  . CT CERVICAL SPINE WO CONTRAST  . Vitamin B1  . Vitamin B6  . B12 and Folate Panel  . Methylmalonic acid, serum  . TSH  . Heavy metals, blood  . Multiple Myeloma Panel (SPEP&IFE w/QIG)  . Basic Metabolic Panel  . ANA w/Reflex  . Sjogren's syndrome antibods(ssa + ssb)   Meds ordered this encounter  Medications  . Alpha-Lipoic Acid 600 MG CAPS    Sig: Take 1 capsule (600 mg total) by mouth in the morning and at bedtime.    Dispense:  60 capsule    Refill:  3    Cc: [name], MD,   [name],[name]  Sarina Ill, MD  [facility] [address] [place], [place] [address]  Phone [phone] Fax [phone]

## 2019-10-27 NOTE — Addendum Note (Signed)
Addended by: Cardell Peach I on: 10/27/2019 10:39 AM   Modules accepted: Orders

## 2019-10-27 NOTE — Patient Instructions (Addendum)
-May consider daily alpha lipoic acid which is an antioxidant that may reduce free radical oxidative stress associated with diabetic polyneuropathy, existing evidence suggests that alpha lipoic acid significantly reduces stabbing, lancinating and burning pain and diabetic neuropathy with its onset of action as early as 1-2 weeks.  - Blood work - CT head and neck - Physical therapy - f/u in 4-6 months    Peripheral Neuropathy Peripheral neuropathy is a type of nerve damage. It affects nerves that carry signals between the spinal cord and the arms, legs, and the rest of the body (peripheral nerves). It does not affect nerves in the spinal cord or brain. In peripheral neuropathy, one nerve or a group of nerves may be damaged. Peripheral neuropathy is a broad category that includes many specific nerve disorders, like diabetic neuropathy, hereditary neuropathy, and carpal tunnel syndrome. What are the causes? This condition may be caused by:  Diabetes. This is the most common cause of peripheral neuropathy.  Nerve injury.  Pressure or stress on a nerve that lasts a long time.  Lack (deficiency) of B vitamins. This can result from alcoholism, poor diet, or a restricted diet.  Infections.  Autoimmune diseases, such as rheumatoid arthritis and systemic lupus erythematosus.  Nerve diseases that are passed from parent to child (inherited).  Some medicines, such as cancer medicines (chemotherapy).  Poisonous (toxic) substances, such as lead and mercury.  Too little blood flowing to the legs.  Kidney disease.  Thyroid disease. In some cases, the cause of this condition is not known. What are the signs or symptoms? Symptoms of this condition depend on which of your nerves is damaged. Common symptoms include:  Loss of feeling (numbness) in the feet, hands, or both.  Tingling in the feet, hands, or both.  Burning pain.  Very sensitive skin.  Weakness.  Not being able to move a  part of the body (paralysis).  Muscle twitching.  Clumsiness or poor coordination.  Loss of balance.  Not being able to control your bladder.  Feeling dizzy.  Sexual problems. How is this diagnosed? Diagnosing and finding the cause of peripheral neuropathy can be difficult. Your health care provider will take your medical history and do a physical exam. A neurological exam will also be done. This involves checking things that are affected by your brain, spinal cord, and nerves (nervous system). For example, your health care provider will check your reflexes, how you move, and what you can feel. You may have other tests, such as:  Blood tests.  Electromyogram (EMG) and nerve conduction tests. These tests check nerve function and how well the nerves are controlling the muscles.  Imaging tests, such as CT scans or MRI to rule out other causes of your symptoms.  Removing a small piece of nerve to be examined in a lab (nerve biopsy). This is rare.  Removing and examining a small amount of the fluid that surrounds the brain and spinal cord (lumbar puncture). This is rare. How is this treated? Treatment for this condition may involve:  Treating the underlying cause of the neuropathy, such as diabetes, kidney disease, or vitamin deficiencies.  Stopping medicines that can cause neuropathy, such as chemotherapy.  Medicine to relieve pain. Medicines may include: ? Prescription or over-the-counter pain medicine. ? Antiseizure medicine. ? Antidepressants. ? Pain-relieving patches that are applied to painful areas of skin.  Surgery to relieve pressure on a nerve or to destroy a nerve that is causing pain.  Physical therapy to help improve movement  and balance.  Devices to help you move around (assistive devices). Follow these instructions at home: Medicines  Take over-the-counter and prescription medicines only as told by your health care provider. Do not take any other medicines  without first asking your health care provider.  Do not drive or use heavy machinery while taking prescription pain medicine. Lifestyle   Do not use any products that contain nicotine or tobacco, such as cigarettes and e-cigarettes. Smoking keeps blood from reaching damaged nerves. If you need help quitting, ask your health care provider.  Avoid or limit alcohol. Too much alcohol can cause a vitamin B deficiency, and vitamin B is needed for healthy nerves.  Eat a healthy diet. This includes: ? Eating foods that are high in fiber, such as fresh fruits and vegetables, whole grains, and beans. ? Limiting foods that are high in fat and processed sugars, such as fried or sweet foods. General instructions   If you have diabetes, work closely with your health care provider to keep your blood sugar under control.  If you have numbness in your feet: ? Check every day for signs of injury or infection. Watch for redness, warmth, and swelling. ? Wear padded socks and comfortable shoes. These help protect your feet.  Develop a good support system. Living with peripheral neuropathy can be stressful. Consider talking with a mental health specialist or joining a support group.  Use assistive devices and attend physical therapy as told by your health care provider. This may include using a walker or a cane.  Keep all follow-up visits as told by your health care provider. This is important. Contact a health care provider if:  You have new signs or symptoms of peripheral neuropathy.  You are struggling emotionally from dealing with peripheral neuropathy.  Your pain is not well-controlled. Get help right away if:  You have an injury or infection that is not healing normally.  You develop new weakness in an arm or leg.  You fall frequently. Summary  Peripheral neuropathy is when the nerves in the arms, or legs are damaged, resulting in numbness, weakness, or pain.  There are many causes of  peripheral neuropathy, including diabetes, pinched nerves, vitamin deficiencies, autoimmune disease, and hereditary conditions.  Diagnosing and finding the cause of peripheral neuropathy can be difficult. Your health care provider will take your medical history, do a physical exam, and do tests, including blood tests and nerve function tests.  Treatment involves treating the underlying cause of the neuropathy and taking medicines to help control pain. Physical therapy and assistive devices may also help. This information is not intended to replace advice given to you by your health care provider. Make sure you discuss any questions you have with your health care provider. Document Revised: 03/09/2017 Document Reviewed: 06/05/2016 Elsevier Patient Education  2020 [employer].

## 2019-10-27 NOTE — Progress Notes (Signed)
Patient ID: Lucas [name], male   DOB: 06-26-1940, 79 y.o.   MRN: [id number]  This visit occurred during the SARS-CoV-2 public health emergency.  Safety protocols were in place, including screening questions prior to the visit, additional usage of staff PPE, and extensive cleaning of exam room while observing appropriate contact time as indicated for disinfecting solutions.   HPI: Lucas [name] is a 79 y.o.-year-old male, presenting for f/u for DM2, dx in 1998, non-insulin-dependent (previous Agent Orange exposure in [place]), uncontrolled, with complications (mild CKD, foot ulcer). Last visit 4 months ago.  Reviewed HbA1c levels: Lab Results  Component Value Date   HGBA1C 8.2 (A) 07/21/2019   HGBA1C 7.6 (A) 03/17/2019   HGBA1C 8.1 (H) 09/24/2018  07/13/2016: HbA1c 8.0% 04/15/2015: 7.6%  Pt is on a regimen of: - Metformin 2000 mg with dinner - Trulicity 1.5 >> 3 mg weekly - Glipizide XL 10 mg before breakfast and 5 mg before dinner He had to stop [place] as K increased (took 1 mo, off for 1.5 mo). Invokana was not covered.  Pt checks his sugars 1-2 times a day - am: 114, 144-185, Txgiving: 192, 200 >> 148-220 >> 73, 120, 129-180, 265 - 2h after b'fast:189 >> n/c >> 312 >> n/c >> 220 >> n/c - before lunch: 83-155 >> 85-167, 182 >> 98, 134 >> 67 >> 84, 88-171, 212 - 2h after lunch: 116, 140 >> n/c >> 114-178 >> n/c - before dinner: 79, 95-179, 219 >> 95-183 >> 85-163 >> 116-165, 172 - 2h after dinner:n/c >> 139 >> n/c  >> 160 >> 167, 172 >> 160 - bedtime: 78, 82, 137, 181 >> 109-130 >> n/c >> 137 >> n/c - nighttime: n/c >> 165 >> n/c >> 117 Lowest sugar was 60  >> 67 (delayed meal) >> 73; he has hypoglycemia awareness in the 70s. Highest sugar was 200  - Txgiving >> 281  >>   Glucometer: Precision >> One Touch Ultra mini >> Livongo >> BioTel  Pt's meals are: - Breakfast:  Oatmeal, wheat English muffin  - Lunch: salad, 1/2 sandwich - Dinner: meat + veggies, added carbs back -  Snacks: 2x a day, fruit bar, fresh fruit -still snacking after dinner  + Mild CKD. Last available records: Lab Results  Component Value Date   BUN 21 09/02/2019   CREATININE 1.19 09/02/2019  04/16/2017: 15/1.2 07/13/2016: BUN/Cr 13/1.0, Glu 116, UACR 0.5 11/10/2015: EGFR 80, creatinine 1.1 04/15/2015: GFR 88.1, Cr 1.06 On lisinopril.  + HL; last set of lipids: Lab Results  Component Value Date   CHOL 198 09/02/2019   HDL 34.10 (L) 09/02/2019   LDLCALC 104 07/13/2016   LDLDIRECT 98.0 09/02/2019   TRIG 349.0 (H) 09/02/2019   CHOLHDL 6 09/02/2019  04/16/2016: 179/288/44/77 04/15/2015: LDL 89 On pravastatin. - last dilated eye exam was on 06/19/2019: No DR. He has glaucoma. 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 - he denies numbness and tingling in his feet.  He was in a DM clinical trial before: "Jump Start" lifestyle Research Trial. He was on the [employer] and lifestyle program in the past.  He also has a history of HTN, ED. He has gout - prev. on Alopurinol >> now on Uloric(he had a rash with allopurinol)  He had a R foot ulcer 04/2017 and again in 11/2017. He is seen by a vascular dr.  Lynnell Dike were normal then.  In 11/2017 he was getting Krysexxa injections for gout.  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  ROS: Constitutional: no weight gain/+ weight loss, no fatigue, no subjective hyperthermia, no subjective hypothermia Eyes: no blurry vision, no xerophthalmia ENT: no sore throat, no nodules palpated in neck, no dysphagia, no odynophagia, no hoarseness Cardiovascular: no CP/no SOB/no palpitations/no leg swelling Respiratory: no cough/no SOB/no wheezing Gastrointestinal: no N/no V/no D/no C/no acid reflux Musculoskeletal: no muscle aches/no joint aches Skin: no rashes, no hair loss Neurological: no tremors/no numbness/no tingling/no dizziness  I reviewed pt's medications, allergies, PMH, social hx, family hx, and changes were  documented in the history of present illness. Otherwise, unchanged from my initial visit note.  Past Medical History:  Diagnosis Date  . Allergic rhinitis   . Diabetes mellitus type II   . Diverticulosis of colon   . Glaucoma   . History of agent Orange exposure   . HLD (hyperlipidemia)   . HTN (hypertension)   . Hypertrophy of prostate without urinary obstruction and other lower urinary tract symptoms (LUTS)   . Osteoarthritis    Past Surgical History:  Procedure Laterality Date  . TOOTH EXTRACTION       History   Social History  . Marital Status: Married    Spouse Name: N/A  . Number of Children: 2   Occupational History  . Retired-[employer]    Social History Main Topics  . Smoking status: Never Smoker   . Smokeless tobacco: Never Used  . Alcohol Use: No  . Drug Use: No    [place] vet- 20% disability from shrapnel wounds 1967 and 20% agent orange   Married (Widowed 1996; remarried 6/03)   Retired [employer]      Has living will   Wife, then daughter Hoyle Barr will be health care POA   Would want attempts at resuscitation   Would not want feeding tube if cognitively unaware   Current Outpatient Medications on File Prior to Visit  Medication Sig Dispense Refill  . albuterol (VENTOLIN HFA) 108 (90 BASE) MCG/ACT inhaler Inhale 2 puffs into the lungs every 4 (four) hours as needed.      Marland Kitchen aspirin 81 MG tablet Take 81 mg by mouth daily.      . brimonidine (ALPHAGAN) 0.2 % ophthalmic solution Place 1 drop into both eyes 2 (two) times daily.     . cetirizine (ZYRTEC) 10 MG tablet Take 10 mg by mouth daily.    . colchicine 0.6 MG tablet Take 0.6 mg by mouth as needed. Take 2 tablets by mouth at first sign of gout flare followed by 1 tablet after 1 hour. Next day 1 tab;et for 5 days    . COMBIGAN 0.2-0.5 % ophthalmic solution     . Dulaglutide (TRULICITY) 3 WJ/1.9JY SOPN Inject 3 mg into the skin once a week. 12 pen 3  . fluticasone (FLONASE) 50 MCG/ACT nasal spray     . gabapentin  (NEURONTIN) 300 MG capsule TAKE 2 CAPSULES (600 MG TOTAL) BY MOUTH 3 (THREE) TIMES DAILY. 540 capsule 3  . glipiZIDE (GLUCOTROL XL) 5 MG 24 hr tablet TAKE 2 TABLETS BY MOUTH IN THE AM AND 1 TABLET BEFORE DINNER 270 tablet 2  . glucose blood (ONE TOUCH ULTRA TEST) test strip Use as instructed to check sugar 2 times daily 200 each 5  . indomethacin (INDOCIN) 50 MG capsule Take 1 capsule (50 mg total) by mouth 2 (two) times daily with a meal. (Patient taking differently: Take 50 mg by mouth 2 (two) times  daily as needed. ) 60 capsule 1  . latanoprost (XALATAN) 0.005 % ophthalmic solution Place 1 drop into both eyes at bedtime.     Marland Kitchen lisinopril (PRINIVIL,ZESTRIL) 20 MG tablet Take 10 mg by mouth daily.     . metFORMIN (GLUCOPHAGE) 1000 MG tablet Take 1,000 mg by mouth 2 (two) times daily with a meal.      . Misc Natural Products (GLUCOSAMINE CHONDROITIN ADV PO) Take 2 tablets by mouth 2 (two) times daily.    . Multiple Vitamin (MULTIVITAMIN) tablet Take 1 tablet by mouth daily.      . mupirocin ointment (BACTROBAN) 2 % Apply to wound twice a day. 30 g 2  . Omega-3 Fatty Acids (FISH OIL) 1200 MG CAPS     . pravastatin (PRAVACHOL) 20 MG tablet Take 10 mg by mouth daily.     . sildenafil (REVATIO) 20 MG tablet TAKE 3 TO 5 TABLETS BY MOUTH ONCE DAILY AS NEEDED 50 tablet 11  . triamcinolone ointment (KENALOG) 0.5 % Apply 1 application topically 2 (two) times daily. 30 g 0  . ULORIC 40 MG tablet Take 40 mg by mouth daily.  4  . Vitamin D, Cholecalciferol, 1000 UNITS CAPS Take 1 capsule by mouth daily.     No current facility-administered medications on file prior to visit.   No Known Allergies Family History  Problem Relation Age of Onset  . Colon cancer Father   . Stroke Mother   . Heart attack Mother   . Coronary artery disease Mother   . Hypertension Other        several siblings  . Diabetes Other        several siblings  . Coronary artery disease Brother   . ALS Sister   . Dementia Sister     PE: BP 136/78   Pulse 88   Ht _0  (1.803 m)   Wt 216 lb (98 kg)   SpO2 97%   BMI 30.13 kg/m  Body mass index is 30.13 kg/m. Wt Readings from Last 3 Encounters:  10/27/19 216 lb (98 kg)  10/22/19 220 lb 8 oz (100 kg)  09/02/19 220 lb (99.8 kg)   Constitutional: overweight, in NAD Eyes: PERRLA, EOMI, no exophthalmos ENT: moist mucous membranes, no thyromegaly, no cervical lymphadenopathy Cardiovascular: RRR, No MRG Respiratory: CTA B Gastrointestinal: abdomen soft, NT, ND, BS+ Musculoskeletal: no deformities, strength intact in all 4 Skin: moist, warm, no rashes Neurological: no tremor with outstretched hands, DTR normal in all 4  ASSESSMENT: 1. DM2, non-insulin-dependent, uncontrolled, with complications - R foot ulcer. Nl ABIs - mild CKD  2. HL  3.  Obesity class I  PLAN:  1. Patient with longstanding, uncontrolled, type 2 diabetes, on oral antidiabetic regimen with Metformin and sulfonylurea + weekly GLP-1 receptor agonist.  At last visit, sugars are higher in the morning and mostly above goal later in the day.  He only had 1 low blood sugar after he took glipizide in the morning and delayed his lunch.  Therefore, we continued the same dose of glipizide but I advised him to decrease the dose of glipizide if he experienced more low blood sugars.  We also increased the dose of Trulicity and discussed about improving diet.  She is tolerating well the higher Trulicity dose. -At this visit, he has no lows and sugars are better in the morning, mostly in the 140-150 range, but occasionally higher, if she has snacks at night (ice cream).  We discussed about stopping the  snacks after dinner.  We also discussed about staying more active.  Otherwise, due to the improvement in the blood sugars, we can continue the same regimen. - I suggested to:  Patient Instructions  Please continue: - Metformin 2000 mg with dinner - Glipizide XL 10 mg in a.m. and 5 mg before dinner - Trulicity  3 mg weekly  Continue to work on [employer]. Stop eating after dinner.  Please return in 4 months with your sugar log.   - we checked his HbA1c: 7.8% (better)  - advised to check sugars at different times of the day - 1x a day, rotating check times - advised for yearly eye exams >> he is UTD - return to clinic in 4 months  2. HL -Reviewed latest lipid panel from 08/2019: LDL above target, triglycerides high, HDL low: Lab Results  Component Value Date   CHOL 198 09/02/2019   HDL 34.10 (L) 09/02/2019   LDLCALC 104 07/13/2016   LDLDIRECT 98.0 09/02/2019   TRIG 349.0 (H) 09/02/2019   CHOLHDL 6 09/02/2019  -Continues pravastatin without side effects  3. Obesity class I -We will continue Trulicity which should also help with weight loss.  We increased the dose at last visit. -Discussed at length at last visit about improving diet and also cutting out snacks at night - he lost 10 pounds since last visit!  Philemon Kingdom, MD PhD [facility] End[facility]

## 2019-11-01 LAB — ANA W/REFLEX: Anti Nuclear Antibody (ANA): NEGATIVE

## 2019-11-01 LAB — MULTIPLE MYELOMA PANEL, SERUM
Albumin SerPl Elph-Mcnc: 3.9 g/dL (ref 2.9–4.4)
Albumin/Glob SerPl: 1.3 (ref 0.7–1.7)
Alpha 1: 0.2 g/dL (ref 0.0–0.4)
Alpha2 Glob SerPl Elph-Mcnc: 0.8 g/dL (ref 0.4–1.0)
B-Globulin SerPl Elph-Mcnc: 1.2 g/dL (ref 0.7–1.3)
Gamma Glob SerPl Elph-Mcnc: 1 g/dL (ref 0.4–1.8)
Globulin, Total: 3.1 g/dL (ref 2.2–3.9)
IgA/Immunoglobulin A, Serum: 352 mg/dL (ref 61–437)
IgG (Immunoglobin G), Serum: 1036 mg/dL (ref 603–1613)
IgM (Immunoglobulin M), Srm: 99 mg/dL (ref 15–143)
Total Protein: 7 g/dL (ref 6.0–8.5)

## 2019-11-01 LAB — METHYLMALONIC ACID, SERUM: Methylmalonic Acid: 177 nmol/L (ref 0–378)

## 2019-11-01 LAB — HEAVY METALS, BLOOD
Arsenic: 8 ug/L (ref 2–23)
Lead, Blood: <1 ug/dL (ref 0–4)
Mercury: 1.4 ug/L (ref 0.0–14.9)

## 2019-11-01 LAB — SJOGREN'S SYNDROME ANTIBODS(SSA + SSB)
ENA SSA (RO) Ab: <0.2 AI (ref 0.0–0.9)
ENA SSB (LA) Ab: <0.2 AI (ref 0.0–0.9)

## 2019-11-01 LAB — BASIC METABOLIC PANEL
BUN/Creatinine Ratio: 24 (ref 10–24)
BUN: 30 mg/dL — ABNORMAL HIGH (ref 8–27)
CO2: 24 mmol/L (ref 20–29)
Calcium: 9.3 mg/dL (ref 8.6–10.2)
Chloride: 102 mmol/L (ref 96–106)
Creatinine, Ser: 1.23 mg/dL (ref 0.76–1.27)
GFR calc Af Amer: 65 mL/min/{1.73_m2} (ref >59)
GFR calc non Af Amer: 56 mL/min/{1.73_m2} — ABNORMAL LOW (ref >59)
Glucose: 65 mg/dL (ref 65–99)
Potassium: 5.3 mmol/L — ABNORMAL HIGH (ref 3.5–5.2)
Sodium: 140 mmol/L (ref 134–144)

## 2019-11-01 LAB — B12 AND FOLATE PANEL
Folate: >20 ng/mL (ref >3.0)
Vitamin B-12: 503 pg/mL (ref 232–1245)

## 2019-11-01 LAB — TSH: TSH: 2.79 u[IU]/mL (ref 0.450–4.500)

## 2019-11-01 LAB — VITAMIN B6: Vitamin B6: 39.4 ug/L (ref 5.3–46.7)

## 2019-11-01 LAB — VITAMIN B1: Thiamine: 129.9 nmol/L (ref 66.5–200.0)

## 2019-11-13 ENCOUNTER — Other Ambulatory Visit: Payer: Self-pay

## 2019-11-13 ENCOUNTER — Ambulatory Visit
Admission: RE | Admit: 2019-11-13 | Discharge: 2019-11-13 | Disposition: A | Discharge status: Home / Self Care | Payer: Medicare Other | Source: Ambulatory Visit | Attending: Neurology | Admitting: Neurology

## 2019-11-13 ENCOUNTER — Telehealth: Payer: Self-pay | Admitting: *Deleted

## 2019-11-13 DIAGNOSIS — G3281 Cerebellar ataxia in diseases classified elsewhere: Secondary | ICD-10-CM | POA: Diagnosis not present

## 2019-11-13 DIAGNOSIS — M5021 Other cervical disc displacement,  high cervical region: Secondary | ICD-10-CM | POA: Diagnosis not present

## 2019-11-13 DIAGNOSIS — R531 Weakness: Secondary | ICD-10-CM | POA: Diagnosis not present

## 2019-11-13 DIAGNOSIS — M47812 Spondylosis without myelopathy or radiculopathy, cervical region: Secondary | ICD-10-CM | POA: Diagnosis not present

## 2019-11-13 DIAGNOSIS — M4802 Spinal stenosis, cervical region: Secondary | ICD-10-CM | POA: Diagnosis not present

## 2019-11-13 MED ORDER — IOPAMIDOL (ISOVUE-300) INJECTION 61%
75.0000 mL | Freq: Once | INTRAVENOUS | Status: AC | PRN
  Administered 2019-11-13: 75 mL via INTRAVENOUS

## 2019-11-13 NOTE — Telephone Encounter (Signed)
Spoke with pt and discussed CT C-spine results per Dr. Leta Baptist. Pt taking Alpha Lipoic Acid and he feels it may be helping some. He was supposed to do PT but he threw away the script, sounded to be accidental. He said he will call his foot doctor to get he PT order again. He will call us if he worsens, otherwise follow up in December. He verbalized appreciation for the call.

## 2019-11-13 NOTE — Telephone Encounter (Signed)
-----   Message from Penni Bombard, MD sent at 11/13/2019  3:31 PM EDT ----- Mild arthritis changes. Continue conservative mgmt. -VRP

## 2019-12-23 ENCOUNTER — Other Ambulatory Visit: Payer: Self-pay | Admitting: Internal Medicine

## 2019-12-30 NOTE — Telephone Encounter (Signed)
Did you make a copy of his card, or just put the dates in?

## 2020-01-12 ENCOUNTER — Other Ambulatory Visit: Payer: Self-pay | Admitting: Internal Medicine

## 2020-01-19 ENCOUNTER — Encounter: Payer: Self-pay | Admitting: Podiatry

## 2020-01-19 ENCOUNTER — Other Ambulatory Visit: Payer: Self-pay

## 2020-01-19 ENCOUNTER — Ambulatory Visit (INDEPENDENT_AMBULATORY_CARE_PROVIDER_SITE_OTHER): Payer: Medicare Other | Admitting: Podiatry

## 2020-01-19 DIAGNOSIS — E1142 Type 2 diabetes mellitus with diabetic polyneuropathy: Secondary | ICD-10-CM

## 2020-01-19 DIAGNOSIS — R2681 Unsteadiness on feet: Secondary | ICD-10-CM | POA: Diagnosis not present

## 2020-01-19 NOTE — Progress Notes (Signed)
He presents today for his diabetic check his neuropathy seems stable and his blood sugar hemoglobin A1c is down to a 7 something he says.  Objective: Vital signs are stable he is alert oriented x3 pulses are palpable.  Neurologic sensorium is intact per Semmes Weinstein monofilament deep tendon reflexes are intact muscle strength is normal symmetrical orthopedic evaluation shows all joints distal to the ankle have a full range of motion without crepitation.  Continues evaluation demonstrates supple well-hydrated cutis no erythema edema cellulitis drainage or odor.  Assessment: History of diabetic peripheral neuropathy right.  Plan: Follow-up with me in 6 months just for foot check bilateral.

## 2020-02-02 ENCOUNTER — Ambulatory Visit: Payer: Medicare Other | Admitting: Podiatry

## 2020-02-08 DIAGNOSIS — Z23 Encounter for immunization: Secondary | ICD-10-CM | POA: Diagnosis not present

## 2020-03-01 ENCOUNTER — Encounter: Payer: Self-pay | Admitting: Internal Medicine

## 2020-03-01 ENCOUNTER — Other Ambulatory Visit: Payer: Self-pay

## 2020-03-01 ENCOUNTER — Ambulatory Visit (INDEPENDENT_AMBULATORY_CARE_PROVIDER_SITE_OTHER): Payer: Medicare Other | Admitting: Internal Medicine

## 2020-03-01 VITALS — BP 120/90 | HR 89 | Ht 71.0 in | Wt 217.4 lb

## 2020-03-01 DIAGNOSIS — E1165 Type 2 diabetes mellitus with hyperglycemia: Secondary | ICD-10-CM | POA: Diagnosis not present

## 2020-03-01 DIAGNOSIS — E1140 Type 2 diabetes mellitus with diabetic neuropathy, unspecified: Secondary | ICD-10-CM | POA: Diagnosis not present

## 2020-03-01 DIAGNOSIS — E669 Obesity, unspecified: Secondary | ICD-10-CM

## 2020-03-01 DIAGNOSIS — E785 Hyperlipidemia, unspecified: Secondary | ICD-10-CM | POA: Diagnosis not present

## 2020-03-01 DIAGNOSIS — IMO0002 Reserved for concepts with insufficient information to code with codable children: Secondary | ICD-10-CM

## 2020-03-01 LAB — POCT GLYCOSYLATED HEMOGLOBIN (HGB A1C): Hemoglobin A1C: 7.8 % — AB (ref 4.0–5.6)

## 2020-03-01 MED ORDER — INSULIN PEN NEEDLE 32G X 4 MM MISC
3 refills | Status: DC
Start: 2020-03-01 — End: 2021-05-23

## 2020-03-01 MED ORDER — TRESIBA FLEXTOUCH 200 UNIT/ML ~~LOC~~ SOPN: PEN_INJECTOR | SUBCUTANEOUS | 3 refills | Status: DC

## 2020-03-01 NOTE — Patient Instructions (Addendum)
Please continue: - Metformin 2000 mg with dinner - Glipizide XL 10 mg in a.m. and 5 mg before dinner - Trulicity 3 mg weekly  Please start: - Tresiba 12 units daily  Increase the dose by 2 units every 4 days until sugars in am are <140 or you reach 30 units  When injecting insulin:  Inject in the abdomen  Rotate the injection sites around the belly button  Change needle for each injection  Keep needle in for 10 sec after last unit of insulin in  Keep the insulin in use out of the fridge   Please return in 4 months with your sugar log.

## 2020-03-01 NOTE — Progress Notes (Signed)
Patient ID: Lucas [name], male   DOB: 1940-12-09, 79 y.o.   MRN: [id number]  This visit occurred during the SARS-CoV-2 public health emergency.  Safety protocols were in place, including screening questions prior to the visit, additional usage of staff PPE, and extensive cleaning of exam room while observing appropriate contact time as indicated for disinfecting solutions.   HPI: Lucas [name] is a 79 y.o.-year-old male, presenting for f/u for DM2, dx in 1998, non-insulin-dependent (previous Agent Orange exposure in [place]), uncontrolled, with complications (mild CKD, foot ulcer). Last visit 4 months ago.  He saw neurology >> started alpha lipoic acid 600  Mg 2x a day.  He fell few times since last OV.  He just moved in another house.  Reviewed HbA1c levels: Lab Results  Component Value Date   HGBA1C 7.8 (A) 10/27/2019   HGBA1C 8.2 (A) 07/21/2019   HGBA1C 7.6 (A) 03/17/2019  07/13/2016: HbA1c 8.0% 04/15/2015: 7.6%  Pt is on a regimen of: - Metformin 2000 mg with dinner - Glipizide XL 10 mg before breakfast and 5 mg before dinner - Trulicity 1.5 >> 3 mg weekly He had to stop [place] as K increased (took 1 mo, off for 1.5 mo). Invokana was not covered.  Pt checks his sugars 1-2 times a day - am:  148-220 >> 73, 120, 129-180, 265 >> 147-195, 202, 210 - 2h after b'fast: 312 >> n/c >> 220 >> n/c >> 233 - before lunch: 98, 134 >> 67 >> 84, 88-171, 212 >> n/c - 2h after lunch: 116, 140 >> n/c >> 114-178 >> n/c - before dinner: 95-183 >> 85-163 >> 116-165, 172 >> n/c - 2h after dinner:n/c  >> 160 >> 167, 172 >> 160 >> n/c - bedtime:  109-130 >> n/c >> 137 >> n/c - nighttime: n/c >> 165 >> n/c >> 117 >> n/c Lowest sugar was 60  >> 67 (delayed meal) >> 73; he has hypoglycemia awareness in the 70s. Highest sugar was 200  - T[employer] >> 281  >>   Glucometer: Precision >> One Touch Ultra mini >> [place] >> Bio[employer]  Pt's meals are: - Breakfast:  Oatmeal, wheat English muffin  - Lunch:  salad, 1/2 sandwich - Dinner: meat + veggies, added carbs back - Snacks: 2x a day, fruit bar, fresh fruit - snacking after dinner  + Mild CKD: Lab Results  Component Value Date   BUN 30 (H) 10/27/2019   CREATININE 1.23 10/27/2019  04/16/2017: 15/1.2 07/13/2016: BUN/Cr 13/1.0, Glu 116, UACR 0.5 11/10/2015: EGFR 80, creatinine 1.1 04/15/2015: GFR 88.1, Cr 1.06 On lisinopril.  + HL; last set of lipids: Lab Results  Component Value Date   CHOL 198 09/02/2019   HDL 34.10 (L) 09/02/2019   LDLCALC 104 07/13/2016   LDLDIRECT 98.0 09/02/2019   TRIG 349.0 (H) 09/02/2019   CHOLHDL 6 09/02/2019  04/16/2016: 179/288/44/77 04/15/2015: LDL 89 On pravastatin 10. - last dilated eye exam was in 06/2019: No DR, + glaucoma. 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 - no numbness and tingling in his feet.  He was in a DM clinical trial before: "Jump Start" lifestyle Research Trial. He was on the [employer] and lifestyle program in the past.  He also has a history of HTN, ED. He has gout - prev. on Alopurinol >> then  on Uloric(he had a rash with allopurinol)  He had a R foot ulcer 04/2017 and again in 11/2017. He is seen by a vascular dr.  Lynnell Dike  were normal then.  In 11/2017 he was getting Krysexxa injections for gout.  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  ROS: Constitutional: no weight gain/no weight loss, no fatigue, no subjective hyperthermia, no subjective hypothermia Eyes: no blurry vision, no xerophthalmia ENT: no sore throat, no nodules palpated in neck, no dysphagia, no odynophagia, no hoarseness Cardiovascular: no CP/no SOB/no palpitations/no leg swelling Respiratory: no cough/no SOB/no wheezing Gastrointestinal: no N/no V/no D/no C/no acid reflux Musculoskeletal: no muscle aches/no joint aches Skin: no rashes, no hair loss Neurological: no tremors/no numbness/no tingling/no dizziness  I reviewed pt's medications, allergies, PMH,  social hx, family hx, and changes were documented in the history of present illness. Otherwise, unchanged from my initial visit note.  Past Medical History:  Diagnosis Date  . Allergic rhinitis   . Diabetes mellitus type II   . Diverticulosis of colon   . Glaucoma    peripheral vision loss left eye.   Marland Kitchen History of agent Orange exposure   . HLD (hyperlipidemia)   . HTN (hypertension)   . Hypertrophy of prostate without urinary obstruction and other lower urinary tract symptoms (LUTS)   . Osteoarthritis    Past Surgical History:  Procedure Laterality Date  . TOOTH EXTRACTION       History   Social History  . Marital Status: Married    Spouse Name: N/A  . Number of Children: 2   Occupational History  . Retired-[employer]    Social History Main Topics  . Smoking status: Never Smoker   . Smokeless tobacco: Never Used  . Alcohol Use: No  . Drug Use: No    [place] vet- 20% disability from shrapnel wounds 1967 and 20% agent orange   Married (Widowed 1996; remarried 6/03)   Retired [employer]      Has living will   Wife, then daughter Hoyle Barr will be health care POA   Would want attempts at resuscitation   Would not want feeding tube if cognitively unaware   Current Outpatient Medications on File Prior to Visit  Medication Sig Dispense Refill  . albuterol (VENTOLIN HFA) 108 (90 BASE) MCG/ACT inhaler Inhale 2 puffs into the lungs every 4 (four) hours as needed.      . Alpha-Lipoic Acid 600 MG CAPS Take 1 capsule (600 mg total) by mouth in the morning and at bedtime. 60 capsule 3  . aspirin 81 MG tablet Take 81 mg by mouth daily.      . brimonidine (ALPHAGAN) 0.2 % ophthalmic solution Place 1 drop into both eyes 2 (two) times daily.     . cetirizine (ZYRTEC) 10 MG tablet Take 10 mg by mouth daily.    . colchicine 0.6 MG tablet Take 0.6 mg by mouth as needed. Take 2 tablets by mouth at first sign of gout flare followed by 1 tablet after 1 hour. Next day 1 tab;et for 5 days    . COMBIGAN  0.2-0.5 % ophthalmic solution     . Dulaglutide (TRULICITY) 3 TD/1.7OH SOPN Inject 3 mg into the skin once a week. 12 pen 3  . fluticasone (FLONASE) 50 MCG/ACT nasal spray     . gabapentin (NEURONTIN) 300 MG capsule TAKE 2 CAPSULES (600 MG TOTAL) BY MOUTH 3 (THREE) TIMES DAILY. 540 capsule 3  . glipiZIDE (GLUCOTROL XL) 5 MG 24 hr tablet TAKE 2 TABLETS BY MOUTH IN THE AM AND 1 TABLET BEFORE DINNER 270 tablet 2  . glucose blood (ONE TOUCH  ULTRA TEST) test strip Use as instructed to check sugar 2 times daily 200 each 5  . indomethacin (INDOCIN) 50 MG capsule Take 1 capsule (50 mg total) by mouth 2 (two) times daily with a meal. (Patient taking differently: Take 50 mg by mouth 2 (two) times daily as needed. ) 60 capsule 1  . latanoprost (XALATAN) 0.005 % ophthalmic solution Place 1 drop into both eyes at bedtime.     Marland Kitchen lisinopril (PRINIVIL,ZESTRIL) 20 MG tablet Take 10 mg by mouth daily.     . metFORMIN (GLUCOPHAGE) 1000 MG tablet Take 1,000 mg by mouth 2 (two) times daily with a meal.      . Misc Natural Products (GLUCOSAMINE CHONDROITIN ADV PO) Take 2 tablets by mouth 2 (two) times daily.    . Multiple Vitamin (MULTIVITAMIN) tablet Take 1 tablet by mouth daily.      . mupirocin ointment (BACTROBAN) 2 % Apply to wound twice a day. 30 g 2  . Omega-3 Fatty Acids (FISH OIL) 1200 MG CAPS     . pravastatin (PRAVACHOL) 20 MG tablet Take 10 mg by mouth daily.     . sildenafil (REVATIO) 20 MG tablet TAKE 3 TO 5 TABLETS BY MOUTH ONCE DAILY AS NEEDED 50 tablet 11  . triamcinolone ointment (KENALOG) 0.5 % Apply 1 application topically 2 (two) times daily. 30 g 0  . ULORIC 40 MG tablet Take 40 mg by mouth daily.  4  . Vitamin D, Cholecalciferol, 1000 UNITS CAPS Take 1 capsule by mouth daily.     No current facility-administered medications on file prior to visit.   No Known Allergies Family History  Problem Relation Age of Onset  . Colon cancer Father   . Stroke Mother   . Heart attack Mother   .  Coronary artery disease Mother   . Hypertension Other        several siblings  . Diabetes Other        several siblings  . Coronary artery disease Brother   . ALS Sister   . Dementia Sister   . Neuropathy Neg Hx    PE: BP 120/90   Pulse 89   Ht 5' 11"  (1.803 m)   Wt 217 lb 6.4 oz (98.6 kg)   SpO2 99%   BMI 30.32 kg/m  Body mass index is 30.32 kg/m. Wt Readings from Last 3 Encounters:  03/01/20 217 lb 6.4 oz (98.6 kg)  10/27/19 216 lb (98 kg)  10/27/19 216 lb (98 kg)   Constitutional: overweight, in NAD Eyes: PERRLA, EOMI, no exophthalmos ENT: moist mucous membranes, no thyromegaly, no cervical lymphadenopathy Cardiovascular: RRR, No MRG Respiratory: CTA B Gastrointestinal: abdomen soft, NT, ND, BS+ Musculoskeletal: no deformities, strength intact in all 4 Skin: moist, warm, no rashes Neurological: no tremor with outstretched hands, DTR normal in all 4  ASSESSMENT: 1. DM2, non-insulin-dependent, uncontrolled, with complications - R foot ulcer. Nl ABIs - mild CKD  2. HL  3.  Obesity class I  PLAN:  1. Patient with longstanding, uncontrolled, type 2 diabetes, on oral antidiabetic regimen with Metformin and sulfonylurea and also weekly GLP-1 receptor agonist he is tolerating Trulicity well. -At last visit, his sugars were better in the morning, but still slightly above goal, at 140-150 range and occasionally higher if he was snacking at night (ice cream).  We discussed about stopping the snacks after dinner and stay more active.  However, due to the improvement in his blood sugars, we did not change his regimen  HbA1c at that time was also better 7.8% -At this visit, sugars in the morning are still consistently above target, even occasionally in the 200s.  He is not checking sugars later in the day, except in one blood sugar after breakfast, which was very high, at 233.  At this point, he does need intensification of treatment and I suggested a low dose of long-acting insulin  is very reluctant to start this but I explained that we can reduce the dose when sugars improve any meds not but hopefully in the future.  We discussed about benefits and possible side effects from the insulin.  I demonstrated pen use and discussed about correct injection technique.  We will start at a low dose and increase as needed.  I advised him how to do the titration.  We will continue the rest of the regimen.  I did advise him to check his sugars a little more after starting insulin and especially to also check them at bedtime. - I suggested to:  Patient Instructions  Please continue: - Metformin 2000 mg with dinner - Glipizide XL 10 mg in a.m. and 5 mg before dinner - Trulicity 3 mg weekly  Please start: - Tresiba 12 units daily  Increase the dose by 2 units every 4 days until sugars in am are <140 or you reach 30 units  When injecting insulin:  Inject in the abdomen  Rotate the injection sites around the belly button  Change needle for each injection  Keep needle in for 10 sec after last unit of insulin in  Keep the insulin in use out of the fridge   Please return in 4 months with your sugar log.   - we checked his HbA1c: 7.8% (stable) - advised to check sugars at different times of the day - 1-2x a day, rotating check times - advised for yearly eye exams >> he is UTD - return to clinic in 4 months 2. HL -Reviewed latest lipid panel from 08/2019: LDL above our goal of less than 70, triglycerides high, HDL low: Lab Results  Component Value Date   CHOL 198 09/02/2019   HDL 34.10 (L) 09/02/2019   LDLCALC 104 07/13/2016   LDLDIRECT 98.0 09/02/2019   TRIG 349.0 (H) 09/02/2019   CHOLHDL 6 09/02/2019  -Continues pravastatin 10 without side effects.  Also on omega-3 fatty acids.  3. Obesity class I -We will continue Trulicity which should also help with weight loss. -We discussed in the past about improving diet and cutting out snacks at night -He lost 10 pounds before  last visit and only gained 1 pound since then  Philemon Kingdom, MD PhD [facility]

## 2020-03-30 ENCOUNTER — Encounter: Payer: Self-pay | Admitting: Adult Health

## 2020-03-30 ENCOUNTER — Ambulatory Visit (INDEPENDENT_AMBULATORY_CARE_PROVIDER_SITE_OTHER): Payer: Medicare Other | Admitting: Adult Health

## 2020-03-30 VITALS — BP 150/88 | HR 78 | Ht 71.0 in | Wt 210.0 lb

## 2020-03-30 DIAGNOSIS — R278 Other lack of coordination: Secondary | ICD-10-CM | POA: Diagnosis not present

## 2020-03-30 DIAGNOSIS — E1142 Type 2 diabetes mellitus with diabetic polyneuropathy: Secondary | ICD-10-CM

## 2020-03-30 NOTE — Progress Notes (Addendum)
PATIENT: Lucas [name] DOB: 04/26/1940  REASON FOR VISIT: follow up Lucas FROM: patient  Lucas OF PRESENT ILLNESS: Today 03/30/20:  [name] is a 79 year old male with a Lucas of diabetic neuropathy/sensory ataxia.  He returns today for follow-up.  He states overall he has remained stable.  He denies any significant pain in the feet.  He states that he only has numbness.  No change in his gait or balance.  He has not completed physical therapy as he has been in the process of selling his home and moving to a townhouse.  His last hemoglobin A1c was 8.2.  He is on insulin.  States that his glucose readings have been better.  He returns today for an evaluation.  Lucas [name] is a 79 y.o. male here as requested by [name],[name] for falls, balance problems, FHx ALS. PMHx uncontrolled diabetes with diabetic polyneuropathy, osteoarthritis of multiple sites and generative disc disease lumbar, gout, hypertension, hyperlipidemia, Lucas of agent orange exposure, glaucoma, obstructive sleep apnea, obesity.  I reviewed [name] notes, patient presented with low back pain recently, having seen him remotely for lumbar radiculopathy on the left as well as on the right in the past, patient reported he had fallen several times, unsteadiness with walking, Lucas of multiple areas of shrapnel after being in [place], sister and cousin did have ALS, also having multiple osteoarthritic symptoms such as left knee, also neuropathy in his legs and vision changes from diabetes and possible ischemic injury to the optic nerve versus retinal artery occlusion from Lucas.  Examination showed that he fell right with standing up, denied lightheadedness, will toe was abnormal, unable to stand with eyes closed, Romberg abnormal, unable to stand on 1 foot, unable to tandem, he did have 5 out of 5 strength, intact sensation, cranial nerves basically intact based on [name] last notes.  Patient  is here alone, he reports he is had 3 or 4 falls over the last year, he is also noticed that his leg has bothered him and has weakness, his PCP thought he might have a nerve issue, his sister died of ALS, was unable to do the MRI because of metal in the neck which she was initially unaware of the eye doctor told him optic nerve is not normal, intermittently he will wake up in the morning with a pain in the left groin area and once he starts getting up and moving around it is better. He was advised to go to PT and given a referral from foot doctor but hasn;t had a chance yet. He has fallen 3 or 4 times, he has lost 25% of his vision in the left eye and has glaucoma, the optic nerve damage is chronic and has uncontrolled diabetes(CT head and orbits in 2018 was negative for this problem). His left side his knee hurts. He has chronic joint pain. He wakes up with a groin pain in the morning and it goes away when he gets up(recommended he discuss with his primary care), when he falls he feels he isn;t paying attention where he steps, a lot of them are mechanical especially with rugs he will trip over them. He has low back pain but no radicular symptoms. Symptoms have been progressing slowly including his neuropathy.  Reviewed notes, labs and imaging from outside physicians, which showed:   CT head 2018: CT HEAD FINDINGS  BRAIN: No intraparenchymal hemorrhage, mass effect nor midline shift. The ventricles and sulci are normal for age.  Patchy supratentorial white matter hypodensities less than expected for patient's age, though non-specific are most compatible with chronic small vessel ischemic disease. No acute large vascular territory infarcts. No abnormal extra-axial fluid collections. Basal cisterns are patent. No abnormal intracranial enhancement.  VASCULAR: Mild calcific atherosclerosis of the carotid siphons.  SKULL: No skull fracture. No significant scalp soft tissue swelling.  OTHER:  None.  CT ORBITS FINDINGS  ORBITS: Intact ocular globes, normal morphology. Lenses are located. Normal appearance of the optic nerve sheath complexes. Preservation of the orbital fat. Normal appearance of the extraocular muscles which are well located. Superior ophthalmic veins are not enlarged.  SINUSES: Mild paranasal sinus mucosal thickening. Bilateral concha lamella. Mastoid air cells are well aerated.  OSSEOUS STRUCTURES/ SOFT TISSUES: No significant soft tissue swelling, no subcutaneous gas or radiopaque foreign bodies. No destructive bony lesions.  IMPRESSION: Normal CT HEAD with and without contrast for age.  Normal contrast-enhanced CT ORBITS.  (Personally reviewed images and agree with the results)  Hemoglobin A1c this morning 7.8.  T4 free and CMP Sep 02, 2019 normal except for elevated glucose 224 and slightly elevated AST at 38.   REVIEW OF SYSTEMS: Out of a complete 14 system review of symptoms, the patient complains only of the following symptoms, and all other reviewed systems are negative.  ALLERGIES: No Known Allergies  HOME MEDICATIONS: Outpatient Medications Prior to Visit  Medication Sig Dispense Refill  . albuterol (VENTOLIN HFA) 108 (90 Base) MCG/ACT inhaler Inhale 2 puffs into the lungs every 4 (four) hours as needed.    . Alpha-Lipoic Acid 600 MG CAPS Take 1 capsule (600 mg total) by mouth in the morning and at bedtime. 60 capsule 3  . aspirin 81 MG tablet Take 81 mg by mouth daily.    . brimonidine (ALPHAGAN) 0.2 % ophthalmic solution Place 1 drop into both eyes 2 (two) times daily.     . cetirizine (ZYRTEC) 10 MG tablet Take 10 mg by mouth daily.    . colchicine 0.6 MG tablet Take 0.6 mg by mouth as needed. Take 2 tablets by mouth at first sign of gout flare followed by 1 tablet after 1 hour. Next day 1 tab;et for 5 days    . COMBIGAN 0.2-0.5 % ophthalmic solution     . Dulaglutide (TRULICITY) 3 WU/1.3KG SOPN Inject 3 mg into the skin once a  week. 12 pen 3  . fluticasone (FLONASE) 50 MCG/ACT nasal spray     . gabapentin (NEURONTIN) 300 MG capsule TAKE 2 CAPSULES (600 MG TOTAL) BY MOUTH 3 (THREE) TIMES DAILY. 540 capsule 3  . glipiZIDE (GLUCOTROL XL) 5 MG 24 hr tablet TAKE 2 TABLETS BY MOUTH IN THE AM AND 1 TABLET BEFORE DINNER 270 tablet 2  . glucose blood (ONE TOUCH ULTRA TEST) test strip Use as instructed to check sugar 2 times daily 200 each 5  . indomethacin (INDOCIN) 50 MG capsule Take 1 capsule (50 mg total) by mouth 2 (two) times daily with a meal. (Patient taking differently: Take 50 mg by mouth 2 (two) times daily as needed.) 60 capsule 1  . insulin degludec (TRESIBA FLEXTOUCH) 200 UNIT/ML FlexTouch Pen Inject 12 units daily under skin 9 mL 3  . Insulin Pen Needle 32G X 4 MM MISC Use 1x a day 100 each 3  . latanoprost (XALATAN) 0.005 % ophthalmic solution Place 1 drop into both eyes at bedtime.     Marland Kitchen lisinopril (PRINIVIL,ZESTRIL) 20 MG tablet Take 10 mg by mouth daily.    Marland Kitchen  metFORMIN (GLUCOPHAGE) 1000 MG tablet Take 1,000 mg by mouth 2 (two) times daily with a meal.    . Misc Natural Products (GLUCOSAMINE CHONDROITIN ADV PO) Take 2 tablets by mouth 2 (two) times daily.    . Multiple Vitamin (MULTIVITAMIN) tablet Take 1 tablet by mouth daily.    . mupirocin ointment (BACTROBAN) 2 % Apply to wound twice a day. 30 g 2  . Omega-3 Fatty Acids (FISH OIL) 1200 MG CAPS     . pravastatin (PRAVACHOL) 20 MG tablet Take 10 mg by mouth daily.     . sildenafil (REVATIO) 20 MG tablet TAKE 3 TO 5 TABLETS BY MOUTH ONCE DAILY AS NEEDED 50 tablet 11  . triamcinolone ointment (KENALOG) 0.5 % Apply 1 application topically 2 (two) times daily. 30 g 0  . ULORIC 40 MG tablet Take 40 mg by mouth daily.  4  . Vitamin D, Cholecalciferol, 1000 UNITS CAPS Take 1 capsule by mouth daily.     No facility-administered medications prior to visit.    PAST MEDICAL Lucas: Past Medical Lucas:  Diagnosis Date  . Allergic rhinitis   . Diabetes mellitus  type II   . Diverticulosis of colon   . Glaucoma    peripheral vision loss left eye.   Marland Kitchen Lucas of agent Orange exposure   . HLD (hyperlipidemia)   . HTN (hypertension)   . Hypertrophy of prostate without urinary obstruction and other lower urinary tract symptoms (LUTS)   . Osteoarthritis     PAST SURGICAL Lucas: Past Surgical Lucas:  Procedure Laterality Date  . TOOTH EXTRACTION      FAMILY Lucas: Family Lucas  Problem Relation Age of Onset  . Colon cancer Father   . Stroke Mother   . Heart attack Mother   . Coronary artery disease Mother   . Hypertension Other        several siblings  . Diabetes Other        several siblings  . Coronary artery disease Brother   . ALS Sister   . Dementia Sister   . Neuropathy Neg Hx     SOCIAL Lucas: Social Lucas   Socioeconomic Lucas  . Marital status: Married    Spouse name: Not on file  . Number of children: Not on file  . Years of education: Not on file  . Highest education level: Not on file  Occupational Lucas  . Occupation: Retired-[employer]  Tobacco Use  . Smoking status: Never Smoker  . Smokeless tobacco: Never Used  Substance and Sexual Activity  . Alcohol use: No    Alcohol/week: 0.0 standard drinks  . Drug use: No  . Sexual activity: Not on file  Other Topics Concern  . Not on file  Social Lucas Narrative   [place] vet- 20% disability from shrapnel wounds 1967 and 20% agent orange   Married (Widowed 1996; remarried 6/03)   Retired [place]      Has living will   Wife, then daughter Hoyle Barr will be health care POA   Would want attempts at resuscitation   Would not want feeding tube if cognitively unaware   Social Determinants of Health   Financial Resource Strain: Not on file  Food Insecurity: Not on file  Transportation Needs: Not on file  Physical Activity: Not on file  Stress: Not on file  Social Connections: Not on file  Intimate Partner Violence: Not on file      PHYSICAL  EXAM  Vitals:   03/30/20 1428  BP: Marland Kitchen)  150/88  Pulse: 78  Weight: 210 lb (95.3 kg)  Height: 5\' 11"  (1.803 m)   Body mass index is 29.29 kg/m.  Generalized: Well developed, in no acute distress   Neurological examination  Mentation: Alert oriented to time, place, Lucas taking. Follows all commands speech and language fluent Cranial nerve II-XII: Pupils were equal round reactive to light. Extraocular movements were full, visual field were full on confrontational test.  Head turning and shoulder shrug  were normal and symmetric. Motor: The motor testing reveals 5 over 5 strength of all 4 extremities. Good symmetric motor tone is noted throughout.  Sensory: Sensory testing is intact to soft touch on all 4 extremities. No evidence of extinction is noted.  Coordination: Cerebellar testing reveals good finger-nose-finger and heel-to-shin bilaterally.  Gait and station: Uses a walking stick slightly wide-based  reflexes: Deep tendon reflexes are symmetric and normal bilaterally.   DIAGNOSTIC DATA (LABS, IMAGING, TESTING) - I reviewed patient records, labs, notes, testing and imaging myself where available.  Lab Results  Component Value Date   WBC 3.9 (L) 09/02/2019   HGB 12.5 (L) 09/02/2019   HCT 38.3 (L) 09/02/2019   MCV 85.2 09/02/2019   PLT 221.0 09/02/2019      Component Value Date/Time   NA 140 10/27/2019 1550   K 5.3 (H) 10/27/2019 1550   CL 102 10/27/2019 1550   CO2 24 10/27/2019 1550   GLUCOSE 65 10/27/2019 1550   GLUCOSE 224 (H) 09/02/2019 1015   BUN 30 (H) 10/27/2019 1550   CREATININE 1.23 10/27/2019 1550   CALCIUM 9.3 10/27/2019 1550   PROT 7.0 10/27/2019 1550   ALBUMIN 4.2 09/02/2019 1015   AST 38 (H) 09/02/2019 1015   ALT 28 09/02/2019 1015   ALKPHOS 45 09/02/2019 1015   BILITOT 0.4 09/02/2019 1015   GFRNONAA 56 (L) 10/27/2019 1550   GFRAA 65 10/27/2019 1550   Lab Results  Component Value Date   CHOL 198 09/02/2019   HDL 34.10 (L) 09/02/2019   LDLCALC  104 07/13/2016   LDLDIRECT 98.0 09/02/2019   TRIG 349.0 (H) 09/02/2019   CHOLHDL 6 09/02/2019   Lab Results  Component Value Date   HGBA1C 7.8 (A) 03/01/2020   Lab Results  Component Value Date   VITAMINB12 503 10/27/2019   Lab Results  Component Value Date   TSH 2.790 10/27/2019      ASSESSMENT AND PLAN 79 y.o. year old male  has a past medical Lucas of Allergic rhinitis, Diabetes mellitus type II, Diverticulosis of colon, Glaucoma, Lucas of agent Orange exposure, HLD (hyperlipidemia), HTN (hypertension), Hypertrophy of prostate without urinary obstruction and other lower urinary tract symptoms (LUTS), and Osteoarthritis. here with :  1.  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  This included previsit chart review, lab review, study review, order entry, electronic health record documentation, patient education.  Ward Givens, MSN, NP-C 03/30/2020, 2:38 PM [facility] [address], [place], [place] [address] [phone]  Made any corrections needed, and agree with Lucas, physical, neuro exam,assessment and plan as stated.     Sarina Ill, MD [facility]

## 2020-03-30 NOTE — Patient Instructions (Signed)
Your Plan:  Continue to monitor symptoms Follow-up with PCP If your symptoms worsen or you develop new symptoms please let us know.   Thank you for coming to see [place] at [facility]. I hope we have been able to provide you high quality care today.  You may receive a patient satisfaction survey over the next few weeks. We would appreciate your feedback and comments so that we may continue to improve ourselves and the health of our patients.

## 2020-05-04 DIAGNOSIS — M1990 Unspecified osteoarthritis, unspecified site: Secondary | ICD-10-CM | POA: Diagnosis not present

## 2020-05-04 DIAGNOSIS — E119 Type 2 diabetes mellitus without complications: Secondary | ICD-10-CM | POA: Diagnosis not present

## 2020-05-04 DIAGNOSIS — M2011 Hallux valgus (acquired), right foot: Secondary | ICD-10-CM | POA: Diagnosis not present

## 2020-05-04 DIAGNOSIS — M1A9XX1 Chronic gout, unspecified, with tophus (tophi): Secondary | ICD-10-CM | POA: Diagnosis not present

## 2020-05-06 DIAGNOSIS — H401133 Primary open-angle glaucoma, bilateral, severe stage: Secondary | ICD-10-CM | POA: Diagnosis not present

## 2020-05-06 DIAGNOSIS — H401233 Low-tension glaucoma, bilateral, severe stage: Secondary | ICD-10-CM | POA: Diagnosis not present

## 2020-07-05 ENCOUNTER — Encounter: Payer: Self-pay | Admitting: Internal Medicine

## 2020-07-05 ENCOUNTER — Ambulatory Visit (INDEPENDENT_AMBULATORY_CARE_PROVIDER_SITE_OTHER): Payer: Medicare Other | Admitting: Internal Medicine

## 2020-07-05 ENCOUNTER — Other Ambulatory Visit: Payer: Self-pay

## 2020-07-05 VITALS — BP 120/78 | HR 88 | Ht 71.0 in | Wt 219.0 lb

## 2020-07-05 DIAGNOSIS — E1165 Type 2 diabetes mellitus with hyperglycemia: Secondary | ICD-10-CM

## 2020-07-05 DIAGNOSIS — E785 Hyperlipidemia, unspecified: Secondary | ICD-10-CM

## 2020-07-05 DIAGNOSIS — E669 Obesity, unspecified: Secondary | ICD-10-CM

## 2020-07-05 DIAGNOSIS — E1140 Type 2 diabetes mellitus with diabetic neuropathy, unspecified: Secondary | ICD-10-CM | POA: Diagnosis not present

## 2020-07-05 DIAGNOSIS — IMO0002 Reserved for concepts with insufficient information to code with codable children: Secondary | ICD-10-CM

## 2020-07-05 LAB — POCT GLYCOSYLATED HEMOGLOBIN (HGB A1C): Hemoglobin A1C: 6.7 % — AB (ref 4.0–5.6)

## 2020-07-05 NOTE — Patient Instructions (Signed)
Please continue: - Metformin 2000 mg with dinner - Glipizide XL 10 mg in a.m. and 5 mg before dinner - Trulicity 3 mg weekly - Tresiba 20 units daily  Please return in 4 months with your sugar log.

## 2020-07-05 NOTE — Progress Notes (Signed)
Patient ID: Lucas [name], male   DOB: 1941-03-26, 80 y.o.   MRN: [id number]  This visit occurred during the SARS-CoV-2 public health emergency.  Safety protocols were in place, including screening questions prior to the visit, additional usage of staff PPE, and extensive cleaning of exam room while observing appropriate contact time as indicated for disinfecting solutions.   HPI: Lucas [name] is a 80 y.o.-year-old male, presenting for f/u for DM2, dx in 1998, non-insulin-dependent (previous Agent Orange exposure in [place]), uncontrolled, with complications (mild CKD, foot ulcer). Last visit 4 months ago.  Interim hx: He is not very active and plans to increase his activity over the next few months.  He takes alpha-lipoic acid as advised by neurology.  This helps with neuropathy. No blurry vision, increased urination. He has constipation. On Miralax.   Reviewed HbA1c levels: Lab Results  Component Value Date   HGBA1C 7.8 (A) 03/01/2020   HGBA1C 7.8 (A) 10/27/2019   HGBA1C 8.2 (A) 07/21/2019   HGBA1C 7.6 (A) 03/17/2019   HGBA1C 8.1 (H) 09/24/2018   HGBA1C 7.9 (A) 03/18/2018   HGBA1C 7.6 (A) 12/07/2017   HGBA1C 7.8 08/06/2017   HGBA1C 7.4 05/10/2017   HGBA1C 8.3 11/29/2016   HGBA1C 7.7 05/08/2016   HGBA1C 7.7 12/17/2015   HGBA1C 8.8 (H) 07/28/2015   HGBA1C 7.2 02/05/2015   HGBA1C 6.7 11/05/2014   HGBA1C 9.1 (A) 06/29/2014   HGBA1C 8.7 (H) 01/13/2014   HGBA1C 7.9 (H) 07/14/2013   HGBA1C 7.6 (H) 12/31/2012   HGBA1C 7.8 (H) 07/02/2012   HGBA1C 8.4 (H) 02/27/2012   HGBA1C 7.9 (H) 12/06/2011   HGBA1C 8.1 (H) 06/06/2011   HGBA1C 7.7 (H) 02/16/2011   HGBA1C 8.4 (H) 11/15/2010   Pt is on a regimen of: - Metformin 2000 mg with dinner - Glipizide XL 10 mg before breakfast and 5 mg before dinner - Trulicity 1.5 >> 3 mg weekly - Tresiba 12  >> 20 units daily  - started 02/2020 He had to stop [place] as K increased (took 1 mo, off for 1.5 mo). Invokana was not covered.  Pt checks  his sugars 1-2 times a day - am:   73, 120, 129-180, 265 >> 147-195, 202, 210 >> 79, 89-148 - 2h after b'fast: 312 >> n/c >> 220 >> n/c >> 233 >> n/c - before lunch: 98, 134 >> 67 >> 84, 88-171, 212 >> n/c - 2h after lunch: 116, 140 >> n/c >> 114-178 >> n/c >> 154 - before dinner: 95-183 >> 85-163 >> 116-165, 172 >> n/c >> 95-123 - 2h after dinner:n/c  >> 160 >> 167, 172 >> 160 >> n/c - bedtime:  109-130 >> n/c >> 137 >> n/c - nighttime: n/c >> 165 >> n/c >> 117 >> n/c Lowest sugar was 60  >> 67 (delayed meal) >> 73 >> 79; he has hypoglycemia awareness in the 70s. Highest sugar was 200  - [place] >> 281  >> 200 x1  Glucometer: Precision >> One Touch Ultra mini >> [place] >> [employer]  Pt's meals are: - Breakfast:  Oatmeal, wheat English muffin  - Lunch: salad, 1/2 sandwich - Dinner: meat + veggies, added carbs back - Snacks: 2x a day, fruit bar, fresh fruit - snacking after dinner  + Mild CKD: Lab Results  Component Value Date   BUN 30 (H) 10/27/2019   CREATININE 1.23 10/27/2019  04/16/2017: 15/1.2 07/13/2016: BUN/Cr 13/1.0, Glu 116, UACR 0.5 11/10/2015: EGFR 80, creatinine 1.1 04/15/2015: GFR 88.1, Cr 1.06 On lisinopril.  +  HL; last set of lipids: Lab Results  Component Value Date   CHOL 198 09/02/2019   HDL 34.10 (L) 09/02/2019   LDLCALC 104 07/13/2016   LDLDIRECT 98.0 09/02/2019   TRIG 349.0 (H) 09/02/2019   CHOLHDL 6 09/02/2019  04/16/2016: 179/288/44/77 04/15/2015: LDL 89 On pravastatin 10. - last dilated eye exam was in 06/2019: No DR, + glaucoma. 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 - + numbness and tingling in his feet.  He was in a DM clinical trial before: "Jump Start" lifestyle Research Trial. He was on the [employer] and lifestyle program in the past.  He also has a history of HTN, ED. He has gout - prev. on Alopurinol >> then  on Uloric(he had a rash with allopurinol) He had a R foot ulcer 04/2017 and again in 11/2017. He is seen by a  vascular dr.  Lynnell Dike were normal then. In 11/2017 he was getting Krysexxa injections for gout.  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  ROS: Constitutional: no weight gain/no weight loss, no fatigue, no subjective hyperthermia, no subjective hypothermia Eyes: no blurry vision, no xerophthalmia ENT: no sore throat, no nodules palpated in neck, no dysphagia, no odynophagia, no hoarseness Cardiovascular: no CP/no SOB/no palpitations/no leg swelling Respiratory: no cough/no SOB/no wheezing Gastrointestinal: no N/no V/no D/no C/no acid reflux Musculoskeletal: no muscle aches/no joint aches Skin: no rashes, no hair loss Neurological: no tremors/+ numbness/+ tingling/no dizziness  I reviewed pt's medications, allergies, PMH, social hx, family hx, and changes were documented in the history of present illness. Otherwise, unchanged from my initial visit note.  Past Medical History:  Diagnosis Date  . Allergic rhinitis   . Diabetes mellitus type II   . Diverticulosis of colon   . Glaucoma    peripheral vision loss left eye.   Marland Kitchen History of agent Orange exposure   . HLD (hyperlipidemia)   . HTN (hypertension)   . Hypertrophy of prostate without urinary obstruction and other lower urinary tract symptoms (LUTS)   . Osteoarthritis    Past Surgical History:  Procedure Laterality Date  . TOOTH EXTRACTION       History   Social History  . Marital Status: Married    Spouse Name: N/A  . Number of Children: 2   Occupational History  . Retired-[employer]    Social History Main Topics  . Smoking status: Never Smoker   . Smokeless tobacco: Never Used  . Alcohol Use: No  . Drug Use: No    [place] vet- 20% disability from shrapnel wounds 1967 and 20% agent orange   Married (Widowed 1996; remarried 6/03)   Retired [employer]      Has living will   Wife, then daughter Hoyle Barr will be health care POA   Would want attempts at resuscitation   Would not want feeding tube if  cognitively unaware   Current Outpatient Medications on File Prior to Visit  Medication Sig Dispense Refill  . albuterol (VENTOLIN HFA) 108 (90 Base) MCG/ACT inhaler Inhale 2 puffs into the lungs every 4 (four) hours as needed.    . Alpha-Lipoic Acid 600 MG CAPS Take 1 capsule (600 mg total) by mouth in the morning and at bedtime. 60 capsule 3  . aspirin 81 MG tablet Take 81 mg by mouth daily.    . brimonidine (ALPHAGAN) 0.2 % ophthalmic solution Place 1 drop into both eyes 2 (two) times daily.     Marland Kitchen  cetirizine (ZYRTEC) 10 MG tablet Take 10 mg by mouth daily.    . colchicine 0.6 MG tablet Take 0.6 mg by mouth as needed. Take 2 tablets by mouth at first sign of gout flare followed by 1 tablet after 1 hour. Next day 1 tab;et for 5 days    . COMBIGAN 0.2-0.5 % ophthalmic solution     . Dulaglutide (TRULICITY) 3 MC/9.4BS SOPN Inject 3 mg into the skin once a week. 12 pen 3  . fluticasone (FLONASE) 50 MCG/ACT nasal spray     . gabapentin (NEURONTIN) 300 MG capsule TAKE 2 CAPSULES (600 MG TOTAL) BY MOUTH 3 (THREE) TIMES DAILY. 540 capsule 3  . glipiZIDE (GLUCOTROL XL) 5 MG 24 hr tablet TAKE 2 TABLETS BY MOUTH IN THE AM AND 1 TABLET BEFORE DINNER 270 tablet 2  . glucose blood (ONE TOUCH ULTRA TEST) test strip Use as instructed to check sugar 2 times daily 200 each 5  . indomethacin (INDOCIN) 50 MG capsule Take 1 capsule (50 mg total) by mouth 2 (two) times daily with a meal. (Patient taking differently: Take 50 mg by mouth 2 (two) times daily as needed.) 60 capsule 1  . insulin degludec (TRESIBA FLEXTOUCH) 200 UNIT/ML FlexTouch Pen Inject 12 units daily under skin (Patient taking differently: 20 Units. Inject 20 units daily under skin) 9 mL 3  . Insulin Pen Needle 32G X 4 MM MISC Use 1x a day 100 each 3  . latanoprost (XALATAN) 0.005 % ophthalmic solution Place 1 drop into both eyes at bedtime.     Marland Kitchen lisinopril (PRINIVIL,ZESTRIL) 20 MG tablet Take 10 mg by mouth daily.    . metFORMIN (GLUCOPHAGE) 1000  MG tablet Take 1,000 mg by mouth 2 (two) times daily with a meal.    . Misc Natural Products (GLUCOSAMINE CHONDROITIN ADV PO) Take 2 tablets by mouth 2 (two) times daily.    . Multiple Vitamin (MULTIVITAMIN) tablet Take 1 tablet by mouth daily.    . mupirocin ointment (BACTROBAN) 2 % Apply to wound twice a day. 30 g 2  . Omega-3 Fatty Acids (FISH OIL) 1200 MG CAPS     . pravastatin (PRAVACHOL) 20 MG tablet Take 10 mg by mouth daily.     . sildenafil (REVATIO) 20 MG tablet TAKE 3 TO 5 TABLETS BY MOUTH ONCE DAILY AS NEEDED 50 tablet 11  . triamcinolone ointment (KENALOG) 0.5 % Apply 1 application topically 2 (two) times daily. 30 g 0  . ULORIC 40 MG tablet Take 40 mg by mouth daily.  4  . Vitamin D, Cholecalciferol, 1000 UNITS CAPS Take 1 capsule by mouth daily.     No current facility-administered medications on file prior to visit.   No Known Allergies Family History  Problem Relation Age of Onset  . Colon cancer Father   . Stroke Mother   . Heart attack Mother   . Coronary artery disease Mother   . Hypertension Other        several siblings  . Diabetes Other        several siblings  . Coronary artery disease Brother   . ALS Sister   . Dementia Sister   . Neuropathy Neg Hx    PE: BP 120/78 (BP Location: Right Arm, Patient Position: Sitting, Cuff Size: Normal)   Pulse 88   Ht _0  (1.803 m)   Wt 219 lb (99.3 kg)   SpO2 99%   BMI 30.54 kg/m  Body mass index is 30.54 kg/m. Wt  Readings from Last 3 Encounters:  07/05/20 219 lb (99.3 kg)  03/30/20 210 lb (95.3 kg)  03/01/20 217 lb 6.4 oz (98.6 kg)   Constitutional: overweight, in NAD Eyes: PERRLA, EOMI, no exophthalmos ENT: moist mucous membranes, no thyromegaly, no cervical lymphadenopathy Cardiovascular: RRR, No MRG Respiratory: CTA B Gastrointestinal: abdomen soft, NT, ND, BS+ Musculoskeletal: no deformities, strength intact in all 4 Skin: moist, warm, no rashes Neurological: no tremor with outstretched hands, DTR  normal in all 4  ASSESSMENT: 1. DM2, non-insulin-dependent, uncontrolled, with complications - R foot ulcer. Nl ABIs - mild CKD  2. HL  3.  Obesity class I  PLAN:  1. Patient with longstanding, uncontrolled, type 2 diabetes, on oral antidiabetic regimen with Metformin and sulfonylurea and also weekly GLP-1 receptor agonist, to which we added long-acting insulin at last visit.  At that time, sugars were consistently above target, even occasionally in the 200s.  He was reticent to start insulin, but he did start [place] and titrate the dose up since last visit.  He tolerates it well and he did not have any low blood sugars.  Sugars improved significantly afterwards, especially after increasing the dose to 20 units. -At this visit, sugars are mostly at goal with few mildly hyperglycemic values in a.m., but for now, due to the significant improvement in the blood sugars, we will continue the current regimen.  Going forward, if increases his activity and improves his diet, my goal is to start decreasing glipizide and [place]. - I suggested to:  Patient Instructions  Please continue: - Metformin 2000 mg with dinner - Glipizide XL 10 mg in a.m. and 5 mg before dinner - Trulicity 3 mg weekly - Tresiba 20 units daily  Please return in 4 months with your sugar log.   - we checked his HbA1c: 6.7% (lower) - advised to check sugars at different times of the day - 1-2x a day, rotating check times - advised for yearly eye exams >> he is UTD - return to clinic in 4 months  2. HL -Reviewed latest lipid panel from 08/2019: LDL above our goal of less than 70, triglycerides high, HDL low Lab Results  Component Value Date   CHOL 198 09/02/2019   HDL 34.10 (L) 09/02/2019   LDLCALC 104 07/13/2016   LDLDIRECT 98.0 09/02/2019   TRIG 349.0 (H) 09/02/2019   CHOLHDL 6 09/02/2019  -He continues on pravastatin 10 mg daily without side effects.  Also on omega-3 fatty acids. -He needs another lipid  panel-will check at next visit.  His triglycerides were most likely improve after starting insulin  3. Obesity class I -We will continue Trulicity which should also help with weight loss -She gained 9 pounds since last visit, after starting insulin -He is planning to start to be more active, which should also help.  Philemon Kingdom, MD PhD [facility]

## 2020-07-19 ENCOUNTER — Encounter: Payer: Self-pay | Admitting: Podiatry

## 2020-07-19 ENCOUNTER — Other Ambulatory Visit: Payer: Self-pay

## 2020-07-19 ENCOUNTER — Ambulatory Visit (INDEPENDENT_AMBULATORY_CARE_PROVIDER_SITE_OTHER): Payer: Medicare Other | Admitting: Podiatry

## 2020-07-19 DIAGNOSIS — F528 Other sexual dysfunction not due to a substance or known physiological condition: Secondary | ICD-10-CM | POA: Insufficient documentation

## 2020-07-19 DIAGNOSIS — E1142 Type 2 diabetes mellitus with diabetic polyneuropathy: Secondary | ICD-10-CM | POA: Diagnosis not present

## 2020-07-19 DIAGNOSIS — D126 Benign neoplasm of colon, unspecified: Secondary | ICD-10-CM | POA: Insufficient documentation

## 2020-07-19 DIAGNOSIS — J329 Chronic sinusitis, unspecified: Secondary | ICD-10-CM | POA: Insufficient documentation

## 2020-07-19 DIAGNOSIS — R2681 Unsteadiness on feet: Secondary | ICD-10-CM

## 2020-07-19 DIAGNOSIS — H409 Unspecified glaucoma: Secondary | ICD-10-CM | POA: Insufficient documentation

## 2020-07-19 DIAGNOSIS — Z800 Family history of malignant neoplasm of digestive organs: Secondary | ICD-10-CM | POA: Insufficient documentation

## 2020-07-19 DIAGNOSIS — R2689 Other abnormalities of gait and mobility: Secondary | ICD-10-CM

## 2020-07-19 DIAGNOSIS — F411 Generalized anxiety disorder: Secondary | ICD-10-CM | POA: Insufficient documentation

## 2020-07-19 DIAGNOSIS — D7289 Other specified disorders of white blood cells: Secondary | ICD-10-CM | POA: Insufficient documentation

## 2020-07-19 NOTE — Progress Notes (Signed)
He presents today for follow-up of his diabetes.  He states that his blood sugar has been doing pretty well but could be a little bit better.  Denies any changes in his feet.  Objective: Vital signs are stable he is alert oriented x3.  Pulses are palpable.  Capillary fill time is immediate.  Neurologic sensorium is intact per Semmes Weinstein monofilament all epicritic sensation is Tagg vibratory sensation is intact proprioceptive sensation is intact muscle strength dorsiflexors plantar flexors inverters everters all intrinsic musculature is intact orthopedic evaluation straits all joints distal to the ankle full range of motion without crepitation.  Cutaneous evaluation demonstrates supple well-hydrated cutis no open lesions wounds lacerations.  Assessment: Early diabetic peripheral neuropathy with epicritic sensation intact.  Plan: Follow-up with [place] in another 6 months.  Call us with questions or concerns.  I did provide him with another prescription for balance and gait training he states that he would like to try this for strengthening of his thighs.

## 2020-08-01 ENCOUNTER — Other Ambulatory Visit: Payer: Self-pay | Admitting: Internal Medicine

## 2020-08-09 ENCOUNTER — Ambulatory Visit: Payer: Medicare Other | Attending: Podiatry

## 2020-08-09 VITALS — BP 106/81 | HR 79 | Ht 71.0 in | Wt 215.0 lb

## 2020-08-09 DIAGNOSIS — R2689 Other abnormalities of gait and mobility: Secondary | ICD-10-CM | POA: Insufficient documentation

## 2020-08-09 DIAGNOSIS — R269 Unspecified abnormalities of gait and mobility: Secondary | ICD-10-CM | POA: Insufficient documentation

## 2020-08-09 DIAGNOSIS — R262 Difficulty in walking, not elsewhere classified: Secondary | ICD-10-CM | POA: Diagnosis not present

## 2020-08-09 DIAGNOSIS — M6281 Muscle weakness (generalized): Secondary | ICD-10-CM | POA: Diagnosis not present

## 2020-08-09 DIAGNOSIS — R278 Other lack of coordination: Secondary | ICD-10-CM | POA: Diagnosis not present

## 2020-08-09 DIAGNOSIS — R2681 Unsteadiness on feet: Secondary | ICD-10-CM | POA: Diagnosis not present

## 2020-08-09 NOTE — Therapy (Signed)
[place] MAIN [facility] SERVICES [address] [place], [place], [address] Phone: [phone]   Fax:  [phone]  Physical Therapy Evaluation  Patient Details  Name: Lucas [name] MRN: [id number] Date of Birth: [birth year] Referring Provider (PT): Dr. Bennie Pierini T. Hyatt   Encounter Date: 08/09/2020   PT End of Session - 08/09/20 1627    Visit Number 1    Number of Visits 25    Date for PT Re-Evaluation 11/01/20    Authorization Time Period 08/09/2020- 11/01/2020    PT Start Time 1602    Equipment Utilized During Treatment Gait belt    Activity Tolerance Patient tolerated treatment well    Behavior During Therapy [facility] for tasks assessed/performed           Past Medical History:  Diagnosis Date  . Allergic rhinitis   . Diabetes mellitus type II   . Diverticulosis of colon   . Glaucoma    peripheral vision loss left eye.   Marland Kitchen History of agent Orange exposure   . HLD (hyperlipidemia)   . HTN (hypertension)   . Hypertrophy of prostate without urinary obstruction and other lower urinary tract symptoms (LUTS)   . Osteoarthritis     Past Surgical History:  Procedure Laterality Date  . TOOTH EXTRACTION      Vitals:   08/09/20 1919  BP: 106/81  Pulse: 79  SpO2: 97%  Weight: 215 lb (97.5 kg)  Height: 5\' 11"  (1.803 m)      Subjective Assessment - 08/09/20 1610    Subjective Patient reports he is here due to poor balance and states he has PN and using his cane for prolonged walking. He reports he is retired and living fairly sedentary these days.    Pertinent History Patient has referrral from Podiatry for balance and gait impairments and past medical history including HTN, Obstructive sleep apnea, Sinusitis, Diverticulosis, Diabetes with neuropathy, Osteoarthritis, DDD (lumbar), Gout, BPH, HLD, Glaucoma.    Limitations Lifting;Standing;Walking    How long can you sit comfortably? No limits    How long can you stand comfortably? 10 min    How  long can you walk comfortably? 10 min    Patient Stated Goals I want to be able to walk a mile and not lose my balance.    Currently in Pain? Other (Comment)   Patient reports low back stiffness - worse in the morning             [facility] PT Assessment - 08/09/20 1613      Assessment   Medical Diagnosis Gait instability and balance problems    Referring Provider (PT) Dr. Bennie Pierini T. Hyatt    Onset Date/Surgical Date 09/09/19    Hand Dominance Right    Next MD Visit 09/03/2020- PCP    Prior Therapy no      Precautions   Precautions Fall      Restrictions   Weight Bearing Restrictions No      Balance Screen   Has the patient fallen in the past 6 months No    Has the patient had a decrease in activity level because of a fear of falling?  Yes    Is the patient reluctant to leave their home because of a fear of falling?  No      Home Environment   Living Environment Private residence    Living Arrangements Spouse/significant other    Available Help at Discharge Family;Friend(s)    Type of Home  House    Home Access Stairs to enter    Entrance Stairs-Number of Steps 2    Entrance Stairs-Rails Right;Left;Can reach both    Home Layout Two level   has basement   Alternate Level Stairs-Number of Steps 2    Alternate Level Stairs-Rails Can reach both;Right;Left   [employer] - single point   chair lift     Prior Function   Level of Independence Independent    Vocation Retired      Associate Professor   Overall Cognitive Status Within Functional Limits for tasks assessed    Attention Focused    Focused Attention Appears intact    Memory Appears intact    Awareness Appears intact    Problem Solving Appears intact    Executive Function Reasoning;Sequencing;Decision Making;Organizing;Initiating;Self Monitoring;Self Correcting    Reasoning Appears intact    Sequencing Appears intact    Organizing Appears intact    Decision Making Appears intact    Initiating Appears intact     Self Monitoring Appears intact    Self Correcting Appears intact      Observation/Other Assessments   Focus on Therapeutic Outcomes (FOTO)  60      Sensation   Light Touch Impaired Detail   impaired by light touch.         OBJECTIVE  MUSCULOSKELETAL: Tremor: Absent Bulk: Normal Tone: Normal, no clonus  Posture Forward head/rounded shoulders  Gait Wide base of support with decreased step length and unsteadiness.   Strength R/L 4/3+ Hip flexion 4/3+ Hip external rotation 4/3+ Hip internal rotation 4/3+ Hip extension  4/3+Hip abduction 4/3+ Hip adduction 4/4 Knee extension 4/4 Knee flexion 4/4Ankle Plantarflexion 4/4  Ankle Dorsiflexion   NEUROLOGICAL:  Mental Status Patient is oriented to person, place and time.  Recent memory is intact.  Remote memory is intact.  Attention span and concentration are intact.  Expressive speech is intact.  Patient's fund of knowledge is within normal limits for educational level.   Sensation Grossly intact to light touch bilateral UEs and decreased with light touch with BLEs as determined by testing dermatomes C2-T2/L2-S2 respectively Proprioception and hot/cold testing deferred on this date  Coordination/Cerebellar Finger to Nose: WNL Heel to Shin: WNL Rapid alternating movements: WNL Finger Opposition: WNL  FUNCTIONAL OUTCOME MEASURES   Results Comments          FGA 19/30   TUG  16.03 seconds No AD  5TSTS  23 seconds Min BUE support on knees  6 Minute Walk Test 945 feet Use of walking stick  10 Meter Gait Speed Self-selected: 11.93 s =  0.83 m/s;  Below normative values for full community ambulation      ASSESSMENT Clinical Impression: Pt is a pleasant  80 year-old male referred for difficulty with balance and gait. PT examination reveals deficits . Pt presents with deficits in LE strength, gait and balance as seen by 5x STS test, Decreased gait speed of 0.83 m/s, impaired balance as seen by score of 19/30 on  FGA and decreased functional endurance as seen by decreased 6 min walk test.  Pt will benefit from skilled PT services to address deficits in balance and decrease risk for future falls and improve quality of life.                           Objective measurements completed on examination: See above findings.  PT Education - 08/09/20 2035    Education Details Plan of care; Purpose of functional outcome testing    Person(s) Educated Patient    Methods Explanation    Comprehension Verbalized understanding            PT Short Term Goals - 08/09/20 2004      PT SHORT TERM GOAL #1   Title Pt will be independent with HEP in order to improve strength and balance in order to decrease fall risk and improve function at home and work.    Baseline 08/09/2020- Patient with no HEP in place    Time 6    Period Weeks    Status New    Target Date 09/20/20             PT Long Term Goals - 08/09/20 2005      PT LONG TERM GOAL #1   Title Pt will improve FOTO to  target score of 63  to display perceived improvements in ability to complete ADL's and improved self- perception of functional capabilities    Baseline 08/09/2020= 60    Time 12    Period Weeks    Status New    Target Date 11/01/20      PT LONG TERM GOAL #2   Title Patient will increase Functional Gait Assessment score to >23/30 as to reduce fall risk and improve dynamic gait safety with community ambulation.    Baseline 08/09/2020- FGA 19/30    Time 12    Period Weeks    Status New    Target Date 11/01/20      PT LONG TERM GOAL #3   Title Pt will decrease 5TSTS by at least 3 seconds in order to demonstrate clinically significant improvement in LE strength.    Baseline 08/09/2020= 23 sec with BUE support (hands on knees)    Time 12    Period Weeks    Status New    Target Date 11/01/20      PT LONG TERM GOAL #4   Title Pt will decrease TUG to below 14 seconds/decrease in order to  demonstrate decreased fall risk.    Baseline 08/09/2020- 16.03 sec without an AD    Time 12    Period Weeks    Status New    Target Date 11/01/20      PT LONG TERM GOAL #5   Title Pt will increase 6MWT by at least 1m (185ft) in order to demonstrate clinically significant improvement in cardiopulmonary endurance and community ambulation    Baseline 08/09/2020- 945 feet - Completed test.    Time 12    Period Weeks    Status New    Target Date 11/01/20                  Plan - 08/09/20 1628    Clinical Impression Statement Pt is a pleasant  80 year-old male referred for difficulty with balance and gait. PT examination reveals deficits . Pt presents with deficits in LE strength, gait and balance as seen by 5x STS test, Decreased gait speed of 0.83 m/s, impaired balance as seen by score of 19/30 on FGA and decreased functional endurance as seen by decreased 6 min walk test.  Pt will benefit from skilled PT services to address deficits in balance and decrease risk for future falls and improve quality of life.    Personal Factors and Comorbidities Age;Comorbidity 3+    Comorbidities HTN, DM with neuropathy, Osteoarthritis  Examination-Activity Limitations Carry;Lift;Squat;Stand    Examination-Participation Restrictions Cleaning;Community Activity;Yard Work    Stability/Clinical Decision Making Stable/Uncomplicated    Surveyor, mining    Rehab Potential Good    PT Frequency 2x / week    PT Duration 12 weeks    PT Treatment/Interventions ADLs/Self Care Home Management;Cryotherapy;DME Instruction;Gait training;Stair training;Therapeutic activities;Functional mobility training;Therapeutic exercise;Balance training;Neuromuscular re-education;Patient/family education;Manual techniques;Passive range of motion;Dry needling    PT Next Visit Plan initiate HEP including LE strengthening and balance    PT Home Exercise Plan To be initiated next visit    Consulted and Agree with Plan of  Care Patient           Patient will benefit from skilled therapeutic intervention in order to improve the following deficits and impairments:  Abnormal gait,Cardiopulmonary status limiting activity,Decreased activity tolerance,Decreased balance,Decreased endurance,Decreased mobility,Decreased strength,Difficulty walking,Impaired sensation,Impaired vision/preception  Visit Diagnosis: Abnormality of gait and mobility  Difficulty in walking, not elsewhere classified  Muscle weakness (generalized)  Unsteadiness on feet     Problem List Patient Active Problem List   Diagnosis Date Noted  . Anxiety state 07/19/2020  . Benign neoplasm of colon 07/19/2020  . Chronic sinusitis 07/19/2020  . Family history of malignant neoplasm of gastrointestinal tract 07/19/2020  . Glaucoma 07/19/2020  . 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 Psychosexual dysfunction with inhibited sexual excitement 07/19/2020  . Diabetic polyneuropathy associated with type 2 diabetes mellitus ([place]) 10/27/2019  . Sensory ataxia 10/27/2019  . Uncontrolled diabetes mellitus with diabetic neuropathy ([place]) 09/02/2019  . Obesity, Class I, BMI 30-34.9 12/07/2017  . DDD (degenerative disc disease), lumbar 05/16/2016  . Advance directive discussed with patient 07/28/2015  . Gouty arthropathy, chronic, without tophi 07/08/2015  . Hypertension 07/08/2015  . Obstructive sleep apnea 07/22/2014  . Routine general medical examination at a health care facility 06/06/2011  . BPH with obstruction/lower urinary tract symptoms   . Generalized osteoarthritis of multiple sites 09/20/2006  . Hyperlipidemia 09/17/2006  . GLAUCOMA 09/17/2006  . ALLERGIC RHINITIS 09/17/2006  . DIVERTICULOSIS, COLON 09/17/2006    Lewis Moccasin, PT 08/09/2020, 8:36 PM  [place] MAIN [facility] SERVICES [address] [place], [place], [address] Phone: [phone]   Fax:   [phone]  Name: Lucas [name] MRN: [id number] Date of Birth: Jul 09, 1940

## 2020-08-11 ENCOUNTER — Ambulatory Visit: Payer: Medicare Other

## 2020-08-11 ENCOUNTER — Other Ambulatory Visit: Payer: Self-pay

## 2020-08-11 DIAGNOSIS — R2689 Other abnormalities of gait and mobility: Secondary | ICD-10-CM | POA: Diagnosis not present

## 2020-08-11 DIAGNOSIS — R2681 Unsteadiness on feet: Secondary | ICD-10-CM | POA: Diagnosis not present

## 2020-08-11 DIAGNOSIS — R269 Unspecified abnormalities of gait and mobility: Secondary | ICD-10-CM

## 2020-08-11 DIAGNOSIS — R262 Difficulty in walking, not elsewhere classified: Secondary | ICD-10-CM | POA: Diagnosis not present

## 2020-08-11 DIAGNOSIS — R278 Other lack of coordination: Secondary | ICD-10-CM | POA: Diagnosis not present

## 2020-08-11 DIAGNOSIS — M6281 Muscle weakness (generalized): Secondary | ICD-10-CM

## 2020-08-11 NOTE — Therapy (Signed)
[place] MAIN [facility] SERVICES [address] [place], [place], [address] Phone: [phone]   Fax:  [phone]  Physical Therapy Treatment  Patient Details  Name: Lucas [name] MRN: [id number] Date of Birth: May 25, 1940 Referring Provider (PT): Dr. Bennie Pierini T. Hyatt   Encounter Date: 08/11/2020   PT End of Session - 08/11/20 1607    Visit Number 2    Number of Visits 25    Date for PT Re-Evaluation 11/01/20    Authorization Time Period 08/09/2020- 11/01/2020    PT Start Time 1601    PT Stop Time 1650    PT Time Calculation (min) 49 min    Equipment Utilized During Treatment Gait belt    Activity Tolerance Patient tolerated treatment well    Behavior During Therapy WFL for tasks assessed/performed           Past Medical History:  Diagnosis Date  . Allergic rhinitis   . Diabetes mellitus type II   . Diverticulosis of colon   . Glaucoma    peripheral vision loss left eye.   Marland Kitchen History of agent Orange exposure   . HLD (hyperlipidemia)   . HTN (hypertension)   . Hypertrophy of prostate without urinary obstruction and other lower urinary tract symptoms (LUTS)   . Osteoarthritis     Past Surgical History:  Procedure Laterality Date  . TOOTH EXTRACTION      There were no vitals filed for this visit.   Subjective Assessment - 08/11/20 1605    Subjective Patient reports feeling okay today with no new issues.    Pertinent History Patient has referrral from Podiatry for balance and gait impairments and past medical history including HTN, Obstructive sleep apnea, Sinusitis, Diverticulosis, Diabetes with neuropathy, Osteoarthritis, DDD (lumbar), Gout, BPH, HLD, Glaucoma.    Limitations Lifting;Standing;Walking    How long can you sit comfortably? No limits    How long can you stand comfortably? 10 min    How long can you walk comfortably? 10 min    Patient Stated Goals I want to be able to walk a mile and not lose my balance.    Currently in  Pain? No/denies           Interventions:   Therapeutic Exercises:   Standing Hip march Stand Hip ext Stand hip abd Stand knee flex Stand Mini squat Stand calf raises Stand toe raises Patient instructed in 10-12 reps each leg. Education provided throughout session via VC/TC and demonstration to facilitate movement at target joints and correct muscle activation for all testing and exercises performed. Patient reported no pain or significant difficulty today- able to complete all above exercises well with cues.        Access Code: [email or web address] URL: [email or web address] Date: 08/11/2020 Prepared by: Sande Brothers, PT Exercises Standing March with Counter Support - 1 x daily - 5 x weekly - 3 sets - 10-12 reps - 2 hold Standing Hip Extension with Counter Support - 1 x daily - 5 x weekly - 3 sets - 10-12 reps - 2 hold Standing Hip Abduction with Counter Support - 1 x daily - 5 x weekly - 3 sets - 10-12 reps - 2 hold Standing Knee Flexion with Counter Support - 1 x daily - 5 x weekly - 3 sets - 10-12 reps - 2 hold Mini Squat with Counter Support - 1 x daily - 5 x weekly - 3 sets - 10-12 reps - 2 hold Heel rises with counter support -  1 x daily - 5 x weekly - 3 sets - 10-12 reps - 2 hold Toe Raises with Counter Support - 1 x daily - 5 x weekly - 3 sets - 10-12 reps - 2 hold   Neuromuscular Re-ed:  Patient instructed in and performed - static stand with varying feet position, eyes open or closed, head turns-   Feet apart- EO x 20 sec, EC x 20 sec, EO with head turn/nods, EC with head turn/nods- Mild increased difficulty with EC and head turns with increased sway and retropulsion.  Feet narrowed- same as above with increased sway yet no loss of balance - increased difficulty with eyes closed/head nod Feet staggered- Same as above except only eyes open due to increased sway and min loss of balance with self recovery.     Access Code: [email or web address] URL:  [email or web address] Date: 08/11/2020 Prepared by: Berdie Ogren  Exercises Standing Balance in Corner - 1 x daily - 3 x weekly - 3 sets - 20 hold Standing Balance in Corner with Eyes Closed - 1 x daily - 3 x weekly - 3 sets - 20 hold Standing Near Stance in Corner with Eyes Closed - 1 x daily - 3 x weekly - 3 sets - 20 hold Standing Near Stance in Corner - 1 x daily - 3 x weekly - 3 sets - 20 hold Tandem Stance in Corner - 1 x daily - 3 x weekly - 3 sets - 20 hold Tandem Stance with Eyes Closed in Corner - 1 x daily - 3 x weekly - 3 sets - 20 hold Standing with Head Rotation - 1 x daily - 3 x weekly - 3 sets - 20 hold      Clinical impression: Patient performed well with standing LE exercises and initial balance exercises with visual demo and VC/TC today. He was able to complete all activities with brief rest break and no significant difficulty. He will benefit from review/progresss next visit as appropriate to ensure he is able to transition these exercises effectively to his home program. Pt will benefit from skilled PT services to address deficits in balance and decrease risk for future falls and improve quality of life.            PT Education - 08/11/20 1606    Education Details specific exercise technique    Person(s) Educated Patient    Methods Explanation;Demonstration;Tactile cues;Verbal cues;Handout    Comprehension Verbalized understanding;Returned demonstration;Verbal cues required;Need further instruction            PT Short Term Goals - 08/09/20 2004      PT SHORT TERM GOAL #1   Title Pt will be independent with HEP in order to improve strength and balance in order to decrease fall risk and improve function at home and work.    Baseline 08/09/2020- Patient with no HEP in place    Time 6    Period Weeks    Status New    Target Date 09/20/20             PT Long Term Goals - 08/09/20 2005      PT LONG TERM GOAL #1   Title Pt will  improve FOTO to  target score of 63  to display perceived improvements in ability to complete ADL's and improved self- perception of functional capabilities    Baseline 08/09/2020= 60    Time 12    Period Weeks    Status New    Target Date 11/01/20  PT LONG TERM GOAL #2   Title Patient will increase Functional Gait Assessment score to >23/30 as to reduce fall risk and improve dynamic gait safety with community ambulation.    Baseline 08/09/2020- FGA 19/30    Time 12    Period Weeks    Status New    Target Date 11/01/20      PT LONG TERM GOAL #3   Title Pt will decrease 5TSTS by at least 3 seconds in order to demonstrate clinically significant improvement in LE strength.    Baseline 08/09/2020= 23 sec with BUE support (hands on knees)    Time 12    Period Weeks    Status New    Target Date 11/01/20      PT LONG TERM GOAL #4   Title Pt will decrease TUG to below 14 seconds/decrease in order to demonstrate decreased fall risk.    Baseline 08/09/2020- 16.03 sec without an AD    Time 12    Period Weeks    Status New    Target Date 11/01/20      PT LONG TERM GOAL #5   Title Pt will increase 6MWT by at least 50m (138ft) in order to demonstrate clinically significant improvement in cardiopulmonary endurance and community ambulation    Baseline 08/09/2020- 945 feet - Completed test.    Time 12    Period Weeks    Status New    Target Date 11/01/20                 Plan - 08/11/20 1343    Clinical Impression Statement Patient performed well with standing LE exercises and initial balance exercises with visual demo and VC/TC today. He was able to complete all activities with brief rest break and no significant difficulty. He will benefit from review/progresss next visit as appropriate to ensure he is able to transition these exercises effectively to his home program. Pt will benefit from skilled PT services to address deficits in balance and decrease risk for future falls and improve  quality of life.    Personal Factors and Comorbidities Age;Comorbidity 3+    Comorbidities HTN, DM with neuropathy, Osteoarthritis    Examination-Activity Limitations Carry;Lift;Squat;Stand    Examination-Participation Restrictions Cleaning;Community Activity;Yard Work    Stability/Clinical Decision Making Stable/Uncomplicated    Rehab Potential Good    PT Frequency 2x / week    PT Duration 12 weeks    PT Treatment/Interventions ADLs/Self Care Home Management;Cryotherapy;DME Instruction;Gait training;Stair training;Therapeutic activities;Functional mobility training;Therapeutic exercise;Balance training;Neuromuscular re-education;Patient/family education;Manual techniques;Passive range of motion;Dry needling    PT Next Visit Plan initiate HEP including LE strengthening and balance    PT Home Exercise Plan Access Code: [email or web address]  URL: [email or web address]  Date: 08/11/2020 Access Code: [email or web address]  URL: [email or web address]  Date: 08/11/2020    Consulted and Agree with Plan of Care Patient           Patient will benefit from skilled therapeutic intervention in order to improve the following deficits and impairments:  Abnormal gait,Cardiopulmonary status limiting activity,Decreased activity tolerance,Decreased balance,Decreased endurance,Decreased mobility,Decreased strength,Difficulty walking,Impaired sensation,Impaired vision/preception  Visit Diagnosis: Abnormality of gait and mobility  Difficulty in walking, not elsewhere classified  Muscle weakness (generalized)  Other lack of coordination     Problem List Patient Active Problem List   Diagnosis Date Noted  . Anxiety state 07/19/2020  . Benign neoplasm of colon 07/19/2020  . Chronic sinusitis 07/19/2020  . Family history of malignant neoplasm of gastrointestinal tract 07/19/2020  . Glaucoma 07/19/2020  .  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 Psychosexual dysfunction with  inhibited sexual excitement 07/19/2020  . Diabetic polyneuropathy associated with type 2 diabetes mellitus ([place]) 10/27/2019  . Sensory ataxia 10/27/2019  . Uncontrolled diabetes mellitus with diabetic neuropathy ([place]) 09/02/2019  . Obesity, Class I, BMI 30-34.9 12/07/2017  . DDD (degenerative disc disease), lumbar 05/16/2016  . Advance directive discussed with patient 07/28/2015  . Gouty arthropathy, chronic, without tophi 07/08/2015  . Hypertension 07/08/2015  . Obstructive sleep apnea 07/22/2014  . Routine general medical examination at a health care facility 06/06/2011  . BPH with obstruction/lower urinary tract symptoms   . Generalized osteoarthritis of multiple sites 09/20/2006  . Hyperlipidemia 09/17/2006  . GLAUCOMA 09/17/2006  . ALLERGIC RHINITIS 09/17/2006  . DIVERTICULOSIS, COLON 09/17/2006    Lewis Moccasin, PT 08/12/2020, 3:41 PM  [place] MAIN [facility] SERVICES [address] [place], [place], [address] Phone: [phone]   Fax:  [phone]  Name: DENNEY SHEIN MRN: [id number] Date of Birth: 10-10-1940

## 2020-08-19 ENCOUNTER — Ambulatory Visit: Payer: Medicare Other

## 2020-08-25 ENCOUNTER — Ambulatory Visit: Payer: Medicare Other

## 2020-08-25 ENCOUNTER — Other Ambulatory Visit: Payer: Self-pay

## 2020-08-25 DIAGNOSIS — R278 Other lack of coordination: Secondary | ICD-10-CM

## 2020-08-25 DIAGNOSIS — M6281 Muscle weakness (generalized): Secondary | ICD-10-CM

## 2020-08-25 DIAGNOSIS — R262 Difficulty in walking, not elsewhere classified: Secondary | ICD-10-CM

## 2020-08-25 DIAGNOSIS — R269 Unspecified abnormalities of gait and mobility: Secondary | ICD-10-CM | POA: Diagnosis not present

## 2020-08-25 DIAGNOSIS — R2681 Unsteadiness on feet: Secondary | ICD-10-CM | POA: Diagnosis not present

## 2020-08-25 DIAGNOSIS — R2689 Other abnormalities of gait and mobility: Secondary | ICD-10-CM | POA: Diagnosis not present

## 2020-08-25 NOTE — Therapy (Addendum)
[place] MAIN [facility] SERVICES [address] [place], [place], [address] Phone: [phone]   Fax:  [phone]  Physical Therapy Treatment  Patient Details  Name: Lucas [name] MRN: [id number] Date of Birth: May 28, 1940 Referring Provider (PT): Dr. Bennie Pierini T. Hyatt   Encounter Date: 08/25/2020   PT End of Session - 08/25/20 1559    Visit Number 3    Number of Visits 25    Date for PT Re-Evaluation 11/01/20    Authorization Time Period 08/09/2020- 11/01/2020    PT Start Time 1600    PT Stop Time 1643    PT Time Calculation (min) 43 min    Equipment Utilized During Treatment Gait belt    Activity Tolerance Patient tolerated treatment well    Behavior During Therapy WFL for tasks assessed/performed           Past Medical History:  Diagnosis Date  . Allergic rhinitis   . Diabetes mellitus type II   . Diverticulosis of colon   . Glaucoma    peripheral vision loss left eye.   Marland Kitchen History of agent Orange exposure   . HLD (hyperlipidemia)   . HTN (hypertension)   . Hypertrophy of prostate without urinary obstruction and other lower urinary tract symptoms (LUTS)   . Osteoarthritis     Past Surgical History:  Procedure Laterality Date  . TOOTH EXTRACTION      There were no vitals filed for this visit.   Subjective Assessment - 08/25/20 1559    Subjective Patient reports had a good vacation- went to grandsons [employer] basketball tournament and did well.    Pertinent History Patient has referrral from Podiatry for balance and gait impairments and past medical history including HTN, Obstructive sleep apnea, Sinusitis, Diverticulosis, Diabetes with neuropathy, Osteoarthritis, DDD (lumbar), Gout, BPH, HLD, Glaucoma.    Limitations Lifting;Standing;Walking    How long can you sit comfortably? No limits    How long can you stand comfortably? 10 min    How long can you walk comfortably? 10 min    Patient Stated Goals I want to be able to walk a mile  and not lose my balance.    Currently in Pain? Yes    Pain Location Back    Pain Orientation Posterior    Pain Descriptors / Indicators Aching    Pain Type Chronic pain    Pain Onset 1 to 4 weeks ago    Pain Frequency Intermittent    Aggravating Factors  Prolonged sitting/standing           Interventions:  Instruction in LE/low back stretching  HS stretch- supine using gait belt to loop around foot - hold 30 sec x 3 sets each LE. Patient reported feeling tight with Left more than right. Lower trunk rotation- hold 30 sec each LE x 3 sets- Patient reports feeling very tight on left side with rotation to right.  Single knee to chest- hold 30 sec each LE x 3 sets Piriformis stretch- hold 30 sec each LE x 3 sets- Patient able to attain position and perform with only verbal cues and visual demonstration.   Reviewed Home program from previous visit:   Standing Hip march- Cues to slow down and hold x 2 sec Stand Hip ext Stand hip abd Stand knee flex Stand Mini squat Stand calf raises Stand toe raises 10 reps each BLE - Education provided throughout session via VC/TC and demonstration to facilitate movement at target joints and correct muscle  activation for all testing and exercises performed. Patient reported being very fatigued upon completion requiring brief rest break.         Access Code: [email or web address] URL: [email or web address] Date: 08/25/2020 Prepared by: Berdie Ogren  Exercises Supine Lower Trunk Rotation - 1 x daily - 7 x weekly - 3 sets - 20 hold Supine Hamstring Stretch with Strap - 1 x daily - 7 x weekly - 3 sets - 30 hold Supine Figure 4 Piriformis Stretch - 1 x daily - 7 x weekly - 3 sets - 30 hold Supine Single Knee to Chest Stretch - 1 x daily - 7 x weekly - 3 sets - 30 hold      Clinical Impression: Patient responded well to flexibility training and able to follow all VC and visual demo for correct technique. Patient reported feeling  better after stretching only required verbal cues fto slow down with current HEP. Pt will benefit from skilled PT services to address deficits in balance and decrease risk for future falls and improve quality of life.                     PT Education - 08/25/20 1647    Education Details low back stretching education and review of HEP-Access Code: [email or web address]  URL: [email or web address]    Person(s) Educated Patient    Methods Explanation;Demonstration;Tactile cues;Verbal cues;Handout    Comprehension Verbalized understanding;Returned demonstration;Verbal cues required;Tactile cues required;Need further instruction            PT Short Term Goals - 08/09/20 2004      PT SHORT TERM GOAL #1   Title Pt will be independent with HEP in order to improve strength and balance in order to decrease fall risk and improve function at home and work.    Baseline 08/09/2020- Patient with no HEP in place    Time 6    Period Weeks    Status New    Target Date 09/20/20             PT Long Term Goals - 08/09/20 2005      PT LONG TERM GOAL #1   Title Pt will improve FOTO to  target score of 63  to display perceived improvements in ability to complete ADL's and improved self- perception of functional capabilities    Baseline 08/09/2020= 60    Time 12    Period Weeks    Status New    Target Date 11/01/20      PT LONG TERM GOAL #2   Title Patient will increase Functional Gait Assessment score to >23/30 as to reduce fall risk and improve dynamic gait safety with community ambulation.    Baseline 08/09/2020- FGA 19/30    Time 12    Period Weeks    Status New    Target Date 11/01/20      PT LONG TERM GOAL #3   Title Pt will decrease 5TSTS by at least 3 seconds in order to demonstrate clinically significant improvement in LE strength.    Baseline 08/09/2020= 23 sec with BUE support (hands on knees)    Time 12    Period Weeks    Status New    Target Date 11/01/20      PT  LONG TERM GOAL #4   Title Pt will decrease TUG to below 14 seconds/decrease in order to demonstrate decreased fall risk.    Baseline 08/09/2020- 16.03 sec without an AD    Time 12  Period Weeks    Status New    Target Date 11/01/20      PT LONG TERM GOAL #5   Title Pt will increase 6MWT by at least 43m (175ft) in order to demonstrate clinically significant improvement in cardiopulmonary endurance and community ambulation    Baseline 08/09/2020- 945 feet - Completed test.    Time 12    Period Weeks    Status New    Target Date 11/01/20                 Plan - 08/25/20 1600    Clinical Impression Statement Patient responded well to flexibility training and able to follow all VC and visual demo for correct technique. Patient reported feeling better after stretching only required verbal cues fto slow down with current HEP. Pt will benefit from skilled PT services to address deficits in balance and decrease risk for future falls and improve quality of life.    Personal Factors and Comorbidities Age;Comorbidity 3+    Comorbidities HTN, DM with neuropathy, Osteoarthritis    Examination-Activity Limitations Carry;Lift;Squat;Stand    Examination-Participation Restrictions Cleaning;Community Activity;Yard Work    Stability/Clinical Decision Making Stable/Uncomplicated    Rehab Potential Good    PT Frequency 2x / week    PT Duration 12 weeks    PT Treatment/Interventions ADLs/Self Care Home Management;Cryotherapy;DME Instruction;Gait training;Stair training;Therapeutic activities;Functional mobility training;Therapeutic exercise;Balance training;Neuromuscular re-education;Patient/family education;Manual techniques;Passive range of motion;Dry needling    PT Next Visit Plan Continue with LE strengthening and balance    PT Home Exercise Plan Access Code: [email or web address]  URL: [email or web address]    Consulted and Agree with Plan of Care Patient           Patient will benefit from  skilled therapeutic intervention in order to improve the following deficits and impairments:  Abnormal gait,Cardiopulmonary status limiting activity,Decreased activity tolerance,Decreased balance,Decreased endurance,Decreased mobility,Decreased strength,Difficulty walking,Impaired sensation,Impaired vision/preception  Visit Diagnosis: Abnormality of gait and mobility  Difficulty in walking, not elsewhere classified  Muscle weakness (generalized)  Other lack of coordination     Problem List Patient Active Problem List   Diagnosis Date Noted  . Anxiety state 07/19/2020  . Benign neoplasm of colon 07/19/2020  . Chronic sinusitis 07/19/2020  . Family history of malignant neoplasm of gastrointestinal tract 07/19/2020  . Glaucoma 07/19/2020  . 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 Psychosexual dysfunction with inhibited sexual excitement 07/19/2020  . Diabetic polyneuropathy associated with type 2 diabetes mellitus ([place]) 10/27/2019  . Sensory ataxia 10/27/2019  . Uncontrolled diabetes mellitus with diabetic neuropathy ([place]) 09/02/2019  . Obesity, Class I, BMI 30-34.9 12/07/2017  . DDD (degenerative disc disease), lumbar 05/16/2016  . Advance directive discussed with patient 07/28/2015  . Gouty arthropathy, chronic, without tophi 07/08/2015  . Hypertension 07/08/2015  . Obstructive sleep apnea 07/22/2014  . Routine general medical examination at a health care facility 06/06/2011  . BPH with obstruction/lower urinary tract symptoms   . Generalized osteoarthritis of multiple sites 09/20/2006  . Hyperlipidemia 09/17/2006  . GLAUCOMA 09/17/2006  . ALLERGIC RHINITIS 09/17/2006  . DIVERTICULOSIS, COLON 09/17/2006    Lewis Moccasin, PT 08/25/2020, 5:01 PM  [place] MAIN [facility] SERVICES [address] [place], [place], [address] Phone: [phone]   Fax:  [phone]  Name: Lucas [name] MRN: [id number] Date  of Birth: 04-10-1941

## 2020-08-30 ENCOUNTER — Ambulatory Visit: Payer: Medicare Other | Admitting: Physical Therapy

## 2020-08-30 ENCOUNTER — Other Ambulatory Visit: Payer: Self-pay

## 2020-08-30 ENCOUNTER — Encounter: Payer: Self-pay | Admitting: Physical Therapy

## 2020-08-30 DIAGNOSIS — R262 Difficulty in walking, not elsewhere classified: Secondary | ICD-10-CM | POA: Diagnosis not present

## 2020-08-30 DIAGNOSIS — R2689 Other abnormalities of gait and mobility: Secondary | ICD-10-CM | POA: Diagnosis not present

## 2020-08-30 DIAGNOSIS — R269 Unspecified abnormalities of gait and mobility: Secondary | ICD-10-CM

## 2020-08-30 DIAGNOSIS — M6281 Muscle weakness (generalized): Secondary | ICD-10-CM

## 2020-08-30 DIAGNOSIS — R2681 Unsteadiness on feet: Secondary | ICD-10-CM | POA: Diagnosis not present

## 2020-08-30 DIAGNOSIS — R278 Other lack of coordination: Secondary | ICD-10-CM

## 2020-08-30 NOTE — Therapy (Signed)
[place] MAIN [facility] SERVICES [address] [place], [place], [address] Phone: [phone]   Fax:  [phone]  Physical Therapy Treatment  Patient Details  Name: Lucas [name] MRN: [id number] Date of Birth: [birth year] Referring Provider (PT): Dr. Bennie Pierini T. Hyatt   Encounter Date: 08/30/2020   PT End of Session - 08/30/20 1156    Visit Number 4    Number of Visits 25    Date for PT Re-Evaluation 11/01/20    Authorization Time Period 08/09/2020- 11/01/2020    PT Start Time 1151    PT Stop Time 1230    PT Time Calculation (min) 39 min    Equipment Utilized During Treatment Gait belt    Activity Tolerance Patient tolerated treatment well    Behavior During Therapy WFL for tasks assessed/performed           Past Medical History:  Diagnosis Date  . Allergic rhinitis   . Diabetes mellitus type II   . Diverticulosis of colon   . Glaucoma    peripheral vision loss left eye.   Marland Kitchen History of agent Orange exposure   . HLD (hyperlipidemia)   . HTN (hypertension)   . Hypertrophy of prostate without urinary obstruction and other lower urinary tract symptoms (LUTS)   . Osteoarthritis     Past Surgical History:  Procedure Laterality Date  . TOOTH EXTRACTION      There were no vitals filed for this visit.   Subjective Assessment - 08/30/20 1155    Subjective Patient reports doing well; No new falls and no soreness;    Pertinent History Patient has referrral from Podiatry for balance and gait impairments and past medical history including HTN, Obstructive sleep apnea, Sinusitis, Diverticulosis, Diabetes with neuropathy, Osteoarthritis, DDD (lumbar), Gout, BPH, HLD, Glaucoma.    Limitations Lifting;Standing;Walking    How long can you sit comfortably? No limits    How long can you stand comfortably? 10 min    How long can you walk comfortably? 10 min    Patient Stated Goals I want to be able to walk a mile and not lose my balance.    Currently  in Pain? No/denies    Pain Onset 1 to 4 weeks ago    Multiple Pain Sites No              TREATMENT: Instructed patient in LE strengthening with 2# ankle weights: Standing next to support: -hip flexion march x15 reps; -hip abduction x15 reps; -hamstring curl x15 reps; Patient required min VCs for proper positioning/exercise technique for optimal strengthening;  Instructed patient in balance exercise:  Patient instructed in advanced balance exercise  Standing beside stairs:   Standing on airex foam: -Feet apart:  Eyes open 30 sec hold unsupported x3 sets  Eyes closed 10 sec hold unsupported x3 sets Patient exhibits increased instability forward especially with eyes closed; He required min A for balance stability especially with eyes closed; -alternate toe taps to 4 inch step with 2-1 rail assist x15 reps bilaterally; -Standing one foot on airex, one foot on 8 inch step,   Unsupported standing 15 sec hold x2 reps  Progressed to lateral head turns side/side x5 reps;  -Heel/toe raises x10 reps with rail assist for balance Patient required min VCs for balance stability, including to increase trunk control for less loss of balance with smaller base of support   Weaving around cones #4 x6 laps with CGA for safety; required min VCs to increase step  length and increase turn for better cone negotiation  Patient tolerated session well. He does require CGA to min A when standing on compliant surfaces. Patient does exhibit decreased ankle strategies with increased instability anteriorly especially with eyes closed, often requiring on hip strategies for balance recovery.    He reports HEP is going well, he is doing it at least 3x a week.                     PT Education - 08/30/20 1155    Education Details LE strengthening, balance; HEP    Person(s) Educated Patient    Methods Explanation;Verbal cues    Comprehension Verbalized understanding;Returned  demonstration;Verbal cues required;Need further instruction            PT Short Term Goals - 08/09/20 2004      PT SHORT TERM GOAL #1   Title Pt will be independent with HEP in order to improve strength and balance in order to decrease fall risk and improve function at home and work.    Baseline 08/09/2020- Patient with no HEP in place    Time 6    Period Weeks    Status New    Target Date 09/20/20             PT Long Term Goals - 08/09/20 2005      PT LONG TERM GOAL #1   Title Pt will improve FOTO to  target score of 63  to display perceived improvements in ability to complete ADL's and improved self- perception of functional capabilities    Baseline 08/09/2020= 60    Time 12    Period Weeks    Status New    Target Date 11/01/20      PT LONG TERM GOAL #2   Title Patient will increase Functional Gait Assessment score to >23/30 as to reduce fall risk and improve dynamic gait safety with community ambulation.    Baseline 08/09/2020- FGA 19/30    Time 12    Period Weeks    Status New    Target Date 11/01/20      PT LONG TERM GOAL #3   Title Pt will decrease 5TSTS by at least 3 seconds in order to demonstrate clinically significant improvement in LE strength.    Baseline 08/09/2020= 23 sec with BUE support (hands on knees)    Time 12    Period Weeks    Status New    Target Date 11/01/20      PT LONG TERM GOAL #4   Title Pt will decrease TUG to below 14 seconds/decrease in order to demonstrate decreased fall risk.    Baseline 08/09/2020- 16.03 sec without an AD    Time 12    Period Weeks    Status New    Target Date 11/01/20      PT LONG TERM GOAL #5   Title Pt will increase 6MWT by at least 82m (118ft) in order to demonstrate clinically significant improvement in cardiopulmonary endurance and community ambulation    Baseline 08/09/2020- 945 feet - Completed test.    Time 12    Period Weeks    Status New    Target Date 11/01/20                 Plan - 08/30/20  1331    Clinical Impression Statement Patient motivated and participated well within session. Instructed patient in advanced LE strengthening with increased resistance/repetition. He does require min VCs  for proper exercise technique. Initiated balance exercise, utilizing compliant surface to challenge stance control. Patient exhibits increased instability anteriorly especially with eyes closed. He does require min A when standing unsupported requiring cues for erect posture and better weight shift for balance recovery. He would benefit from additional skilled PT intervention to improve strength, balance and gait safety;    Personal Factors and Comorbidities Age;Comorbidity 3+    Comorbidities HTN, DM with neuropathy, Osteoarthritis    Examination-Activity Limitations Carry;Lift;Squat;Stand    Examination-Participation Restrictions Cleaning;Community Activity;Yard Work    Stability/Clinical Decision Making Stable/Uncomplicated    Rehab Potential Good    PT Frequency 2x / week    PT Duration 12 weeks    PT Treatment/Interventions ADLs/Self Care Home Management;Cryotherapy;DME Instruction;Gait training;Stair training;Therapeutic activities;Functional mobility training;Therapeutic exercise;Balance training;Neuromuscular re-education;Patient/family education;Manual techniques;Passive range of motion;Dry needling    PT Next Visit Plan Continue with LE strengthening and balance    PT Home Exercise Plan Access Code: [email or web address]  URL: [email or web address]    Consulted and Agree with Plan of Care Patient           Patient will benefit from skilled therapeutic intervention in order to improve the following deficits and impairments:  Abnormal gait,Cardiopulmonary status limiting activity,Decreased activity tolerance,Decreased balance,Decreased endurance,Decreased mobility,Decreased strength,Difficulty walking,Impaired sensation,Impaired vision/preception  Visit Diagnosis: Abnormality of gait  and mobility  Difficulty in walking, not elsewhere classified  Muscle weakness (generalized)  Unsteadiness on feet  Other lack of coordination     Problem List Patient Active Problem List   Diagnosis Date Noted  . Anxiety state 07/19/2020  . Benign neoplasm of colon 07/19/2020  . Chronic sinusitis 07/19/2020  . Family history of malignant neoplasm of gastrointestinal tract 07/19/2020  . Glaucoma 07/19/2020  . 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 Psychosexual dysfunction with inhibited sexual excitement 07/19/2020  . Diabetic polyneuropathy associated with type 2 diabetes mellitus ([place]) 10/27/2019  . Sensory ataxia 10/27/2019  . Uncontrolled diabetes mellitus with diabetic neuropathy ([place]) 09/02/2019  . Obesity, Class I, BMI 30-34.9 12/07/2017  . DDD (degenerative disc disease), lumbar 05/16/2016  . Advance directive discussed with patient 07/28/2015  . Gouty arthropathy, chronic, without tophi 07/08/2015  . Hypertension 07/08/2015  . Obstructive sleep apnea 07/22/2014  . Routine general medical examination at a health care facility 06/06/2011  . BPH with obstruction/lower urinary tract symptoms   . Generalized osteoarthritis of multiple sites 09/20/2006  . Hyperlipidemia 09/17/2006  . GLAUCOMA 09/17/2006  . ALLERGIC RHINITIS 09/17/2006  . DIVERTICULOSIS, COLON 09/17/2006    Lannah Koike PT, DPT 08/30/2020, 1:33 PM  [place] MAIN [facility] SERVICES [address] [place], [place], [address] Phone: [phone]   Fax:  [phone]  Name: ALBARO DEVINEY MRN: [id number] Date of Birth: 1941/04/08

## 2020-09-01 ENCOUNTER — Ambulatory Visit: Payer: Medicare Other

## 2020-09-01 ENCOUNTER — Other Ambulatory Visit: Payer: Self-pay

## 2020-09-01 DIAGNOSIS — R262 Difficulty in walking, not elsewhere classified: Secondary | ICD-10-CM | POA: Diagnosis not present

## 2020-09-01 DIAGNOSIS — R2689 Other abnormalities of gait and mobility: Secondary | ICD-10-CM | POA: Diagnosis not present

## 2020-09-01 DIAGNOSIS — R278 Other lack of coordination: Secondary | ICD-10-CM | POA: Diagnosis not present

## 2020-09-01 DIAGNOSIS — R2681 Unsteadiness on feet: Secondary | ICD-10-CM | POA: Diagnosis not present

## 2020-09-01 DIAGNOSIS — M6281 Muscle weakness (generalized): Secondary | ICD-10-CM | POA: Diagnosis not present

## 2020-09-01 DIAGNOSIS — R269 Unspecified abnormalities of gait and mobility: Secondary | ICD-10-CM | POA: Diagnosis not present

## 2020-09-01 NOTE — Therapy (Signed)
[place] MAIN [facility] SERVICES [address] [place], [place], [address] Phone: [phone]   Fax:  [phone]  Physical Therapy Treatment  Patient Details  Name: Lucas [name] MRN: [id number] Date of Birth: July 10, 1940 Referring Provider (PT): Dr. Bennie Pierini T. Hyatt   Encounter Date: 09/01/2020   PT End of Session - 09/01/20 1712    Visit Number 5    Number of Visits 25    Date for PT Re-Evaluation 11/01/20    Authorization Time Period 08/09/2020- 11/01/2020    PT Start Time 1346    PT Stop Time 1430    PT Time Calculation (min) 44 min    Equipment Utilized During Treatment Gait belt    Activity Tolerance Patient tolerated treatment well    Behavior During Therapy WFL for tasks assessed/performed           Past Medical History:  Diagnosis Date  . Allergic rhinitis   . Diabetes mellitus type II   . Diverticulosis of colon   . Glaucoma    peripheral vision loss left eye.   Marland Kitchen History of agent Orange exposure   . HLD (hyperlipidemia)   . HTN (hypertension)   . Hypertrophy of prostate without urinary obstruction and other lower urinary tract symptoms (LUTS)   . Osteoarthritis     Past Surgical History:  Procedure Laterality Date  . TOOTH EXTRACTION      There were no vitals filed for this visit.   Subjective Assessment - 09/01/20 1348    Subjective Pt denies falls-near falls. Pt reports no major changes since last session. Pt has been performing HEP.    Pertinent History Patient has referrral from Podiatry for balance and gait impairments and past medical history including HTN, Obstructive sleep apnea, Sinusitis, Diverticulosis, Diabetes with neuropathy, Osteoarthritis, DDD (lumbar), Gout, BPH, HLD, Glaucoma.    Limitations Lifting;Standing;Walking    How long can you sit comfortably? No limits    How long can you stand comfortably? 10 min    How long can you walk comfortably? 10 min    Patient Stated Goals I want to be able to walk  a mile and not lose my balance.    Currently in Pain? No/denies    Pain Onset 1 to 4 weeks ago           TREATMENT:   LE strengthening with 3# ankle weights: Standing next to support: -hip flexion march 2x15 reps; -hip abduction 2x15 reps; -hamstring curl 2x15-18 reps; -Seated LAQ 2x15 Patient continues to require min VCs and demo for proper positioning/exercise technique for optimal strengthening;  Neuro Re-ed:      Standing beside support bar:    Standing on airex foam: -Feet apart:             Eyes open 30 sec hold unsupported x2 sets             Eyes closed 30 sec holdx2 sets; intermittent UE support Eyes open with NBOS 2x30 sec; intermittent UE support  Ankle rockers - 2x15 forward and backward  Alternate toe taps to 4 inch step with 2x15 bilaterally;  Standing one foot on 6" step - 4x30 sec each LE. More difficult on the LLE compared to R/decreased stability.     PT Education - 09/01/20 1711    Education Details Pt educated on exercise technique, body mechanics    Person(s) Educated Patient    Methods Explanation;Demonstration;Verbal cues    Comprehension Returned demonstration;Verbalized understanding  PT Short Term Goals - 08/09/20 2004      PT SHORT TERM GOAL #1   Title Pt will be independent with HEP in order to improve strength and balance in order to decrease fall risk and improve function at home and work.    Baseline 08/09/2020- Patient with no HEP in place    Time 6    Period Weeks    Status New    Target Date 09/20/20             PT Long Term Goals - 08/09/20 2005      PT LONG TERM GOAL #1   Title Pt will improve FOTO to  target score of 63  to display perceived improvements in ability to complete ADL's and improved self- perception of functional capabilities    Baseline 08/09/2020= 60    Time 12    Period Weeks    Status New    Target Date 11/01/20      PT LONG TERM GOAL #2   Title Patient will increase Functional Gait  Assessment score to >23/30 as to reduce fall risk and improve dynamic gait safety with community ambulation.    Baseline 08/09/2020- FGA 19/30    Time 12    Period Weeks    Status New    Target Date 11/01/20      PT LONG TERM GOAL #3   Title Pt will decrease 5TSTS by at least 3 seconds in order to demonstrate clinically significant improvement in LE strength.    Baseline 08/09/2020= 23 sec with BUE support (hands on knees)    Time 12    Period Weeks    Status New    Target Date 11/01/20      PT LONG TERM GOAL #4   Title Pt will decrease TUG to below 14 seconds/decrease in order to demonstrate decreased fall risk.    Baseline 08/09/2020- 16.03 sec without an AD    Time 12    Period Weeks    Status New    Target Date 11/01/20      PT LONG TERM GOAL #5   Title Pt will increase 6MWT by at least 98m (12ft) in order to demonstrate clinically significant improvement in cardiopulmonary endurance and community ambulation    Baseline 08/09/2020- 945 feet - Completed test.    Time 12    Period Weeks    Status New    Target Date 11/01/20                 Plan - 09/01/20 1713    Clinical Impression Statement Pt continues to be motivated to participate in session. Pt was able to advance LE strengthening today in both volume and resistance used. He continues to be challenged by SLB tasks and balance tasks with EC. Initiated ankle-rocker board to promote awareness of posterior weight-shifts. The pt will benefit from further skilled PT to improve strength and balance to decrease risk of falls.    Personal Factors and Comorbidities Age;Comorbidity 3+    Comorbidities HTN, DM with neuropathy, Osteoarthritis    Examination-Activity Limitations Carry;Lift;Squat;Stand    Examination-Participation Restrictions Cleaning;Community Activity;Yard Work    Stability/Clinical Decision Making Stable/Uncomplicated    Rehab Potential Good    PT Frequency 2x / week    PT Duration 12 weeks    PT  Treatment/Interventions ADLs/Self Care Home Management;Cryotherapy;DME Instruction;Gait training;Stair training;Therapeutic activities;Functional mobility training;Therapeutic exercise;Balance training;Neuromuscular re-education;Patient/family education;Manual techniques;Passive range of motion;Dry needling    PT Next Visit Plan  Continue with LE strengthening and balance    PT Home Exercise Plan Access Code: [email or web address]  URL: [email or web address]    Consulted and Agree with Plan of Care Patient           Patient will benefit from skilled therapeutic intervention in order to improve the following deficits and impairments:  Abnormal gait,Cardiopulmonary status limiting activity,Decreased activity tolerance,Decreased balance,Decreased endurance,Decreased mobility,Decreased strength,Difficulty walking,Impaired sensation,Impaired vision/preception  Visit Diagnosis: Unsteadiness on feet  Other abnormalities of gait and mobility  Muscle weakness (generalized)     Problem List Patient Active Problem List   Diagnosis Date Noted  . Anxiety state 07/19/2020  . Benign neoplasm of colon 07/19/2020  . Chronic sinusitis 07/19/2020  . Family history of malignant neoplasm of gastrointestinal tract 07/19/2020  . Glaucoma 07/19/2020  . 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 Psychosexual dysfunction with inhibited sexual excitement 07/19/2020  . Diabetic polyneuropathy associated with type 2 diabetes mellitus ([place]) 10/27/2019  . Sensory ataxia 10/27/2019  . Uncontrolled diabetes mellitus with diabetic neuropathy ([place]) 09/02/2019  . Obesity, Class I, BMI 30-34.9 12/07/2017  . DDD (degenerative disc disease), lumbar 05/16/2016  . Advance directive discussed with patient 07/28/2015  . Gouty arthropathy, chronic, without tophi 07/08/2015  . Hypertension 07/08/2015  . Obstructive sleep apnea 07/22/2014  . Routine general medical examination at a health care  facility 06/06/2011  . BPH with obstruction/lower urinary tract symptoms   . Generalized osteoarthritis of multiple sites 09/20/2006  . Hyperlipidemia 09/17/2006  . GLAUCOMA 09/17/2006  . ALLERGIC RHINITIS 09/17/2006  . DIVERTICULOSIS, COLON 09/17/2006   Ricard Dillon PT, DPT 09/01/2020, 5:20 PM  [place] MAIN [facility] SERVICES [address] [place], [place], [address] Phone: [phone]   Fax:  [phone]  Name: SUSIE POUSSON MRN: [id number] Date of Birth: 05-23-1940

## 2020-09-03 ENCOUNTER — Ambulatory Visit: Payer: Medicare Other | Admitting: Internal Medicine

## 2020-09-07 ENCOUNTER — Encounter: Payer: Self-pay | Admitting: Physical Therapy

## 2020-09-07 ENCOUNTER — Other Ambulatory Visit: Payer: Self-pay

## 2020-09-07 ENCOUNTER — Ambulatory Visit: Payer: Medicare Other | Admitting: Physical Therapy

## 2020-09-07 DIAGNOSIS — R278 Other lack of coordination: Secondary | ICD-10-CM | POA: Diagnosis not present

## 2020-09-07 DIAGNOSIS — M6281 Muscle weakness (generalized): Secondary | ICD-10-CM | POA: Diagnosis not present

## 2020-09-07 DIAGNOSIS — R2681 Unsteadiness on feet: Secondary | ICD-10-CM | POA: Diagnosis not present

## 2020-09-07 DIAGNOSIS — R262 Difficulty in walking, not elsewhere classified: Secondary | ICD-10-CM | POA: Diagnosis not present

## 2020-09-07 DIAGNOSIS — R2689 Other abnormalities of gait and mobility: Secondary | ICD-10-CM | POA: Diagnosis not present

## 2020-09-07 DIAGNOSIS — R269 Unspecified abnormalities of gait and mobility: Secondary | ICD-10-CM | POA: Diagnosis not present

## 2020-09-07 NOTE — Therapy (Signed)
[place] MAIN [facility] SERVICES [address] [place], [place], [address] Phone: [phone]   Fax:  [phone]  Physical Therapy Treatment  Patient Details  Name: Lucas [name] MRN: [id number] Date of Birth: 15-Jan-1941 Referring Provider (PT): Dr. Bennie Pierini T. Hyatt   Encounter Date: 09/07/2020   PT End of Session - 09/07/20 1351    Visit Number 6    Number of Visits 25    Date for PT Re-Evaluation 11/01/20    Authorization Time Period 08/09/2020- 11/01/2020    PT Start Time 1346    PT Stop Time 1430    PT Time Calculation (min) 44 min    Equipment Utilized During Treatment Gait belt    Activity Tolerance Patient tolerated treatment well    Behavior During Therapy WFL for tasks assessed/performed           Past Medical History:  Diagnosis Date  . Allergic rhinitis   . Diabetes mellitus type II   . Diverticulosis of colon   . Glaucoma    peripheral vision loss left eye.   Marland Kitchen History of agent Orange exposure   . HLD (hyperlipidemia)   . HTN (hypertension)   . Hypertrophy of prostate without urinary obstruction and other lower urinary tract symptoms (LUTS)   . Osteoarthritis     Past Surgical History:  Procedure Laterality Date  . TOOTH EXTRACTION      There were no vitals filed for this visit.   Subjective Assessment - 09/07/20 1350    Subjective Pt denies falls-near falls. Pt reports no new changes. Denies any soreness or pain; Reports adherence with HEP    Pertinent History Patient has referrral from Podiatry for balance and gait impairments and past medical history including HTN, Obstructive sleep apnea, Sinusitis, Diverticulosis, Diabetes with neuropathy, Osteoarthritis, DDD (lumbar), Gout, BPH, HLD, Glaucoma.    Limitations Lifting;Standing;Walking    How long can you sit comfortably? No limits    How long can you stand comfortably? 10 min    How long can you walk comfortably? 10 min    Patient Stated Goals I want to be able  to walk a mile and not lose my balance.    Currently in Pain? No/denies    Pain Onset 1 to 4 weeks ago    Multiple Pain Sites No                TREATMENT:  Warm up on Nustep BUE/BLE level 2 x4 min (unbilled);  Neuro Re-ed:      Standing in parallel bars:    Standing on airex foam: -Feet apart:             Eyes open 30 sec hold unsupported x2 sets             Eyes closed 15 sec holdx2 sets; intermittent UE support Pt exhibits increased unsteadiness with eyes closed with anterior/posterior instability;   On Airex:  Alternate toe taps to 4 inch step with x15 bilaterally without rail assist; Standing one foot on airex, and one on 6" step - x 30sec each LE. More difficult on the LLE compared to R/decreased stability.  Progressed with BUE ball pass side/side x10 reps each foot on step;  -Heel raises unsupported x10 reps slowly with min A for safety;   Standing on  bolster: -feet apart: Heel/toe rock x15 reps with rail assist for safety -progressed to feet in neutral BUE wand flexion x10 reps -tandem stance with 1-0 rail assist  15 sec hold x2 reps each foot in front  Patient required min VCs for balance stability, including to increase trunk control for less loss of balance with smaller base of support    Resisted walking 12.5# forward/backward, side/side x2 laps each (2 way) with min A for safety especially with eccentric return;   Patient tolerated session well. He continues to have some instability especially on compliant surface with decreased ankle strategies especially anterior/posteriorly; Patient would benefit from additional skilled PT intervention to improve strength, balance and mobility;                     PT Education - 09/07/20 1351    Education Details balance/gait safety;    Person(s) Educated Patient    Methods Explanation;Verbal cues    Comprehension Returned demonstration;Verbalized understanding;Verbal cues required;Need further  instruction            PT Short Term Goals - 08/09/20 2004      PT SHORT TERM GOAL #1   Title Pt will be independent with HEP in order to improve strength and balance in order to decrease fall risk and improve function at home and work.    Baseline 08/09/2020- Patient with no HEP in place    Time 6    Period Weeks    Status New    Target Date 09/20/20             PT Long Term Goals - 08/09/20 2005      PT LONG TERM GOAL #1   Title Pt will improve FOTO to  target score of 63  to display perceived improvements in ability to complete ADL's and improved self- perception of functional capabilities    Baseline 08/09/2020= 60    Time 12    Period Weeks    Status New    Target Date 11/01/20      PT LONG TERM GOAL #2   Title Patient will increase Functional Gait Assessment score to >23/30 as to reduce fall risk and improve dynamic gait safety with community ambulation.    Baseline 08/09/2020- FGA 19/30    Time 12    Period Weeks    Status New    Target Date 11/01/20      PT LONG TERM GOAL #3   Title Pt will decrease 5TSTS by at least 3 seconds in order to demonstrate clinically significant improvement in LE strength.    Baseline 08/09/2020= 23 sec with BUE support (hands on knees)    Time 12    Period Weeks    Status New    Target Date 11/01/20      PT LONG TERM GOAL #4   Title Pt will decrease TUG to below 14 seconds/decrease in order to demonstrate decreased fall risk.    Baseline 08/09/2020- 16.03 sec without an AD    Time 12    Period Weeks    Status New    Target Date 11/01/20      PT LONG TERM GOAL #5   Title Pt will increase 6MWT by at least 14m (118ft) in order to demonstrate clinically significant improvement in cardiopulmonary endurance and community ambulation    Baseline 08/09/2020- 945 feet - Completed test.    Time 12    Period Weeks    Status New    Target Date 11/01/20                 Plan - 09/07/20 1434    Clinical Impression  Statement Patient  motivated and participated well within session. He was instructed in advanced balance tasks. Patient does require CGA to min A with advanced tasks especially while on compliant surfaces with reduced rail assist. Patient continues to have decreased anterior stability with instability noted while on 1/2 bolster/rocker surface. He would benefit from additional skilled PT Intervention to improve strength, balance and mobility;    Personal Factors and Comorbidities Age;Comorbidity 3+    Comorbidities HTN, DM with neuropathy, Osteoarthritis    Examination-Activity Limitations Carry;Lift;Squat;Stand    Examination-Participation Restrictions Cleaning;Community Activity;Yard Work    Stability/Clinical Decision Making Stable/Uncomplicated    Rehab Potential Good    PT Frequency 2x / week    PT Duration 12 weeks    PT Treatment/Interventions ADLs/Self Care Home Management;Cryotherapy;DME Instruction;Gait training;Stair training;Therapeutic activities;Functional mobility training;Therapeutic exercise;Balance training;Neuromuscular re-education;Patient/family education;Manual techniques;Passive range of motion;Dry needling    PT Next Visit Plan Continue with LE strengthening and balance    PT Home Exercise Plan Access Code: [email or web address]  URL: [email or web address]    Consulted and Agree with Plan of Care Patient           Patient will benefit from skilled therapeutic intervention in order to improve the following deficits and impairments:  Abnormal gait,Cardiopulmonary status limiting activity,Decreased activity tolerance,Decreased balance,Decreased endurance,Decreased mobility,Decreased strength,Difficulty walking,Impaired sensation,Impaired vision/preception  Visit Diagnosis: Unsteadiness on feet  Other abnormalities of gait and mobility  Muscle weakness (generalized)  Difficulty in walking, not elsewhere classified     Problem List Patient Active Problem List   Diagnosis Date Noted   . Anxiety state 07/19/2020  . Benign neoplasm of colon 07/19/2020  . Chronic sinusitis 07/19/2020  . Family history of malignant neoplasm of gastrointestinal tract 07/19/2020  . Glaucoma 07/19/2020  . 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 Psychosexual dysfunction with inhibited sexual excitement 07/19/2020  . Diabetic polyneuropathy associated with type 2 diabetes mellitus ([place]) 10/27/2019  . Sensory ataxia 10/27/2019  . Uncontrolled diabetes mellitus with diabetic neuropathy ([place]) 09/02/2019  . Obesity, Class I, BMI 30-34.9 12/07/2017  . DDD (degenerative disc disease), lumbar 05/16/2016  . Advance directive discussed with patient 07/28/2015  . Gouty arthropathy, chronic, without tophi 07/08/2015  . Hypertension 07/08/2015  . Obstructive sleep apnea 07/22/2014  . Routine general medical examination at a health care facility 06/06/2011  . BPH with obstruction/lower urinary tract symptoms   . Generalized osteoarthritis of multiple sites 09/20/2006  . Hyperlipidemia 09/17/2006  . GLAUCOMA 09/17/2006  . ALLERGIC RHINITIS 09/17/2006  . DIVERTICULOSIS, COLON 09/17/2006    Rayvon Brandvold PT, DPT 09/07/2020, 2:35 PM  [place] MAIN [facility] SERVICES [address] [place], [place], [address] Phone: [phone]   Fax:  [phone]  Name: Lucas [name] MRN: [id number] Date of Birth: 1941-02-12

## 2020-09-08 ENCOUNTER — Ambulatory Visit (INDEPENDENT_AMBULATORY_CARE_PROVIDER_SITE_OTHER): Payer: Medicare Other | Admitting: Internal Medicine

## 2020-09-08 ENCOUNTER — Encounter: Payer: Self-pay | Admitting: Internal Medicine

## 2020-09-08 VITALS — BP 108/64 | HR 86 | Temp 98.0°F | Ht 70.5 in | Wt 216.0 lb

## 2020-09-08 DIAGNOSIS — Z7189 Other specified counseling: Secondary | ICD-10-CM | POA: Diagnosis not present

## 2020-09-08 DIAGNOSIS — N138 Other obstructive and reflux uropathy: Secondary | ICD-10-CM | POA: Diagnosis not present

## 2020-09-08 DIAGNOSIS — N401 Enlarged prostate with lower urinary tract symptoms: Secondary | ICD-10-CM | POA: Diagnosis not present

## 2020-09-08 DIAGNOSIS — E1142 Type 2 diabetes mellitus with diabetic polyneuropathy: Secondary | ICD-10-CM | POA: Diagnosis not present

## 2020-09-08 DIAGNOSIS — Z0000 Encounter for general adult medical examination without abnormal findings: Secondary | ICD-10-CM

## 2020-09-08 DIAGNOSIS — I10 Essential (primary) hypertension: Secondary | ICD-10-CM | POA: Diagnosis not present

## 2020-09-08 DIAGNOSIS — M1A00X0 Idiopathic chronic gout, unspecified site, without tophus (tophi): Secondary | ICD-10-CM

## 2020-09-08 LAB — HEPATIC FUNCTION PANEL
ALT: 28 U/L (ref 0–53)
AST: 35 U/L (ref 0–37)
Albumin: 4.4 g/dL (ref 3.5–5.2)
Alkaline Phosphatase: 45 U/L (ref 39–117)
Bilirubin, Direct: 0.1 mg/dL (ref 0.0–0.3)
Total Bilirubin: 0.3 mg/dL (ref 0.2–1.2)
Total Protein: 6.9 g/dL (ref 6.0–8.3)

## 2020-09-08 LAB — LIPID PANEL
Cholesterol: 201 mg/dL — ABNORMAL HIGH (ref 0–200)
HDL: 35.2 mg/dL — ABNORMAL LOW (ref >39.00)
Total CHOL/HDL Ratio: 6
Triglycerides: 467 mg/dL — ABNORMAL HIGH (ref 0.0–149.0)

## 2020-09-08 LAB — COMPREHENSIVE METABOLIC PANEL
ALT: 28 U/L (ref 0–53)
AST: 35 U/L (ref 0–37)
Albumin: 4.4 g/dL (ref 3.5–5.2)
Alkaline Phosphatase: 45 U/L (ref 39–117)
BUN: 23 mg/dL (ref 6–23)
CO2: 25 mEq/L (ref 19–32)
Calcium: 9.3 mg/dL (ref 8.4–10.5)
Chloride: 103 mEq/L (ref 96–112)
Creatinine, Ser: 1.22 mg/dL (ref 0.40–1.50)
GFR: 56.72 mL/min — ABNORMAL LOW (ref >60.00)
Glucose, Bld: 136 mg/dL — ABNORMAL HIGH (ref 70–99)
Potassium: 5.3 mEq/L — ABNORMAL HIGH (ref 3.5–5.1)
Sodium: 138 mEq/L (ref 135–145)
Total Bilirubin: 0.3 mg/dL (ref 0.2–1.2)
Total Protein: 6.9 g/dL (ref 6.0–8.3)

## 2020-09-08 LAB — RENAL FUNCTION PANEL
Albumin: 4.4 g/dL (ref 3.5–5.2)
BUN: 23 mg/dL (ref 6–23)
CO2: 25 mEq/L (ref 19–32)
Calcium: 9.3 mg/dL (ref 8.4–10.5)
Chloride: 103 mEq/L (ref 96–112)
Creatinine, Ser: 1.22 mg/dL (ref 0.40–1.50)
GFR: 56.72 mL/min — ABNORMAL LOW (ref >60.00)
Glucose, Bld: 136 mg/dL — ABNORMAL HIGH (ref 70–99)
Phosphorus: 3.6 mg/dL (ref 2.3–4.6)
Potassium: 5.3 mEq/L — ABNORMAL HIGH (ref 3.5–5.1)
Sodium: 138 mEq/L (ref 135–145)

## 2020-09-08 LAB — CBC
HCT: 40.5 % (ref 39.0–52.0)
Hemoglobin: 12.8 g/dL — ABNORMAL LOW (ref 13.0–17.0)
MCHC: 31.7 g/dL (ref 30.0–36.0)
MCV: 83.1 fl (ref 78.0–100.0)
Platelets: 232 10*3/uL (ref 150.0–400.0)
RBC: 4.88 Mil/uL (ref 4.22–5.81)
RDW: 15.5 % (ref 11.5–15.5)
WBC: 5.1 10*3/uL (ref 4.0–10.5)

## 2020-09-08 LAB — HM DIABETES FOOT EXAM

## 2020-09-08 LAB — URIC ACID: Uric Acid, Serum: 4.7 mg/dL (ref 4.0–7.8)

## 2020-09-08 LAB — LDL CHOLESTEROL, DIRECT: Direct LDL: 94 mg/dL

## 2020-09-08 NOTE — Assessment & Plan Note (Signed)
Slow stream etc---doing okay though without meds

## 2020-09-08 NOTE — Assessment & Plan Note (Signed)
BP Readings from Last 3 Encounters:  09/08/20 108/64  08/09/20 106/81  07/05/20 120/78   Good control on lisinopril

## 2020-09-08 NOTE — Assessment & Plan Note (Signed)
Lab Results  Component Value Date   HGBA1C 6.7 (A) 07/05/2020   Control very good now with tresiba, glipizide, metformin and trulicity Gabapentin for neuropathy

## 2020-09-08 NOTE — Progress Notes (Signed)
Hearing Screening   125Hz  250Hz  500Hz  1000Hz  2000Hz  3000Hz  4000Hz  6000Hz  8000Hz   Right ear:           Left ear:           Comments: Has hearing aids. Not wearing them today  Vision Screening Comments: February 2022

## 2020-09-08 NOTE — Progress Notes (Signed)
Subjective:    Patient ID: Lucas [name], male    DOB: 1940-04-23, 80 y.o.   MRN: [id number]  HPI Here for Medicare wellness visit and follow up of chronic health conditions This visit occurred during the SARS-CoV-2 public health emergency.  Safety protocols were in place, including screening questions prior to the visit, additional usage of staff PPE, and extensive cleaning of exam room while observing appropriate contact time as indicated for disinfecting solutions.   Reviewed advanced directives Reviewed other doctors----Dr Gould--ophthal, Dr Jobie Quaker, Dr Hyatt---podiatry, Dr Syed--rheumatology, Dr Edwyna Ready, Ms [facility] NP, Dr Maryellen Pile No hospitalizations or surgery in the past year 2 falls---no injury Goes to gym for Pilates/weights. Starting to walk again. Exercise from therapist Moved to a townhome---less steps to enter (1 level living with basement--has chair lift) No alcohol or tobacco Still with left eye vision --right is okay Hearing aides--only wears them sporadically No depression and some anhedonia Wife does most of the housework--her choice. He does help out Mild memory issues--nothing worrisome  Having trouble with his bowels--some constipation Uses miralax prn--discussed regular use Appetite is down--rare upset stomach only Energy levels aren't great  Has had insulin added some months ago Control is much better now (12 units of tresiba) No low sugar reactions Ongoing neuropathy symptoms--almost no sensation from knees down (but not painful)  Sent for balance training by Dr Hyatt--due to falls Does use walking stick  Gout flares at times--takes extra colchicine prn Rarely uses indocin Still on uloric  No chest pain No SOB No dizziness or syncope No edema  Voids okay Slow stream---may need to go back for incomplete emptying (at night) Nocturia x 1 only though  Current Outpatient Medications on File Prior to Visit   Medication Sig Dispense Refill  . albuterol (VENTOLIN HFA) 108 (90 Base) MCG/ACT inhaler Inhale 2 puffs into the lungs every 4 (four) hours as needed.    . Alpha-Lipoic Acid 600 MG CAPS Take 1 capsule (600 mg total) by mouth in the morning and at bedtime. 60 capsule 3  . aspirin 81 MG tablet Take 81 mg by mouth daily.    . brimonidine (ALPHAGAN) 0.2 % ophthalmic solution Place 1 drop into both eyes 2 (two) times daily.     . cetirizine (ZYRTEC) 10 MG tablet Take 10 mg by mouth daily.    . colchicine 0.6 MG tablet Take 0.6 mg by mouth as needed. Take 2 tablets by mouth at first sign of gout flare followed by 1 tablet after 1 hour. Next day 1 tab;et for 5 days    . COMBIGAN 0.2-0.5 % ophthalmic solution     . fluticasone (FLONASE) 50 MCG/ACT nasal spray     . gabapentin (NEURONTIN) 300 MG capsule TAKE 2 CAPSULES (600 MG TOTAL) BY MOUTH 3 (THREE) TIMES DAILY. 540 capsule 3  . glipiZIDE (GLUCOTROL XL) 5 MG 24 hr tablet TAKE 2 TABLETS BY MOUTH IN THE AM AND 1 TABLET BEFORE DINNER 270 tablet 2  . glucose blood (ONE TOUCH ULTRA TEST) test strip Use as instructed to check sugar 2 times daily 200 each 5  . indomethacin (INDOCIN) 50 MG capsule Take 1 capsule (50 mg total) by mouth 2 (two) times daily with a meal. (Patient taking differently: Take 50 mg by mouth 2 (two) times daily as needed.) 60 capsule 1  . insulin degludec (TRESIBA FLEXTOUCH) 200 UNIT/ML FlexTouch Pen Inject 12 units daily under skin (Patient taking differently: 20 Units. Inject 20 units daily under skin) 9 mL  3  . Insulin Pen Needle 32G X 4 MM MISC Use 1x a day 100 each 3  . latanoprost (XALATAN) 0.005 % ophthalmic solution Place 1 drop into both eyes at bedtime.     Marland Kitchen lisinopril (PRINIVIL,ZESTRIL) 20 MG tablet Take 10 mg by mouth daily.    . metFORMIN (GLUCOPHAGE) 1000 MG tablet Take 1,000 mg by mouth 2 (two) times daily with a meal.    . Misc Natural Products (GLUCOSAMINE CHONDROITIN ADV PO) Take 2 tablets by mouth 2 (two) times  daily.    . Multiple Vitamin (MULTIVITAMIN) tablet Take 1 tablet by mouth daily.    . Omega-3 Fatty Acids (FISH OIL) 1200 MG CAPS     . pravastatin (PRAVACHOL) 20 MG tablet Take 10 mg by mouth daily.     . sildenafil (REVATIO) 20 MG tablet TAKE 3 TO 5 TABLETS BY MOUTH ONCE DAILY AS NEEDED 50 tablet 11  . triamcinolone ointment (KENALOG) 0.5 % Apply 1 application topically 2 (two) times daily. 30 g 0  . TRULICITY 3 QP/6.1PJ SOPN INJECT 3 MG INTO THE SKIN ONCE A WEEK. 6 mL 1  . ULORIC 40 MG tablet Take 40 mg by mouth daily.  4  . Vitamin D, Cholecalciferol, 1000 UNITS CAPS Take 1 capsule by mouth daily.    . mupirocin ointment (BACTROBAN) 2 % Apply to wound twice a day. (Patient not taking: Reported on 09/08/2020) 30 g 2   No current facility-administered medications on file prior to visit.    No Known Allergies  Past Medical History:  Diagnosis Date  . Allergic rhinitis   . Diabetes mellitus type II   . Diverticulosis of colon   . Glaucoma    peripheral vision loss left eye.   Marland Kitchen History of agent Orange exposure   . HLD (hyperlipidemia)   . HTN (hypertension)   . Hypertrophy of prostate without urinary obstruction and other lower urinary tract symptoms (LUTS)   . Osteoarthritis     Past Surgical History:  Procedure Laterality Date  . TOOTH EXTRACTION      Family History  Problem Relation Age of Onset  . Colon cancer Father   . Stroke Mother   . Heart attack Mother   . Coronary artery disease Mother   . Hypertension Other        several siblings  . Diabetes Other        several siblings  . Coronary artery disease Brother   . ALS Sister   . Dementia Sister   . Neuropathy Neg Hx     Social History   Socioeconomic History  . Marital status: Married    Spouse name: Not on file  . Number of children: Not on file  . Years of education: Not on file  . Highest education level: Not on file  Occupational History  . Occupation: Retired-[employer]  Tobacco Use  . Smoking status:  Never Smoker  . Smokeless tobacco: Never Used  Substance and Sexual Activity  . Alcohol use: No    Alcohol/week: 0.0 standard drinks  . Drug use: No  . Sexual activity: Not on file  Other Topics Concern  . Not on file  Social History Narrative   [place] vet- 20% disability from shrapnel wounds 1967 and 20% agent orange   Married (Widowed 1996; remarried 6/03)   Retired [employer]      Has living will   Wife, then daughter Hoyle Barr will be health care POA   Would want attempts at resuscitation  Would not want feeding tube if cognitively unaware   Social Determinants of Health   Financial Resource Strain: Not on file  Food Insecurity: Not on file  Transportation Needs: Not on file  Physical Activity: Not on file  Stress: Not on file  Social Connections: Not on file  Intimate Partner Violence: Not on file   Review of Systems Appetite is off--weight stable though Sleeps okay Overdue for dentist --due to COVID No suspicious skin lesions now Wears seat belt No heartburn or dysphagia No blood in stool No striking joint pains. Some back aching    Objective:   Physical Exam Constitutional:      Appearance: Normal appearance.  HENT:     Mouth/Throat:     Comments: No lesions Eyes:     Conjunctiva/sclera: Conjunctivae normal.     Pupils: Pupils are equal, round, and reactive to light.  Cardiovascular:     Rate and Rhythm: Normal rate and regular rhythm.     Pulses: Normal pulses.     Heart sounds: No murmur heard. No gallop.   Pulmonary:     Effort: Pulmonary effort is normal.     Breath sounds: Normal breath sounds. No wheezing or rales.  Abdominal:     Palpations: Abdomen is soft.     Tenderness: There is no abdominal tenderness.  Musculoskeletal:     Cervical back: Neck supple.     Right lower leg: No edema.     Left lower leg: No edema.  Lymphadenopathy:     Cervical: No cervical adenopathy.  Skin:    Findings: No rash.     Comments: Feet cool--no lesions   Neurological:     Mental Status: He is alert and oriented to person, place, and time.     Comments: President--- "Jonna Munro, Trump, Obama" [phone] D-[id number] Recall 2/3  Almost no sensation in feet  Psychiatric:        Mood and Affect: Mood normal.        Behavior: Behavior normal.            Assessment & Plan:

## 2020-09-08 NOTE — Assessment & Plan Note (Signed)
See social history 

## 2020-09-08 NOTE — Assessment & Plan Note (Signed)
Occasional flares despite uloric and daily colchicine

## 2020-09-08 NOTE — Assessment & Plan Note (Signed)
I have personally reviewed the Medicare Annual Wellness questionnaire and have noted 1. The patient's medical and social history 2. Their use of alcohol, tobacco or illicit drugs 3. Their current medications and supplements 4. The patient's functional ability including ADL's, fall risks, home safety risks and hearing or visual             impairment. 5. Diet and physical activities 6. Evidence for depression or mood disorders  The patients weight, height, BMI and visual acuity have been recorded in the chart I have made referrals, counseling and provided education to the patient based review of the above and I have provided the pt with a written personalized care plan for preventive services.  I have provided you with a copy of your personalized plan for preventive services. Please take the time to review along with your updated medication list.  Done with cancer screening Needs second COVID booster Flu vaccine in the fall Discussed increasing exercise

## 2020-09-13 ENCOUNTER — Ambulatory Visit: Payer: Medicare Other

## 2020-09-15 ENCOUNTER — Ambulatory Visit: Payer: Medicare Other

## 2020-09-20 ENCOUNTER — Ambulatory Visit: Payer: Medicare Other

## 2020-09-22 ENCOUNTER — Other Ambulatory Visit: Payer: Self-pay

## 2020-09-22 ENCOUNTER — Ambulatory Visit: Payer: Medicare Other | Attending: Podiatry

## 2020-09-22 DIAGNOSIS — R262 Difficulty in walking, not elsewhere classified: Secondary | ICD-10-CM | POA: Insufficient documentation

## 2020-09-22 DIAGNOSIS — R2681 Unsteadiness on feet: Secondary | ICD-10-CM | POA: Insufficient documentation

## 2020-09-22 DIAGNOSIS — M6281 Muscle weakness (generalized): Secondary | ICD-10-CM | POA: Diagnosis not present

## 2020-09-22 DIAGNOSIS — R269 Unspecified abnormalities of gait and mobility: Secondary | ICD-10-CM | POA: Diagnosis not present

## 2020-09-22 DIAGNOSIS — R278 Other lack of coordination: Secondary | ICD-10-CM | POA: Insufficient documentation

## 2020-09-22 DIAGNOSIS — R2689 Other abnormalities of gait and mobility: Secondary | ICD-10-CM | POA: Insufficient documentation

## 2020-09-22 NOTE — Therapy (Signed)
[place] MAIN [facility] SERVICES [address] [place], [place], [address] Phone: [phone]   Fax:  [phone]  Physical Therapy Treatment  Patient Details  Name: Lucas [name] MRN: [id number] Date of Birth: [birth year] Referring Provider (PT): Dr. Bennie Pierini T. Hyatt   Encounter Date: 09/22/2020   PT End of Session - 09/22/20 1714     Visit Number 7    Number of Visits 25    Date for PT Re-Evaluation 11/01/20    Authorization Time Period 08/09/2020- 11/01/2020    PT Start Time 1520    PT Stop Time 1600    PT Time Calculation (min) 40 min    Equipment Utilized During Treatment Gait belt    Activity Tolerance Patient tolerated treatment well    Behavior During Therapy WFL for tasks assessed/performed             Past Medical History:  Diagnosis Date   Allergic rhinitis    Diabetes mellitus type II    Diverticulosis of colon    Glaucoma    peripheral vision loss left eye.    History of agent Orange exposure    HLD (hyperlipidemia)    HTN (hypertension)    Hypertrophy of prostate without urinary obstruction and other lower urinary tract symptoms (LUTS)    Osteoarthritis     Past Surgical History:  Procedure Laterality Date   TOOTH EXTRACTION      There were no vitals filed for this visit.   Subjective Assessment - 09/22/20 1713     Subjective Pt reports no pain today and reports no falls or near-falls. No other changes since previous session.    Pertinent History Patient has referrral from Podiatry for balance and gait impairments and past medical history including HTN, Obstructive sleep apnea, Sinusitis, Diverticulosis, Diabetes with neuropathy, Osteoarthritis, DDD (lumbar), Gout, BPH, HLD, Glaucoma.    Limitations Lifting;Standing;Walking    How long can you sit comfortably? No limits    How long can you stand comfortably? 10 min    How long can you walk comfortably? 10 min    Patient Stated Goals I want to be able to walk a  mile and not lose my balance.    Currently in Pain? No/denies    Pain Onset 1 to 4 weeks ago              TREATMENT:     Neuro Re-ed: CGA-min assist provided throughout for all the following exercises       Standing in parallel bars:    Standing on airex : -Feet apart 30 sec, no decrease in postural stability  NBOS:             Eyes open 30 sec hold unsupported x2 sets             Eyes closed 30 sec-1.5 min x 2 with intermittent UE support - pt rates "pretty challenging' Pt continues to exhibit increased unsteadiness with eyes closed with anterior/posterior instability;   Eyes open vertical and horizontal head turns 10x each, frequently reaches out to grab onto bars for support  SLB 4x30 sec each LE with decreasing UE support, very challenging   On Airex:  Alternate toe taps to 4 inch step x15 bilaterally without rail assist; pt uses intermittent UE support  Soccer ball forward/backward/ side-to-side, CW/CC - x multiple reps each LE  Standing one foot on airex, and one on 6" step - x 30sec each LE. More difficult on the  LLE compared to R/decreased stability.             Progressed with BUE balloon pass side/side x120 reps each foot on step. Frequent use of UE support to regain balance. Most challenged with LLE as primary stance leg.  step; -Heel raises x20, x15 slowly with CGA for safety; pt rates "medium," intermittent UE support   Standing on  bolster: -NBOS: Heel/toe rock 2x20 seps with rail assist for safety - rocker board hip strategies (side-to-side) 2x20 with rail assit for safety. No decrease in postural stability  -tandem stance with 1-0 rail assist 30 sec hold x2 reps each foot in front  - tandem and semi tandem stance with horizontal head turns - 2x30 sec each LE for each, intermittent UE support.  -SLB 30 sec each LE, greater difficulty with LLE   Education provided in the form of VC/TC/demo in order to facilitate movement at target joints and correct  muscle activation with exercises. The pt demonstrates good carryover within session following cuing.      PT Education - 09/22/20 1713     Education Details exercise technique, body mechanics    Person(s) Educated Patient    Methods Explanation;Demonstration;Verbal cues    Comprehension Returned demonstration;Verbalized understanding              PT Short Term Goals - 08/09/20 2004       PT SHORT TERM GOAL #1   Title Pt will be independent with HEP in order to improve strength and balance in order to decrease fall risk and improve function at home and work.    Baseline 08/09/2020- Patient with no HEP in place    Time 6    Period Weeks    Status New    Target Date 09/20/20               PT Long Term Goals - 08/09/20 2005       PT LONG TERM GOAL #1   Title Pt will improve FOTO to  target score of 63  to display perceived improvements in ability to complete ADL's and improved self- perception of functional capabilities    Baseline 08/09/2020= 60    Time 12    Period Weeks    Status New    Target Date 11/01/20      PT LONG TERM GOAL #2   Title Patient will increase Functional Gait Assessment score to >23/30 as to reduce fall risk and improve dynamic gait safety with community ambulation.    Baseline 08/09/2020- FGA 19/30    Time 12    Period Weeks    Status New    Target Date 11/01/20      PT LONG TERM GOAL #3   Title Pt will decrease 5TSTS by at least 3 seconds in order to demonstrate clinically significant improvement in LE strength.    Baseline 08/09/2020= 23 sec with BUE support (hands on knees)    Time 12    Period Weeks    Status New    Target Date 11/01/20      PT LONG TERM GOAL #4   Title Pt will decrease TUG to below 14 seconds/decrease in order to demonstrate decreased fall risk.    Baseline 08/09/2020- 16.03 sec without an AD    Time 12    Period Weeks    Status New    Target Date 11/01/20      PT LONG TERM GOAL #5   Title Pt will increase 6MWT  by  at least 56m (126ft) in order to demonstrate clinically significant improvement in cardiopulmonary endurance and community ambulation    Baseline 08/09/2020- 945 feet - Completed test.    Time 12    Period Weeks    Status New    Target Date 11/01/20                   Plan - 09/22/20 1714     Clinical Impression Statement Pt continues to be highly motivated within PT session. Author continued POC as previously laid out in prior sessions. Pt able to progress NBOS airex exercise to involving vertical and horizontal head turns. He requires at least intermittent UE support for majority of exercises, and CGA-min assist from PT to maintain balance. The pt is most challenged with SLB tasks, including SLB progression exercises. The pt will benefit from further skilled PT to improve mobility, strength and balance to decrease fall risk and increase safety with all functional mobility.    Personal Factors and Comorbidities Age;Comorbidity 3+    Comorbidities HTN, DM with neuropathy, Osteoarthritis    Examination-Activity Limitations Carry;Lift;Squat;Stand    Examination-Participation Restrictions Cleaning;Community Activity;Yard Work    Stability/Clinical Decision Making Stable/Uncomplicated    Rehab Potential Good    PT Frequency 2x / week    PT Duration 12 weeks    PT Treatment/Interventions ADLs/Self Care Home Management;Cryotherapy;DME Instruction;Gait training;Stair training;Therapeutic activities;Functional mobility training;Therapeutic exercise;Balance training;Neuromuscular re-education;Patient/family education;Manual techniques;Passive range of motion;Dry needling    PT Next Visit Plan Continue with LE strengthening and balance    PT Home Exercise Plan Access Code: [email or web address]  URL: [email or web address]    Consulted and Agree with Plan of Care Patient             Patient will benefit from skilled therapeutic intervention in order to improve the following deficits and  impairments:  Abnormal gait, Cardiopulmonary status limiting activity, Decreased activity tolerance, Decreased balance, Decreased endurance, Decreased mobility, Decreased strength, Difficulty walking, Impaired sensation, Impaired vision/preception  Visit Diagnosis: Unsteadiness on feet  Other abnormalities of gait and mobility  Muscle weakness (generalized)     Problem List Patient Active Problem List   Diagnosis Date Noted   Benign neoplasm of colon 07/19/2020   Chronic sinusitis 07/19/2020   Glaucoma 07/19/2020   Diabetic polyneuropathy associated with type 2 diabetes mellitus ([place]) 10/27/2019   Sensory ataxia 10/27/2019   Uncontrolled diabetes mellitus with diabetic neuropathy ([place]) 09/02/2019   Obesity, Class I, BMI 30-34.9 12/07/2017   DDD (degenerative disc disease), lumbar 05/16/2016   Advance directive discussed with patient 07/28/2015   Gouty arthropathy, chronic, without tophi 07/08/2015   Hypertension 07/08/2015   Obstructive sleep apnea 07/22/2014   Routine general medical examination at a health care facility 06/06/2011   BPH with obstruction/lower urinary tract symptoms    Generalized osteoarthritis of multiple sites 09/20/2006   Hyperlipidemia 09/17/2006   ALLERGIC RHINITIS 09/17/2006   DIVERTICULOSIS, COLON 09/17/2006   Ricard Dillon PT, DPT  09/22/2020, 5:25 PM  [place] [facility] MAIN [facility] SERVICES [address] [place], [place], [address] Phone: [phone]   Fax:  [phone]  Name: ARIUS HARNOIS MRN: [id number] Date of Birth: 1940-05-16

## 2020-09-29 ENCOUNTER — Other Ambulatory Visit: Payer: Self-pay

## 2020-09-29 ENCOUNTER — Ambulatory Visit: Payer: Medicare Other

## 2020-09-29 DIAGNOSIS — R262 Difficulty in walking, not elsewhere classified: Secondary | ICD-10-CM

## 2020-09-29 DIAGNOSIS — M6281 Muscle weakness (generalized): Secondary | ICD-10-CM | POA: Diagnosis not present

## 2020-09-29 DIAGNOSIS — R2681 Unsteadiness on feet: Secondary | ICD-10-CM | POA: Diagnosis not present

## 2020-09-29 DIAGNOSIS — R2689 Other abnormalities of gait and mobility: Secondary | ICD-10-CM | POA: Diagnosis not present

## 2020-09-29 DIAGNOSIS — R278 Other lack of coordination: Secondary | ICD-10-CM

## 2020-09-29 DIAGNOSIS — R269 Unspecified abnormalities of gait and mobility: Secondary | ICD-10-CM

## 2020-09-29 NOTE — Therapy (Signed)
[place] MAIN [facility] SERVICES [address] [place], [place], [address] Phone: [phone]   Fax:  [phone]  Physical Therapy Treatment  Patient Details  Name: Lucas [name] MRN: [id number] Date of Birth: 10-Jan-1941 Referring Provider (PT): Dr. Bennie Pierini T. Hyatt   Encounter Date: 09/29/2020   PT End of Session - 09/29/20 1607     Visit Number 8    Number of Visits 25    Date for PT Re-Evaluation 11/01/20    Authorization Time Period 08/09/2020- 11/01/2020    PT Start Time 1603    PT Stop Time 1644    PT Time Calculation (min) 41 min    Equipment Utilized During Treatment Gait belt    Activity Tolerance Patient tolerated treatment well    Behavior During Therapy WFL for tasks assessed/performed             Past Medical History:  Diagnosis Date   Allergic rhinitis    Diabetes mellitus type II    Diverticulosis of colon    Glaucoma    peripheral vision loss left eye.    History of agent Orange exposure    HLD (hyperlipidemia)    HTN (hypertension)    Hypertrophy of prostate without urinary obstruction and other lower urinary tract symptoms (LUTS)    Osteoarthritis     Past Surgical History:  Procedure Laterality Date   TOOTH EXTRACTION      There were no vitals filed for this visit.   Subjective Assessment - 09/29/20 1606     Subjective Patient reports feeling about the same    Pertinent History Patient has referrral from Podiatry for balance and gait impairments and past medical history including HTN, Obstructive sleep apnea, Sinusitis, Diverticulosis, Diabetes with neuropathy, Osteoarthritis, DDD (lumbar), Gout, BPH, HLD, Glaucoma.    Limitations Lifting;Standing;Walking    How long can you sit comfortably? No limits    How long can you stand comfortably? 10 min    How long can you walk comfortably? 10 min    Patient Stated Goals I want to be able to walk a mile and not lose my balance.    Currently in Pain? No/denies     Pain Onset 1 to 4 weeks ago             Interventions:   Dynamic marching in place on purple pad x 30 sec with eyes open and no UE support. ( Mild unsteadiness yet no Loss of balance)   Static stand with feet apart  on purple pad- EO - hold 20 sec (patient steady with no unsteadiness/sway) Static stand with feet together - EC- hold 20 sec x 2 trials (increased A/P sway more unsteadiness reaching for bar)   Dynamic trunk rotation - passing ball from left side to right with PT positioned behind patient while standing on purple pad- Patient able to perform without reaching or loss of balance.    Tandem stance on purple pad- Patient performed 5 trials - holding position with varying ability- hold for 5-20 sec but unsteady and reaching for bar intermittently.   SLS - on firm surface- patient with difficulty attaining and then maintaining position - only able to hold 2-8 sec either leg x multiple attempts. (Reinforced performing this and tandem standing at home and patient verbalized understanding).   Dynamic marching onto 1st step without UE support x 12 reps - No loss of balance or reaching for furniture.   Dynamic stepping up/over 1/2 foam roll (left  to right) x 15 reps each. Patient performed well without loss of balance or reaching for bar.   Dynamic standing on 1/2 foam roll- attempting to static stand without significant weight shift. Patient utilized mostly ankle and some hip strategy- improved with practice - able to hold at best 20 sec x 4 trials.   Clinical Impression: Patient performed well overall with all balance activities and able to progress to more dynamic activities today with good ability to complete all tasks. He did struggle more with SLS or standing with eyes closed and emphasized importance of performing home exercise program for max benefit. Patient verbalized understanding. The pt will benefit from further skilled PT to improve mobility, strength and balance to  decrease fall risk and increase safety with all functional mobility.                          PT Education - 09/29/20 1607     Education Details exercise form    Person(s) Educated Patient    Methods Explanation;Demonstration;Tactile cues;Verbal cues    Comprehension Verbalized understanding;Returned demonstration;Verbal cues required;Tactile cues required;Need further instruction              PT Short Term Goals - 08/09/20 2004       PT SHORT TERM GOAL #1   Title Pt will be independent with HEP in order to improve strength and balance in order to decrease fall risk and improve function at home and work.    Baseline 08/09/2020- Patient with no HEP in place    Time 6    Period Weeks    Status New    Target Date 09/20/20               PT Long Term Goals - 08/09/20 2005       PT LONG TERM GOAL #1   Title Pt will improve FOTO to  target score of 63  to display perceived improvements in ability to complete ADL's and improved self- perception of functional capabilities    Baseline 08/09/2020= 60    Time 12    Period Weeks    Status New    Target Date 11/01/20      PT LONG TERM GOAL #2   Title Patient will increase Functional Gait Assessment score to >23/30 as to reduce fall risk and improve dynamic gait safety with community ambulation.    Baseline 08/09/2020- FGA 19/30    Time 12    Period Weeks    Status New    Target Date 11/01/20      PT LONG TERM GOAL #3   Title Pt will decrease 5TSTS by at least 3 seconds in order to demonstrate clinically significant improvement in LE strength.    Baseline 08/09/2020= 23 sec with BUE support (hands on knees)    Time 12    Period Weeks    Status New    Target Date 11/01/20      PT LONG TERM GOAL #4   Title Pt will decrease TUG to below 14 seconds/decrease in order to demonstrate decreased fall risk.    Baseline 08/09/2020- 16.03 sec without an AD    Time 12    Period Weeks    Status New    Target Date  11/01/20      PT LONG TERM GOAL #5   Title Pt will increase 6MWT by at least 13m (172ft) in order to demonstrate clinically significant improvement in cardiopulmonary endurance  and community ambulation    Baseline 08/09/2020- 945 feet - Completed test.    Time 12    Period Weeks    Status New    Target Date 11/01/20                   Plan - 09/29/20 8502     Clinical Impression Statement Patient performed well overall with all balance activities and able to progress to more dynamic activities today with good ability to complete all tasks. He did struggle more with SLS or standing with eyes closed and emphasized importance of performing home exercise program for max benefit. Patient verbalized understanding. The pt will benefit from further skilled PT to improve mobility, strength and balance to decrease fall risk and increase safety with all functional mobility.    Personal Factors and Comorbidities Age;Comorbidity 3+    Comorbidities HTN, DM with neuropathy, Osteoarthritis    Examination-Activity Limitations Carry;Lift;Squat;Stand    Examination-Participation Restrictions Cleaning;Community Activity;Yard Work    Stability/Clinical Decision Making Stable/Uncomplicated    Rehab Potential Good    PT Frequency 2x / week    PT Duration 12 weeks    PT Treatment/Interventions ADLs/Self Care Home Management;Cryotherapy;DME Instruction;Gait training;Stair training;Therapeutic activities;Functional mobility training;Therapeutic exercise;Balance training;Neuromuscular re-education;Patient/family education;Manual techniques;Passive range of motion;Dry needling    PT Next Visit Plan Continue with LE strengthening and balance    Consulted and Agree with Plan of Care Patient             Patient will benefit from skilled therapeutic intervention in order to improve the following deficits and impairments:  Abnormal gait, Cardiopulmonary status limiting activity, Decreased activity tolerance,  Decreased balance, Decreased endurance, Decreased mobility, Decreased strength, Difficulty walking, Impaired sensation, Impaired vision/preception  Visit Diagnosis: Abnormality of gait and mobility  Difficulty in walking, not elsewhere classified  Muscle weakness (generalized)  Other lack of coordination     Problem List Patient Active Problem List   Diagnosis Date Noted   Benign neoplasm of colon 07/19/2020   Chronic sinusitis 07/19/2020   Glaucoma 07/19/2020   Diabetic polyneuropathy associated with type 2 diabetes mellitus ([place]) 10/27/2019   Sensory ataxia 10/27/2019   Uncontrolled diabetes mellitus with diabetic neuropathy ([place]) 09/02/2019   Obesity, Class I, BMI 30-34.9 12/07/2017   DDD (degenerative disc disease), lumbar 05/16/2016   Advance directive discussed with patient 07/28/2015   Gouty arthropathy, chronic, without tophi 07/08/2015   Hypertension 07/08/2015   Obstructive sleep apnea 07/22/2014   Routine general medical examination at a health care facility 06/06/2011   BPH with obstruction/lower urinary tract symptoms    Generalized osteoarthritis of multiple sites 09/20/2006   Hyperlipidemia 09/17/2006   ALLERGIC RHINITIS 09/17/2006   DIVERTICULOSIS, COLON 09/17/2006    Lewis Moccasin, PT 09/30/2020, 8:40 AM  [place] [facility] MAIN [facility] SERVICES [address] [place], [place], [address] Phone: [phone]   Fax:  [phone]  Name: Lucas [name] MRN: [id number] Date of Birth: 1940-08-05

## 2020-10-04 ENCOUNTER — Ambulatory Visit: Payer: Medicare Other

## 2020-10-04 ENCOUNTER — Other Ambulatory Visit: Payer: Self-pay

## 2020-10-04 DIAGNOSIS — M6281 Muscle weakness (generalized): Secondary | ICD-10-CM | POA: Diagnosis not present

## 2020-10-04 DIAGNOSIS — R269 Unspecified abnormalities of gait and mobility: Secondary | ICD-10-CM | POA: Diagnosis not present

## 2020-10-04 DIAGNOSIS — R2681 Unsteadiness on feet: Secondary | ICD-10-CM | POA: Diagnosis not present

## 2020-10-04 DIAGNOSIS — R278 Other lack of coordination: Secondary | ICD-10-CM

## 2020-10-04 DIAGNOSIS — R262 Difficulty in walking, not elsewhere classified: Secondary | ICD-10-CM

## 2020-10-04 DIAGNOSIS — R2689 Other abnormalities of gait and mobility: Secondary | ICD-10-CM | POA: Diagnosis not present

## 2020-10-04 NOTE — Therapy (Signed)
[place] MAIN [facility] SERVICES [address] [place], [place], [address] Phone: [phone]   Fax:  [phone]  Physical Therapy Treatment  Patient Details  Name: Lucas [name] MRN: [id number] Date of Birth: [birth year] Referring Provider (Lucas [name]): Dr. Bennie Pierini T. Hyatt   Encounter Date: 10/04/2020   Lucas [name] End of Session - 10/04/20 1607     Visit Number 9    Number of Visits 25    Date for Lucas [name] Re-Evaluation 11/01/20    Authorization Time Period 08/09/2020- 11/01/2020    Lucas [name] Start Time 1602    Lucas [name] Stop Time 1642    Lucas [name] Time Calculation (min) 40 min    Equipment Utilized During Treatment Gait belt    Activity Tolerance Patient tolerated treatment well    Behavior During Therapy WFL for tasks assessed/performed             Past Medical History:  Diagnosis Date   Allergic rhinitis    Diabetes mellitus type II    Diverticulosis of colon    Glaucoma    peripheral vision loss left eye.    History of agent Orange exposure    HLD (hyperlipidemia)    HTN (hypertension)    Hypertrophy of prostate without urinary obstruction and other lower urinary tract symptoms (LUTS)    Osteoarthritis     Past Surgical History:  Procedure Laterality Date   TOOTH EXTRACTION      There were no vitals filed for this visit.   Subjective Assessment - 10/04/20 1604     Subjective Patient reports no new issues- denies any pain.    Pertinent History Patient has referrral from Podiatry for balance and gait impairments and past medical history including HTN, Obstructive sleep apnea, Sinusitis, Diverticulosis, Diabetes with neuropathy, Osteoarthritis, DDD (lumbar), Gout, BPH, HLD, Glaucoma.    Limitations Lifting;Standing;Walking    How long can you sit comfortably? No limits    How long can you stand comfortably? 10 min    How long can you walk comfortably? 10 min    Patient Stated Goals I want to be able to walk a mile and not lose my balance.    Currently in Pain?  No/denies    Pain Onset 1 to 4 weeks ago             Interventions:    LE strengthening- Sit to stand  without UE support x 10 reps (mild difficulty - improved with practice and no loss of balance)   Step up alternating BLE x 10 reps   On steps- start with 1 foot on 1st step and then perform hip march with opp LE with GTB up to 3rd step x 12 reps each.   Calf raises on edge of 1st step x 12 reps BLE.   Mini lunge x 10 reps BLE - Patient reports as "Hard"  Hip abd up and over orange hurdle x 20 reps each leg.   Leg press at 40 lb BLE x 12 reps x 2 sets  Education provided throughout session via VC/TC and demonstration to facilitate movement at target joints and correct muscle activation for all testing and exercises performed.   Patient instructed to perform above exercises (excluding leg press and hip march with GTB) at home for up to 3 sets of 10 reps every other day.    Clinical impression: Patient challenged with LE strengthening exercises exhibiting fatigue as limiting factor. Patient able to participate well today - able to complete all tasks  without report of pain or loss of balance.The Lucas [name] will benefit from further skilled Lucas [name] to improve mobility, strength and balance to decrease fall risk and increase safety with all functional mobility.                      Lucas [name] Education - 10/04/20 1606     Education Details exercise techniques    Person(s) Educated Patient    Methods Explanation;Demonstration;Tactile cues;Verbal cues    Comprehension Verbalized understanding;Returned demonstration;Verbal cues required;Tactile cues required;Need further instruction              Lucas [name] Short Term Goals - 08/09/20 2004       Lucas [name] SHORT TERM GOAL #1   Title Lucas [name] will be independent with HEP in order to improve strength and balance in order to decrease fall risk and improve function at home and work.    Baseline 08/09/2020- Patient with no HEP in place    Time 6     Period Weeks    Status New    Target Date 09/20/20               Lucas [name] Long Term Goals - 08/09/20 2005       Lucas [name] LONG TERM GOAL #1   Title Lucas [name] will improve FOTO to  target score of 63  to display perceived improvements in ability to complete ADL's and improved self- perception of functional capabilities    Baseline 08/09/2020= 60    Time 12    Period Weeks    Status New    Target Date 11/01/20      Lucas [name] LONG TERM GOAL #2   Title Patient will increase Functional Gait Assessment score to >23/30 as to reduce fall risk and improve dynamic gait safety with community ambulation.    Baseline 08/09/2020- FGA 19/30    Time 12    Period Weeks    Status New    Target Date 11/01/20      Lucas [name] LONG TERM GOAL #3   Title Lucas [name] will decrease 5TSTS by at least 3 seconds in order to demonstrate clinically significant improvement in LE strength.    Baseline 08/09/2020= 23 sec with BUE support (hands on knees)    Time 12    Period Weeks    Status New    Target Date 11/01/20      Lucas [name] LONG TERM GOAL #4   Title Lucas [name] will decrease TUG to below 14 seconds/decrease in order to demonstrate decreased fall risk.    Baseline 08/09/2020- 16.03 sec without an AD    Time 12    Period Weeks    Status New    Target Date 11/01/20      Lucas [name] LONG TERM GOAL #5   Title Lucas [name] will increase 6MWT by at least 27m (130ft) in order to demonstrate clinically significant improvement in cardiopulmonary endurance and community ambulation    Baseline 08/09/2020- 945 feet - Completed test.    Time 12    Period Weeks    Status New    Target Date 11/01/20                   Plan - 10/04/20 1607     Clinical Impression Statement Patient challenged with LE strengthening exercises exhibiting fatigue as limiting factor. Patient able to participate well today - able to complete all tasks without report of pain or loss of balance.The Lucas [name] will benefit from further skilled Lucas [name] to improve mobility, strength and  balance to decrease fall risk and  increase safety with all functional mobility.    Personal Factors and Comorbidities Age;Comorbidity 3+    Comorbidities HTN, DM with neuropathy, Osteoarthritis    Examination-Activity Limitations Carry;Lift;Squat;Stand    Examination-Participation Restrictions Cleaning;Community Activity;Yard Work    Stability/Clinical Decision Making Stable/Uncomplicated    Rehab Potential Good    Lucas [name] Frequency 2x / week    Lucas [name] Duration 12 weeks    Lucas [name] Treatment/Interventions ADLs/Self Care Home Management;Cryotherapy;DME Instruction;Gait training;Stair training;Therapeutic activities;Functional mobility training;Therapeutic exercise;Balance training;Neuromuscular re-education;Patient/family education;Manual techniques;Passive range of motion;Dry needling    Lucas [name] Next Visit Plan Continue with LE strengthening and balance    Lucas [name] Home Exercise Plan Reviewed LE strengthening- Sit to stand, step ups, Hip abd, calf raises    Consulted and Agree with Plan of Care Patient             Patient will benefit from skilled therapeutic intervention in order to improve the following deficits and impairments:  Abnormal gait, Cardiopulmonary status limiting activity, Decreased activity tolerance, Decreased balance, Decreased endurance, Decreased mobility, Decreased strength, Difficulty walking, Impaired sensation, Impaired vision/preception  Visit Diagnosis: Abnormality of gait and mobility  Difficulty in walking, not elsewhere classified  Muscle weakness (generalized)  Other lack of coordination     Problem List Patient Active Problem List   Diagnosis Date Noted   Benign neoplasm of colon 07/19/2020   Chronic sinusitis 07/19/2020   Glaucoma 07/19/2020   Diabetic polyneuropathy associated with type 2 diabetes mellitus ([place]) 10/27/2019   Sensory ataxia 10/27/2019   Uncontrolled diabetes mellitus with diabetic neuropathy ([place]) 09/02/2019   Obesity, Class I, BMI 30-34.9 12/07/2017   DDD (degenerative disc  disease), lumbar 05/16/2016   Advance directive discussed with patient 07/28/2015   Gouty arthropathy, chronic, without tophi 07/08/2015   Hypertension 07/08/2015   Obstructive sleep apnea 07/22/2014   Routine general medical examination at a health care facility 06/06/2011   BPH with obstruction/lower urinary tract symptoms    Generalized osteoarthritis of multiple sites 09/20/2006   Hyperlipidemia 09/17/2006   ALLERGIC RHINITIS 09/17/2006   DIVERTICULOSIS, COLON 09/17/2006    Lucas [name], Lucas [name] 10/04/2020, 4:48 PM  [place] [facility] MAIN [facility] SERVICES [address] [place], [place], [address] Phone: [phone]   Fax:  [phone]  Name: Lucas [name] MRN: [id number] Date of Birth: 1940-07-09

## 2020-10-06 ENCOUNTER — Other Ambulatory Visit: Payer: Self-pay

## 2020-10-06 ENCOUNTER — Ambulatory Visit: Payer: Medicare Other

## 2020-10-06 DIAGNOSIS — R269 Unspecified abnormalities of gait and mobility: Secondary | ICD-10-CM | POA: Diagnosis not present

## 2020-10-06 DIAGNOSIS — R262 Difficulty in walking, not elsewhere classified: Secondary | ICD-10-CM

## 2020-10-06 DIAGNOSIS — R2681 Unsteadiness on feet: Secondary | ICD-10-CM | POA: Diagnosis not present

## 2020-10-06 DIAGNOSIS — R2689 Other abnormalities of gait and mobility: Secondary | ICD-10-CM | POA: Diagnosis not present

## 2020-10-06 DIAGNOSIS — M6281 Muscle weakness (generalized): Secondary | ICD-10-CM

## 2020-10-06 DIAGNOSIS — R278 Other lack of coordination: Secondary | ICD-10-CM | POA: Diagnosis not present

## 2020-10-06 NOTE — Therapy (Signed)
[place] MAIN [facility] SERVICES [address] [place], [place], [address] Phone: [phone]   Fax:  [phone]  Physical Therapy Treatment/Physical Therapy Progress Note   Dates of reporting period  08/09/2020 to  10/06/2020  Patient Details  Name: Lucas [name] MRN: [id number] Date of Birth: 1940/09/05 Referring Provider (PT): Dr. Bennie Pierini T. Hyatt   Encounter Date: 10/06/2020   PT End of Session - 10/06/20 1607     Visit Number 10    Number of Visits 25    Date for PT Re-Evaluation 11/01/20    Authorization Time Period 08/09/2020- 11/01/2020    PT Start Time 1600    PT Stop Time 1642    PT Time Calculation (min) 42 min    Equipment Utilized During Treatment Gait belt    Activity Tolerance Patient tolerated treatment well    Behavior During Therapy WFL for tasks assessed/performed             Past Medical History:  Diagnosis Date   Allergic rhinitis    Diabetes mellitus type II    Diverticulosis of colon    Glaucoma    peripheral vision loss left eye.    History of agent Orange exposure    HLD (hyperlipidemia)    HTN (hypertension)    Hypertrophy of prostate without urinary obstruction and other lower urinary tract symptoms (LUTS)    Osteoarthritis     Past Surgical History:  Procedure Laterality Date   TOOTH EXTRACTION      There were no vitals filed for this visit.   Subjective Assessment - 10/06/20 1655     Subjective Patient reports doing well and states he has been more compliant with his HEP.    Pertinent History Patient has referrral from Podiatry for balance and gait impairments and past medical history including HTN, Obstructive sleep apnea, Sinusitis, Diverticulosis, Diabetes with neuropathy, Osteoarthritis, DDD (lumbar), Gout, BPH, HLD, Glaucoma.    Limitations Lifting;Standing;Walking    How long can you sit comfortably? No limits    How long can you stand comfortably? 10 min    How long can you walk  comfortably? 10 min    Patient Stated Goals I want to be able to walk a mile and not lose my balance.    Currently in Pain? No/denies    Pain Onset 1 to 4 weeks ago             Assessed goals:   Home program- See goals section- Patient has good understanding of current home program.   FGA: 24/30  5 x STS= 16.03 sec without UE Support  TUG: 13 sec without an AD  6 Min walk test= 1020 feet with use of walking stick  10 MWT= 11.37 sec = 0.88 m/s  Clinical Impression: Patient presents with improvement in all areas- strength, functional mobility, and functional endurance. He has met several goals including his STG of independence with his HEP and no questions or issues. He is demo much improved overall LE strength as seen by improved 5 x STS with no UE support today. He was met his Timed Up and Go test goal and denies any falls. He is progressing with his functional endurance as seen by improved 6 min walk test. Patient has some vacation plans in July and has been instructed to continue to progress his walking and home program and will re-evaluate and possibly discharge next visit (7/22) if appropriate. Patient's condition has the potential to improve in response  to therapy. Maximum improvement is yet to be obtained. The anticipated improvement is attainable and reasonable in a generally predictable time.                       PT Education - 10/06/20 1653     Education Details functional outcome testing and review of HEP    Person(s) Educated Patient    Methods Explanation;Demonstration;Tactile cues;Verbal cues    Comprehension Verbalized understanding;Returned demonstration;Verbal cues required;Need further instruction;Tactile cues required              PT Short Term Goals - 10/06/20 1609       PT SHORT TERM GOAL #1   Title Pt will be independent with HEP in order to improve strength and balance in order to decrease fall risk and improve function at home and  work.    Baseline 08/09/2020- Patient with no HEP in place. 10/06/2020 - Patient report no issues with current home program and able to verbalize and demo correct technique.    Time 6    Period Weeks    Status Achieved    Target Date 09/20/20               PT Long Term Goals - 10/06/20 1611       PT LONG TERM GOAL #1   Title Pt will improve FOTO to  target score of 63  to display perceived improvements in ability to complete ADL's and improved self- perception of functional capabilities    Baseline 08/09/2020= 60    Time 12    Period Weeks    Status New      PT LONG TERM GOAL #2   Title Patient will increase Functional Gait Assessment score to >23/30 as to reduce fall risk and improve dynamic gait safety with community ambulation.    Baseline 08/09/2020- FGA 19/30; 10/06/2020= 24/30 without loss of balance.    Time 12    Period Weeks    Status Achieved      PT LONG TERM GOAL #3   Title Pt will decrease 5TSTS by at least 3 seconds in order to demonstrate clinically significant improvement in LE strength.    Baseline 08/09/2020= 23 sec with BUE support (hands on knees); 10/06/2020= 16.03 without UE support    Time 12    Period Weeks    Status Achieved      PT LONG TERM GOAL #4   Title Pt will decrease TUG to below 14 seconds/decrease in order to demonstrate decreased fall risk.    Baseline 08/09/2020- 16.03 sec without an AD; 10/06/2020= 13 sec without an AD (no unsteadiness or loss of balance).    Time 12    Period Weeks    Status Achieved      PT LONG TERM GOAL #5   Title Pt will increase 6MWT by at least 17m(1657f in order to demonstrate clinically significant improvement in cardiopulmonary endurance and community ambulation    Baseline 08/09/2020- 945 feet - Completed test.; 10/06/2020= 1020 feet with use of walking stick.    Time 12    Period Weeks    Status On-going    Target Date 11/01/20                   Plan - 10/06/20 1607     Clinical Impression Statement  Patient presents with improvement in all areas- strength, functional mobility, and functional endurance. He has met several goals including his STG of  independence with his HEP and no questions or issues. He is demo much improved overall LE strength as seen by improved 5 x STS with no UE support today. He was met his Timed Up and Go test goal and denies any falls. He is progressing with his functional endurance as seen by improved 6 min walk test. Patient has some vacation plans in July and has been instructed to continue to progress his walking and home program and will re-evaluate and possibly discharge next visit (7/22) if appropriate. Patient's condition has the potential to improve in response to therapy. Maximum improvement is yet to be obtained. The anticipated improvement is attainable and reasonable in a generally predictable time.    Personal Factors and Comorbidities Age;Comorbidity 3+    Comorbidities HTN, DM with neuropathy, Osteoarthritis    Examination-Activity Limitations Carry;Lift;Squat;Stand    Examination-Participation Restrictions Cleaning;Community Activity;Yard Work    Stability/Clinical Decision Making Stable/Uncomplicated    Rehab Potential Good    PT Frequency 2x / week    PT Duration 12 weeks    PT Treatment/Interventions ADLs/Self Care Home Management;Cryotherapy;DME Instruction;Gait training;Stair training;Therapeutic activities;Functional mobility training;Therapeutic exercise;Balance training;Neuromuscular re-education;Patient/family education;Manual techniques;Passive range of motion;Dry needling    PT Next Visit Plan Continue with LE strengthening and balance    PT Home Exercise Plan --    Consulted and Agree with Plan of Care Patient             Patient will benefit from skilled therapeutic intervention in order to improve the following deficits and impairments:  Abnormal gait, Cardiopulmonary status limiting activity, Decreased activity tolerance, Decreased  balance, Decreased endurance, Decreased mobility, Decreased strength, Difficulty walking, Impaired sensation, Impaired vision/preception  Visit Diagnosis: Abnormality of gait and mobility  Difficulty in walking, not elsewhere classified  Muscle weakness (generalized)  Other lack of coordination     Problem List Patient Active Problem List   Diagnosis Date Noted   Benign neoplasm of colon 07/19/2020   Chronic sinusitis 07/19/2020   Glaucoma 07/19/2020   Diabetic polyneuropathy associated with type 2 diabetes mellitus ([place]) 10/27/2019   Sensory ataxia 10/27/2019   Uncontrolled diabetes mellitus with diabetic neuropathy ([place]) 09/02/2019   Obesity, Class I, BMI 30-34.9 12/07/2017   DDD (degenerative disc disease), lumbar 05/16/2016   Advance directive discussed with patient 07/28/2015   Gouty arthropathy, chronic, without tophi 07/08/2015   Hypertension 07/08/2015   Obstructive sleep apnea 07/22/2014   Routine general medical examination at a health care facility 06/06/2011   BPH with obstruction/lower urinary tract symptoms    Generalized osteoarthritis of multiple sites 09/20/2006   Hyperlipidemia 09/17/2006   ALLERGIC RHINITIS 09/17/2006   DIVERTICULOSIS, COLON 09/17/2006    Lewis Moccasin, PT 10/06/2020, 5:06 PM  [place] MAIN [facility] SERVICES [address] [place], [place], [address] Phone: [phone]   Fax:  [phone]  Name: Lucas [name] MRN: [id number] Date of Birth: 1940/07/09

## 2020-10-07 ENCOUNTER — Ambulatory Visit (INDEPENDENT_AMBULATORY_CARE_PROVIDER_SITE_OTHER): Payer: Medicare Other

## 2020-10-07 ENCOUNTER — Ambulatory Visit (INDEPENDENT_AMBULATORY_CARE_PROVIDER_SITE_OTHER): Payer: Medicare Other | Admitting: Podiatry

## 2020-10-07 DIAGNOSIS — E1142 Type 2 diabetes mellitus with diabetic polyneuropathy: Secondary | ICD-10-CM | POA: Diagnosis not present

## 2020-10-07 DIAGNOSIS — M2041 Other hammer toe(s) (acquired), right foot: Secondary | ICD-10-CM

## 2020-10-07 DIAGNOSIS — S90121A Contusion of right lesser toe(s) without damage to nail, initial encounter: Secondary | ICD-10-CM | POA: Diagnosis not present

## 2020-10-12 ENCOUNTER — Encounter: Payer: Self-pay | Admitting: Podiatry

## 2020-10-12 NOTE — Progress Notes (Signed)
Subjective:  Patient ID: Lucas [name], male    DOB: [birth year],  MRN: [id number]  Chief Complaint  Patient presents with   Toe Injury    RIGHT FOOT 3RD TOE PAINFUL, PT SCRAPED IT ON THE DOOR AND PT IS CONCERNED ABOUT SENSITIVITY    80 y.o. male presents with the above complaint.  Patient presents with complaining of right third digit very mild contusion.  Patient states he hit it against a door on October 05, 2020.  He states that it is little bit tender there is redness.  There are some swelling no pain.  Patient does have some neuropathy from diabetes.  He wanted to get evaluated make sure is not forming into an ulcer.  He denies any other acute complaints he has not seen anyone else prior to see me for this.  He is a diabetic with last A1c of 7%   Review of Systems: Negative except as noted in the HPI. Denies N/V/F/Ch.  Past Medical History:  Diagnosis Date   Allergic rhinitis    Diabetes mellitus type II    Diverticulosis of colon    Glaucoma    peripheral vision loss left eye.    History of agent Orange exposure    HLD (hyperlipidemia)    HTN (hypertension)    Hypertrophy of prostate without urinary obstruction and other lower urinary tract symptoms (LUTS)    Osteoarthritis     Current Outpatient Medications:    albuterol (VENTOLIN HFA) 108 (90 Base) MCG/ACT inhaler, Inhale 2 puffs into the lungs every 4 (four) hours as needed., Disp: , Rfl:    Alpha-Lipoic Acid 600 MG CAPS, Take 1 capsule (600 mg total) by mouth in the morning and at bedtime., Disp: 60 capsule, Rfl: 3   aspirin 81 MG tablet, Take 81 mg by mouth daily., Disp: , Rfl:    brimonidine (ALPHAGAN) 0.2 % ophthalmic solution, Place 1 drop into both eyes 2 (two) times daily. , Disp: , Rfl:    cetirizine (ZYRTEC) 10 MG tablet, Take 10 mg by mouth daily., Disp: , Rfl:    colchicine 0.6 MG tablet, Take 0.6 mg by mouth as needed. Take 2 tablets by mouth at first sign of gout flare followed by 1 tablet after 1 hour. Next  day 1 tab;et for 5 days, Disp: , Rfl:    COMBIGAN 0.2-0.5 % ophthalmic solution, , Disp: , Rfl:    fluticasone (FLONASE) 50 MCG/ACT nasal spray, , Disp: , Rfl:    gabapentin (NEURONTIN) 300 MG capsule, TAKE 2 CAPSULES (600 MG TOTAL) BY MOUTH 3 (THREE) TIMES DAILY., Disp: 540 capsule, Rfl: 3   glipiZIDE (GLUCOTROL XL) 5 MG 24 hr tablet, TAKE 2 TABLETS BY MOUTH IN THE AM AND 1 TABLET BEFORE DINNER, Disp: 270 tablet, Rfl: 2   glucose blood (ONE TOUCH ULTRA TEST) test strip, Use as instructed to check sugar 2 times daily, Disp: 200 each, Rfl: 5   indomethacin (INDOCIN) 50 MG capsule, Take 1 capsule (50 mg total) by mouth 2 (two) times daily with a meal. (Patient taking differently: Take 50 mg by mouth 2 (two) times daily as needed.), Disp: 60 capsule, Rfl: 1   insulin degludec (TRESIBA FLEXTOUCH) 200 UNIT/ML FlexTouch Pen, Inject 12 units daily under skin (Patient taking differently: 20 Units. Inject 20 units daily under skin), Disp: 9 mL, Rfl: 3   Insulin Pen Needle 32G X 4 MM MISC, Use 1x a day, Disp: 100 each, Rfl: 3   latanoprost (XALATAN) 0.005 % ophthalmic  solution, Place 1 drop into both eyes at bedtime. , Disp: , Rfl:    lisinopril (PRINIVIL,ZESTRIL) 20 MG tablet, Take 10 mg by mouth daily., Disp: , Rfl:    metFORMIN (GLUCOPHAGE) 1000 MG tablet, Take 1,000 mg by mouth 2 (two) times daily with a meal., Disp: , Rfl:    Misc Natural Products (GLUCOSAMINE CHONDROITIN ADV PO), Take 2 tablets by mouth 2 (two) times daily., Disp: , Rfl:    Multiple Vitamin (MULTIVITAMIN) tablet, Take 1 tablet by mouth daily., Disp: , Rfl:    mupirocin ointment (BACTROBAN) 2 %, Apply to wound twice a day. (Patient not taking: Reported on 09/08/2020), Disp: 30 g, Rfl: 2   Omega-3 Fatty Acids (FISH OIL) 1200 MG CAPS, , Disp: , Rfl:    pravastatin (PRAVACHOL) 20 MG tablet, Take 10 mg by mouth daily. , Disp: , Rfl:    sildenafil (REVATIO) 20 MG tablet, TAKE 3 TO 5 TABLETS BY MOUTH ONCE DAILY AS NEEDED, Disp: 50 tablet, Rfl:  11   triamcinolone ointment (KENALOG) 0.5 %, Apply 1 application topically 2 (two) times daily., Disp: 30 g, Rfl: 0   TRULICITY 3 AT/5.5DD SOPN, INJECT 3 MG INTO THE SKIN ONCE A WEEK., Disp: 6 mL, Rfl: 1   ULORIC 40 MG tablet, Take 40 mg by mouth daily., Disp: , Rfl: 4   Vitamin D, Cholecalciferol, 1000 UNITS CAPS, Take 1 capsule by mouth daily., Disp: , Rfl:   Social History   Tobacco Use  Smoking Status Never  Smokeless Tobacco Never    No Known Allergies Objective:  There were no vitals filed for this visit. There is no height or weight on file to calculate BMI. Constitutional Well developed. Well nourished.  Vascular Dorsalis pedis pulses palpable bilaterally. Posterior tibial pulses palpable bilaterally. Capillary refill normal to all digits.  No cyanosis or clubbing noted. Pedal hair growth normal.  Neurologic Normal speech. Oriented to person, place, and time. Epicritic sensation to light touch grossly present bilaterally.  Dermatologic Nails within normal limits.  No signs of contusion noted to the right third digit.  No signs of ulceration noted and hematoma noted or blisters noted. Skin within normal limits  Orthopedic: Normal joint ROM without pain or crepitus bilaterally. No visible deformities. No bony tenderness.   Radiographs: 3 views of skeletally mature the right foot: No fractures noted hammertoe contracture noted.  No bony abnormalities identified. Assessment:   1. Contusion of third toe of right foot, initial encounter   2. Diabetic polyneuropathy associated with type 2 diabetes mellitus ([place])    Plan:  Patient was evaluated and treated and all questions answered.  Right third digit contusion without damage to the nail very mild -The patient the etiology of contusion various treatment options were discussed.  Given that this is very mild in nature without any signs of trauma to the digit I believe patient will benefit from clinically monitoring for now.   He does not need to be in surgical shoe as there is no damage done to the digit.  X-rays show negative for any kind of bony involvement/fractures   No follow-ups on file.

## 2020-10-29 ENCOUNTER — Ambulatory Visit: Payer: Medicare Other | Attending: Podiatry

## 2020-10-29 DIAGNOSIS — M6281 Muscle weakness (generalized): Secondary | ICD-10-CM | POA: Insufficient documentation

## 2020-10-29 DIAGNOSIS — R269 Unspecified abnormalities of gait and mobility: Secondary | ICD-10-CM | POA: Insufficient documentation

## 2020-10-29 DIAGNOSIS — R2689 Other abnormalities of gait and mobility: Secondary | ICD-10-CM | POA: Insufficient documentation

## 2020-10-29 DIAGNOSIS — R262 Difficulty in walking, not elsewhere classified: Secondary | ICD-10-CM | POA: Insufficient documentation

## 2020-11-01 ENCOUNTER — Ambulatory Visit: Payer: Medicare Other

## 2020-11-01 ENCOUNTER — Other Ambulatory Visit: Payer: Self-pay

## 2020-11-01 DIAGNOSIS — R269 Unspecified abnormalities of gait and mobility: Secondary | ICD-10-CM

## 2020-11-01 DIAGNOSIS — R262 Difficulty in walking, not elsewhere classified: Secondary | ICD-10-CM | POA: Diagnosis not present

## 2020-11-01 DIAGNOSIS — R2689 Other abnormalities of gait and mobility: Secondary | ICD-10-CM

## 2020-11-01 DIAGNOSIS — M6281 Muscle weakness (generalized): Secondary | ICD-10-CM | POA: Diagnosis not present

## 2020-11-01 NOTE — Therapy (Signed)
[place] MAIN [facility] SERVICES [address] [place], [place], [address] Phone: [phone]   Fax:  [phone]  Physical Therapy Treatment/Discharge summary.   Patient Details  Name: Lucas [name] MRN: [id number] Date of Birth: [birth year] Referring Provider (PT): Dr. Bennie Pierini T. Hyatt   Encounter Date: 11/01/2020   PT End of Session - 11/01/20 1738     Visit Number 11    Number of Visits 25    Date for PT Re-Evaluation 11/01/20    Authorization Time Period 08/09/2020- 11/01/2020    PT Start Time 1515    PT Stop Time 1553    PT Time Calculation (min) 38 min    Equipment Utilized During Treatment Gait belt    Activity Tolerance Patient tolerated treatment well    Behavior During Therapy WFL for tasks assessed/performed             Past Medical History:  Diagnosis Date   Allergic rhinitis    Diabetes mellitus type II    Diverticulosis of colon    Glaucoma    peripheral vision loss left eye.    History of agent Orange exposure    HLD (hyperlipidemia)    HTN (hypertension)    Hypertrophy of prostate without urinary obstruction and other lower urinary tract symptoms (LUTS)    Osteoarthritis     Past Surgical History:  Procedure Laterality Date   TOOTH EXTRACTION      There were no vitals filed for this visit.   Subjective Assessment - 11/01/20 1728     Subjective Patient states he has been walking and more active overall. He states he is knowledgeable of his home program.    Pertinent History Patient has referrral from Podiatry for balance and gait impairments and past medical history including HTN, Obstructive sleep apnea, Sinusitis, Diverticulosis, Diabetes with neuropathy, Osteoarthritis, DDD (lumbar), Gout, BPH, HLD, Glaucoma.    Limitations Lifting;Standing;Walking    How long can you sit comfortably? No limits    How long can you stand comfortably? 10 min    How long can you walk comfortably? 10 min    Patient Stated Goals  I want to be able to walk a mile and not lose my balance.    Currently in Pain? No/denies    Pain Onset 1 to 4 weeks ago            Interventions:   Assessed LTGs:  FOTO= 69 (goals was 63)  6 min walk test= 1050 feet with use of walking stick (improved from 945 feet initiallly)    Reviewed entire home program including walking, LE strengthening, and balance home program. Patient able to verbalize and demonstrate standing Hip march, Hip abd, Hip ext, knee flex, Mini squats, calf raises/toe raises without difficulty.   Patient performed balance exercises in //bars: Static stand with feet apart, then together, then staggered, tandem, and Single leg stance. Patient able to perform without loss of balance and good understanding of all above home program.   Clinical Impression: Patient returns to clinic after being on his own for last 2 weeks focusing on transition to a home program. He has improved in all areas and has met his goals including strength, TUG, and FGA. He demo improvement in 6 min walk test and able to complete but just shy of distance. He and met his FOTO outcome score indicating improved self perceived abilities. He also continues to deny any falls and demonstrated good understanding of current Home program designed  for mobility, strength, and balance. He is appropriate for discharge with goals met and to continue on his own with current home program.                            PT Education - 11/01/20 1737     Education Details Review of HEP    Person(s) Educated Patient    Methods Explanation;Demonstration;Verbal cues    Comprehension Verbalized understanding;Returned demonstration              PT Short Term Goals - 10/06/20 1609       PT SHORT TERM GOAL #1   Title Pt will be independent with HEP in order to improve strength and balance in order to decrease fall risk and improve function at home and work.    Baseline 08/09/2020- Patient with no  HEP in place. 10/06/2020 - Patient report no issues with current home program and able to verbalize and demo correct technique.    Time 6    Period Weeks    Status Achieved    Target Date 09/20/20               PT Long Term Goals - 11/01/20 1526       PT LONG TERM GOAL #1   Title Pt will improve FOTO to  target score of 63  to display perceived improvements in ability to complete ADL's and improved self- perception of functional capabilities    Baseline 08/09/2020= 60; 11/01/2020= 69    Time 12    Period Weeks    Status Achieved      PT LONG TERM GOAL #2   Title Patient will increase Functional Gait Assessment score to >23/30 as to reduce fall risk and improve dynamic gait safety with community ambulation.    Baseline 08/09/2020- FGA 19/30; 10/06/2020= 24/30 without loss of balance.    Time 12    Period Weeks    Status Achieved      PT LONG TERM GOAL #3   Title Pt will decrease 5TSTS by at least 3 seconds in order to demonstrate clinically significant improvement in LE strength.    Baseline 08/09/2020= 23 sec with BUE support (hands on knees); 10/06/2020= 16.03 without UE support    Time 12    Period Weeks    Status Achieved      PT LONG TERM GOAL #4   Title Pt will decrease TUG to below 14 seconds/decrease in order to demonstrate decreased fall risk.    Baseline 08/09/2020- 16.03 sec without an AD; 10/06/2020= 13 sec without an AD (no unsteadiness or loss of balance).    Time 12    Period Weeks    Status Achieved      PT LONG TERM GOAL #5   Title Pt will increase 6MWT by at least 79m(1681f in order to demonstrate clinically significant improvement in cardiopulmonary endurance and community ambulation    Baseline 08/09/2020- 945 feet - Completed test.; 10/06/2020= 1020 feet with use of walking stick. 11/01/2020= 1050.    Time 12    Period Weeks    Status Partially Met                   Plan - 11/01/20 1606     Clinical Impression Statement Patient returns to clinic  after being on his own for last 2 weeks focusing on transition to a home program. He has improved in all areas and  has met his goals including strength, TUG, and FGA. He demo improvement in 6 min walk test and able to complete but just shy of distance. He and met his FOTO outcome score indicating improved self perceived abilities. He also continues to deny any falls and demonstrated good understanding of current Home program designed for mobility, strength, and balance. He is appropriate for discharge with goals met and to continue on his own with current home program.    Personal Factors and Comorbidities Age;Comorbidity 3+    Comorbidities HTN, DM with neuropathy, Osteoarthritis    Examination-Activity Limitations Carry;Lift;Squat;Stand    Examination-Participation Restrictions Cleaning;Community Activity;Yard Work    Stability/Clinical Decision Making Stable/Uncomplicated    Rehab Potential Good    PT Frequency 2x / week    PT Duration 12 weeks    PT Treatment/Interventions ADLs/Self Care Home Management;Cryotherapy;DME Instruction;Gait training;Stair training;Therapeutic activities;Functional mobility training;Therapeutic exercise;Balance training;Neuromuscular re-education;Patient/family education;Manual techniques;Passive range of motion;Dry needling    PT Next Visit Plan Continue with LE strengthening and balance    Consulted and Agree with Plan of Care Patient             Patient will benefit from skilled therapeutic intervention in order to improve the following deficits and impairments:  Abnormal gait, Cardiopulmonary status limiting activity, Decreased activity tolerance, Decreased balance, Decreased endurance, Decreased mobility, Decreased strength, Difficulty walking, Impaired sensation, Impaired vision/preception  Visit Diagnosis: Abnormality of gait and mobility  Difficulty in walking, not elsewhere classified  Muscle weakness (generalized)  Other abnormalities of gait and  mobility     Problem List Patient Active Problem List   Diagnosis Date Noted   Benign neoplasm of colon 07/19/2020   Chronic sinusitis 07/19/2020   Glaucoma 07/19/2020   Diabetic polyneuropathy associated with type 2 diabetes mellitus ([place]) 10/27/2019   Sensory ataxia 10/27/2019   Uncontrolled diabetes mellitus with diabetic neuropathy ([place]) 09/02/2019   Obesity, Class I, BMI 30-34.9 12/07/2017   DDD (degenerative disc disease), lumbar 05/16/2016   Advance directive discussed with patient 07/28/2015   Gouty arthropathy, chronic, without tophi 07/08/2015   Hypertension 07/08/2015   Obstructive sleep apnea 07/22/2014   Routine general medical examination at a health care facility 06/06/2011   BPH with obstruction/lower urinary tract symptoms    Generalized osteoarthritis of multiple sites 09/20/2006   Hyperlipidemia 09/17/2006   ALLERGIC RHINITIS 09/17/2006   DIVERTICULOSIS, COLON 09/17/2006    Lewis Moccasin, PT 11/01/2020, 5:42 PM  [place] MAIN [facility] SERVICES [address] [place], [place], [address] Phone: [phone]   Fax:  [phone]  Name: Lucas [name] MRN: [id number] Date of Birth: 1941/01/30

## 2020-11-03 ENCOUNTER — Ambulatory Visit: Payer: Medicare Other

## 2020-11-09 ENCOUNTER — Ambulatory Visit: Payer: Medicare Other

## 2020-11-11 ENCOUNTER — Ambulatory Visit: Payer: Medicare Other

## 2020-11-15 ENCOUNTER — Ambulatory Visit: Payer: Medicare Other

## 2020-11-16 DIAGNOSIS — H5201 Hypermetropia, right eye: Secondary | ICD-10-CM | POA: Diagnosis not present

## 2020-11-16 DIAGNOSIS — H401233 Low-tension glaucoma, bilateral, severe stage: Secondary | ICD-10-CM | POA: Diagnosis not present

## 2020-11-18 ENCOUNTER — Ambulatory Visit: Payer: Medicare Other

## 2020-11-22 ENCOUNTER — Ambulatory Visit: Payer: Medicare Other | Admitting: Physical Therapy

## 2020-11-22 NOTE — Telephone Encounter (Signed)
error 

## 2020-11-25 ENCOUNTER — Ambulatory Visit: Payer: Medicare Other | Admitting: Internal Medicine

## 2020-11-30 ENCOUNTER — Ambulatory Visit: Payer: Medicare Other

## 2020-12-02 ENCOUNTER — Ambulatory Visit: Payer: Medicare Other

## 2020-12-07 ENCOUNTER — Ambulatory Visit: Payer: Medicare Other

## 2020-12-09 ENCOUNTER — Ambulatory Visit: Payer: Medicare Other

## 2020-12-14 ENCOUNTER — Ambulatory Visit (INDEPENDENT_AMBULATORY_CARE_PROVIDER_SITE_OTHER): Payer: Medicare Other | Admitting: Internal Medicine

## 2020-12-14 ENCOUNTER — Encounter: Payer: Self-pay | Admitting: Internal Medicine

## 2020-12-14 ENCOUNTER — Other Ambulatory Visit: Payer: Self-pay

## 2020-12-14 VITALS — BP 120/70 | HR 94 | Ht 70.5 in | Wt 220.0 lb

## 2020-12-14 DIAGNOSIS — E1140 Type 2 diabetes mellitus with diabetic neuropathy, unspecified: Secondary | ICD-10-CM

## 2020-12-14 DIAGNOSIS — E1165 Type 2 diabetes mellitus with hyperglycemia: Secondary | ICD-10-CM

## 2020-12-14 DIAGNOSIS — E669 Obesity, unspecified: Secondary | ICD-10-CM | POA: Diagnosis not present

## 2020-12-14 DIAGNOSIS — IMO0002 Reserved for concepts with insufficient information to code with codable children: Secondary | ICD-10-CM

## 2020-12-14 DIAGNOSIS — E785 Hyperlipidemia, unspecified: Secondary | ICD-10-CM

## 2020-12-14 LAB — POCT GLYCOSYLATED HEMOGLOBIN (HGB A1C): Hemoglobin A1C: 6.9 % — AB (ref 4.0–5.6)

## 2020-12-14 NOTE — Progress Notes (Signed)
Patient ID: Lucas [name], male   DOB: 1940-06-03, 80 y.o.   MRN: [id number]  This visit occurred during the SARS-CoV-2 public health emergency.  Safety protocols were in place, including screening questions prior to the visit, additional usage of staff PPE, and extensive cleaning of exam room while observing appropriate contact time as indicated for disinfecting solutions.   HPI: Lucas [name] is a 80 y.o.-year-old male, presenting for f/u for DM2, dx in 1998, insulin-dependent (previous Agent Orange exposure in [place]), uncontrolled, with complications (mild CKD, foot ulcer). Last visit 5 months ago.  Interim hx: No increased urination, blurry vision, nausea, chest pain.  He still has constipation-on MiraLAX daily.  He recently was on a road trip and was more active and he did not have problems with constipation then.  Reviewed HbA1c levels: Lab Results  Component Value Date   HGBA1C 6.7 (A) 07/05/2020   HGBA1C 7.8 (A) 03/01/2020   HGBA1C 7.8 (A) 10/27/2019   HGBA1C 8.2 (A) 07/21/2019   HGBA1C 7.6 (A) 03/17/2019   HGBA1C 8.1 (H) 09/24/2018   HGBA1C 7.9 (A) 03/18/2018   HGBA1C 7.6 (A) 12/07/2017   HGBA1C 7.8 08/06/2017   HGBA1C 7.4 05/10/2017   HGBA1C 8.3 11/29/2016   HGBA1C 7.7 05/08/2016   HGBA1C 7.7 12/17/2015   HGBA1C 8.8 (H) 07/28/2015   HGBA1C 7.2 02/05/2015   HGBA1C 6.7 11/05/2014   HGBA1C 9.1 (A) 06/29/2014   HGBA1C 8.7 (H) 01/13/2014   HGBA1C 7.9 (H) 07/14/2013   HGBA1C 7.6 (H) 12/31/2012   HGBA1C 7.8 (H) 07/02/2012   HGBA1C 8.4 (H) 02/27/2012   HGBA1C 7.9 (H) 12/06/2011   HGBA1C 8.1 (H) 06/06/2011   HGBA1C 7.7 (H) 02/16/2011   Pt is on a regimen of: - Metformin 2000 mg with dinner - Glipizide XL 10 mg before breakfast and 5 mg before dinner - Trulicity 1.5 >> 3 mg weekly - Tresiba 12 - started 02/2020 >> 20 >> 12 units daily (? Why decreased)  He had to stop [place] as K increased (took 1 mo, off for 1.5 mo). Invokana was not covered.  Pt checks his sugars  1-2 times a day - am:  147-195, 202, 210 >> 79, 89-148 >> 65, 75-152, 163, 181 (missed insulin) - 2h after b'fast: 312 >> n/c >> 220 >> n/c >> 233 >> n/c - before lunch: 98, 134 >> 67 >> 84, 88-171, 212 >> n/c >> 101-118 - 2h after lunch: n/c >> 114-178 >> n/c >> 154 >> n/c >> 114-137 - before dinner: 85-163 >> 116-165, 172 >> n/c >> 95-123 >> n/c >> 85 - 2h after dinner:n/c  >> 160 >> 167, 172 >> 160 >> n/c - bedtime:  109-130 >> n/c >> 137 >> n/c - nighttime: n/c >> 165 >> n/c >> 117 >> n/c Lowest sugar was 60  >> 67 (delayed meal) >> 73 >> 79 >> 65; he has hypoglycemia awareness in the 70s. Highest sugar was 200  - [employer] >> 281  >> 200 x1 >> 181  Glucometer: Precision >> One Touch Ultra mini >> [place] >> BioTel  Pt's meals are: - Breakfast:  Oatmeal, wheat English muffin  - Lunch: salad, 1/2 sandwich - Dinner: meat + veggies, added carbs back - Snacks: 2x a day, fruit bar, fresh fruit - snacking after dinner  + Mild CKD: Lab Results  Component Value Date   BUN 23 09/08/2020   BUN 23 09/08/2020   CREATININE 1.22 09/08/2020   CREATININE 1.22 09/08/2020  04/16/2017: 15/1.2 07/13/2016:  BUN/Cr 13/1.0, Glu 116, UACR 0.5 11/10/2015: EGFR 80, creatinine 1.1 04/15/2015: GFR 88.1, Cr 1.06 On lisinopril.  + HL; last set of lipids: Lab Results  Component Value Date   CHOL 201 (H) 09/08/2020   HDL 35.20 (L) 09/08/2020   LDLCALC 104 07/13/2016   LDLDIRECT 94.0 09/08/2020   TRIG (H) 09/08/2020    467.0 Triglyceride is over 400; calculations on Lipids are invalid.   CHOLHDL 6 09/08/2020  04/16/2016: 179/288/44/77 04/15/2015: LDL 89 On pravastatin 10. On fish oil 1000 mg daily.  - last dilated eye exam was in 2022: No DR, + glaucoma. 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  - + numbness and tingling in his feet.  He is on alpha-lipoic acid by neurology.  He was in a DM clinical trial before: "Jump Start"  lifestyle Research Trial. He was on the [employer] and  lifestyle program in the past.  He also has a history of HTN, ED. He has gout - prev. on Alopurinol >> then  on Uloric(he had a rash with allopurinol) He had a R foot ulcer 04/2017 and again in 11/2017. He is seen by a vascular dr.  Lynnell Dike were normal then. In 11/2017 he was getting Krysexxa injections for gout.  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  ROS: Constitutional: no weight gain/no weight loss, no fatigue, no subjective hyperthermia, no subjective hypothermia Eyes: no blurry vision, no xerophthalmia ENT: no sore throat, no nodules palpated in neck, no dysphagia, no odynophagia, no hoarseness Cardiovascular: no CP/no SOB/no palpitations/no leg swelling Respiratory: no cough/no SOB/no wheezing Gastrointestinal: no N/no V/no D/+ C/no acid reflux Musculoskeletal: no muscle aches/no joint aches Skin: no rashes, no hair loss Neurological: no tremors/+ numbness/+ tingling/no dizziness  I reviewed pt's medications, allergies, PMH, social hx, family hx, and changes were documented in the history of present illness. Otherwise, unchanged from my initial visit note.  Past Medical History:  Diagnosis Date   Allergic rhinitis    Diabetes mellitus type II    Diverticulosis of colon    Glaucoma    peripheral vision loss left eye.    History of agent Orange exposure    HLD (hyperlipidemia)    HTN (hypertension)    Hypertrophy of prostate without urinary obstruction and other lower urinary tract symptoms (LUTS)    Osteoarthritis    Past Surgical History:  Procedure Laterality Date   TOOTH EXTRACTION       History   Social History   Marital Status: Married    Spouse Name: N/A   Number of Children: 2   Occupational History   Retired-[employer]    Social History Main Topics   Smoking status: Never Smoker    Smokeless tobacco: Never Used   Alcohol Use: No   Drug Use: No    [place] vet- 20% disability from shrapnel wounds 1967 and 20% agent orange    Married (Widowed 1996; remarried 6/03)   Retired [employer]      Has living will   Wife, then daughter Hoyle Barr will be health care POA   Would want attempts at resuscitation   Would not want feeding tube if cognitively unaware   Current Outpatient Medications on File Prior to Visit  Medication Sig Dispense Refill   albuterol (VENTOLIN HFA) 108 (90 Base) MCG/ACT inhaler Inhale 2 puffs into the lungs every 4 (four) hours as needed.     Alpha-Lipoic Acid 600 MG CAPS Take 1  capsule (600 mg total) by mouth in the morning and at bedtime. 60 capsule 3   aspirin 81 MG tablet Take 81 mg by mouth daily.     brimonidine (ALPHAGAN) 0.2 % ophthalmic solution Place 1 drop into both eyes 2 (two) times daily.      cetirizine (ZYRTEC) 10 MG tablet Take 10 mg by mouth daily.     colchicine 0.6 MG tablet Take 0.6 mg by mouth as needed. Take 2 tablets by mouth at first sign of gout flare followed by 1 tablet after 1 hour. Next day 1 tab;et for 5 days     COMBIGAN 0.2-0.5 % ophthalmic solution      fluticasone (FLONASE) 50 MCG/ACT nasal spray      gabapentin (NEURONTIN) 300 MG capsule TAKE 2 CAPSULES (600 MG TOTAL) BY MOUTH 3 (THREE) TIMES DAILY. 540 capsule 3   glipiZIDE (GLUCOTROL XL) 5 MG 24 hr tablet TAKE 2 TABLETS BY MOUTH IN THE AM AND 1 TABLET BEFORE DINNER 270 tablet 2   glucose blood (ONE TOUCH ULTRA TEST) test strip Use as instructed to check sugar 2 times daily 200 each 5   indomethacin (INDOCIN) 50 MG capsule Take 1 capsule (50 mg total) by mouth 2 (two) times daily with a meal. (Patient taking differently: Take 50 mg by mouth 2 (two) times daily as needed.) 60 capsule 1   insulin degludec (TRESIBA FLEXTOUCH) 200 UNIT/ML FlexTouch Pen Inject 12 units daily under skin (Patient taking differently: 20 Units. Inject 20 units daily under skin) 9 mL 3   Insulin Pen Needle 32G X 4 MM MISC Use 1x a day 100 each 3   latanoprost (XALATAN) 0.005 % ophthalmic solution Place 1 drop into both eyes at bedtime.       lisinopril (PRINIVIL,ZESTRIL) 20 MG tablet Take 10 mg by mouth daily.     metFORMIN (GLUCOPHAGE) 1000 MG tablet Take 1,000 mg by mouth 2 (two) times daily with a meal.     Misc Natural Products (GLUCOSAMINE CHONDROITIN ADV PO) Take 2 tablets by mouth 2 (two) times daily.     Multiple Vitamin (MULTIVITAMIN) tablet Take 1 tablet by mouth daily.     mupirocin ointment (BACTROBAN) 2 % Apply to wound twice a day. (Patient not taking: Reported on 09/08/2020) 30 g 2   Omega-3 Fatty Acids (FISH OIL) 1200 MG CAPS      pravastatin (PRAVACHOL) 20 MG tablet Take 10 mg by mouth daily.      sildenafil (REVATIO) 20 MG tablet TAKE 3 TO 5 TABLETS BY MOUTH ONCE DAILY AS NEEDED 50 tablet 11   triamcinolone ointment (KENALOG) 0.5 % Apply 1 application topically 2 (two) times daily. 30 g 0   TRULICITY 3 UV/7.5YN SOPN INJECT 3 MG INTO THE SKIN ONCE A WEEK. 6 mL 1   ULORIC 40 MG tablet Take 40 mg by mouth daily.  4   Vitamin D, Cholecalciferol, 1000 UNITS CAPS Take 1 capsule by mouth daily.     No current facility-administered medications on file prior to visit.   No Known Allergies Family History  Problem Relation Age of Onset   Colon cancer Father    Stroke Mother    Heart attack Mother    Coronary artery disease Mother    Hypertension Other        several siblings   Diabetes Other        several siblings   Coronary artery disease Brother    ALS Sister    Dementia Sister  Neuropathy Neg Hx    PE: BP 120/70 (BP Location: Right Arm, Patient Position: Sitting, Cuff Size: Normal)   Pulse 94   Ht 5' 10.5" (1.791 m)   Wt 220 lb (99.8 kg)   SpO2 96%   BMI 31.12 kg/m  Body mass index is 31.12 kg/m. Wt Readings from Last 3 Encounters:  12/14/20 220 lb (99.8 kg)  09/08/20 216 lb (98 kg)  08/09/20 215 lb (97.5 kg)   Constitutional: overweight, in NAD Eyes: PERRLA, EOMI, no exophthalmos ENT: moist mucous membranes, no thyromegaly, no cervical lymphadenopathy Cardiovascular: RRR, No MRG Respiratory:  CTA B Gastrointestinal: abdomen soft, NT, ND, BS+ Musculoskeletal: no deformities, strength intact in all 4 Skin: moist, warm, no rashes Neurological: no tremor with outstretched hands, DTR normal in all 4  ASSESSMENT: 1. DM2, insulin-dependent, uncontrolled, with complications - R foot ulcer. Nl ABIs - mild CKD  2. HL  3.  Obesity class I  PLAN:  1. Patient with longstanding, uncontrolled, type 2 diabetes, on oral antidiabetic regimen with metformin and sulfonylurea and also weekly GLP-1 receptor agonist and daily long-acting insulin, with improvement of his diabetes control after adding insulin.  At last visit, sugars are mostly at goal with few mildly hyperglycemic values in the morning, but due to the significantly improved blood sugars, we did not change his regimen at last visit.  At that time he was also planning to increase his activity and improve his diet.  HbA1c was better, at 6.7%. -At today's visit, sugars appear to be slightly higher in the morning in the last 3 weeks.  Upon questioning, he is only taking 12 units of Tresiba compared to the recommended 20 units.  However, at the beginning of last month, he had some lower blood sugars, 1 even at 65.  He mentions that he was not eating well around that time, missing meals.  Because of this, I would be reticent to increase his Tyler Aas to much but I did advise him that he can increase the dose to 14 units if the sugars remain elevated in the morning.  We can continue the rest of the regimen.  - I suggested to:  Patient Instructions  Please continue: - Metformin 2000 mg with dinner - Glipizide XL 10 mg in a.m. and 5 mg before dinner - Trulicity 3 mg weekly  You can use: - Tresiba 12-14 units daily  Please increase Fish oil to 1000 mg 2x a day.  Please return in 4 months with your sugar log.   - we checked his HbA1c: 6.9% (slightly higher) - advised to check sugars at different times of the day - 1-2x a day, rotating check  times - advised for yearly eye exams >> he is UTD - return to clinic in 4 months  2. HL -Reviewed latest lipid panel from 09/2020: LDL slightly above our target of less than 70, triglycerides high, HDL low: Lab Results  Component Value Date   CHOL 201 (H) 09/08/2020   HDL 35.20 (L) 09/08/2020   LDLCALC 104 07/13/2016   LDLDIRECT 94.0 09/08/2020   TRIG (H) 09/08/2020    467.0 Triglyceride is over 400; calculations on Lipids are invalid.   CHOLHDL 6 09/08/2020  -He continues on pravastatin 10 mg daily without side effects.  Also on omega-3 fatty acids -but only takes 1000 units daily .I advised him to increase the dose to 1000 mg 2x a day.  3. Obesity class I -We will continue Trulicity which should also help with weight  loss -He gained 9 pounds before last visit, after starting insulin -He gained 1 pound since last visit  Philemon Kingdom, MD PhD [facility] Endocrinology

## 2020-12-14 NOTE — Patient Instructions (Addendum)
Please continue: - Metformin 2000 mg with dinner - Glipizide XL 10 mg in a.m. and 5 mg before dinner - Trulicity 3 mg weekly  You can use: - Tresiba 12-14 units daily  Please increase Fish oil to 1000 mg 2x a day.  Please return in 4 months with your sugar log.

## 2021-01-04 ENCOUNTER — Other Ambulatory Visit: Payer: Self-pay | Admitting: Internal Medicine

## 2021-01-10 ENCOUNTER — Other Ambulatory Visit: Payer: Self-pay | Admitting: Internal Medicine

## 2021-01-26 ENCOUNTER — Other Ambulatory Visit: Payer: Self-pay

## 2021-01-26 ENCOUNTER — Encounter: Payer: Self-pay | Admitting: Podiatry

## 2021-01-26 ENCOUNTER — Ambulatory Visit (INDEPENDENT_AMBULATORY_CARE_PROVIDER_SITE_OTHER): Payer: Medicare Other | Admitting: Podiatry

## 2021-01-26 DIAGNOSIS — E1142 Type 2 diabetes mellitus with diabetic polyneuropathy: Secondary | ICD-10-CM | POA: Diagnosis not present

## 2021-01-26 NOTE — Progress Notes (Signed)
He presents today for follow-up of his diabetic exam for 6 months.  States that his hemoglobin A1c is at 6.9 and overall he is doing quite well does not have a lot of problem feels that he is having more problems with his sight and hearing than his balance and gait at this point.  Objective: Vital signs are stable he is alert and oriented x3.  Pulses are palpable.  There is no erythema edema cellulitis drainage or odor.  No significant deformities other than mild HAV.  History of ulceration of the medial aspect of the first metatarsophalangeal joint secondary to gout has gone on to heal up.  Neurologic sensorium is intact for Semmes-Weinstein monofilament deep tendon reflexes are intact vibratory sensation is intact muscle strength is normal symmetrical bilateral.  No open lesions or wounds at this point in time.  Assessment: Diabetic peripheral neuropathy without complications.  Plan: Follow-up with [place] as needed or in 6 months.

## 2021-03-17 DIAGNOSIS — H401233 Low-tension glaucoma, bilateral, severe stage: Secondary | ICD-10-CM | POA: Diagnosis not present

## 2021-03-17 DIAGNOSIS — H2513 Age-related nuclear cataract, bilateral: Secondary | ICD-10-CM | POA: Diagnosis not present

## 2021-03-18 ENCOUNTER — Ambulatory Visit (INDEPENDENT_AMBULATORY_CARE_PROVIDER_SITE_OTHER): Payer: Medicare Other

## 2021-03-18 ENCOUNTER — Other Ambulatory Visit: Payer: Self-pay

## 2021-03-18 DIAGNOSIS — Z23 Encounter for immunization: Secondary | ICD-10-CM | POA: Diagnosis not present

## 2021-03-22 ENCOUNTER — Telehealth: Payer: Self-pay | Admitting: Internal Medicine

## 2021-03-22 DIAGNOSIS — E1142 Type 2 diabetes mellitus with diabetic polyneuropathy: Secondary | ICD-10-CM

## 2021-03-22 MED ORDER — TRULICITY 3 MG/0.5ML ~~LOC~~ SOAJ: 3.0000 mg | SUBCUTANEOUS | 1 refills | Status: DC

## 2021-03-22 NOTE — Telephone Encounter (Signed)
Rx sent to preferred pharmacy.

## 2021-03-22 NOTE — Telephone Encounter (Signed)
PT called and needs refill on the below medication. TRULICITY 3 QQ/7.6PP SOPN  PT has enough for 2 weeks. Please forward to the following pharmacy:  [id number] - [place], [address] Phone:  [phone]  Fax:  [phone]

## 2021-04-08 ENCOUNTER — Other Ambulatory Visit: Payer: Self-pay | Admitting: Internal Medicine

## 2021-04-08 DIAGNOSIS — E1142 Type 2 diabetes mellitus with diabetic polyneuropathy: Secondary | ICD-10-CM

## 2021-04-08 MED ORDER — TRULICITY 1.5 MG/0.5ML ~~LOC~~ SOAJ
3.0000 mg | SUBCUTANEOUS | 2 refills | Status: DC
Start: 2021-04-08 — End: 2021-07-26

## 2021-04-08 NOTE — Telephone Encounter (Signed)
Rx sent to preferred pharmacy for 1.5 mg dose.

## 2021-04-08 NOTE — Addendum Note (Signed)
Addended by: Lauralyn Primes on: 04/08/2021 05:04 PM   Modules accepted: Orders

## 2021-04-08 NOTE — Telephone Encounter (Signed)
Patient called back and advised that the 3 MG Trulicity was not what he needed or communicated to [place].  What he needs is an alternative to the 3 MG Trulicity because this medication is out of stock and on back order.  He is requesting an alternative to the 3 MG such as 4-6.5 MG Trulicity - just until the 3 MG is back in stock with the pharmacy.  Conf[facility] [address] [facility] [place]

## 2021-04-19 ENCOUNTER — Ambulatory Visit (INDEPENDENT_AMBULATORY_CARE_PROVIDER_SITE_OTHER): Payer: Medicare Other | Admitting: Internal Medicine

## 2021-04-19 ENCOUNTER — Other Ambulatory Visit: Payer: Self-pay

## 2021-04-19 ENCOUNTER — Encounter: Payer: Self-pay | Admitting: Internal Medicine

## 2021-04-19 VITALS — BP 120/60 | HR 85 | Ht 70.5 in | Wt 219.8 lb

## 2021-04-19 DIAGNOSIS — E1165 Type 2 diabetes mellitus with hyperglycemia: Secondary | ICD-10-CM

## 2021-04-19 DIAGNOSIS — E1142 Type 2 diabetes mellitus with diabetic polyneuropathy: Secondary | ICD-10-CM

## 2021-04-19 LAB — POCT GLYCOSYLATED HEMOGLOBIN (HGB A1C): Hemoglobin A1C: 7.1 % — AB (ref 4.0–5.6)

## 2021-04-19 NOTE — Patient Instructions (Addendum)
Please continue: - Metformin 2000 mg with dinner - Glipizide XL 10 mg before breakfast and 5 mg before dinner - Trulicity 3 mg weekly - Tresiba 13 units daily   Please return in 4 months with your sugar log.

## 2021-04-19 NOTE — Progress Notes (Signed)
Patient ID: Lucas [name], male   DOB: 05-31-1940, 81 y.o.   MRN: [id number]  This visit occurred during the SARS-CoV-2 public health emergency.  Safety protocols were in place, including screening questions prior to the visit, additional usage of staff PPE, and extensive cleaning of exam room while observing appropriate contact time as indicated for disinfecting solutions.   HPI: Lucas [name] is a 81 y.o.-year-old male, presenting for f/u for DM2, dx in 1998, insulin-dependent (previous Agent Orange exposure in [place]), uncontrolled, with complications (mild CKD, foot ulcer). Last visit 4 months ago.  Interim hx: No increased urination, blurry vision, nausea, chest pain.   He still has constipation-on MiraLAX.  He noticed that when he increased activity his constipation improved.  Reviewed HbA1c levels: Lab Results  Component Value Date   HGBA1C 6.9 (A) 12/14/2020   HGBA1C 6.7 (A) 07/05/2020   HGBA1C 7.8 (A) 03/01/2020   HGBA1C 7.8 (A) 10/27/2019   HGBA1C 8.2 (A) 07/21/2019   HGBA1C 7.6 (A) 03/17/2019   HGBA1C 8.1 (H) 09/24/2018   HGBA1C 7.9 (A) 03/18/2018   HGBA1C 7.6 (A) 12/07/2017   HGBA1C 7.8 08/06/2017   HGBA1C 7.4 05/10/2017   HGBA1C 8.3 11/29/2016   HGBA1C 7.7 05/08/2016   HGBA1C 7.7 12/17/2015   HGBA1C 8.8 (H) 07/28/2015   HGBA1C 7.2 02/05/2015   HGBA1C 6.7 11/05/2014   HGBA1C 9.1 (A) 06/29/2014   HGBA1C 8.7 (H) 01/13/2014   HGBA1C 7.9 (H) 07/14/2013   HGBA1C 7.6 (H) 12/31/2012   HGBA1C 7.8 (H) 07/02/2012   HGBA1C 8.4 (H) 02/27/2012   HGBA1C 7.9 (H) 12/06/2011   HGBA1C 8.1 (H) 06/06/2011   Pt is on a regimen of: - Metformin 2000 mg with dinner - Glipizide XL 10 mg before breakfast and 5 mg before dinner - Trulicity 1.5 >> 3 mg weekly - Tresiba 12 - started 02/2020 >> 20 >> 12 >> 12-14 >> 13 units daily  He had to stop [place] as K increased (took 1 mo, off for 1.5 mo). Invokana was not covered.  Pt checks his sugars 1-2 times a day - am:  79, 89-148 >> 65,  75-152, 163, 181 (missed insulin) >> 65, 100-157, 177 - 2h after b'fast: 312 >> n/c >> 220 >> n/c >> 233 >> n/c - before lunch: 67 >> 84, 88-171, 212 >> n/c >> 101-118 >> n/c - 2h after lunch: 114-178 >> n/c >> 154 >> n/c >> 114-137 >> n/c - before dinner: 116-165, 172 >> n/c >> 95-123 >> n/c >> 85 >> n/c - 2h after dinner:n/c  >> 160 >> 167, 172 >> 160 >> n/c - bedtime:  109-130 >> n/c >> 137 >> n/c - nighttime: n/c >> 165 >> n/c >> 117 >> n/c Lowest sugar was 73 >> 79 >> 65 >> 65; he has hypoglycemia awareness in the 70s. Highest sugar was 281  >> 200 x1 >> 181 >> 177  Glucometer: Precision >> One Touch Ultra mini >> Livongo >> BioTel  Pt's meals are: - Breakfast:  Oatmeal, wheat English muffin  - Lunch: salad, 1/2 sandwich - Dinner: meat + veggies, added carbs back - Snacks: 2x a day, fruit bar, fresh fruit - snacking after dinner  + Mild CKD: Lab Results  Component Value Date   BUN 23 09/08/2020   BUN 23 09/08/2020   CREATININE 1.22 09/08/2020   CREATININE 1.22 09/08/2020  04/16/2017: 15/1.2 07/13/2016: BUN/Cr 13/1.0, Glu 116, UACR 0.5 11/10/2015: EGFR 80, creatinine 1.1 04/15/2015: GFR 88.1, Cr 1.06 On lisinopril.  +  HL; last set of lipids: Lab Results  Component Value Date   CHOL 201 (H) 09/08/2020   HDL 35.20 (L) 09/08/2020   LDLCALC 104 07/13/2016   LDLDIRECT 94.0 09/08/2020   TRIG (H) 09/08/2020    467.0 Triglyceride is over 400; calculations on Lipids are invalid.   CHOLHDL 6 09/08/2020  04/16/2016: 179/288/44/77 04/15/2015: LDL 89 On pravastatin 10. On fish oil 1000 mg daily >> increased to 2x a day 12/2020.  - last dilated eye exam was in 2022: No DR, + glaucoma. 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  - + numbness and tingling in his feet.  He is on alpha-lipoic acid by neurology.  He also sees podiatry.  He was in a DM clinical trial before: "Jump Start"  lifestyle Research Trial. He was on the [employer] and lifestyle program in the  past.  He also has a history of HTN, ED. He has gout - prev. on Alopurinol >> then  on Uloric(he had a rash with allopurinol) He had a R foot ulcer 04/2017 and again in 11/2017. He is seen by a vascular dr.  Lynnell Dike were normal then. In 11/2017 he was getting Krysexxa injections for gout.  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  ROS: + see HPI Neurological: no tremors/+ numbness/+ tingling/no dizziness  I reviewed pt's medications, allergies, PMH, social hx, family hx, and changes were documented in the history of present illness. Otherwise, unchanged from my initial visit note.  Past Medical History:  Diagnosis Date   Allergic rhinitis    Diabetes mellitus type II    Diverticulosis of colon    Glaucoma    peripheral vision loss left eye.    History of agent Orange exposure    HLD (hyperlipidemia)    HTN (hypertension)    Hypertrophy of prostate without urinary obstruction and other lower urinary tract symptoms (LUTS)    Osteoarthritis    Past Surgical History:  Procedure Laterality Date   TOOTH EXTRACTION       History   Social History   Marital Status: Married    Spouse Name: N/A   Number of Children: 2   Occupational History   Retired-[employer]    Social History Main Topics   Smoking status: Never Smoker    Smokeless tobacco: Never Used   Alcohol Use: No   Drug Use: No    [place] vet- 20% disability from shrapnel wounds 1967 and 20% agent orange   Married (Widowed 1996; remarried 6/03)   Retired [employer]      Has living will   Wife, then daughter Hoyle Barr will be health care POA   Would want attempts at resuscitation   Would not want feeding tube if cognitively unaware   Current Outpatient Medications on File Prior to Visit  Medication Sig Dispense Refill   albuterol (VENTOLIN HFA) 108 (90 Base) MCG/ACT inhaler Inhale 2 puffs into the lungs every 4 (four) hours as needed.     Alpha-Lipoic Acid 600 MG CAPS Take 1 capsule (600 mg total) by  mouth in the morning and at bedtime. 60 capsule 3   aspirin 81 MG tablet Take 81 mg by mouth daily.     brimonidine (ALPHAGAN) 0.2 % ophthalmic solution Place 1 drop into both eyes 2 (two) times daily.      cetirizine (ZYRTEC) 10 MG tablet Take 10 mg by mouth daily.     colchicine 0.6 MG tablet Take  0.6 mg by mouth as needed. Take 2 tablets by mouth at first sign of gout flare followed by 1 tablet after 1 hour. Next day 1 tab;et for 5 days     COMBIGAN 0.2-0.5 % ophthalmic solution      Dulaglutide (TRULICITY) 1.5 WL/7.9GX SOPN Inject 3 mg into the skin once a week. 4 mL 2   Dulaglutide (TRULICITY) 3 QJ/1.9ER SOPN Inject 3 mg into the skin once a week. 6 mL 1   fluticasone (FLONASE) 50 MCG/ACT nasal spray      gabapentin (NEURONTIN) 300 MG capsule TAKE 2 CAPSULES (600 MG TOTAL) BY MOUTH 3 (THREE) TIMES DAILY. 540 capsule 3   glipiZIDE (GLUCOTROL XL) 5 MG 24 hr tablet TAKE 2 TABLETS BY MOUTH IN THE AM AND 1 TABLET BEFORE DINNER 270 tablet 1   glucose blood (ONE TOUCH ULTRA TEST) test strip Use as instructed to check sugar 2 times daily 200 each 5   indomethacin (INDOCIN) 50 MG capsule Take 1 capsule (50 mg total) by mouth 2 (two) times daily with a meal. (Patient taking differently: Take 50 mg by mouth 2 (two) times daily as needed.) 60 capsule 1   insulin degludec (TRESIBA FLEXTOUCH) 200 UNIT/ML FlexTouch Pen Inject 24 Units into the skin daily. 9 mL 3   Insulin Pen Needle 32G X 4 MM MISC Use 1x a day 100 each 3   latanoprost (XALATAN) 0.005 % ophthalmic solution Place 1 drop into both eyes at bedtime.      lisinopril (PRINIVIL,ZESTRIL) 20 MG tablet Take 10 mg by mouth daily.     metFORMIN (GLUCOPHAGE) 1000 MG tablet Take 1,000 mg by mouth 2 (two) times daily with a meal.     Misc Natural Products (GLUCOSAMINE CHONDROITIN ADV PO) Take 2 tablets by mouth 2 (two) times daily.     Multiple Vitamin (MULTIVITAMIN) tablet Take 1 tablet by mouth daily.     mupirocin ointment (BACTROBAN) 2 % Apply to  wound twice a day. 30 g 2   Omega-3 Fatty Acids (FISH OIL) 1200 MG CAPS      pravastatin (PRAVACHOL) 20 MG tablet Take 10 mg by mouth daily.      sildenafil (REVATIO) 20 MG tablet TAKE 3 TO 5 TABLETS BY MOUTH ONCE DAILY AS NEEDED 50 tablet 11   triamcinolone ointment (KENALOG) 0.5 % Apply 1 application topically 2 (two) times daily. 30 g 0   ULORIC 40 MG tablet Take 40 mg by mouth daily.  4   Vitamin D, Cholecalciferol, 1000 UNITS CAPS Take 1 capsule by mouth daily.     No current facility-administered medications on file prior to visit.   No Known Allergies Family History  Problem Relation Age of Onset   Colon cancer Father    Stroke Mother    Heart attack Mother    Coronary artery disease Mother    Hypertension Other        several siblings   Diabetes Other        several siblings   Coronary artery disease Brother    ALS Sister    Dementia Sister    Neuropathy Neg Hx    PE: BP 120/60 (BP Location: Right Arm, Patient Position: Sitting, Cuff Size: Normal)    Pulse 85    Ht 5' 10.5" (1.791 m)    Wt 219 lb 12.8 oz (99.7 kg)    SpO2 96%    BMI 31.09 kg/m   Wt Readings from Last 3 Encounters:  04/19/21 219 lb  12.8 oz (99.7 kg)  12/14/20 220 lb (99.8 kg)  09/08/20 216 lb (98 kg)   Constitutional: overweight, in NAD Eyes: PERRLA, EOMI, no exophthalmos ENT: moist mucous membranes, no thyromegaly, no cervical lymphadenopathy Cardiovascular: RRR, No MRG Respiratory: CTA B Musculoskeletal: no deformities, strength intact in all 4 Skin: moist, warm, no rashes Neurological: no tremor with outstretched hands, DTR normal in all 4  ASSESSMENT: 1. DM2, insulin-dependent, uncontrolled, with complications - R foot ulcer. Nl ABIs - mild CKD  2. HL  3.  Obesity class I  PLAN:  1. Patient with longstanding, uncontrolled, type 2 diabetes, on oral antidiabetic regimen with metformin and sulfonylurea and also weekly GLP-1 receptor agonist and daily long-acting insulin, with improvement  of his diabetes control after adding insulin.  His HbA1c decreased to 6.7%, but at last visit, this was slightly higher, at 6.9%.  At that time, sugars are slightly higher in the morning as he reduced his Tresiba dose from 20units to 12 units.  I advised him to increase this slightly.  Otherwise, we continued the rest of the regimen. -At this visit, he only checks blood sugars in the morning and they are approximately the same as before, which sugars up to the 150s if he is eating a late snack the night before and even up to 170s rarely (now during the holidays).  He also had 1 low blood sugar at 65, unclear why.  I advised him that he also needs to occasionally check later in the day, rotating check times. -His HbA1c is slightly higher today, most likely due to the holidays.  For now, I did not suggest a change in his regimen. - I suggested to:  Patient Instructions  Please continue: - Metformin 2000 mg with dinner - Glipizide XL 10 mg in a.m. and 5 mg before dinner - Trulicity 3 mg weekly - Tresiba 12-14 units daily  Please return in 4 months with your sugar log.   - we checked his HbA1c: 7.1% (slightly higher) - advised to check sugars at different times of the day - 1-2x a day, rotating check times - advised for yearly eye exams >> he is UTD - return to clinic in 4 months  2. HL -Reviewed latest lipid panel from 09/2020: LDL higher than goal, triglycerides high, HDL low: Lab Results  Component Value Date   CHOL 201 (H) 09/08/2020   HDL 35.20 (L) 09/08/2020   LDLCALC 104 07/13/2016   LDLDIRECT 94.0 09/08/2020   TRIG (H) 09/08/2020    467.0 Triglyceride is over 400; calculations on Lipids are invalid.   CHOLHDL 6 09/08/2020  -He continues on pravastatin 10 mg daily without side effects.  At last visit I advised him to increase his omega-3 fatty acids from 1000 mg once a day to twice a day.  Increase the dose since last visit. -He has an appointment with PCP coming up.  3. Obesity  class I -We will continue Trulicity which should also help with weight loss -He gained 10 pounds after starting insulin, cumulative, before the last 2 visits -He lost 1 pound since last visit  Philemon Kingdom, MD PhD [facility]

## 2021-05-03 DIAGNOSIS — M1990 Unspecified osteoarthritis, unspecified site: Secondary | ICD-10-CM | POA: Diagnosis not present

## 2021-05-03 DIAGNOSIS — M1A9XX1 Chronic gout, unspecified, with tophus (tophi): Secondary | ICD-10-CM | POA: Diagnosis not present

## 2021-05-03 DIAGNOSIS — E119 Type 2 diabetes mellitus without complications: Secondary | ICD-10-CM | POA: Diagnosis not present

## 2021-05-03 DIAGNOSIS — M2011 Hallux valgus (acquired), right foot: Secondary | ICD-10-CM | POA: Diagnosis not present

## 2021-05-13 DIAGNOSIS — U071 COVID-19: Secondary | ICD-10-CM | POA: Diagnosis not present

## 2021-05-13 DIAGNOSIS — J029 Acute pharyngitis, unspecified: Secondary | ICD-10-CM | POA: Diagnosis not present

## 2021-05-13 DIAGNOSIS — R5383 Other fatigue: Secondary | ICD-10-CM | POA: Diagnosis not present

## 2021-05-23 ENCOUNTER — Other Ambulatory Visit: Payer: Self-pay | Admitting: Internal Medicine

## 2021-06-02 DIAGNOSIS — U071 COVID-19: Secondary | ICD-10-CM | POA: Diagnosis not present

## 2021-06-02 DIAGNOSIS — M1A9XX1 Chronic gout, unspecified, with tophus (tophi): Secondary | ICD-10-CM | POA: Diagnosis not present

## 2021-06-10 ENCOUNTER — Telehealth: Payer: Self-pay | Admitting: Internal Medicine

## 2021-06-10 NOTE — Telephone Encounter (Signed)
Patient called to inform us that Dr. Deno Etienne office would be sending over potassium results  ?

## 2021-06-18 DIAGNOSIS — M25362 Other instability, left knee: Secondary | ICD-10-CM | POA: Diagnosis not present

## 2021-06-20 ENCOUNTER — Ambulatory Visit (INDEPENDENT_AMBULATORY_CARE_PROVIDER_SITE_OTHER): Payer: Medicare Other | Admitting: Family Medicine

## 2021-06-20 ENCOUNTER — Encounter: Payer: Self-pay | Admitting: Family Medicine

## 2021-06-20 ENCOUNTER — Other Ambulatory Visit: Payer: Self-pay

## 2021-06-20 VITALS — BP 122/66 | HR 78 | Temp 97.5°F | Ht 70.5 in | Wt 219.0 lb

## 2021-06-20 DIAGNOSIS — R296 Repeated falls: Secondary | ICD-10-CM

## 2021-06-20 DIAGNOSIS — M1712 Unilateral primary osteoarthritis, left knee: Secondary | ICD-10-CM

## 2021-06-20 DIAGNOSIS — M25562 Pain in left knee: Secondary | ICD-10-CM | POA: Diagnosis not present

## 2021-06-20 NOTE — Progress Notes (Signed)
? ? ?Lucas Batta T. Lucas Goodell, MD, [facility] ?Therapist, music at [facility] ?Lucas [name] ?[name], [id number] ? ?Phone: [phone]  FAX: [phone] ? ?Lucas [name] - 81 y.o. male  MRN [id number]  Date of Birth: July 28, 1940 ? ?Date: 06/20/2021  PCP: Lucas Carbon, MD  Referral: Lucas Carbon, MD ? ?Chief Complaint  ?Patient presents with  ? Knee Pain  ?  C/o L knee pain.  H/o pain. Pt fell a couple of times last wk.  States knee "gives out".  ? ? ?This visit occurred during the SARS-CoV-2 public health emergency.  Safety protocols were in place, including screening questions prior to the visit, additional usage of staff PPE, and extensive cleaning of exam room while observing appropriate contact time as indicated for disinfecting solutions.  ? ?Subjective:  ? ?Lucas [name] is a 81 y.o. very pleasant male patient with Body mass index is 30.98 kg/m?. who presents with the following: ? ?Ongoing L knee pain with recent falls: ? ?[place] [address] 3 times in the last month.  He does have some symptomatic giving way of the left knee and feels as if it is going to buckle.  He has fallen twice in the last week.  He has pain with rising from a seated position, going up and down stairs, and getting out of a car.  While seated, he is not really having much pain, and seated on the examination table today, he does not have any significant pain. ? ?He does also have some extensive neuropathy from diabetes, and his sensation in his lower extremities is decreased some.  Does have some additional wear pattern on the left forefoot on his shoes.  He did not bring those in today, but he and his wife do tell me this. ? ?He does work out with a Physiological scientist 2 times a week. ? ?Right now is not having any significant swelling. ?He did go to [facility] over the weekend, and I did review their films today in the office.  He does have some moderate patellofemoral osteoarthritis, but globally joint  spaces are very well-preserved for 81 years old. ? ?Review of Systems is noted in the HPI, as appropriate ? ?Objective:  ? ?BP 122/66   Pulse 78   Temp (!) 97.5 ?F (36.4 ?C) (Temporal)   Ht 5' 10.5" (1.791 m)   Wt 219 lb (99.3 kg)   SpO2 100%   BMI 30.98 kg/m?  ? ?GEN: No acute distress; alert,appropriate. ?PULM: Breathing comfortably in no respiratory distress ?PSYCH: Normally interactive.  ? ? ?Knee:  L ?Gait: Normal heel toe pattern, pain when rising from a seated position ?ROM: 0-1 25. ?Effusion: Minimal to mild ?Echymosis or edema: none ?Patellar tendon NT ?Painful PLICA: neg ?Patellar grind: negative ?Medial and lateral patellar facet loading: negative ?medial and lateral joint lines: Mild medial joint line tenderness ?Mcmurray's neg ?Flexion-pinch neg ?Varus and valgus stress: stable ?Lachman: neg ?Ant and Post drawer: neg ?Hip abduction, IR, ER: WNL ?Hip flexion str: 4/5 ?Hip abd: 5/5 ?Quad: 4/5 ?VMO atrophy: Bilateral ?Hamstring concentric and eccentric: 5/5  ? ?Laboratory and Imaging Data: ? ?Assessment and Plan:  ? ?  ICD-10-CM   ?1. Acute pain of left knee  M25.562 Ambulatory referral to Physical Therapy  ?  ?2. Falling  R29.6 Ambulatory referral to Physical Therapy  ?  ?3. Primary osteoarthritis of left knee  M17.12   ?  ? ?He has had some more acute knee pain in  the setting of some intermittent chronic knee pain.  Does have some mild patellofemoral arthritis, but globally his joint spaces are fairly well-preserved. ? ?I think the multiple falls over the biggest concern here.  I would like for him to go to physical therapy for strengthening and gait training. ? ?Social: This is limiting his ability to walk and have some pain with movement ? ?No orders of the defined types were placed in this encounter. ? ?There are no discontinued medications. ?Orders Placed This Encounter  ?Procedures  ? Ambulatory referral to Physical Therapy  ? ? ?Follow-up: Return in about 5 weeks (around 07/25/2021) for Dr.  Lorelei Pont knee recheck. ? ?Dragon [employer] One speech-to-text software was used for transcription in this dictation.  Possible transcriptional errors can occur using Editor, commissioning.  ? ?Signed, ? ?Trysten Berti T. Nerine Pulse, MD ? ? ?Outpatient Encounter Medications as of 06/20/2021  ?Medication Sig  ? albuterol (VENTOLIN HFA) 108 (90 Base) MCG/ACT inhaler Inhale 2 puffs into the lungs every 4 (four) hours as needed.  ? Alpha-Lipoic Acid 600 MG CAPS Take 1 capsule (600 mg total) by mouth in the morning and at bedtime.  ? aspirin 81 MG tablet Take 81 mg by mouth daily.  ? BD PEN NEEDLE NANO 2ND GEN 32G X 4 MM MISC USE ONCE A DAY  ? brimonidine (ALPHAGAN) 0.2 % ophthalmic solution Place 1 drop into both eyes 2 (two) times daily.   ? cetirizine (ZYRTEC) 10 MG tablet Take 10 mg by mouth daily.  ? colchicine 0.6 MG tablet Take 0.6 mg by mouth as needed. Take 2 tablets by mouth at first sign of gout flare followed by 1 tablet after 1 hour. Next day 1 tab;et for 5 days  ? COMBIGAN 0.2-0.5 % ophthalmic solution   ? Dulaglutide (TRULICITY) 1.5 VE/7.2CN SOPN Inject 3 mg into the skin once a week.  ? Dulaglutide (TRULICITY) 3 OB/0.9GG SOPN Inject 3 mg into the skin once a week.  ? fluticasone (FLONASE) 50 MCG/ACT nasal spray   ? gabapentin (NEURONTIN) 300 MG capsule TAKE 2 CAPSULES (600 MG TOTAL) BY MOUTH 3 (THREE) TIMES DAILY.  ? glipiZIDE (GLUCOTROL XL) 5 MG 24 hr tablet TAKE 2 TABLETS BY MOUTH IN THE AM AND 1 TABLET BEFORE DINNER  ? glucose blood (ONE TOUCH ULTRA TEST) test strip Use as instructed to check sugar 2 times daily  ? indomethacin (INDOCIN) 50 MG capsule Take 1 capsule (50 mg total) by mouth 2 (two) times daily with a meal. (Patient taking differently: Take 50 mg by mouth 2 (two) times daily as needed.)  ? insulin degludec (TRESIBA FLEXTOUCH) 200 UNIT/ML FlexTouch Pen Inject 24 Units into the skin daily.  ? latanoprost (XALATAN) 0.005 % ophthalmic solution Place 1 drop into both eyes at bedtime.   ? lisinopril  (PRINIVIL,ZESTRIL) 20 MG tablet Take 10 mg by mouth daily.  ? metFORMIN (GLUCOPHAGE) 1000 MG tablet Take 1,000 mg by mouth 2 (two) times daily with a meal.  ? Misc Natural Products (GLUCOSAMINE CHONDROITIN ADV PO) Take 2 tablets by mouth 2 (two) times daily.  ? Multiple Vitamin (MULTIVITAMIN) tablet Take 1 tablet by mouth daily.  ? mupirocin ointment (BACTROBAN) 2 % Apply to wound twice a day.  ? Omega-3 Fatty Acids (FISH OIL) 1200 MG CAPS   ? pravastatin (PRAVACHOL) 20 MG tablet Take 10 mg by mouth daily.   ? sildenafil (REVATIO) 20 MG tablet TAKE 3 TO 5 TABLETS BY MOUTH ONCE DAILY AS NEEDED  ? triamcinolone ointment (KENALOG)  0.5 % Apply 1 application topically 2 (two) times daily.  ? ULORIC 40 MG tablet Take 40 mg by mouth daily.  ? Vitamin D, Cholecalciferol, 1000 UNITS CAPS Take 1 capsule by mouth daily.  ? ?No facility-administered encounter medications on file as of 06/20/2021.  ?  ?

## 2021-07-21 DIAGNOSIS — H401233 Low-tension glaucoma, bilateral, severe stage: Secondary | ICD-10-CM | POA: Diagnosis not present

## 2021-07-21 LAB — HM DIABETES EYE EXAM

## 2021-07-26 ENCOUNTER — Encounter: Payer: Self-pay | Admitting: Internal Medicine

## 2021-07-26 ENCOUNTER — Ambulatory Visit (INDEPENDENT_AMBULATORY_CARE_PROVIDER_SITE_OTHER): Payer: Medicare Other | Admitting: Internal Medicine

## 2021-07-26 DIAGNOSIS — R079 Chest pain, unspecified: Secondary | ICD-10-CM | POA: Insufficient documentation

## 2021-07-26 NOTE — Progress Notes (Signed)
? ?Subjective:  ? ? Patient ID: Lucas [name], male    DOB: Sep 13, 1940, 81 y.o.   MRN: [id number] ? ?HPI ?Here due to left breast and arm pain ? ?Having a morning discomfort along left breast and chest---some days ?Started 3-4 months ago ?Awakens with it---will feel it in bed ?Has to be careful moving left arm around ?Does go away after up for a while and moving about ?Notices left arm weakness compared to right---when lifting ? ?No clear relationship with activity or eating ?Doesn't do yard work---does go to the gym twice a week (Pilates and weights)---unclear if he notes it the next day ? ?Current Outpatient Medications on File Prior to Visit  ?Medication Sig Dispense Refill  ? albuterol (VENTOLIN HFA) 108 (90 Base) MCG/ACT inhaler Inhale 2 puffs into the lungs every 4 (four) hours as needed.    ? Alpha-Lipoic Acid 600 MG CAPS Take 1 capsule (600 mg total) by mouth in the morning and at bedtime. 60 capsule 3  ? aspirin 81 MG tablet Take 81 mg by mouth daily.    ? BD PEN NEEDLE NANO 2ND GEN 32G X 4 MM MISC USE ONCE A DAY 100 each 3  ? brimonidine (ALPHAGAN) 0.2 % ophthalmic solution Place 1 drop into both eyes 2 (two) times daily.     ? cetirizine (ZYRTEC) 10 MG tablet Take 10 mg by mouth daily.    ? colchicine 0.6 MG tablet Take 0.6 mg by mouth as needed. Take 2 tablets by mouth at first sign of gout flare followed by 1 tablet after 1 hour. Next day 1 tab;et for 5 days    ? COMBIGAN 0.2-0.5 % ophthalmic solution     ? Dulaglutide (TRULICITY) 3 XL/2.4MW SOPN Inject 3 mg into the skin once a week. 6 mL 1  ? fluticasone (FLONASE) 50 MCG/ACT nasal spray     ? gabapentin (NEURONTIN) 300 MG capsule TAKE 2 CAPSULES (600 MG TOTAL) BY MOUTH 3 (THREE) TIMES DAILY. 540 capsule 3  ? glipiZIDE (GLUCOTROL XL) 5 MG 24 hr tablet TAKE 2 TABLETS BY MOUTH IN THE AM AND 1 TABLET BEFORE DINNER 270 tablet 1  ? glucose blood (ONE TOUCH ULTRA TEST) test strip Use as instructed to check sugar 2 times daily 200 each 5  ? indomethacin  (INDOCIN) 50 MG capsule Take 1 capsule (50 mg total) by mouth 2 (two) times daily with a meal. (Patient taking differently: Take 50 mg by mouth 2 (two) times daily as needed.) 60 capsule 1  ? insulin degludec (TRESIBA FLEXTOUCH) 200 UNIT/ML FlexTouch Pen Inject 24 Units into the skin daily. 9 mL 3  ? latanoprost (XALATAN) 0.005 % ophthalmic solution Place 1 drop into both eyes at bedtime.     ? lisinopril (PRINIVIL,ZESTRIL) 20 MG tablet Take 10 mg by mouth daily.    ? metFORMIN (GLUCOPHAGE) 1000 MG tablet Take 1,000 mg by mouth 2 (two) times daily with a meal.    ? Misc Natural Products (GLUCOSAMINE CHONDROITIN ADV PO) Take 2 tablets by mouth 2 (two) times daily.    ? Multiple Vitamin (MULTIVITAMIN) tablet Take 1 tablet by mouth daily.    ? mupirocin ointment (BACTROBAN) 2 % Apply to wound twice a day. 30 g 2  ? Omega-3 Fatty Acids (FISH OIL) 1200 MG CAPS     ? pravastatin (PRAVACHOL) 20 MG tablet Take 10 mg by mouth daily.     ? sildenafil (REVATIO) 20 MG tablet TAKE 3 TO 5 TABLETS BY MOUTH  ONCE DAILY AS NEEDED 50 tablet 11  ? triamcinolone ointment (KENALOG) 0.5 % Apply 1 application topically 2 (two) times daily. 30 g 0  ? ULORIC 40 MG tablet Take 40 mg by mouth daily.  4  ? Vitamin D, Cholecalciferol, 1000 UNITS CAPS Take 1 capsule by mouth daily.    ? ?No current facility-administered medications on file prior to visit.  ? ? ?No Known Allergies ? ?Past Medical History:  ?Diagnosis Date  ? Allergic rhinitis   ? Diabetes mellitus type II   ? Diverticulosis of colon   ? Glaucoma   ? peripheral vision loss left eye.   ? History of agent Orange exposure   ? HLD (hyperlipidemia)   ? HTN (hypertension)   ? Hypertrophy of prostate without urinary obstruction and other lower urinary tract symptoms (LUTS)   ? Osteoarthritis   ? ? ?Past Surgical History:  ?Procedure Laterality Date  ? TOOTH EXTRACTION    ? ? ?Family History  ?Problem Relation Age of Onset  ? Colon cancer Father   ? Stroke Mother   ? Heart attack Mother    ? Coronary artery disease Mother   ? Hypertension Other   ?     several siblings  ? Diabetes Other   ?     several siblings  ? Coronary artery disease Brother   ? ALS Sister   ? Dementia Sister   ? Neuropathy Neg Hx   ? ? ?Social History  ? ?Socioeconomic History  ? Marital status: Married  ?  Spouse name: Not on file  ? Number of children: Not on file  ? Years of education: Not on file  ? Highest education level: Not on file  ?Occupational History  ? Occupation: Retired-[employer]  ?Tobacco Use  ? Smoking status: Never  ? Smokeless tobacco: Never  ?Vaping Use  ? Vaping Use: Never used  ?Substance and Sexual Activity  ? Alcohol use: No  ?  Alcohol/week: 0.0 standard drinks  ? Drug use: No  ? Sexual activity: Not on file  ?Other Topics Concern  ? Not on file  ?Social History Narrative  ? [place] vet- 20% disability from shrapnel wounds 1967 and 20% agent orange  ? Married (Widowed 1996; remarried 6/03)  ? Retired [employer]  ?   ? Has living will  ? Wife, then daughter Hoyle Barr will be health care POA  ? Would want attempts at resuscitation  ? Would not want feeding tube if cognitively unaware  ? ?Social Determinants of Health  ? ?Financial Resource Strain: Not on file  ?Food Insecurity: Not on file  ?Transportation Needs: Not on file  ?Physical Activity: Not on file  ?Stress: Not on file  ?Social Connections: Not on file  ?Intimate Partner Violence: Not on file  ? ?Review of Systems ?No exertional symptoms ?No change in exercise tolerance ?Doesn't use any pain medications ?Fell twice a while ago--when walking without stick. Now wearing brace on left knee ? ?   ?Objective:  ? Physical Exam ?Cardiovascular:  ?   Rate and Rhythm: Normal rate and regular rhythm.  ?   Heart sounds: No murmur heard. ?  No gallop.  ?Pulmonary:  ?   Effort: Pulmonary effort is normal.  ?   Breath sounds: Normal breath sounds. No wheezing or rales.  ?   Comments: No pectoral tenderness ?Musculoskeletal:  ?   Comments: Fairly normal ROM in both  shoulders ?Slight clunk on left ?No bursa or shoulder tenderness  ?Neurological:  ?  Comments: No left arm weakness  ?  ? ? ? ? ?   ?Assessment & Plan:  ? ?

## 2021-07-26 NOTE — Assessment & Plan Note (Addendum)
Reassured--definitely doesn't seem cardiac or pulmonary ??related to shoulder ?Always awakens with it--then it goes away ? ?Discussed noting if it is related to anything he does the day before (like the gym) ?Trial tylenol '1000mg'$  at bedtime ? ?Also consider, over the counter diclofenac gel to left shoulder at bedtime ?

## 2021-07-27 ENCOUNTER — Encounter: Payer: Self-pay | Admitting: Podiatry

## 2021-07-27 ENCOUNTER — Ambulatory Visit (INDEPENDENT_AMBULATORY_CARE_PROVIDER_SITE_OTHER): Payer: Medicare Other | Admitting: Podiatry

## 2021-07-27 DIAGNOSIS — B351 Tinea unguium: Secondary | ICD-10-CM

## 2021-07-27 DIAGNOSIS — M79676 Pain in unspecified toe(s): Secondary | ICD-10-CM | POA: Diagnosis not present

## 2021-07-27 DIAGNOSIS — R2681 Unsteadiness on feet: Secondary | ICD-10-CM | POA: Diagnosis not present

## 2021-07-27 DIAGNOSIS — R2689 Other abnormalities of gait and mobility: Secondary | ICD-10-CM | POA: Diagnosis not present

## 2021-07-27 DIAGNOSIS — E1142 Type 2 diabetes mellitus with diabetic polyneuropathy: Secondary | ICD-10-CM

## 2021-07-27 NOTE — Progress Notes (Signed)
He presents today chief complaint of painful elongated toenails. ? ?Objective: Vital signs are stable alert oriented x3 there is no erythema edema cellulitis drainage or odor nails are long thick yellow dystrophic-like mycotic. ? ?Assessment: Pain in limb secondary to diabetic peripheral neuropathy and type 2 diabetes with elongated toenails. ? ?Plan: Debridement of nails 1 through 5 bilaterally.  Follow-up with him in 3 months ?

## 2021-08-01 DIAGNOSIS — Z20822 Contact with and (suspected) exposure to covid-19: Secondary | ICD-10-CM | POA: Diagnosis not present

## 2021-08-05 DIAGNOSIS — Z20822 Contact with and (suspected) exposure to covid-19: Secondary | ICD-10-CM | POA: Diagnosis not present

## 2021-08-17 ENCOUNTER — Encounter: Payer: Self-pay | Admitting: Internal Medicine

## 2021-08-17 ENCOUNTER — Ambulatory Visit (INDEPENDENT_AMBULATORY_CARE_PROVIDER_SITE_OTHER): Payer: Medicare Other | Admitting: Internal Medicine

## 2021-08-17 VITALS — BP 118/72 | HR 79 | Ht 70.5 in | Wt 218.6 lb

## 2021-08-17 DIAGNOSIS — E669 Obesity, unspecified: Secondary | ICD-10-CM | POA: Diagnosis not present

## 2021-08-17 DIAGNOSIS — E785 Hyperlipidemia, unspecified: Secondary | ICD-10-CM

## 2021-08-17 DIAGNOSIS — E1142 Type 2 diabetes mellitus with diabetic polyneuropathy: Secondary | ICD-10-CM

## 2021-08-17 DIAGNOSIS — E1165 Type 2 diabetes mellitus with hyperglycemia: Secondary | ICD-10-CM

## 2021-08-17 LAB — POCT GLYCOSYLATED HEMOGLOBIN (HGB A1C): Hemoglobin A1C: 6.9 % — AB (ref 4.0–5.6)

## 2021-08-17 MED ORDER — TRESIBA FLEXTOUCH 200 UNIT/ML ~~LOC~~ SOPN: 12.0000 [IU] | PEN_INJECTOR | Freq: Every day | SUBCUTANEOUS | 3 refills | Status: DC

## 2021-08-17 MED ORDER — TRULICITY 3 MG/0.5ML ~~LOC~~ SOAJ: 3.0000 mg | SUBCUTANEOUS | 3 refills | Status: DC

## 2021-08-17 MED ORDER — GLIPIZIDE ER 5 MG PO TB24: ORAL_TABLET | ORAL | 3 refills | Status: DC

## 2021-08-17 NOTE — Progress Notes (Signed)
Patient ID: Lucas [name], male   DOB: [birth year], 81 y.o.   MRN: [id number] ? ?This visit occurred during the SARS-CoV-2 public health emergency.  Safety protocols were in place, including screening questions prior to the visit, additional usage of staff PPE, and extensive cleaning of exam room while observing appropriate contact time as indicated for disinfecting solutions.  ? ?HPI: ?Lucas [name] is a 80 y.o.-year-old male, presenting for f/u for DM2, dx in 1998, insulin-dependent (previous Agent Orange exposure in [place]), uncontrolled, with complications (mild CKD, foot ulcer). Last visit 4 months ago. ? ?Interim hx: ?No increased urination, blurry vision, nausea, chest pain.  He does have knee pain (wears a brace) and walks with a cane. ?He still has constipation-on MiraLAX.  He noticed that when he increased activity his constipation improved. ? ?Reviewed HbA1c levels: ?Lab Results  ?Component Value Date  ? HGBA1C 7.1 (A) 04/19/2021  ? HGBA1C 6.9 (A) 12/14/2020  ? HGBA1C 6.7 (A) 07/05/2020  ? HGBA1C 7.8 (A) 03/01/2020  ? HGBA1C 7.8 (A) 10/27/2019  ? HGBA1C 8.2 (A) 07/21/2019  ? HGBA1C 7.6 (A) 03/17/2019  ? HGBA1C 8.1 (H) 09/24/2018  ? HGBA1C 7.9 (A) 03/18/2018  ? HGBA1C 7.6 (A) 12/07/2017  ? HGBA1C 7.8 08/06/2017  ? HGBA1C 7.4 05/10/2017  ? HGBA1C 8.3 11/29/2016  ? HGBA1C 7.7 05/08/2016  ? HGBA1C 7.7 12/17/2015  ? HGBA1C 8.8 (H) 07/28/2015  ? HGBA1C 7.2 02/05/2015  ? HGBA1C 6.7 11/05/2014  ? HGBA1C 9.1 (A) 06/29/2014  ? HGBA1C 8.7 (H) 01/13/2014  ? HGBA1C 7.9 (H) 07/14/2013  ? HGBA1C 7.6 (H) 12/31/2012  ? HGBA1C 7.8 (H) 07/02/2012  ? HGBA1C 8.4 (H) 02/27/2012  ? HGBA1C 7.9 (H) 12/06/2011  ? ?Pt is on a regimen of: ?- Metformin 2000 mg with dinner ?- Glipizide XL 10 mg before breakfast and 5 mg before dinner ?- Trulicity 1.5 >> 3 mg weekly ?- Tresiba 12 - started 02/2020 >> 20 >> 12 >> 12-14 >> 12.5 units daily  ?He had to stop [place] as K increased (took 1 mo, off for 1.5 mo). [employer] was not  covered. ? ?Pt checks his sugars 1-2 times a day ?- am:  79, 89-148 >> 65, 75-152, 163, 181 >> 65, 100-157, 177 >> 71-131, 183 ?- 2h after b'fast: 312 >> n/c >> 220 >> n/c >> 233 >> n/c >> 292 ?- before lunch: 67 >> 84, 88-171, 212 >> n/c >> 101-118 >> n/c >> 151 ?- 2h after lunch: 114-178 >> n/c >> 154 >> n/c >> 114-137 >> n/c ?- before dinner: 116-165, 172 >> n/c >> 95-123 >> n/c >> 85 >> n/c ?- 2h after dinner:n/c  >> 160 >> 167, 172 >> 160 >> n/c ?- bedtime:  109-130 >> n/c >> 137 >> n/c ?- nighttime: n/c >> 165 >> n/c >> 117 >> n/c ?Lowest sugar was 73 >> 79 >> 65 >> 65 >> 71; he has hypoglycemia awareness in the 70s. ?Highest sugar was 281  >> 200 x1 >> 181 >> 177 >> 292. ? ?Glucometer: Precision >> One Touch Ultra mini >> [place] >> BioTel ? ?Pt's meals are: ?- Breakfast:  Oatmeal, wheat English muffin  ?- Lunch: salad, 1/2 sandwich ?- Dinner: meat + veggies, added carbs back ?- Snacks: 2x a day, fruit bar, fresh fruit - snacking after dinner ? ?+ Mild CKD: ?Lab Results  ?Component Value Date  ? BUN 23 09/08/2020  ? BUN 23 09/08/2020  ? CREATININE 1.22 09/08/2020  ? CREATININE 1.22 09/08/2020  ?  04/16/2017: 15/1.2 ?07/13/2016: BUN/Cr 13/1.0, Glu 116, UACR 0.5 ?11/10/2015: EGFR 80, creatinine 1.1 ?04/15/2015: GFR 88.1, Cr 1.06 ?On lisinopril. ? ?+ HL; last set of lipids: ?Lab Results  ?Component Value Date  ? CHOL 201 (H) 09/08/2020  ? HDL 35.20 (L) 09/08/2020  ? [place] 104 07/13/2016  ? LDLDIRECT 94.0 09/08/2020  ? TRIG (H) 09/08/2020  ?  467.0 Triglyceride is over 400; calculations on Lipids are invalid.  ? CHOLHDL 6 09/08/2020  ?04/16/2016: 179/288/44/77 ?04/15/2015: LDL 89 ?On pravastatin 10. On fish oil 1000 mg daily >> increased to 2x a day 12/2020. ? ?- last dilated eye exam was in 07/2021: No DR, + glaucoma. 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 ? ?- + numbness and tingling in his feet.  He is on alpha-lipoic acid by neurology.  He also sees podiatry.  Latest foot exam September 08, 2020. ? ?He  was in a DM clinical trial before: "Jump Start"  lifestyle Research Trial. ?He was on the [employer] and lifestyle program in the past. ? ?He also has a history of HTN, ED. He has gout - prev. on Alopurinol >> then  on Uloric(he had a rash with allopurinol) ?He had a R foot ulcer 04/2017 and again in 11/2017. He is seen by a vascular dr.  Lynnell Dike were normal then. ?In 11/2017 he was getting Krysexxa injections for gout.  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 ? ?ROS: ?+ see HPI ?Neurological: no tremors/+ numbness/+ tingling/no dizziness ? ?I reviewed pt's medications, allergies, PMH, social hx, family hx, and changes were documented in the history of present illness. Otherwise, unchanged from my initial visit note. ? ?Past Medical History:  ?Diagnosis Date  ? Allergic rhinitis   ? Diabetes mellitus type II   ? Diverticulosis of colon   ? Glaucoma   ? peripheral vision loss left eye.   ? History of agent Orange exposure   ? HLD (hyperlipidemia)   ? HTN (hypertension)   ? Hypertrophy of prostate without urinary obstruction and other lower urinary tract symptoms (LUTS)   ? Osteoarthritis   ? ?Past Surgical History:  ?Procedure Laterality Date  ? TOOTH EXTRACTION    ?  ? ?History  ? ?Social History  ? Marital Status: Married  ?  Spouse Name: N/A  ? Number of Children: 2  ? ?Occupational History  ? Retired-[employer]   ? ?Social History Main Topics  ? Smoking status: Never Smoker   ? Smokeless tobacco: Never Used  ? Alcohol Use: No  ? Drug Use: No  ? ? [place] vet- 20% disability from shrapnel wounds 1967 and 20% agent orange  ? Married (Widowed 1996; remarried 6/03)  ? Retired [employer]  ?   ? Has living will  ? Wife, then daughter Hoyle Barr will be health care POA  ? Would want attempts at resuscitation  ? Would not want feeding tube if cognitively unaware  ? ?Current Outpatient Medications on File Prior to Visit  ?Medication Sig Dispense Refill  ? albuterol (VENTOLIN HFA) 108 (90 Base) MCG/ACT  inhaler Inhale 2 puffs into the lungs every 4 (four) hours as needed.    ? Alpha-Lipoic Acid 600 MG CAPS Take 1 capsule (600 mg total) by mouth in the morning and at bedtime. 60 capsule 3  ? aspirin 81 MG tablet Take 81 mg by mouth daily.    ? BD PEN NEEDLE NANO 2ND GEN 32G X 4 MM MISC  USE ONCE A DAY 100 each 3  ? brimonidine (ALPHAGAN) 0.2 % ophthalmic solution Place 1 drop into both eyes 2 (two) times daily.     ? cetirizine (ZYRTEC) 10 MG tablet Take 10 mg by mouth daily.    ? colchicine 0.6 MG tablet Take 0.6 mg by mouth as needed. Take 2 tablets by mouth at first sign of gout flare followed by 1 tablet after 1 hour. Next day 1 tab;et for 5 days    ? COMBIGAN 0.2-0.5 % ophthalmic solution     ? Dulaglutide (TRULICITY) 3 HU/8.3FG SOPN Inject 3 mg into the skin once a week. 6 mL 1  ? fluticasone (FLONASE) 50 MCG/ACT nasal spray     ? gabapentin (NEURONTIN) 300 MG capsule TAKE 2 CAPSULES (600 MG TOTAL) BY MOUTH 3 (THREE) TIMES DAILY. 540 capsule 3  ? glipiZIDE (GLUCOTROL XL) 5 MG 24 hr tablet TAKE 2 TABLETS BY MOUTH IN THE AM AND 1 TABLET BEFORE DINNER 270 tablet 1  ? glucose blood (ONE TOUCH ULTRA TEST) test strip Use as instructed to check sugar 2 times daily 200 each 5  ? indomethacin (INDOCIN) 50 MG capsule Take 1 capsule (50 mg total) by mouth 2 (two) times daily with a meal. (Patient taking differently: Take 50 mg by mouth 2 (two) times daily as needed.) 60 capsule 1  ? insulin degludec (TRESIBA FLEXTOUCH) 200 UNIT/ML FlexTouch Pen Inject 24 Units into the skin daily. 9 mL 3  ? latanoprost (XALATAN) 0.005 % ophthalmic solution Place 1 drop into both eyes at bedtime.     ? lisinopril (PRINIVIL,ZESTRIL) 20 MG tablet Take 10 mg by mouth daily.    ? metFORMIN (GLUCOPHAGE) 1000 MG tablet Take 1,000 mg by mouth 2 (two) times daily with a meal.    ? Misc Natural Products (GLUCOSAMINE CHONDROITIN ADV PO) Take 2 tablets by mouth 2 (two) times daily.    ? Multiple Vitamin (MULTIVITAMIN) tablet Take 1 tablet by mouth  daily.    ? mupirocin ointment (BACTROBAN) 2 % Apply to wound twice a day. 30 g 2  ? Omega-3 Fatty Acids (FISH OIL) 1200 MG CAPS     ? pravastatin (PRAVACHOL) 20 MG tablet Take 10 mg by mouth daily.     ? sildenafil (REVA

## 2021-08-17 NOTE — Patient Instructions (Addendum)
Please continue: - Metformin 2000 mg with dinner - Glipizide XL 10 mg in a.m. and 5 mg before dinner - Trulicity 3 mg weekly - Tresiba 12.5 units daily  Check some sugars later in the day, including at bedtime.  Please return in 4 months with your sugar log.  

## 2021-09-06 ENCOUNTER — Other Ambulatory Visit: Payer: Medicare Other

## 2021-09-12 ENCOUNTER — Encounter: Payer: Medicare Other | Admitting: Internal Medicine

## 2021-09-21 ENCOUNTER — Encounter: Payer: Self-pay | Admitting: Internal Medicine

## 2021-09-21 ENCOUNTER — Ambulatory Visit (INDEPENDENT_AMBULATORY_CARE_PROVIDER_SITE_OTHER): Payer: Medicare Other | Admitting: Internal Medicine

## 2021-09-21 VITALS — BP 114/64 | HR 87 | Temp 97.5°F | Ht 70.6 in | Wt 210.1 lb

## 2021-09-21 DIAGNOSIS — E1142 Type 2 diabetes mellitus with diabetic polyneuropathy: Secondary | ICD-10-CM

## 2021-09-21 DIAGNOSIS — N401 Enlarged prostate with lower urinary tract symptoms: Secondary | ICD-10-CM

## 2021-09-21 DIAGNOSIS — N138 Other obstructive and reflux uropathy: Secondary | ICD-10-CM | POA: Diagnosis not present

## 2021-09-21 DIAGNOSIS — E1165 Type 2 diabetes mellitus with hyperglycemia: Secondary | ICD-10-CM

## 2021-09-21 DIAGNOSIS — I10 Essential (primary) hypertension: Secondary | ICD-10-CM

## 2021-09-21 DIAGNOSIS — M1A00X0 Idiopathic chronic gout, unspecified site, without tophus (tophi): Secondary | ICD-10-CM

## 2021-09-21 DIAGNOSIS — Z0000 Encounter for general adult medical examination without abnormal findings: Secondary | ICD-10-CM | POA: Diagnosis not present

## 2021-09-21 LAB — CBC
HCT: 42.1 % (ref 39.0–52.0)
Hemoglobin: 13.7 g/dL (ref 13.0–17.0)
MCHC: 32.6 g/dL (ref 30.0–36.0)
MCV: 87.8 fl (ref 78.0–100.0)
Platelets: 220 10*3/uL (ref 150.0–400.0)
RBC: 4.79 Mil/uL (ref 4.22–5.81)
RDW: 13.9 % (ref 11.5–15.5)
WBC: 3.7 10*3/uL — ABNORMAL LOW (ref 4.0–10.5)

## 2021-09-21 LAB — URIC ACID: Uric Acid, Serum: 5.4 mg/dL (ref 4.0–7.8)

## 2021-09-21 LAB — RENAL FUNCTION PANEL
Albumin: 4.3 g/dL (ref 3.5–5.2)
BUN: 26 mg/dL — ABNORMAL HIGH (ref 6–23)
CO2: 29 mEq/L (ref 19–32)
Calcium: 9.4 mg/dL (ref 8.4–10.5)
Chloride: 102 mEq/L (ref 96–112)
Creatinine, Ser: 1.43 mg/dL (ref 0.40–1.50)
GFR: 46.54 mL/min — ABNORMAL LOW (ref >60.00)
Glucose, Bld: 146 mg/dL — ABNORMAL HIGH (ref 70–99)
Phosphorus: 4.3 mg/dL (ref 2.3–4.6)
Potassium: 4.7 mEq/L (ref 3.5–5.1)
Sodium: 139 mEq/L (ref 135–145)

## 2021-09-21 LAB — LIPID PANEL
Cholesterol: 166 mg/dL (ref 0–200)
HDL: 36.7 mg/dL — ABNORMAL LOW (ref >39.00)
NonHDL: 129.78
Total CHOL/HDL Ratio: 5
Triglycerides: 333 mg/dL — ABNORMAL HIGH (ref 0.0–149.0)
VLDL: 66.6 mg/dL — ABNORMAL HIGH (ref 0.0–40.0)

## 2021-09-21 LAB — HEPATIC FUNCTION PANEL
ALT: 26 U/L (ref 0–53)
AST: 33 U/L (ref 0–37)
Albumin: 4.3 g/dL (ref 3.5–5.2)
Alkaline Phosphatase: 37 U/L — ABNORMAL LOW (ref 39–117)
Bilirubin, Direct: 0.1 mg/dL (ref 0.0–0.3)
Total Bilirubin: 0.3 mg/dL (ref 0.2–1.2)
Total Protein: 7.3 g/dL (ref 6.0–8.3)

## 2021-09-21 LAB — MICROALBUMIN / CREATININE URINE RATIO
Creatinine,U: 168.8 mg/dL
Microalb Creat Ratio: 1.9 mg/g (ref 0.0–30.0)
Microalb, Ur: 3.2 mg/dL — ABNORMAL HIGH (ref 0.0–1.9)

## 2021-09-21 LAB — HM DIABETES FOOT EXAM

## 2021-09-21 LAB — LDL CHOLESTEROL, DIRECT: Direct LDL: 80 mg/dL

## 2021-09-21 MED ORDER — TAMSULOSIN HCL 0.4 MG PO CAPS: 0.4000 mg | ORAL_CAPSULE | Freq: Every day | ORAL | 3 refills | Status: DC

## 2021-09-21 MED ORDER — COLCHICINE 0.6 MG PO TABS: 0.6000 mg | ORAL_TABLET | ORAL | 5 refills | Status: DC | PRN

## 2021-09-21 NOTE — Assessment & Plan Note (Signed)
I have personally reviewed the Medicare Annual Wellness questionnaire and have noted 1. The patient's medical and social history 2. Their use of alcohol, tobacco or illicit drugs 3. Their current medications and supplements 4. The patient's functional ability including ADL's, fall risks, home safety risks and hearing or visual             impairment. 5. Diet and physical activities 6. Evidence for depression or mood disorders  The patients weight, height, BMI and visual acuity have been recorded in the chart I have made referrals, counseling and provided education to the patient based review of the above and I have provided the pt with a written personalized care plan for preventive services.  I have provided you with a copy of your personalized plan for preventive services. Please take the time to review along with your updated medication list.  Done with cancer screening Discussed picking up on exercise Flu vaccine in the fall Consider updating bivalent COVID vaccine also

## 2021-09-21 NOTE — Progress Notes (Signed)
Hearing Screening - Comments:: Wears hearing aids Vision Screening - Comments:: Last visit with eye doctor two months ago, sees him on a regular basis

## 2021-09-21 NOTE — Assessment & Plan Note (Signed)
occ flares despite uloric---uses colchicine prn

## 2021-09-21 NOTE — Assessment & Plan Note (Signed)
BP Readings from Last 3 Encounters:  09/21/21 114/64  08/17/21 118/72  07/26/21 108/60   Good control on lisinopril 20 mg daily

## 2021-09-21 NOTE — Assessment & Plan Note (Signed)
Lab Results  Component Value Date   HGBA1C 6.9 (A) 08/17/2021   Now with good control on trulicity '3mg'$  weekly, glipizide 10/5, tresiba 12-14 units daily, metformin 1000 bid Gabapentin 600 tid and alpah lipoic acid for neuropathy

## 2021-09-21 NOTE — Progress Notes (Signed)
Subjective:    Patient ID: Lucas [name], male    DOB: Jan 28, 1941, 81 y.o.   MRN: [id number]  HPI Here for Medicare wellness visit and follow up of chronic health conditions Reviewed advanced directives Reviewed other doctors----Dr Gherghe--endocrinology, Dr Hyatt--podiatry, Dr Syed--rheumatology, Dr Caprice Beaver, Dr Edwyna Ready, Dr Haggerty--prosthodontist No hospitalizations or surgery in the past year Vision is okay---does have glaucoma Hearing is not great---aides from [place] do help Not really exercising Has several falls due to knee---now has brace (no injuries) Still enjoys things--like grandkids, etc. No regular depression Wife does most of housework No worrisome memory issues  Doing okay Diabetes control is good Rare high readings----usually under 150 in AM Neuropathy is about the same Uses alpha lipoic acid bid and walking stick  Has to be very careful about his eating Recent gout flare--after seafood Uses the uloric---but needs the colchicine for prn  No chest pain No SOB No dizziness or syncope Slight swelling in left ankle at times  Current Outpatient Medications on File Prior to Visit  Medication Sig Dispense Refill   albuterol (VENTOLIN HFA) 108 (90 Base) MCG/ACT inhaler Inhale 2 puffs into the lungs every 4 (four) hours as needed.     Alpha-Lipoic Acid 600 MG CAPS Take 1 capsule (600 mg total) by mouth in the morning and at bedtime. 60 capsule 3   aspirin 81 MG tablet Take 81 mg by mouth daily.     BD PEN NEEDLE NANO 2ND GEN 32G X 4 MM MISC USE ONCE A DAY 100 each 3   brimonidine (ALPHAGAN) 0.2 % ophthalmic solution Place 1 drop into both eyes 2 (two) times daily.      cetirizine (ZYRTEC) 10 MG tablet Take 10 mg by mouth daily.     colchicine 0.6 MG tablet Take 0.6 mg by mouth as needed. Take 2 tablets by mouth at first sign of gout flare followed by 1 tablet after 1 hour. Next day 1 tab;et for 5 days     Dulaglutide (TRULICITY) 3 GU/4.4IH SOPN Inject 3  mg into the skin once a week. 6 mL 3   fluticasone (FLONASE) 50 MCG/ACT nasal spray      gabapentin (NEURONTIN) 300 MG capsule TAKE 2 CAPSULES (600 MG TOTAL) BY MOUTH 3 (THREE) TIMES DAILY. 540 capsule 3   glipiZIDE (GLUCOTROL XL) 5 MG 24 hr tablet Take 2 tablets by mouth in am and 1 at dinnertime 270 tablet 3   glucose blood (ONE TOUCH ULTRA TEST) test strip Use as instructed to check sugar 2 times daily 200 each 5   indomethacin (INDOCIN) 50 MG capsule Take 1 capsule (50 mg total) by mouth 2 (two) times daily with a meal. (Patient taking differently: Take 50 mg by mouth 2 (two) times daily as needed.) 60 capsule 1   insulin degludec (TRESIBA FLEXTOUCH) 200 UNIT/ML FlexTouch Pen Inject 12-14 Units into the skin daily. 9 mL 3   latanoprost (XALATAN) 0.005 % ophthalmic solution Place 1 drop into both eyes at bedtime.      lisinopril (PRINIVIL,ZESTRIL) 20 MG tablet Take 10 mg by mouth daily.     metFORMIN (GLUCOPHAGE) 1000 MG tablet Take 1,000 mg by mouth 2 (two) times daily with a meal.     Misc Natural Products (GLUCOSAMINE CHONDROITIN ADV PO) Take 2 tablets by mouth 2 (two) times daily.     Multiple Vitamin (MULTIVITAMIN) tablet Take 1 tablet by mouth daily.     Omega-3 Fatty Acids (FISH OIL) 1200 MG CAPS  pravastatin (PRAVACHOL) 20 MG tablet Take 10 mg by mouth daily.      sildenafil (REVATIO) 20 MG tablet TAKE 3 TO 5 TABLETS BY MOUTH ONCE DAILY AS NEEDED 50 tablet 11   triamcinolone ointment (KENALOG) 0.5 % Apply 1 application topically 2 (two) times daily. 30 g 0   ULORIC 40 MG tablet Take 40 mg by mouth daily.  4   Vitamin D, Cholecalciferol, 1000 UNITS CAPS Take 1 capsule by mouth daily.     COMBIGAN 0.2-0.5 % ophthalmic solution  (Patient not taking: Reported on 09/21/2021)     mupirocin ointment (BACTROBAN) 2 % Apply to wound twice a day. 30 g 2   No current facility-administered medications on file prior to visit.    No Known Allergies  Past Medical History:  Diagnosis Date    Allergic rhinitis    Diabetes mellitus type II    Diverticulosis of colon    Glaucoma    peripheral vision loss left eye.    History of agent Orange exposure    HLD (hyperlipidemia)    HTN (hypertension)    Hypertrophy of prostate without urinary obstruction and other lower urinary tract symptoms (LUTS)    Osteoarthritis     Past Surgical History:  Procedure Laterality Date   TOOTH EXTRACTION      Family History  Problem Relation Age of Onset   Colon cancer Father    Stroke Mother    Heart attack Mother    Coronary artery disease Mother    Hypertension Other        several siblings   Diabetes Other        several siblings   Coronary artery disease Brother    ALS Sister    Dementia Sister    Neuropathy Neg Hx     Social History   Socioeconomic History   Marital status: Married    Spouse name: Not on file   Number of children: Not on file   Years of education: Not on file   Highest education level: Not on file  Occupational History   Occupation: Retired-[employer]  Tobacco Use   Smoking status: Never   Smokeless tobacco: Never  Vaping Use   Vaping Use: Never used  Substance and Sexual Activity   Alcohol use: No    Alcohol/week: 0.0 standard drinks of alcohol   Drug use: No   Sexual activity: Not on file  Other Topics Concern   Not on file  Social History Narrative   [place] vet- 20% disability from shrapnel wounds 1967 and 20% agent orange   Married (Widowed 1996; remarried 6/03)   Retired [employer]      Has living will   Wife, then daughter Hoyle Barr will be health care POA   Would want attempts at resuscitation   Would not want feeding tube if cognitively unaware   Social Determinants of Health   Financial Resource Strain: Not on file  Food Insecurity: Not on file  Transportation Needs: Not on file  Physical Activity: Not on file  Stress: Not on file  Social Connections: Not on file  Intimate Partner Violence: Not on file   Review of Systems Doesn't have  much appetite Has lost 5-10# Wears seat belt Sleeps okay Some issues with teeth---told it would $20K to fix No heartburn or dysphagia Bowels are slow---miralax helps Gets urinary urgency--- slight leakage if he delays No sig joint pains. Does get occasional low back pain---intermittent [facility] Restarting allergy med  at night ---cetirizine (may help his itching)     Objective:   Physical Exam Constitutional:      Appearance: Normal appearance.  HENT:     Mouth/Throat:     Comments: No lesions Eyes:     Conjunctiva/sclera: Conjunctivae normal.     Pupils: Pupils are equal, round, and reactive to light.  Cardiovascular:     Rate and Rhythm: Normal rate and regular rhythm.     Pulses: Normal pulses.     Heart sounds: No murmur heard.    No gallop.  Pulmonary:     Effort: Pulmonary effort is normal.     Breath sounds: Normal breath sounds. No wheezing or rales.  Abdominal:     Palpations: Abdomen is soft.     Tenderness: There is no abdominal tenderness.  Musculoskeletal:     Cervical back: Neck supple.     Right lower leg: No edema.     Left lower leg: No edema.  Lymphadenopathy:     Cervical: No cervical adenopathy.  Skin:    Findings: No lesion or rash.     Comments: No foot lesions  Neurological:     Mental Status: He is alert and oriented to person, place, and time.     Comments: Little sensation in feet Mini-cog okay---clock good, recall 2/3  Psychiatric:        Mood and Affect: Mood normal.        Behavior: Behavior normal.            Assessment & Plan:

## 2021-09-21 NOTE — Assessment & Plan Note (Signed)
Worsened symptoms worse Will try tamsulosin 0.'4mg'$  daily

## 2021-11-25 DIAGNOSIS — H401233 Low-tension glaucoma, bilateral, severe stage: Secondary | ICD-10-CM | POA: Diagnosis not present

## 2021-11-25 DIAGNOSIS — E119 Type 2 diabetes mellitus without complications: Secondary | ICD-10-CM | POA: Diagnosis not present

## 2021-11-25 DIAGNOSIS — H4720 Unspecified optic atrophy: Secondary | ICD-10-CM | POA: Diagnosis not present

## 2021-12-19 ENCOUNTER — Encounter: Payer: Self-pay | Admitting: Emergency Medicine

## 2021-12-19 ENCOUNTER — Ambulatory Visit
Admission: EM | Admit: 2021-12-19 | Discharge: 2021-12-19 | Disposition: A | Discharge status: Home / Self Care | Payer: Medicare Other | Attending: Urgent Care | Admitting: Urgent Care

## 2021-12-19 DIAGNOSIS — R296 Repeated falls: Secondary | ICD-10-CM

## 2021-12-19 DIAGNOSIS — I951 Orthostatic hypotension: Secondary | ICD-10-CM

## 2021-12-19 NOTE — ED Provider Notes (Signed)
Lucas [name]    CSN: [id number] Arrival date & time: 12/19/21  1216      History   Chief Complaint Chief Complaint  Patient presents with   Fall    HPI Lucas [name] is a 81 y.o. male.    Fall   Accompanied by his wife.  Presents with report of fall that occurred this morning.  Endorses history of multiple falls.  Past medical history significant for type 2 diabetes, poorly controlled, hypertension on multiple antihypertensives, BPH recently started on finasteride.  He endorses peripheral neuropathy "I cannot feel my feet" as well as vision loss in his left eye.  Reports recent evaluation in PCP at the [place] and found with low blood pressure during that visit.  Labs collected, no results available yet.  Patient reports that his physician is considering reducing his blood pressure medication dose.  Also reports physician told him to drink more water and always use his walking stick.  Patient endorses no injuries after fall this morning.  Reports measuring low blood pressure and dizziness.  Did not hit his head.  No loss of consciousness.  Denies chest pain, palpitations, shortness of breath, headache.    Past Medical History:  Diagnosis Date   Allergic rhinitis    Diabetes mellitus type II    Diverticulosis of colon    Glaucoma    peripheral vision loss left eye.    History of agent Orange exposure    HLD (hyperlipidemia)    HTN (hypertension)    Hypertrophy of prostate without urinary obstruction and other lower urinary tract symptoms (LUTS)    Osteoarthritis     Patient Active Problem List   Diagnosis Date Noted   Benign neoplasm of colon 07/19/2020   Chronic sinusitis 07/19/2020   Glaucoma 07/19/2020   Diabetic polyneuropathy associated with type 2 diabetes mellitus ([place]) 10/27/2019   Sensory ataxia 10/27/2019   Poorly controlled type 2 diabetes mellitus with peripheral neuropathy ([place]) 09/02/2019   Obesity, Class I, BMI 30-34.9 12/07/2017   DDD  (degenerative disc disease), lumbar 05/16/2016   Advance directive discussed with patient 07/28/2015   Gouty arthropathy, chronic, without tophi 07/08/2015   Hypertension 07/08/2015   Obstructive sleep apnea 07/22/2014   Routine general medical examination at a health care facility 06/06/2011   BPH with obstruction/lower urinary tract symptoms    Generalized osteoarthritis of multiple sites 09/20/2006   Hyperlipidemia 09/17/2006   ALLERGIC RHINITIS 09/17/2006   DIVERTICULOSIS, COLON 09/17/2006    Past Surgical History:  Procedure Laterality Date   TOOTH EXTRACTION         Home Medications    Prior to Admission medications   Medication Sig Start Date End Date Taking? Authorizing Provider  aspirin 81 MG tablet Take 81 mg by mouth daily.   Yes [provider]  brimonidine (ALPHAGAN) 0.2 % ophthalmic solution Place 1 drop into both eyes 2 (two) times daily.  06/03/11  Yes [provider]  Dulaglutide (TRULICITY) 3 FB/5.1WC SOPN Inject 3 mg into the skin once a week. 08/17/21  Yes Philemon Kingdom, MD  gabapentin (NEURONTIN) 300 MG capsule TAKE 2 CAPSULES (600 MG TOTAL) BY MOUTH 3 (THREE) TIMES DAILY. 01/04/21  Yes [name], MD  glipiZIDE (GLUCOTROL XL) 5 MG 24 hr tablet Take 2 tablets by mouth in am and 1 at dinnertime 08/17/21  Yes Philemon Kingdom, MD  latanoprost (XALATAN) 0.005 % ophthalmic solution Place 1 drop into both eyes at bedtime.  04/01/11  Yes [provider]  lisinopril (PRINIVIL,ZESTRIL) 20 MG tablet Take 10 mg by mouth daily.   Yes [provider]  metFORMIN (GLUCOPHAGE) 1000 MG tablet Take 1,000 mg by mouth 2 (two) times daily with a meal.   Yes [provider]  Multiple Vitamin (MULTIVITAMIN) tablet Take 1 tablet by mouth daily.   Yes [provider]  Omega-3 Fatty Acids (FISH OIL) 1200 MG CAPS  02/08/18  Yes [provider]  pravastatin (PRAVACHOL) 20 MG tablet Take 10 mg by mouth daily.  05/22/11   Yes [provider]  tamsulosin (FLOMAX) 0.4 MG CAPS capsule Take 1 capsule (0.4 mg total) by mouth daily. 09/21/21  Yes [name], MD  ULORIC 40 MG tablet Take 40 mg by mouth daily. 11/22/16  Yes [provider]  Vitamin D, Cholecalciferol, 1000 UNITS CAPS Take 1 capsule by mouth daily.   Yes [provider]  albuterol (VENTOLIN HFA) 108 (90 Base) MCG/ACT inhaler Inhale 2 puffs into the lungs every 4 (four) hours as needed.    [provider]  Alpha-Lipoic Acid 600 MG CAPS Take 1 capsule (600 mg total) by mouth in the morning and at bedtime. 10/27/19   Melvenia Beam, MD  BD PEN NEEDLE NANO 2ND GEN 32G X 4 MM MISC USE ONCE A DAY 05/23/21   Philemon Kingdom, MD  cetirizine (ZYRTEC) 10 MG tablet Take 10 mg by mouth daily.    [provider]  colchicine 0.6 MG tablet Take 1 tablet (0.6 mg total) by mouth as needed. Take 2 tablets by mouth at first sign of gout flare followed by 1 tablet after 1 hour. Next day 1 tab;et for 5 days 09/21/21   [name], MD  fluticasone [facility]) 50 MCG/ACT nasal spray  08/14/14   [provider]  glucose blood (ONE TOUCH ULTRA TEST) test strip Use as instructed to check sugar 2 times daily 09/22/16   Philemon Kingdom, MD  indomethacin (INDOCIN) 50 MG capsule Take 1 capsule (50 mg total) by mouth 2 (two) times daily with a meal. Patient taking differently: Take 50 mg by mouth 2 (two) times daily as needed. 06/28/15   Hyatt, Max T, DPM  insulin degludec (TRESIBA FLEXTOUCH) 200 UNIT/ML FlexTouch Pen Inject 12-14 Units into the skin daily. 08/17/21   Philemon Kingdom, MD  Misc Natural Products (GLUCOSAMINE CHONDROITIN ADV PO) Take 2 tablets by mouth 2 (two) times daily.    [provider]  mupirocin ointment (BACTROBAN) 2 % Apply to wound twice a day. 02/14/17   Hyatt, Max T, DPM  sildenafil (REVATIO) 20 MG tablet TAKE 3 TO 5 TABLETS BY MOUTH ONCE DAILY AS NEEDED 06/19/17   Viviana Simpler I, MD   triamcinolone ointment (KENALOG) 0.5 % Apply 1 application topically 2 (two) times daily. 11/14/16   Inda Coke, PA    Family History Family History  Problem Relation Age of Onset   Colon cancer Father    Stroke Mother    Heart attack Mother    Coronary artery disease Mother    Hypertension Other        several siblings   Diabetes Other        several siblings   Coronary artery disease Brother    ALS Sister    Dementia Sister    Neuropathy Neg Hx     Social History Social History   Tobacco Use   Smoking status: Never   Smokeless tobacco: Never  Vaping Use   Vaping Use: Never  used  Substance Use Topics   Alcohol use: No    Alcohol/week: 0.0 standard drinks of alcohol   Drug use: No     Allergies   Patient has no known allergies.   Review of Systems Review of Systems   Physical Exam Triage Vital Signs ED Triage Vitals  Enc Vitals Group     BP 12/19/21 1229 107/69     Pulse Rate 12/19/21 1229 74     Resp 12/19/21 1229 16     Temp 12/19/21 1229 97.7 F (36.5 C)     Temp Source 12/19/21 1229 Oral     SpO2 12/19/21 1229 98 %     Weight --      Height --      Head Circumference --      Peak Flow --      Pain Score 12/19/21 1230 0     Pain Loc --      Pain Edu? --      Excl. in [place]? --    Orthostatic VS for the past 24 hrs:  BP- Lying Pulse- Lying BP- Sitting Pulse- Sitting BP- Standing at 0 minutes Pulse- Standing at 0 minutes  12/19/21 1239 106/40 68 101/65 68 107/69 74    Updated Vital Signs BP 107/69 (BP Location: Left Arm)   Pulse 74   Temp 97.7 F (36.5 C) (Oral)   Resp 16   SpO2 98%   Visual Acuity Right Eye Distance:   Left Eye Distance:   Bilateral Distance:    Right Eye Near:   Left Eye Near:    Bilateral Near:     Physical Exam Vitals reviewed.  Constitutional:      Appearance: Normal appearance.  Cardiovascular:     Rate and Rhythm: Normal rate and regular rhythm.     Pulses: Normal pulses.     Heart sounds: Normal  heart sounds.  Pulmonary:     Effort: Pulmonary effort is normal.  Skin:    General: Skin is warm and dry.  Neurological:     General: No focal deficit present.     Mental Status: He is alert and oriented to person, place, and time.     Cranial Nerves: Cranial nerves 2-12 are intact.     Sensory: Sensory deficit present.     Motor: Motor function is intact.     Coordination: Coordination is intact.  Psychiatric:        Mood and Affect: Mood normal.        Behavior: Behavior normal.      UC Treatments / Results  Labs (all labs ordered are listed, but only abnormal results are displayed) Labs Reviewed - No data to display  EKG   Radiology No results found.  Procedures Procedures (including critical care time)  Medications Ordered in UC Medications - No data to display  Initial Impression / Assessment and Plan / UC Course  I have reviewed the triage vital signs and the nursing notes.  Pertinent labs & imaging results that were available during my care of the patient were reviewed by me and considered in my medical decision making (see chart for details).    Neuro exam is grossly normal.  Patient is alert and oriented x3.  Fall this morning likely due to a combination of lower extremity neuropathy, unilateral loss of vision, and orthostatic hypotension.  Spent time talking to patient about his symptoms and how they can contribute to risk for falling.  Recommended changes to his  living environment to keep him safe including removing throw rugs.  Always use a walking stick or use wall or furniture to provide balance.  Change position slowly to allow his body to equalize blood pressure.  Blood pressure soft today.  Follow-up with your PCP for possible need of dose change of antihypertensives.  Final Clinical Impressions(s) / UC Diagnoses   Final diagnoses:  None   Discharge Instructions   None    ED Prescriptions   None    PDMP not reviewed this encounter.    Rose Phi, [place] 12/19/21 1333

## 2021-12-19 NOTE — ED Triage Notes (Signed)
Patient c/o Fall that started this morning.   Patient denies Chest Pain, Palpitations, SOB, or Headache.   Patient denies hitting head or LOC. Patient denies dizziness.   Patient endorses dizziness before fall.   Patient endorses a BP of 123/79 and HR of 74 this morning.   Patient endorses low BP after fall.

## 2021-12-19 NOTE — Discharge Instructions (Signed)
Follow-up with your PCP.

## 2021-12-21 ENCOUNTER — Encounter: Payer: Self-pay | Admitting: Internal Medicine

## 2021-12-21 ENCOUNTER — Ambulatory Visit (INDEPENDENT_AMBULATORY_CARE_PROVIDER_SITE_OTHER): Payer: Medicare Other | Admitting: Internal Medicine

## 2021-12-21 VITALS — BP 116/68 | HR 79 | Ht 70.6 in | Wt 210.6 lb

## 2021-12-21 DIAGNOSIS — E669 Obesity, unspecified: Secondary | ICD-10-CM | POA: Diagnosis not present

## 2021-12-21 DIAGNOSIS — E1142 Type 2 diabetes mellitus with diabetic polyneuropathy: Secondary | ICD-10-CM | POA: Diagnosis not present

## 2021-12-21 DIAGNOSIS — E1165 Type 2 diabetes mellitus with hyperglycemia: Secondary | ICD-10-CM | POA: Diagnosis not present

## 2021-12-21 DIAGNOSIS — E785 Hyperlipidemia, unspecified: Secondary | ICD-10-CM | POA: Diagnosis not present

## 2021-12-21 LAB — POCT GLYCOSYLATED HEMOGLOBIN (HGB A1C): Hemoglobin A1C: 7.3 % — AB (ref 4.0–5.6)

## 2021-12-21 NOTE — Progress Notes (Signed)
Patient ID: Lucas [name], male   DOB: 31-May-1940, 81 y.o.   MRN: [id number]  HPI: Lucas [name] is a 81 y.o.-year-old male, presenting for f/u for DM2, dx in 1998, insulin-dependent (previous Agent Orange exposure in [place]), uncontrolled, with complications (mild CKD, foot ulcer). Last visit 4 months ago.  Interim hx: No blurry vision, nausea, chest pain.  He continues to have knee pain and walks with a cane.  He had recurrent falls since last visit and was found to have orthostatic hypotension. His BP med doses were changed. He still has constipation-on MiraLAX.  He noticed that when he increased activity his constipation improved.  He describes decreased appetite, no nausea.  Reviewed HbA1c levels: Lab Results  Component Value Date   HGBA1C 6.9 (A) 08/17/2021   HGBA1C 7.1 (A) 04/19/2021   HGBA1C 6.9 (A) 12/14/2020   HGBA1C 6.7 (A) 07/05/2020   HGBA1C 7.8 (A) 03/01/2020   HGBA1C 7.8 (A) 10/27/2019   HGBA1C 8.2 (A) 07/21/2019   HGBA1C 7.6 (A) 03/17/2019   HGBA1C 8.1 (H) 09/24/2018   HGBA1C 7.9 (A) 03/18/2018   HGBA1C 7.6 (A) 12/07/2017   HGBA1C 7.8 08/06/2017   HGBA1C 7.4 05/10/2017   HGBA1C 8.3 11/29/2016   HGBA1C 7.7 05/08/2016   HGBA1C 7.7 12/17/2015   HGBA1C 8.8 (H) 07/28/2015   HGBA1C 7.2 02/05/2015   HGBA1C 6.7 11/05/2014   HGBA1C 9.1 (A) 06/29/2014   HGBA1C 8.7 (H) 01/13/2014   HGBA1C 7.9 (H) 07/14/2013   HGBA1C 7.6 (H) 12/31/2012   HGBA1C 7.8 (H) 07/02/2012   HGBA1C 8.4 (H) 02/27/2012   Pt is on a regimen of: - Metformin 2000 mg with dinner - Glipizide XL 10 mg before breakfast and 5 mg before dinner - Trulicity 1.5 >> 3 mg weekly - Tresiba 12 - started 02/2020 >> 20 >> 12 >> 12-14 >> 12.5 units daily  He had to stop [place] as K increased (took 1 mo, off for 1.5 mo). Invokana was not covered.  Pt checks his sugars 1-2 times a day - am:  65, 100-157, 177 >> 71-131, 183 >> 87-149, 176 - 2h after b'fast: 220 >> n/c >> 233 >> n/c >> 292 >> n/c - before lunch:  n/c >> 101-118 >> n/c >> 151 >> n/c - 2h after lunch:  154 >> n/c >> 114-137 >> n/c - before dinner:  95-123 >> n/c >> 85 >> n/c >> 123-138 - 2h after dinner:n/c  >> 160 >> 167, 172 >> 160 >> n/c - bedtime:  109-130 >> n/c >> 137 >> n/c - nighttime: n/c >> 165 >> n/c >> 117 >> n/c Lowest sugar was 65 >> 71>> 87; he has hypoglycemia awareness in the 70s. Highest sugar was 177 >> 292 >> 202.  Glucometer: Precision >> One Touch Ultra mini >> Livongo >> BioTel  Pt's meals are: - Breakfast:  Oatmeal, wheat English muffin  - Lunch: salad, 1/2 sandwich - Dinner: meat + veggies, added carbs back - Snacks: 2x a day, fruit bar, fresh fruit - snacking after dinner  + Mild CKD: Lab Results  Component Value Date   BUN 26 (H) 09/21/2021   CREATININE 1.43 09/21/2021  04/16/2017: 15/1.2 07/13/2016: BUN/Cr 13/1.0, Glu 116, UACR 0.5 11/10/2015: EGFR 80, creatinine 1.1 04/15/2015: GFR 88.1, Cr 1.06 On lisinopril 10 mg daily.  + HL; last set of lipids: Lab Results  Component Value Date   CHOL 166 09/21/2021   HDL 36.70 (L) 09/21/2021   LDLCALC 104 07/13/2016   LDLDIRECT 80.0  09/21/2021   TRIG 333.0 (H) 09/21/2021   CHOLHDL 5 09/21/2021  04/16/2016: 179/288/44/77 04/15/2015: LDL 89 On pravastatin 10. On fish oil 1000 mg daily >> increased to 2x a day 12/2020.  - last dilated eye exam was in 07/2021: No DR, + glaucoma. 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  - + numbness and tingling in his feet.  He is on alpha-lipoic acid by neurology.  He also sees podiatry.  Latest foot exam September 21, 2021.  He was in a DM clinical trial before: "Jump Start"  lifestyle Research Trial. He was on the [employer] and lifestyle program in the past.  He also has a history of HTN, ED. He has gout - prev. on Alopurinol >> then  on Uloric(he had a rash with allopurinol) He had a R foot ulcer 04/2017 and again in 11/2017. He is seen by a vascular dr.  Lynnell Dike were normal then. In 11/2017 he was getting  Krysexxa injections for gout.  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  ROS: + see HPI Neurological: no tremors/+ numbness/+ tingling/no dizziness  I reviewed pt's medications, allergies, PMH, social hx, family hx, and changes were documented in the history of present illness. Otherwise, unchanged from my initial visit note.  Past Medical History:  Diagnosis Date   Allergic rhinitis    Diabetes mellitus type II    Diverticulosis of colon    Glaucoma    peripheral vision loss left eye.    History of agent Orange exposure    HLD (hyperlipidemia)    HTN (hypertension)    Hypertrophy of prostate without urinary obstruction and other lower urinary tract symptoms (LUTS)    Osteoarthritis    Past Surgical History:  Procedure Laterality Date   TOOTH EXTRACTION       History   Social History   Marital Status: Married    Spouse Name: N/A   Number of Children: 2   Occupational History   Retired-[employer]    Social History Main Topics   Smoking status: Never Smoker    Smokeless tobacco: Never Used   Alcohol Use: No   Drug Use: No    [place] vet- 20% disability from shrapnel wounds 1967 and 20% agent orange   Married (Widowed 1996; remarried 6/03)   Retired [employer]      Has living will   Wife, then daughter Hoyle Barr will be health care POA   Would want attempts at resuscitation   Would not want feeding tube if cognitively unaware   Current Outpatient Medications on File Prior to Visit  Medication Sig Dispense Refill   albuterol (VENTOLIN HFA) 108 (90 Base) MCG/ACT inhaler Inhale 2 puffs into the lungs every 4 (four) hours as needed.     Alpha-Lipoic Acid 600 MG CAPS Take 1 capsule (600 mg total) by mouth in the morning and at bedtime. 60 capsule 3   aspirin 81 MG tablet Take 81 mg by mouth daily.     BD PEN NEEDLE NANO 2ND GEN 32G X 4 MM MISC USE ONCE A DAY 100 each 3   brimonidine (ALPHAGAN) 0.2 % ophthalmic solution Place 1 drop into both eyes 2  (two) times daily.      cetirizine (ZYRTEC) 10 MG tablet Take 10 mg by mouth daily.     colchicine 0.6 MG tablet Take 1 tablet (0.6 mg total) by mouth as needed. Take 2 tablets by mouth at first sign  of gout flare followed by 1 tablet after 1 hour. Next day 1 tab;et for 5 days 60 tablet 5   Dulaglutide (TRULICITY) 3 QV/9.5GL SOPN Inject 3 mg into the skin once a week. 6 mL 3   fluticasone (FLONASE) 50 MCG/ACT nasal spray      gabapentin (NEURONTIN) 300 MG capsule TAKE 2 CAPSULES (600 MG TOTAL) BY MOUTH 3 (THREE) TIMES DAILY. 540 capsule 3   glipiZIDE (GLUCOTROL XL) 5 MG 24 hr tablet Take 2 tablets by mouth in am and 1 at dinnertime 270 tablet 3   glucose blood (ONE TOUCH ULTRA TEST) test strip Use as instructed to check sugar 2 times daily 200 each 5   indomethacin (INDOCIN) 50 MG capsule Take 1 capsule (50 mg total) by mouth 2 (two) times daily with a meal. (Patient taking differently: Take 50 mg by mouth 2 (two) times daily as needed.) 60 capsule 1   insulin degludec (TRESIBA FLEXTOUCH) 200 UNIT/ML FlexTouch Pen Inject 12-14 Units into the skin daily. 9 mL 3   latanoprost (XALATAN) 0.005 % ophthalmic solution Place 1 drop into both eyes at bedtime.      lisinopril (PRINIVIL,ZESTRIL) 20 MG tablet Take 10 mg by mouth daily.     metFORMIN (GLUCOPHAGE) 1000 MG tablet Take 1,000 mg by mouth 2 (two) times daily with a meal.     Misc Natural Products (GLUCOSAMINE CHONDROITIN ADV PO) Take 2 tablets by mouth 2 (two) times daily.     Multiple Vitamin (MULTIVITAMIN) tablet Take 1 tablet by mouth daily.     mupirocin ointment (BACTROBAN) 2 % Apply to wound twice a day. 30 g 2   Omega-3 Fatty Acids (FISH OIL) 1200 MG CAPS      pravastatin (PRAVACHOL) 20 MG tablet Take 10 mg by mouth daily.      sildenafil (REVATIO) 20 MG tablet TAKE 3 TO 5 TABLETS BY MOUTH ONCE DAILY AS NEEDED 50 tablet 11   tamsulosin (FLOMAX) 0.4 MG CAPS capsule Take 1 capsule (0.4 mg total) by mouth daily. 90 capsule 3   triamcinolone  ointment (KENALOG) 0.5 % Apply 1 application topically 2 (two) times daily. 30 g 0   ULORIC 40 MG tablet Take 40 mg by mouth daily.  4   Vitamin D, Cholecalciferol, 1000 UNITS CAPS Take 1 capsule by mouth daily.     No current facility-administered medications on file prior to visit.   No Known Allergies Family History  Problem Relation Age of Onset   Colon cancer Father    Stroke Mother    Heart attack Mother    Coronary artery disease Mother    Hypertension Other        several siblings   Diabetes Other        several siblings   Coronary artery disease Brother    ALS Sister    Dementia Sister    Neuropathy Neg Hx    PE: BP 116/68 (BP Location: Right Arm, Patient Position: Sitting, Cuff Size: Normal)   Pulse 79   Ht 5' 10.6" (1.793 m)   Wt 210 lb 9.6 oz (95.5 kg)   SpO2 98%   BMI 29.71 kg/m   Wt Readings from Last 3 Encounters:  12/21/21 210 lb 9.6 oz (95.5 kg)  09/21/21 210 lb 2 oz (95.3 kg)  08/17/21 218 lb 9.6 oz (99.2 kg)  Constitutional: overweight, in NAD Eyes: no exophthalmos ENT: moist mucous membranes, no masses palpated in neck, no cervical lymphadenopathy Cardiovascular: RRR, No MRG Respiratory: CTA  B Musculoskeletal: no deformities Skin: moist, warm, no rashes Neurological: no tremor with outstretched hands  ASSESSMENT: 1. DM2, insulin-dependent, uncontrolled, with complications - R foot ulcer. Nl ABIs - mild CKD  2. HL  3.  Obesity class I  PLAN:  1. Patient with longstanding, uncontrolled, type 2 diabetes, on oral antidiabetic regimen with metformin and sulfonylurea and also injectable regimen with weekly GLP-1 receptor agonist and daily long-acting insulin.  At last visit, his HbA1c was better, at 6.9%.  At that time, sugars appears to be mostly at goal in the morning but he was not checking later in the day. He only had 1 blood sugar check after breakfast and this was very high, 292 and once before lunch, and this was also above target at 151.  We  did not change the regimen at that time but I did advise him to check more frequently later in the day. -At today's visit, sugars are mostly at goal or only slightly above in the morning, up to 140s, rarely higher.  He is still not checking consistently later in the day, he only had few values checked before dinner and they were at or slightly above goal. -As of now, he is having problems with overcontrolled hypertension and has had falls and orthostatic hypotension.  His medications are being adjusted.  My suggestion for now is to check his blood sugars whenever he has episodes of dizziness or feeling poorly to see if he is not dropping his sugars at that time.  If his sugars are 70s or lower, we may need to decrease his glipizide dose.  I advised him to let me know.  However, for now, I advised him to continue the same regimen. - I suggested to:  Patient Instructions  Please continue: - Metformin 2000 mg with dinner - Glipizide XL 10 mg in a.m. and 5 mg before dinner - Trulicity 3 mg weekly - Tresiba 12.5 units daily  Check some sugars later in the day, including at bedtime.  Please return in 4 months with your sugar log.   - we checked his HbA1c: 7.3% - higher - advised to check sugars at different times of the day - 2x a day, rotating check times - advised for yearly eye exams >> he is UTD - return to clinic in 4 months  2. HL -The latest lipid panel from 09/2021: LDL improved, but still above our target of less than 70, triglycerides high, HDL low: Lab Results  Component Value Date   CHOL 166 09/21/2021   HDL 36.70 (L) 09/21/2021   LDLCALC 104 07/13/2016   LDLDIRECT 80.0 09/21/2021   TRIG 333.0 (H) 09/21/2021   CHOLHDL 5 09/21/2021  -He is on pravastatin 10 mg daily and omega-3 fatty acids 1000 mg twice a day-no side effects  3. Obesity class I -We will continue on Trulicity, which should also help with weight loss -Weight was approximately stable at last visit and lost 8 lbs  since  Philemon Kingdom, MD PhD [facility] Endocrinology

## 2021-12-21 NOTE — Patient Instructions (Signed)
Please continue: - Metformin 2000 mg with dinner - Glipizide XL 10 mg in a.m. and 5 mg before dinner - Trulicity 3 mg weekly - Tresiba 12.5 units daily  Check some sugars later in the day, including at bedtime.  Please return in 4 months with your sugar log.

## 2022-01-08 ENCOUNTER — Other Ambulatory Visit: Payer: Self-pay | Admitting: Internal Medicine

## 2022-01-25 ENCOUNTER — Ambulatory Visit (INDEPENDENT_AMBULATORY_CARE_PROVIDER_SITE_OTHER): Payer: Medicare Other | Admitting: Podiatry

## 2022-01-25 ENCOUNTER — Encounter: Payer: Self-pay | Admitting: Podiatry

## 2022-01-25 DIAGNOSIS — E1142 Type 2 diabetes mellitus with diabetic polyneuropathy: Secondary | ICD-10-CM

## 2022-01-25 DIAGNOSIS — B351 Tinea unguium: Secondary | ICD-10-CM

## 2022-01-25 DIAGNOSIS — M79676 Pain in unspecified toe(s): Secondary | ICD-10-CM

## 2022-01-25 NOTE — Progress Notes (Signed)
He presents today chief complaint of painful elongated toenails and neuropathy.  Objective: Vital signs stable he is alert oriented x3 there is no erythema edema cellulitis drainage odor pulses remain palpable.  He does have neuropathy based on physical exam.  Toenails are long thick yellow dystrophic.  Assessment: Diabetes mellitus with diabetic peripheral neuropathy pain in limb secondary onychomycosis.  Plan: Debridement of toenails 1 through 5 bilateral.

## 2022-03-21 DIAGNOSIS — H401223 Low-tension glaucoma, left eye, severe stage: Secondary | ICD-10-CM | POA: Diagnosis not present

## 2022-03-21 DIAGNOSIS — H2513 Age-related nuclear cataract, bilateral: Secondary | ICD-10-CM | POA: Diagnosis not present

## 2022-03-21 DIAGNOSIS — H4720 Unspecified optic atrophy: Secondary | ICD-10-CM | POA: Diagnosis not present

## 2022-04-04 ENCOUNTER — Other Ambulatory Visit: Payer: Self-pay | Admitting: Internal Medicine

## 2022-04-26 ENCOUNTER — Ambulatory Visit (INDEPENDENT_AMBULATORY_CARE_PROVIDER_SITE_OTHER): Payer: Medicare Other | Admitting: Internal Medicine

## 2022-04-26 ENCOUNTER — Encounter: Payer: Self-pay | Admitting: Internal Medicine

## 2022-04-26 VITALS — BP 128/68 | HR 92 | Ht 70.5 in | Wt 210.0 lb

## 2022-04-26 DIAGNOSIS — E669 Obesity, unspecified: Secondary | ICD-10-CM | POA: Diagnosis not present

## 2022-04-26 DIAGNOSIS — E1165 Type 2 diabetes mellitus with hyperglycemia: Secondary | ICD-10-CM | POA: Diagnosis not present

## 2022-04-26 DIAGNOSIS — E785 Hyperlipidemia, unspecified: Secondary | ICD-10-CM | POA: Diagnosis not present

## 2022-04-26 DIAGNOSIS — E1142 Type 2 diabetes mellitus with diabetic polyneuropathy: Secondary | ICD-10-CM

## 2022-04-26 LAB — POCT GLYCOSYLATED HEMOGLOBIN (HGB A1C): Hemoglobin A1C: 7.4 % — AB (ref 4.0–5.6)

## 2022-04-26 NOTE — Patient Instructions (Addendum)
Please continue: - Metformin 2000 mg with dinner - Trulicity 3 mg weekly  Decrease: - Glipizide XL 5 mg in a.m. and 5 mg before dinner  Increase: - Tresiba 10 units daily  Check some sugars later in the day, including at bedtime.  Please return in 4 months with your sugar log.

## 2022-04-26 NOTE — Progress Notes (Signed)
Patient ID: Lucas [name], male   DOB: [birth year], 82 y.o.   MRN: [id number]  HPI: Lucas [name] is a 82 y.o.-year-old male, presenting for f/u for DM2, dx in 1998, insulin-dependent (previous Agent Orange exposure in [place]), uncontrolled, with complications (mild CKD, foot ulcer). Last visit 4 months ago.  Interim hx: No blurry vision, nausea, chest pain.  He had recurrent falls before last visit and was found to have orthostatic hypotension. His BP med doses were changed. He has not fallen since. He still has constipation-on MiraLAX.  He noticed that when he increased activity his constipation improved. He continues to have knee pain - walks with a cane.  Reviewed HbA1c levels: Lab Results  Component Value Date   HGBA1C 7.3 (A) 12/21/2021   HGBA1C 6.9 (A) 08/17/2021   HGBA1C 7.1 (A) 04/19/2021   HGBA1C 6.9 (A) 12/14/2020   HGBA1C 6.7 (A) 07/05/2020   HGBA1C 7.8 (A) 03/01/2020   HGBA1C 7.8 (A) 10/27/2019   HGBA1C 8.2 (A) 07/21/2019   HGBA1C 7.6 (A) 03/17/2019   HGBA1C 8.1 (H) 09/24/2018   HGBA1C 7.9 (A) 03/18/2018   HGBA1C 7.6 (A) 12/07/2017   HGBA1C 7.8 08/06/2017   HGBA1C 7.4 05/10/2017   HGBA1C 8.3 11/29/2016   HGBA1C 7.7 05/08/2016   HGBA1C 7.7 12/17/2015   HGBA1C 8.8 (H) 07/28/2015   HGBA1C 7.2 02/05/2015   HGBA1C 6.7 11/05/2014   HGBA1C 9.1 (A) 06/29/2014   HGBA1C 8.7 (H) 01/13/2014   HGBA1C 7.9 (H) 07/14/2013   HGBA1C 7.6 (H) 12/31/2012   HGBA1C 7.8 (H) 07/02/2012   Pt is on a regimen of: - Metformin 2000 mg with dinner - Glipizide XL 10 mg before breakfast and 5 mg before dinner - Trulicity 1.5 >> 3 mg weekly - Tresiba 12 - started 02/2020 >> 20 >> 12 >> 12-14 >> 12.5 >> he decreased by himself to 8 units daily  He had to stop [place] as K increased (took 1 mo, off for 1.5 mo). Invokana was not covered.  Pt checks his sugars 1-2 times a day - am:  65, 100-157, 177 >> 71-131, 183 >> 87-149, 176 >> 90, 96-140, 158, 205, 212  - 2h after b'fast: 220 >> n/c >>  233 >> n/c >> 292 >> n/c - before lunch: n/c >> 101-118 >> n/c >> 151 >> n/c - 2h after lunch:  154 >> n/c >> 114-137 >> n/c - before dinner:  95-123 >> n/c >> 85 >> n/c >> 123-138 >> n/c - 2h after dinner:n/c  >> 160 >> 167, 172 >> 160 >> n/c - bedtime:  109-130 >> n/c >> 137 >> n/c - nighttime: n/c >> 165 >> n/c >> 117 >> n/c Lowest sugar was 65 >> 71>> 87 >> 90s; he has hypoglycemia awareness in the 70s. Highest sugar was 177 >> 292 >> 202 >> 212.  Glucometer: Precision >> One Touch Ultra mini >> Livongo >> BioTel  Pt's meals are: - Breakfast:  Oatmeal, wheat English muffin  - Lunch: salad, 1/2 sandwich - Dinner: meat + veggies, added carbs back - Snacks: 2x a day, fruit bar, fresh fruit - snacking after dinner  + Mild CKD: Lab Results  Component Value Date   BUN 26 (H) 09/21/2021   CREATININE 1.43 09/21/2021  04/16/2017: 15/1.2 07/13/2016: BUN/Cr 13/1.0, Glu 116, UACR 0.5 11/10/2015: EGFR 80, creatinine 1.1 04/15/2015: GFR 88.1, Cr 1.06 On lisinopril 10 mg daily.  + HL; last set of lipids: Lab Results  Component Value Date   CHOL  166 09/21/2021   HDL 36.70 (L) 09/21/2021   LDLCALC 104 07/13/2016   LDLDIRECT 80.0 09/21/2021   TRIG 333.0 (H) 09/21/2021   CHOLHDL 5 09/21/2021  04/16/2016: 179/288/44/77 04/15/2015: LDL 89 On pravastatin 10. On fish oil 1000 mg daily >> increased to 2x a day 12/2020.  - last dilated eye exam was in 07/2021: No DR, + glaucoma. 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  - + numbness and tingling in his feet.  He is on alpha-lipoic acid by neurology.  He also sees podiatry.  Latest foot exam 09/21/2021.  He was in a DM clinical trial before: "Jump Start"  lifestyle Research Trial. He was on the [employer] and lifestyle program in the past.  He also has a history of HTN, ED. He has gout - prev. on Alopurinol >> then  on Uloric(he had a rash with allopurinol) He had a R foot ulcer 04/2017 and again in 11/2017. He is seen by a  vascular dr.  Lynnell Dike were normal then. In 11/2017 he was getting Krysexxa injections for gout.  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  ROS: + see HPI  I reviewed pt's medications, allergies, PMH, social hx, family hx, and changes were documented in the history of present illness. Otherwise, unchanged from my initial visit note.  Past Medical History:  Diagnosis Date   Allergic rhinitis    Diabetes mellitus type II    Diverticulosis of colon    Glaucoma    peripheral vision loss left eye.    History of agent Orange exposure    HLD (hyperlipidemia)    HTN (hypertension)    Hypertrophy of prostate without urinary obstruction and other lower urinary tract symptoms (LUTS)    Osteoarthritis    Past Surgical History:  Procedure Laterality Date   TOOTH EXTRACTION       History   Social History   Marital Status: Married    Spouse Name: N/A   Number of Children: 2   Occupational History   Retired-[employer]    Social History Main Topics   Smoking status: Never Smoker    Smokeless tobacco: Never Used   Alcohol Use: No   Drug Use: No    [place] vet- 20% disability from shrapnel wounds 1967 and 20% agent orange   Married (Widowed 1996; remarried 6/03)   Retired [employer]      Has living will   Wife, then daughter Hoyle Barr will be health care POA   Would want attempts at resuscitation   Would not want feeding tube if cognitively unaware   Current Outpatient Medications on File Prior to Visit  Medication Sig Dispense Refill   albuterol (VENTOLIN HFA) 108 (90 Base) MCG/ACT inhaler Inhale 2 puffs into the lungs every 4 (four) hours as needed.     Alpha-Lipoic Acid 600 MG CAPS Take 1 capsule (600 mg total) by mouth in the morning and at bedtime. 60 capsule 3   aspirin 81 MG tablet Take 81 mg by mouth daily.     BD PEN NEEDLE NANO 2ND GEN 32G X 4 MM MISC USE ONCE A DAY 100 each 3   brimonidine (ALPHAGAN) 0.2 % ophthalmic solution Place 1 drop into both eyes 2  (two) times daily.      cetirizine (ZYRTEC) 10 MG tablet Take 10 mg by mouth daily.     colchicine 0.6 MG tablet TAKE 2 TABS AT 1ST SIGN OF GOUT FLARE  FOLLOWED BY 1 TAB AFTER 1 HR NEXT DAY TAKE 1 TAB DAILY FOR 5 DAYS AS NEEDED AS DIRECTED 180 tablet 0   Dulaglutide (TRULICITY) 3 JO/8.7OM SOPN Inject 3 mg into the skin once a week. 6 mL 3   fluticasone (FLONASE) 50 MCG/ACT nasal spray      gabapentin (NEURONTIN) 300 MG capsule TAKE 2 CAPSULES BY MOUTH 3 TIMES DAILY. 540 capsule 2   glipiZIDE (GLUCOTROL XL) 5 MG 24 hr tablet Take 2 tablets by mouth in am and 1 at dinnertime 270 tablet 3   glucose blood (ONE TOUCH ULTRA TEST) test strip Use as instructed to check sugar 2 times daily 200 each 5   indomethacin (INDOCIN) 50 MG capsule Take 1 capsule (50 mg total) by mouth 2 (two) times daily with a meal. (Patient taking differently: Take 50 mg by mouth 2 (two) times daily as needed.) 60 capsule 1   insulin degludec (TRESIBA FLEXTOUCH) 200 UNIT/ML FlexTouch Pen Inject 12-14 Units into the skin daily. 9 mL 3   latanoprost (XALATAN) 0.005 % ophthalmic solution Place 1 drop into both eyes at bedtime.      lisinopril (PRINIVIL,ZESTRIL) 20 MG tablet Take 10 mg by mouth daily.     metFORMIN (GLUCOPHAGE) 1000 MG tablet Take 1,000 mg by mouth 2 (two) times daily with a meal.     Misc Natural Products (GLUCOSAMINE CHONDROITIN ADV PO) Take 2 tablets by mouth 2 (two) times daily.     Multiple Vitamin (MULTIVITAMIN) tablet Take 1 tablet by mouth daily.     mupirocin ointment (BACTROBAN) 2 % Apply to wound twice a day. 30 g 2   Omega-3 Fatty Acids (FISH OIL) 1200 MG CAPS      pravastatin (PRAVACHOL) 20 MG tablet Take 10 mg by mouth daily.      sildenafil (REVATIO) 20 MG tablet TAKE 3 TO 5 TABLETS BY MOUTH ONCE DAILY AS NEEDED 50 tablet 11   tamsulosin (FLOMAX) 0.4 MG CAPS capsule Take 1 capsule (0.4 mg total) by mouth daily. 90 capsule 3   triamcinolone ointment (KENALOG) 0.5 % Apply 1 application topically 2 (two)  times daily. 30 g 0   ULORIC 40 MG tablet Take 40 mg by mouth daily.  4   Vitamin D, Cholecalciferol, 1000 UNITS CAPS Take 1 capsule by mouth daily.     No current facility-administered medications on file prior to visit.   No Known Allergies Family History  Problem Relation Age of Onset   Colon cancer Father    Stroke Mother    Heart attack Mother    Coronary artery disease Mother    Hypertension Other        several siblings   Diabetes Other        several siblings   Coronary artery disease Brother    ALS Sister    Dementia Sister    Neuropathy Neg Hx    PE: BP 128/68 (BP Location: Left Arm, Patient Position: Sitting, Cuff Size: Normal)   Pulse 92   Ht 5' 10.5" (1.791 m)   Wt 210 lb (95.3 kg)   SpO2 99%   BMI 29.71 kg/m   Wt Readings from Last 3 Encounters:  04/26/22 210 lb (95.3 kg)  12/21/21 210 lb 9.6 oz (95.5 kg)  09/21/21 210 lb 2 oz (95.3 kg)   Constitutional: overweight, in NAD Eyes: no exophthalmos ENT: no masses palpated in neck, no cervical lymphadenopathy Cardiovascular: RRR, No MRG Respiratory: CTA B Musculoskeletal: no deformities Skin: no rashes  Neurological: no tremor with outstretched hands  ASSESSMENT: 1. DM2, insulin-dependent, uncontrolled, with complications - R foot ulcer. Nl ABIs - mild CKD  2. HL  3.  Obesity class I  PLAN:  1. Patient with longstanding,, type 2 diabetes, and daily long-acting insulin, with worsening control-HbA1c was higher, 7.3% at last visit.  Sugars were mostly at goal and only slightly above target in the morning, up to 140s, rarely higher.  He was still not taking (during the day and I advised him to try to do so.  Otherwise we did not change his regimen. -At today's visit, sugars are mostly at goal in the morning with occasional hyperglycemic spikes.  They appear improved in the last 2 weeks, up to the following days.  However, he is still not checking any blood sugars later in the day so it is difficult to make  treatment decisions.  He does mention that he feels that he cannot delay his second meal of the day, as he feels like he is dropping blood sugars.  I advised him to try to check his sugars before dyspnea, but I also vies him to decrease his glipizide dose.  8 units of Tyler Aas may be insufficient for him so I advised him to increase the dose to at least 10 units.  Otherwise, we can continue the same regimen. - I suggested to:  Patient Instructions  Please continue: - Metformin 2000 mg with dinner - Trulicity 3 mg weekly  Decrease: - Glipizide XL 5 mg in a.m. and 5 mg before dinner  Increase: - Tresiba 10 units daily  Check some sugars later in the day, including at bedtime.  Please return in 4 months with your sugar log.   - we checked his HbA1c: 7.4% (higher) - advised to check sugars at different times of the day - 1x a day, rotating check times - advised for yearly eye exams >> he is UTD - return to clinic in 4 months  2. HL -The latest lipid panel from 09/2021 reviewed: LDL improved but still above our target of less than 70, TG high, HDL low: Lab Results  Component Value Date   CHOL 166 09/21/2021   HDL 36.70 (L) 09/21/2021   LDLCALC 104 07/13/2016   LDLDIRECT 80.0 09/21/2021   TRIG 333.0 (H) 09/21/2021   CHOLHDL 5 09/21/2021  -He continues on pravastatin 10 mg daily and omega-3 fatty 1000 mg twice a day-no side effects  3. Obesity class I -Will continue Trulicity which should also help with weight loss -Lost 8 pounds before last visit but weight stable now  Philemon Kingdom, MD PhD [facility] Endocrinology

## 2022-07-20 ENCOUNTER — Other Ambulatory Visit: Payer: Self-pay | Admitting: Internal Medicine

## 2022-07-21 DIAGNOSIS — H401223 Low-tension glaucoma, left eye, severe stage: Secondary | ICD-10-CM | POA: Diagnosis not present

## 2022-07-21 DIAGNOSIS — H4720 Unspecified optic atrophy: Secondary | ICD-10-CM | POA: Diagnosis not present

## 2022-07-21 DIAGNOSIS — H5340 Unspecified visual field defects: Secondary | ICD-10-CM | POA: Diagnosis not present

## 2022-07-26 ENCOUNTER — Ambulatory Visit (INDEPENDENT_AMBULATORY_CARE_PROVIDER_SITE_OTHER): Payer: Medicare Other | Admitting: Podiatry

## 2022-07-26 ENCOUNTER — Encounter: Payer: Self-pay | Admitting: Podiatry

## 2022-07-26 DIAGNOSIS — H906 Mixed conductive and sensorineural hearing loss, bilateral: Secondary | ICD-10-CM | POA: Insufficient documentation

## 2022-07-26 DIAGNOSIS — M79676 Pain in unspecified toe(s): Secondary | ICD-10-CM

## 2022-07-26 DIAGNOSIS — L309 Dermatitis, unspecified: Secondary | ICD-10-CM | POA: Insufficient documentation

## 2022-07-26 DIAGNOSIS — B351 Tinea unguium: Secondary | ICD-10-CM

## 2022-07-26 DIAGNOSIS — E1142 Type 2 diabetes mellitus with diabetic polyneuropathy: Secondary | ICD-10-CM | POA: Diagnosis not present

## 2022-07-26 DIAGNOSIS — E875 Hyperkalemia: Secondary | ICD-10-CM | POA: Insufficient documentation

## 2022-07-26 DIAGNOSIS — Z7729 Contact with and (suspected ) exposure to other hazardous substances: Secondary | ICD-10-CM | POA: Insufficient documentation

## 2022-07-26 DIAGNOSIS — L821 Other seborrheic keratosis: Secondary | ICD-10-CM | POA: Insufficient documentation

## 2022-07-26 NOTE — Progress Notes (Signed)
He presents today chief complaint of painful elongated toenails 1 through 5 bilateral.  Objective: Pulses remain palpable toenails are long thick yellow dystrophic with mycotic nail open lesions or wounds are noted.  Assessment: Pain in limb secondary to onychomycosis.  Plan: Debridement of toenails 1 through 5 bilateral.

## 2022-08-25 ENCOUNTER — Ambulatory Visit (INDEPENDENT_AMBULATORY_CARE_PROVIDER_SITE_OTHER): Payer: Medicare Other | Admitting: Internal Medicine

## 2022-08-25 ENCOUNTER — Encounter: Payer: Self-pay | Admitting: Internal Medicine

## 2022-08-25 VITALS — BP 120/70 | HR 91 | Ht 70.5 in | Wt 217.0 lb

## 2022-08-25 DIAGNOSIS — Z7985 Long-term (current) use of injectable non-insulin antidiabetic drugs: Secondary | ICD-10-CM

## 2022-08-25 DIAGNOSIS — E1142 Type 2 diabetes mellitus with diabetic polyneuropathy: Secondary | ICD-10-CM

## 2022-08-25 DIAGNOSIS — E785 Hyperlipidemia, unspecified: Secondary | ICD-10-CM

## 2022-08-25 DIAGNOSIS — Z794 Long term (current) use of insulin: Secondary | ICD-10-CM

## 2022-08-25 DIAGNOSIS — E669 Obesity, unspecified: Secondary | ICD-10-CM

## 2022-08-25 DIAGNOSIS — E119 Type 2 diabetes mellitus without complications: Secondary | ICD-10-CM

## 2022-08-25 DIAGNOSIS — Z7984 Long term (current) use of oral hypoglycemic drugs: Secondary | ICD-10-CM | POA: Diagnosis not present

## 2022-08-25 DIAGNOSIS — E1165 Type 2 diabetes mellitus with hyperglycemia: Secondary | ICD-10-CM | POA: Diagnosis not present

## 2022-08-25 DIAGNOSIS — E66811 Obesity, class 1: Secondary | ICD-10-CM

## 2022-08-25 LAB — POCT GLYCOSYLATED HEMOGLOBIN (HGB A1C): Hemoglobin A1C: 7.2 % — AB (ref 4.0–5.6)

## 2022-08-25 NOTE — Progress Notes (Signed)
Patient ID: [name], male   DOB: 1940/12/26, 82 y.o.   MRN: [id number]  HPI: Lucas [name] is a 82 y.o.-year-old male, presenting for f/u for DM2, dx in 1998, insulin-dependent (previous Agent Orange exposure in [place]), uncontrolled, with complications (mild CKD, foot ulcer). Last visit 4 months ago.  Interim hx: No blurry vision, nausea, chest pain.   He continues to have knee pain - walks with a cane.  He tells me he has shrapnel in his cervical spine from his time in [place] in 1968.  He will have x-rays to evaluate this.  Reviewed HbA1c levels: Lab Results  Component Value Date   HGBA1C 7.4 (A) 04/26/2022   HGBA1C 7.3 (A) 12/21/2021   HGBA1C 6.9 (A) 08/17/2021   HGBA1C 7.1 (A) 04/19/2021   HGBA1C 6.9 (A) 12/14/2020   HGBA1C 6.7 (A) 07/05/2020   HGBA1C 7.8 (A) 03/01/2020   HGBA1C 7.8 (A) 10/27/2019   HGBA1C 8.2 (A) 07/21/2019   HGBA1C 7.6 (A) 03/17/2019   HGBA1C 8.1 (H) 09/24/2018   HGBA1C 7.9 (A) 03/18/2018   HGBA1C 7.6 (A) 12/07/2017   HGBA1C 7.8 08/06/2017   HGBA1C 7.4 05/10/2017   HGBA1C 8.3 11/29/2016   HGBA1C 7.7 05/08/2016   HGBA1C 7.7 12/17/2015   HGBA1C 8.8 (H) 07/28/2015   HGBA1C 7.2 02/05/2015   HGBA1C 6.7 11/05/2014   HGBA1C 9.1 (A) 06/29/2014   HGBA1C 8.7 (H) 01/13/2014   HGBA1C 7.9 (H) 07/14/2013   HGBA1C 7.6 (H) 12/31/2012   Pt is on a regimen of: - Metformin 2000 mg with dinner - Glipizide XL 10 >> 5 mg before breakfast and 5 mg before dinner - Trulicity 1.5 >> 3 mg weekly - Tresiba 12 - started 02/2020 >> 20 >> 12 >> 12-14 >> 12.5 >> he decreased by himself to 8 units daily >> increased to 10 units daily He had to stop [place] as K increased (took 1 mo, off for 1.5 mo). Invokana was not covered.  Pt checks his sugars 1-2 times a day - am:  71-131, 183 >> 87-149, 176 >> 90, 96-140, 158, 205, 212 >> 108-173, 198, 219 - 2h after b'fast: 220 >> n/c >> 233 >> n/c >> 292 >> n/c - before lunch: n/c >> 101-118 >> n/c >> 151 >> n/c >> 107, 140 -  2h after lunch:  154 >> n/c >> 114-137 >> n/c - before dinner:  95-123 >> n/c >> 85 >> n/c >> 123-138 >> n/c  >> 165 - 2h after dinner:n/c  >> 160 >> 167, 172 >> 160 >> n/c - bedtime:  109-130 >> n/c >> 137 >> n/c >> 161 - nighttime: n/c >> 165 >> n/c >> 117 >> n/c Lowest sugar was 65 >> 71>> 87 >> 90s >> 107; he has hypoglycemia awareness in the 70s. Highest sugar was 177 >> 292 >> 202 >> 212 >> 219.  Glucometer: Precision >> One Touch Ultra mini >> Livongo >> BioTel  Pt's meals are: - Breakfast:  Oatmeal, wheat English muffin  - Lunch: salad, 1/2 sandwich - Dinner: meat + veggies, added carbs back - Snacks: 2x a day, fruit bar, fresh fruit - snacking after dinner  + Mild CKD: 01/04/2022: 19/1.26, GFR 58, glucose 138, ACR 13.59 Lab Results  Component Value Date   BUN 26 (H) 09/21/2021   CREATININE 1.43 09/21/2021  04/16/2017: 15/1.2 07/13/2016: BUN/Cr 13/1.0, Glu 116, UACR 0.5 11/10/2015: EGFR 80, creatinine 1.1 04/15/2015: GFR 88.1, Cr 1.06 On lisinopril 10 mg daily.  + HL; last  set of lipids: 01/04/2022 ([facility]): 173/390/32/86 Lab Results  Component Value Date   CHOL 166 09/21/2021   HDL 36.70 (L) 09/21/2021   LDLCALC 104 07/13/2016   LDLDIRECT 80.0 09/21/2021   TRIG 333.0 (H) 09/21/2021   CHOLHDL 5 09/21/2021  04/16/2016: 179/288/44/77 04/15/2015: LDL 89 On pravastatin 10. On fish oil 1000 mg daily >> increased to 2x a day 12/2020.  - last dilated eye exam was in 07/2021: No DR, + glaucoma. 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  - + numbness and tingling in his feet.  He is on alpha-lipoic acid by neurology.  He also sees podiatry.  Latest foot exam was done by Dr. Al Corpus on 07/26/2022.  He was in a DM clinical trial before: "Jump Start"  lifestyle Research Trial. He was on the [employer] and lifestyle program in the past.  He also has a history of HTN, ED. He has gout - prev. on Alopurinol >> then  on Uloric(he  had a rash with allopurinol) He had a R foot ulcer 04/2017 and again in 11/2017. He is seen by a vascular dr.  Dolores Patty were normal then. In 11/2017 he was getting Krysexxa injections for gout.  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 He had recurrent falls before last visit and was found to have orthostatic hypotension. His BP med doses were changed.  ROS: + see HPI  I reviewed pt's medications, allergies, PMH, social hx, family hx, and changes were documented in the history of present illness. Otherwise, unchanged from my initial visit note.  Past Medical History:  Diagnosis Date   Allergic rhinitis    Diabetes mellitus type II    Diverticulosis of colon    Glaucoma    peripheral vision loss left eye.    History of agent Orange exposure    HLD (hyperlipidemia)    HTN (hypertension)    Hypertrophy of prostate without urinary obstruction and other lower urinary tract symptoms (LUTS)    Osteoarthritis    Past Surgical History:  Procedure Laterality Date   TOOTH EXTRACTION      History   Social History   Marital Status: Married    Spouse Name: N/A   Number of Children: 2   Occupational History   Retired-[employer]    Social History Main Topics   Smoking status: Never Smoker    Smokeless tobacco: Never Used   Alcohol Use: No   Drug Use: No    [place] vet- 20% disability from shrapnel wounds 1967 and 20% agent orange   Married (Widowed 1996; remarried 6/03)   Retired [employer]      Has living will   Wife, then daughter Lucas [name] will be health care POA   Would want attempts at resuscitation   Would not want feeding tube if cognitively unaware   Current Outpatient Medications on File Prior to Visit  Medication Sig Dispense Refill   albuterol (VENTOLIN HFA) 108 (90 Base) MCG/ACT inhaler Inhale 2 puffs into the lungs every 4 (four) hours as needed.     Alpha-Lipoic Acid 600 MG CAPS Take 1 capsule (600 mg total) by mouth in the morning and at bedtime. 60  capsule 3   aspirin 81 MG tablet Take 81 mg by mouth daily.     brimonidine (ALPHAGAN) 0.2 % ophthalmic solution Place 1 drop into both eyes 2 (two) times daily.  cetirizine (ZYRTEC) 10 MG tablet Take 10 mg by mouth daily.     colchicine 0.6 MG tablet TAKE 2 TABS AT 1ST SIGN OF GOUT FLARE FOLLOWED BY 1 TAB AFTER 1 HR NEXT DAY TAKE 1 TAB DAILY FOR 5 DAYS AS NEEDED AS DIRECTED 180 tablet 0   Dulaglutide (TRULICITY) 3 MG/0.5ML SOPN Inject 3 mg into the skin once a week. 6 mL 3   fluticasone (FLONASE) 50 MCG/ACT nasal spray      gabapentin (NEURONTIN) 300 MG capsule TAKE 2 CAPSULES BY MOUTH 3 TIMES DAILY. 540 capsule 2   glipiZIDE (GLUCOTROL XL) 5 MG 24 hr tablet Take 2 tablets by mouth in am and 1 at dinnertime 270 tablet 3   glucose blood (ONE TOUCH ULTRA TEST) test strip Use as instructed to check sugar 2 times daily 200 each 5   indomethacin (INDOCIN) 50 MG capsule Take 1 capsule (50 mg total) by mouth 2 (two) times daily with a meal. (Patient taking differently: Take 50 mg by mouth 2 (two) times daily as needed.) 60 capsule 1   insulin degludec (TRESIBA FLEXTOUCH) 200 UNIT/ML FlexTouch Pen Inject 12-14 Units into the skin daily. 9 mL 3   Insulin Pen Needle (BD PEN NEEDLE NANO 2ND GEN) 32G X 4 MM MISC USE ONCE DAILY 200 each 3   latanoprost (XALATAN) 0.005 % ophthalmic solution Place 1 drop into both eyes at bedtime.      lisinopril (PRINIVIL,ZESTRIL) 20 MG tablet Take 10 mg by mouth daily.     metFORMIN (GLUCOPHAGE) 1000 MG tablet Take 1,000 mg by mouth 2 (two) times daily with a meal.     Misc Natural Products (GLUCOSAMINE CHONDROITIN ADV PO) Take 2 tablets by mouth 2 (two) times daily.     Multiple Vitamin (MULTIVITAMIN) tablet Take 1 tablet by mouth daily.     mupirocin ointment (BACTROBAN) 2 % Apply to wound twice a day. 30 g 2   Omega-3 Fatty Acids (FISH OIL) 1200 MG CAPS      pravastatin (PRAVACHOL) 20 MG tablet Take 10 mg by mouth daily.      sildenafil (REVATIO) 20 MG tablet TAKE 3  TO 5 TABLETS BY MOUTH ONCE DAILY AS NEEDED 50 tablet 11   tamsulosin (FLOMAX) 0.4 MG CAPS capsule Take 1 capsule (0.4 mg total) by mouth daily. 90 capsule 3   triamcinolone ointment (KENALOG) 0.5 % Apply 1 application topically 2 (two) times daily. 30 g 0   ULORIC 40 MG tablet Take 40 mg by mouth daily.  4   Vitamin D, Cholecalciferol, 1000 UNITS CAPS Take 1 capsule by mouth daily.     No current facility-administered medications on file prior to visit.   No Known Allergies Family History  Problem Relation Age of Onset   Colon cancer Father    Stroke Mother    Heart attack Mother    Coronary artery disease Mother    Hypertension Other        several siblings   Diabetes Other        several siblings   Coronary artery disease Brother    ALS Sister    Dementia Sister    Neuropathy Neg Hx    PE: BP 120/70 (BP Location: Right Arm, Patient Position: Sitting, Cuff Size: Normal)   Pulse 91   Ht 5' 10.5" (1.791 m)   Wt 217 lb (98.4 kg)   SpO2 96%   BMI 30.70 kg/m   Wt Readings from Last 3 Encounters:  08/25/22 217  lb (98.4 kg)  04/26/22 210 lb (95.3 kg)  12/21/21 210 lb 9.6 oz (95.5 kg)   Constitutional: overweight, in NAD Eyes: no exophthalmos ENT: no masses palpated in neck, no cervical lymphadenopathy Cardiovascular: RRR, No MRG Respiratory: CTA B Musculoskeletal: no deformities Skin: no rashes Neurological: no tremor with outstretched hands  ASSESSMENT: 1. DM2, insulin-dependent, uncontrolled, with complications - R foot ulcer. Nl ABIs - mild CKD  2. HL  3.  Obesity class I  PLAN:  1. Patient with longstanding, uncontrolled, type 2 diabetes, on daily long-acting insulin, weekly GLP-1 receptor agonist and oral regimen, with an increased HbA1c at last visit, 7.4%.  At that time, he was checking sugars only in the morning and they were mostly at goal with occasional hyperglycemic spikes.  They appears to be improved in the previous 2 weeks.  However, we discussed about  the importance of checking some sugars later in the day, rotating check times.  We did increase his Tresiba dose and also I advised him to reduce glipizide as he felt that he had low blood sugars if he delays the second meal of the day.  We otherwise continued the same doses of metformin and Trulicity. -At today's visit, he is still not taking sugars after dinner or at bedtime.  He had very few values checked around lunchtime or dinnertime.  Sugars in the morning are higher in the last week, but he does mention that he has been traveling for the last 2 weeks.  Reviewing his logs, indeed, sugars were better before the last 2 weeks.  We again discussed about the importance of checking sugars at bedtime.  I did advise him that he will need to start improving his dinners, if sugars are high when he goes to bed.  However, if the sugars increase from bedtime to morning, we will need to increase his Tresiba dose. -At this visit, we discussed about metformin per his wife's concerns.  We discussed about the benefits of metformin and the fact that the negative publicity around metformin is unfounded. - I suggested to:  Patient Instructions  Please continue: - Metformin 2000 mg with dinner - Glipizide XL 5 mg in a.m. and 5 mg before dinner - Trulicity 3 mg weekly - Tresiba 10 units daily  Check some sugars later in the day, including at bedtime.  Please return in 4 months with your sugar log.   - we checked his HbA1c: 7.2% (lower) - advised to check sugars at different times of the day - 1x a day, rotating check times - advised for yearly eye exams >> he is UTD - return to clinic in 4 months  2. HL -Reviewed latest lipid panel from 01/04/2022: Triglycerides elevated, LDL improved but still above goal -He continues on pravastatin 10 mg daily and omega-3 fatty acids 1000 mg twice a day.  No side effects.  3. Obesity class I -Will continue Trulicity 3 mg weekly, which should also help with weight  loss -Weight was stable at last visit, previously lost 8 pounds -he gained 7 pounds since last visit  Carlus Pavlov, MD PhD [facility] Endocrinology

## 2022-08-25 NOTE — Patient Instructions (Addendum)
Please continue: - Metformin 2000 mg with dinner - Glipizide XL 5 mg in a.m. and 5 mg before dinner - Trulicity 3 mg weekly - Tresiba 10 units daily  Check some sugars later in the day, including at bedtime.  Please return in 4 months with your sugar log.

## 2022-09-13 ENCOUNTER — Other Ambulatory Visit: Payer: Self-pay | Admitting: Internal Medicine

## 2022-09-13 DIAGNOSIS — E1142 Type 2 diabetes mellitus with diabetic polyneuropathy: Secondary | ICD-10-CM

## 2022-09-23 ENCOUNTER — Other Ambulatory Visit: Payer: Self-pay | Admitting: Internal Medicine

## 2022-09-23 DIAGNOSIS — E1142 Type 2 diabetes mellitus with diabetic polyneuropathy: Secondary | ICD-10-CM

## 2022-09-28 ENCOUNTER — Telehealth: Payer: Self-pay

## 2022-09-28 MED ORDER — SEMAGLUTIDE (1 MG/DOSE) 4 MG/3ML ~~LOC~~ SOPN: 1.0000 mg | PEN_INJECTOR | SUBCUTANEOUS | 1 refills | Status: DC

## 2022-09-28 NOTE — Telephone Encounter (Signed)
J, we can send Ozempic 1 mg weekly.  I would suggest to start with 0.5 mg weekly (36 clicks on the pen) for 1 week and to increase the dose afterwards if he tolerates it well.

## 2022-09-28 NOTE — Telephone Encounter (Signed)
Patient called and states that his Trulicity is on back order. What can we switch him to?

## 2022-10-24 ENCOUNTER — Telehealth: Payer: Self-pay

## 2022-10-24 NOTE — Telephone Encounter (Signed)
Patient has been notified and he states tha when he gets his refill he will come here for a demonstration to make sure he is giving himself the correct amount of medication.

## 2022-10-24 NOTE — Telephone Encounter (Signed)
Patient called stating that he has having GI upset since starting the Ozempic 1 mg. He did start off with the 0.5 mg as advised and increased to 1 mg but he still had this same side effect with each dosage. Please advise,

## 2022-10-25 ENCOUNTER — Ambulatory Visit (INDEPENDENT_AMBULATORY_CARE_PROVIDER_SITE_OTHER): Payer: Medicare Other | Admitting: Podiatry

## 2022-10-25 DIAGNOSIS — B351 Tinea unguium: Secondary | ICD-10-CM | POA: Diagnosis not present

## 2022-10-25 DIAGNOSIS — M79676 Pain in unspecified toe(s): Secondary | ICD-10-CM

## 2022-10-25 DIAGNOSIS — E1142 Type 2 diabetes mellitus with diabetic polyneuropathy: Secondary | ICD-10-CM | POA: Diagnosis not present

## 2022-10-25 NOTE — Progress Notes (Signed)
He presents today chief complaint of painful elongated toenails 1 through 5 bilateral.  Objective: Pulses are palpable.  Neurologic sensorium is unchanged toenails are long and mycotic tender on palpation as well as debridement.  Assessment: Pain in limb secondary to onychomycosis.  Diabetes mellitus with diabetic peripheral neuropathy.  Plan: Debrided toenails 1 through 5 bilateral.

## 2022-11-21 ENCOUNTER — Ambulatory Visit (INDEPENDENT_AMBULATORY_CARE_PROVIDER_SITE_OTHER): Payer: Medicare Other | Admitting: Internal Medicine

## 2022-11-21 ENCOUNTER — Encounter: Payer: Self-pay | Admitting: Internal Medicine

## 2022-11-21 VITALS — BP 118/68 | HR 65 | Temp 98.3°F | Ht 70.5 in | Wt 216.0 lb

## 2022-11-21 DIAGNOSIS — R194 Change in bowel habit: Secondary | ICD-10-CM | POA: Diagnosis not present

## 2022-11-21 NOTE — Progress Notes (Signed)
Subjective:    Patient ID: Lucas [name], male    DOB: [birth year], 82 y.o.   MRN: [id number]  HPI Here due to bowel changes  Over the past couple of months---will have AM coffee, and then gets fecal urgency Will be loose This is not his usual style--would have to take something to go (might go 3-4 days before going) Feels this started when changing from trulicity to semaglutide Dr Elvera Lennox told him to skip 3 weeks Sugars up and down---off this  No incontinence Occasionally will have to go a second time--can be urgent No blood  Current Outpatient Medications on File Prior to Visit  Medication Sig Dispense Refill   albuterol (VENTOLIN HFA) 108 (90 Base) MCG/ACT inhaler Inhale 2 puffs into the lungs every 4 (four) hours as needed.     Alpha-Lipoic Acid 600 MG CAPS Take 1 capsule (600 mg total) by mouth in the morning and at bedtime. 60 capsule 3   aspirin 81 MG tablet Take 81 mg by mouth daily.     brimonidine (ALPHAGAN) 0.2 % ophthalmic solution Place 1 drop into both eyes 2 (two) times daily.      cetirizine (ZYRTEC) 10 MG tablet Take 10 mg by mouth daily.     colchicine 0.6 MG tablet TAKE 2 TABS AT 1ST SIGN OF GOUT FLARE FOLLOWED BY 1 TAB AFTER 1 HR NEXT DAY TAKE 1 TAB DAILY FOR 5 DAYS AS NEEDED AS DIRECTED 180 tablet 0   fluticasone (FLONASE) 50 MCG/ACT nasal spray      gabapentin (NEURONTIN) 300 MG capsule TAKE 2 CAPSULES BY MOUTH 3 TIMES DAILY. 540 capsule 2   glipiZIDE (GLUCOTROL XL) 5 MG 24 hr tablet TAKE 1 TABLET BY MOUTH IN THE MORNING AND 1 TAB AT DINNERTIME 180 tablet 1   glucose blood (ONE TOUCH ULTRA TEST) test strip Use as instructed to check sugar 2 times daily 200 each 5   indomethacin (INDOCIN) 50 MG capsule Take 1 capsule (50 mg total) by mouth 2 (two) times daily with a meal. (Patient taking differently: Take 50 mg by mouth 2 (two) times daily as needed.) 60 capsule 1   insulin degludec (TRESIBA FLEXTOUCH) 200 UNIT/ML FlexTouch Pen Inject 12-14 Units into the skin  daily. 9 mL 3   Insulin Pen Needle (BD PEN NEEDLE NANO 2ND GEN) 32G X 4 MM MISC USE ONCE DAILY 200 each 3   latanoprost (XALATAN) 0.005 % ophthalmic solution Place 1 drop into both eyes at bedtime.      lisinopril (PRINIVIL,ZESTRIL) 20 MG tablet Take 10 mg by mouth daily.     metFORMIN (GLUCOPHAGE) 1000 MG tablet Take 1,000 mg by mouth 2 (two) times daily with a meal.     Misc Natural Products (GLUCOSAMINE CHONDROITIN ADV PO) Take 2 tablets by mouth 2 (two) times daily.     Multiple Vitamin (MULTIVITAMIN) tablet Take 1 tablet by mouth daily.     mupirocin ointment (BACTROBAN) 2 % Apply to wound twice a day. 30 g 2   Omega-3 Fatty Acids (FISH OIL) 1200 MG CAPS      pravastatin (PRAVACHOL) 20 MG tablet Take 10 mg by mouth daily.      sildenafil (REVATIO) 20 MG tablet TAKE 3 TO 5 TABLETS BY MOUTH ONCE DAILY AS NEEDED 50 tablet 11   tamsulosin (FLOMAX) 0.4 MG CAPS capsule Take 1 capsule (0.4 mg total) by mouth daily. 90 capsule 3   triamcinolone ointment (KENALOG) 0.5 % Apply 1 application topically 2 (two) times  daily. 30 g 0   ULORIC 40 MG tablet Take 40 mg by mouth daily.  4   Vitamin D, Cholecalciferol, 1000 UNITS CAPS Take 1 capsule by mouth daily.     Semaglutide, 1 MG/DOSE, 4 MG/3ML SOPN Inject 1 mg as directed once a week. (Patient not taking: Reported on 11/21/2022) 3 mL 1   No current facility-administered medications on file prior to visit.    No Known Allergies  Past Medical History:  Diagnosis Date   Allergic rhinitis    Diabetes mellitus type II    Diverticulosis of colon    Glaucoma    peripheral vision loss left eye.    History of agent Orange exposure    HLD (hyperlipidemia)    HTN (hypertension)    Hypertrophy of prostate without urinary obstruction and other lower urinary tract symptoms (LUTS)    Osteoarthritis     Past Surgical History:  Procedure Laterality Date   TOOTH EXTRACTION      Family History  Problem Relation Age of Onset   Colon cancer Father     Stroke Mother    Heart attack Mother    Coronary artery disease Mother    Hypertension Other        several siblings   Diabetes Other        several siblings   Coronary artery disease Brother    ALS Sister    Dementia Sister    Neuropathy Neg Hx     Social History   Socioeconomic History   Marital status: Married    Spouse name: Not on file   Number of children: Not on file   Years of education: Not on file   Highest education level: Not on file  Occupational History   Occupation: Retired-[employer]  Tobacco Use   Smoking status: Never   Smokeless tobacco: Never  Vaping Use   Vaping status: Never Used  Substance and Sexual Activity   Alcohol use: No    Alcohol/week: 0.0 standard drinks of alcohol   Drug use: No   Sexual activity: Not on file  Other Topics Concern   Not on file  Social History Narrative   [place] vet- 20% disability from shrapnel wounds 1967 and 20% agent orange   Married (Widowed 1996; remarried 6/03)   Retired [employer]      Has living will   Wife, then daughter Billie Lade will be health care POA   Would want attempts at resuscitation   Would not want feeding tube if cognitively unaware   Social Determinants of Health   Financial Resource Strain: Not on file  Food Insecurity: Not on file  Transportation Needs: Not on file  Physical Activity: Not on file  Stress: Not on file  Social Connections: Unknown (08/29/2022)   Received from [facility]   Social Network    Social Network: Not on file  Intimate Partner Violence: Unknown (08/29/2022)   [facility] from [facility]   HITS    Physically Hurt: Not on file    Insult or Talk Down To: Not on file    Threaten Physical Harm: Not on file    Scream or Curse: Not on file   Review of Systems weight is stable No abdominal pain--but has AM discomfort which is better now     Objective:   Physical Exam Constitutional:      Appearance: Normal appearance.  Pulmonary:      Effort: Pulmonary effort is normal.  Breath sounds: Normal breath sounds. No wheezing or rales.  Abdominal:     General: Bowel sounds are normal. There is no distension.     Palpations: Abdomen is soft.     Tenderness: There is no abdominal tenderness. There is no guarding or rebound.  Musculoskeletal:     Cervical back: Neck supple.  Neurological:     Mental Status: He is alert.            Assessment & Plan:

## 2022-11-21 NOTE — Assessment & Plan Note (Signed)
Changes certainly seem related to change from trulicity to ozempic Curious that there has been no change off the ozmepic for 3 weeks though Discussed that this pattern is not pathologic (depending on the degree of urgency) Probably best for him to see if he can tolerate a lower dose (0.5mg  weekly) or go back to trulicity when available Diabetes control has been pretty good Seeing Dr Elvera Lennox next month

## 2022-12-12 DIAGNOSIS — H401211 Low-tension glaucoma, right eye, mild stage: Secondary | ICD-10-CM | POA: Diagnosis not present

## 2022-12-20 ENCOUNTER — Other Ambulatory Visit: Payer: Self-pay | Admitting: Internal Medicine

## 2022-12-20 NOTE — Telephone Encounter (Signed)
Rx sent electronically. Pt needs CPE with Dr Alphonsus Sias. Please help him get scheduled. Thanks

## 2022-12-20 NOTE — Telephone Encounter (Signed)
Spoke to pt, scheduled cpe for 01/10/23.

## 2022-12-24 ENCOUNTER — Other Ambulatory Visit: Payer: Self-pay

## 2022-12-24 ENCOUNTER — Inpatient Hospital Stay: Admission: RE | Admit: 2022-12-24 | Payer: Medicare Other | Source: Ambulatory Visit

## 2022-12-24 ENCOUNTER — Ambulatory Visit
Admission: EM | Admit: 2022-12-24 | Discharge: 2022-12-24 | Disposition: A | Discharge status: Other Acute Inpatient Hospital | Payer: Medicare Other

## 2022-12-24 ENCOUNTER — Emergency Department
Admission: EM | Admit: 2022-12-24 | Discharge: 2022-12-24 | Disposition: A | Discharge status: Home / Self Care | Payer: Medicare Other | Attending: Emergency Medicine | Admitting: Emergency Medicine

## 2022-12-24 ENCOUNTER — Encounter: Payer: Self-pay | Admitting: Emergency Medicine

## 2022-12-24 DIAGNOSIS — R5383 Other fatigue: Secondary | ICD-10-CM | POA: Diagnosis present

## 2022-12-24 DIAGNOSIS — U071 COVID-19: Secondary | ICD-10-CM

## 2022-12-24 LAB — COMPREHENSIVE METABOLIC PANEL
ALT: 32 U/L (ref 0–44)
AST: 38 U/L (ref 15–41)
Albumin: 3.8 g/dL (ref 3.5–5.0)
Alkaline Phosphatase: 38 U/L (ref 38–126)
Anion gap: 9 (ref 5–15)
BUN: 33 mg/dL — ABNORMAL HIGH (ref 8–23)
CO2: 25 mmol/L (ref 22–32)
Calcium: 8.1 mg/dL — ABNORMAL LOW (ref 8.9–10.3)
Chloride: 102 mmol/L (ref 98–111)
Creatinine, Ser: 1.29 mg/dL — ABNORMAL HIGH (ref 0.61–1.24)
GFR, Estimated: 56 mL/min — ABNORMAL LOW (ref >60)
Glucose, Bld: 216 mg/dL — ABNORMAL HIGH (ref 70–99)
Potassium: 4.8 mmol/L (ref 3.5–5.1)
Sodium: 136 mmol/L (ref 135–145)
Total Bilirubin: 0.5 mg/dL (ref 0.3–1.2)
Total Protein: 6.6 g/dL (ref 6.5–8.1)

## 2022-12-24 LAB — CBC
HCT: 39.7 % (ref 39.0–52.0)
Hemoglobin: 12 g/dL — ABNORMAL LOW (ref 13.0–17.0)
MCH: 27.8 pg (ref 26.0–34.0)
MCHC: 30.2 g/dL (ref 30.0–36.0)
MCV: 91.9 fL (ref 80.0–100.0)
Platelets: 175 10*3/uL (ref 150–400)
RBC: 4.32 MIL/uL (ref 4.22–5.81)
RDW: 13.6 % (ref 11.5–15.5)
WBC: 3.1 10*3/uL — ABNORMAL LOW (ref 4.0–10.5)
nRBC: 0 % (ref 0.0–0.2)

## 2022-12-24 MED ORDER — SODIUM CHLORIDE 0.9 % IV BOLUS
500.0000 mL | Freq: Once | INTRAVENOUS | Status: AC
  Administered 2022-12-24: 500 mL via INTRAVENOUS

## 2022-12-24 MED ORDER — NIRMATRELVIR/RITONAVIR (PAXLOVID)TABLET: 3.0000 | ORAL_TABLET | Freq: Two times a day (BID) | ORAL | 0 refills | Status: AC

## 2022-12-24 NOTE — ED Triage Notes (Signed)
Provider at bedside.  Pt will be sent to ER r/t bp and symptoms.

## 2022-12-24 NOTE — ED Provider Notes (Signed)
[facility] Provider Note    Event Date/Time   First MD Initiated Contact with Patient 12/24/22 [phone]     (approximate)   History   COVID+   HPI  Lucas [name] is a 82 y.o. male who presents with positive COVID, was seen in urgent care prior, referred to emergency department because of fatigue, soft blood pressure.  Patient reports he feels well and just wants to be prescribed Paxlovid.  No dizziness reported     Physical Exam   Triage Vital Signs: ED Triage Vitals [12/24/22 0856]  Encounter Vitals Group     BP 99/60     Systolic BP Percentile      Diastolic BP Percentile      Pulse Rate 76     Resp 19     Temp 98.4 F (36.9 C)     Temp Source Oral     SpO2 98 %     Weight 97 kg (213 lb 13.5 oz)     Height 1.803 m (5\' 11" )     Head Circumference      Peak Flow      Pain Score 0     Pain Loc      Pain Education      Exclude from Growth Chart     Most recent vital signs: Vitals:   12/24/22 0856  BP: 99/60  Pulse: 76  Resp: 19  Temp: 98.4 F (36.9 C)  SpO2: 98%     General: Awake, no distress.  CV:  Good peripheral perfusion.  Resp:  Normal effort.  Abd:  No distentioN   ED Results / Procedures / Treatments   Labs (all labs ordered are listed, but only abnormal results are displayed) Labs Reviewed  COMPREHENSIVE METABOLIC PANEL - Abnormal; Notable for the following components:      Result Value   Glucose, Bld 216 (*)    BUN 33 (*)    Creatinine, Ser 1.29 (*)    Calcium 8.1 (*)    GFR, Estimated 56 (*)    All other components within normal limits  CBC - Abnormal; Notable for the following components:   WBC 3.1 (*)    Hemoglobin 12.0 (*)    All other components within normal limits     EKG     RADIOLOGY     PROCEDURES:  Critical Care performed:   Procedures   MEDICATIONS ORDERED IN ED: Medications  sodium chloride 0.9 % bolus 500 mL (0 mLs Intravenous Stopped 12/24/22 1110)     IMPRESSION /  MDM / ASSESSMENT AND PLAN / ED COURSE  I reviewed the triage vital signs and the nursing notes. Patient's presentation is most consistent with acute illness / injury with system symptoms.  Patient presents with COVID, blood pressure of 99/60, not far off his baseline, heart rate normal.  Well-appearing overall.  Labs checked to evaluate GFR for Paxlovid  Been IV fluids as well.  GFR of 56, within 3 days of diagnosis/symptom onset, Paxlovid prescription provided, appropriate for discharge at this time        FINAL CLINICAL IMPRESSION(S) / ED DIAGNOSES   Final diagnoses:  COVID-19     Rx / DC Orders   ED Discharge Orders          Ordered    nirmatrelvir/ritonavir (PAXLOVID) 20 x 150 MG & 10 x 100MG  TABS  2 times daily        12/24/22 1101  Note:  This document was prepared using Dragon voice recognition software and may include unintentional dictation errors.   Jene Every, MD 12/24/22 [phone]

## 2022-12-24 NOTE — ED Provider Notes (Signed)
[name]    CSN: [id number] Arrival date & time: 12/24/22  0805      History   Chief Complaint Chief Complaint  Patient presents with   wants medication for covid    HPI Lucas [name] is a 82 y.o. male.   82 year old male pt, Lucas [name], presents to urgent care for +Covid test Friday. Pt states he feels weak, urinary frequency, dry mouth. BP is soft in office x 2, recommend pt go to ER for further evaluation.   The history is provided by the patient. No language interpreter was used.    Past Medical History:  Diagnosis Date   Allergic rhinitis    Diabetes mellitus type II    Diverticulosis of colon    Glaucoma    peripheral vision loss left eye.    History of agent Orange exposure    HLD (hyperlipidemia)    HTN (hypertension)    Hypertrophy of prostate without urinary obstruction and other lower urinary tract symptoms (LUTS)    Osteoarthritis     Patient Active Problem List   Diagnosis Date Noted   Positive self-administered antigen test for COVID-19 12/24/2022   Change in bowel habits 11/21/2022   Contact with and (suspected) exposure to other hazardous substances 07/26/2022   Dermatitis, unspecified 07/26/2022   Exposure to potentially hazardous substance 07/26/2022   Hyperkalemia 07/26/2022   Mixed conductive and sensorineural hearing loss, bilateral 07/26/2022   Other seborrheic keratosis 07/26/2022   Benign neoplasm of colon 07/19/2020   Chronic sinusitis 07/19/2020   Glaucoma 07/19/2020   Diabetic polyneuropathy associated with type 2 diabetes mellitus (HCC) 10/27/2019   Sensory ataxia 10/27/2019   Poorly controlled type 2 diabetes mellitus with peripheral neuropathy (HCC) 09/02/2019   Obesity, Class I, BMI 30-34.9 12/07/2017   DDD (degenerative disc disease), lumbar 05/16/2016   Advance directive discussed with patient 07/28/2015   Gouty arthropathy, chronic, without tophi 07/08/2015   Hypertension 07/08/2015   Chronic gout,  unspecified, with tophus (tophi) 07/08/2015   Obstructive sleep apnea 07/22/2014   Routine general medical examination at a health care facility 06/06/2011   BPH with obstruction/lower urinary tract symptoms    Generalized osteoarthritis of multiple sites 09/20/2006   Hyperlipidemia 09/17/2006   ALLERGIC RHINITIS 09/17/2006   DIVERTICULOSIS, COLON 09/17/2006    Past Surgical History:  Procedure Laterality Date   TOOTH EXTRACTION         Home Medications    Prior to Admission medications   Medication Sig Start Date End Date Taking? Authorizing Provider  albuterol (VENTOLIN HFA) 108 (90 Base) MCG/ACT inhaler Inhale 2 puffs into the lungs every 4 (four) hours as needed.    [provider]  Alpha-Lipoic Acid 600 MG CAPS Take 1 capsule (600 mg total) by mouth in the morning and at bedtime. 10/27/19   Anson Fret, MD  aspirin 81 MG tablet Take 81 mg by mouth daily.    [provider]  brimonidine (ALPHAGAN) 0.2 % ophthalmic solution Place 1 drop into both eyes 2 (two) times daily.  06/03/11   [provider]  cetirizine (ZYRTEC) 10 MG tablet Take 10 mg by mouth daily.    [provider]  colchicine 0.6 MG tablet TAKE 2 TABS AT 1ST SIGN OF GOUT FLARE FOLLOWED BY 1 TAB AFTER 1 HR NEXT DAY TAKE 1 TAB DAILY FOR 5 DAYS AS NEEDED AS DIRECTED 04/11/22   Karie Schwalbe, MD  fluticasone (FLONASE) 50 MCG/ACT nasal spray  08/14/14  [provider]  gabapentin (NEURONTIN) 300 MG capsule TAKE 2 CAPSULES BY MOUTH 3 TIMES DAILY. 01/09/22   Karie Schwalbe, MD  glipiZIDE (GLUCOTROL XL) 5 MG 24 hr tablet TAKE 1 TABLET BY MOUTH IN THE MORNING AND 1 TAB AT DINNERTIME 09/25/22   Carlus Pavlov, MD  glucose blood (ONE TOUCH ULTRA TEST) test strip Use as instructed to check sugar 2 times daily 09/22/16   Carlus Pavlov, MD  indomethacin (INDOCIN) 50 MG capsule Take 1 capsule (50 mg total) by mouth 2 (two) times daily with a meal. Patient taking differently:  Take 50 mg by mouth 2 (two) times daily as needed. 06/28/15   Hyatt, Max T, DPM  insulin degludec (TRESIBA FLEXTOUCH) 200 UNIT/ML FlexTouch Pen Inject 12-14 Units into the skin daily. 08/17/21   Carlus Pavlov, MD  Insulin Pen Needle (BD PEN NEEDLE NANO 2ND GEN) 32G X 4 MM MISC USE ONCE DAILY 07/20/22   Carlus Pavlov, MD  latanoprost (XALATAN) 0.005 % ophthalmic solution Place 1 drop into both eyes at bedtime.  04/01/11   [provider]  lisinopril (PRINIVIL,ZESTRIL) 20 MG tablet Take 10 mg by mouth daily.    [provider]  metFORMIN (GLUCOPHAGE) 1000 MG tablet Take 1,000 mg by mouth 2 (two) times daily with a meal.    [provider]  Misc Natural Products (GLUCOSAMINE CHONDROITIN ADV PO) Take 2 tablets by mouth 2 (two) times daily.    [provider]  Multiple Vitamin (MULTIVITAMIN) tablet Take 1 tablet by mouth daily.    [provider]  mupirocin ointment (BACTROBAN) 2 % Apply to wound twice a day. 02/14/17   Hyatt, Max T, DPM  Omega-3 Fatty Acids (FISH OIL) 1200 MG CAPS  02/08/18   [provider]  pravastatin (PRAVACHOL) 20 MG tablet Take 10 mg by mouth daily.  05/22/11   [provider]  Semaglutide, 1 MG/DOSE, 4 MG/3ML SOPN Inject 1 mg as directed once a week. Patient not taking: Reported on 11/21/2022 09/28/22   Carlus Pavlov, MD  sildenafil (REVATIO) 20 MG tablet TAKE 3 TO 5 TABLETS BY MOUTH ONCE DAILY AS NEEDED 06/19/17   Tillman Abide I, MD  tamsulosin (FLOMAX) 0.4 MG CAPS capsule TAKE 1 CAPSULE BY MOUTH EVERY DAY 12/20/22   Karie Schwalbe, MD  triamcinolone ointment (KENALOG) 0.5 % Apply 1 application topically 2 (two) times daily. 11/14/16   Jarold Motto, PA  ULORIC 40 MG tablet Take 40 mg by mouth daily. 11/22/16   [provider]  Vitamin D, Cholecalciferol, 1000 UNITS CAPS Take 1 capsule by mouth daily.    [provider]    Family History Family History  Problem Relation Age of Onset    Colon cancer Father    Stroke Mother    Heart attack Mother    Coronary artery disease Mother    Hypertension Other        several siblings   Diabetes Other        several siblings   Coronary artery disease Brother    ALS Sister    Dementia Sister    Neuropathy Neg Hx     Social History Social History   Tobacco Use   Smoking status: Never   Smokeless tobacco: Never  Vaping Use   Vaping status: Never Used  Substance Use Topics   Alcohol use: No    Alcohol/week: 0.0 standard drinks of alcohol   Drug use: No     Allergies   Patient has  no known allergies.   Review of Systems Review of Systems  Constitutional:  Positive for fatigue.  Genitourinary:  Positive for frequency.  Neurological:  Positive for weakness.  All other systems reviewed and are negative.    Physical Exam Triage Vital Signs ED Triage Vitals  Encounter Vitals Group     BP 12/24/22 0815 (!) 91/57     Systolic BP Percentile --      Diastolic BP Percentile --      Pulse Rate 12/24/22 0813 77     Resp 12/24/22 0813 20     Temp 12/24/22 0813 99.9 F (37.7 C)     Temp Source 12/24/22 0813 Oral     SpO2 12/24/22 0813 95 %     Weight 12/24/22 0813 215 lb (97.5 kg)     Height 12/24/22 0813 5\' 11"  (1.803 m)     Head Circumference --      Peak Flow --      Pain Score 12/24/22 0813 0     Pain Loc --      Pain Education --      Exclude from Growth Chart --    No data found.  Updated Vital Signs BP (!) 91/57   Pulse 77   Temp 99.9 F (37.7 C) (Oral)   Resp 20   Ht 5\' 11"  (1.803 m)   Wt 215 lb (97.5 kg)   SpO2 95%   BMI 29.99 kg/m   Visual Acuity Right Eye Distance:   Left Eye Distance:   Bilateral Distance:    Right Eye Near:   Left Eye Near:    Bilateral Near:     Physical Exam Vitals and nursing note reviewed.  Constitutional:      Appearance: He is well-developed.  HENT:     Head: Normocephalic.     Mouth/Throat:     Mouth: Mucous membranes are dry.  Cardiovascular:      Rate and Rhythm: Normal rate.  Pulmonary:     Effort: Pulmonary effort is normal.     Breath sounds: Normal breath sounds and air entry.  Neurological:     General: No focal deficit present.     Mental Status: He is alert and oriented to person, place, and time.     GCS: GCS eye subscore is 4. GCS verbal subscore is 5. GCS motor subscore is 6.  Psychiatric:        Attention and Perception: Attention normal.        Mood and Affect: Mood normal.        Speech: Speech normal.        Behavior: Behavior normal. Behavior is cooperative.      UC Treatments / Results  Labs (all labs ordered are listed, but only abnormal results are displayed) Labs Reviewed - No data to display  EKG   Radiology No results found.  Procedures Procedures (including critical care time)  Medications Ordered in UC Medications - No data to display  Initial Impression / Assessment and Plan / UC Course  I have reviewed the triage vital signs and the nursing notes.  Pertinent labs & imaging results that were available during my care of the patient were reviewed by me and considered in my medical decision making (see chart for details).     Ddx: Covid, electrolyte imbalance,dehydration Final Clinical Impressions(s) / UC Diagnoses   Final diagnoses:  Positive self-administered antigen test for COVID-19     Discharge Instructions      Go  to the ER for further evaluation of BP/ symptoms management.    ED Prescriptions   None    PDMP not reviewed this encounter.   Clancy Gourd, NP 12/24/22 [phone]

## 2022-12-24 NOTE — ED Triage Notes (Addendum)
Pt presents to ED from UC due to BP issues. Pt states COVID test Friday and was +. Pt is NAD. Pt states HX of hypertension and states he took his meds today. Pt ambulatory with steady gait to triage. Pt denies dizziness, SOB or CP to this RN.   Pt denies any concerns but wants "that med I had before when I had COVID".

## 2022-12-24 NOTE — ED Triage Notes (Addendum)
Pt reports his wife tested positive for covid on Thursday and so he took a test Friday and was positive.  Pt denies symptoms but does have low grade temp currently.  Only complaint is dry mouth. Bp soft X 2 .  Pt reports did have episode where up peeing all night wednesday and was weak walking back but reports has been fine since.

## 2022-12-24 NOTE — Discharge Instructions (Addendum)
Go to the ER for further evaluation of BP/ symptoms management.

## 2022-12-27 ENCOUNTER — Telehealth (INDEPENDENT_AMBULATORY_CARE_PROVIDER_SITE_OTHER): Payer: Medicare Other | Admitting: Internal Medicine

## 2022-12-27 ENCOUNTER — Encounter: Payer: Self-pay | Admitting: Internal Medicine

## 2022-12-27 DIAGNOSIS — Z794 Long term (current) use of insulin: Secondary | ICD-10-CM

## 2022-12-27 DIAGNOSIS — E669 Obesity, unspecified: Secondary | ICD-10-CM | POA: Diagnosis not present

## 2022-12-27 DIAGNOSIS — E785 Hyperlipidemia, unspecified: Secondary | ICD-10-CM | POA: Diagnosis not present

## 2022-12-27 DIAGNOSIS — Z7985 Long-term (current) use of injectable non-insulin antidiabetic drugs: Secondary | ICD-10-CM

## 2022-12-27 DIAGNOSIS — E1142 Type 2 diabetes mellitus with diabetic polyneuropathy: Secondary | ICD-10-CM | POA: Diagnosis not present

## 2022-12-27 DIAGNOSIS — E1165 Type 2 diabetes mellitus with hyperglycemia: Secondary | ICD-10-CM

## 2022-12-27 DIAGNOSIS — Z7984 Long term (current) use of oral hypoglycemic drugs: Secondary | ICD-10-CM

## 2022-12-27 MED ORDER — TRULICITY 1.5 MG/0.5ML ~~LOC~~ SOAJ
1.5000 mg | SUBCUTANEOUS | 5 refills | Status: DC
Start: 2022-12-27 — End: 2023-06-15

## 2022-12-27 NOTE — Progress Notes (Signed)
Patient ID: Lucas [name], male   DOB: [birth year], 82 y.o.   MRN: [id number]  Patient location: Home My location: Office Persons participating in the virtual visit: patient, provider  Referring Provider: Karie Schwalbe, MD  I connected with the patient on 12/27/22 at 10:40 AM EDT by a video enabled telemedicine application and verified that I am speaking with the correct person.   I discussed the limitations of evaluation and management by telemedicine and the availability of in person appointments. The patient expressed understanding and agreed to proceed.   Details of the encounter are shown below.  HPI: Lucas [name] is a 82 y.o.-year-old male, presenting for f/u for DM2, dx in 1998, insulin-dependent (previous Agent Orange exposure in [place]), uncontrolled, with complications (mild CKD, foot ulcer). Last visit 4 months ago.  Interim hx: No blurry vision, nausea, chest pain.   He continues to have knee pain. He was dx'ed with Covid 19 >> no cough, feels fairly well.  Reviewed HbA1c levels: 12/15/2022: HbA1c 8.2% Lab Results  Component Value Date   HGBA1C 7.2 (A) 08/25/2022   HGBA1C 7.4 (A) 04/26/2022   HGBA1C 7.3 (A) 12/21/2021   HGBA1C 6.9 (A) 08/17/2021   HGBA1C 7.1 (A) 04/19/2021   HGBA1C 6.9 (A) 12/14/2020   HGBA1C 6.7 (A) 07/05/2020   HGBA1C 7.8 (A) 03/01/2020   HGBA1C 7.8 (A) 10/27/2019   HGBA1C 8.2 (A) 07/21/2019   HGBA1C 7.6 (A) 03/17/2019   HGBA1C 8.1 (H) 09/24/2018   HGBA1C 7.9 (A) 03/18/2018   HGBA1C 7.6 (A) 12/07/2017   HGBA1C 7.8 08/06/2017   HGBA1C 7.4 05/10/2017   HGBA1C 8.3 11/29/2016   HGBA1C 7.7 05/08/2016   HGBA1C 7.7 12/17/2015   HGBA1C 8.8 (H) 07/28/2015   HGBA1C 7.2 02/05/2015   HGBA1C 6.7 11/05/2014   HGBA1C 9.1 (A) 06/29/2014   HGBA1C 8.7 (H) 01/13/2014   HGBA1C 7.9 (H) 07/14/2013   Pt is on a regimen of: - Metformin 2000 mg with dinner - Glipizide XL 10 >> 5 mg before breakfast and 5 mg before dinner - Trulicity 1.5 >> 3 mg weekly  >> tried Ozempic >> constipation >> off for 3 mo - Tresiba 12 - started 02/2020 >> 20 >> 12 >> 12-14 >> 12.5 >> he decreased by himself to 8 units daily >> increased to 10 >> 12 units daily He had to stop [place] as K increased (took 1 mo, off for 1.5 mo). Invokana was not covered.  Pt checks his sugars 1-2 times a day - am: 90, 96-140, 158, 205, 212 >> 108-173, 198, 219 >> 101, 136-173 - 2h after b'fast: 220 >> n/c >> 233 >> n/c >> 292 >> n/c - before lunch:101-118 >> n/c >> 151 >> n/c >> 107, 140 >> 126 - 2h after lunch:  154 >> n/c >> 114-137 >> n/c >> 119 - before dinner: n/c >> 85 >> n/c >> 123-138 >> n/c  >> 165 - 2h after dinner:n/c  >> 160 >> 167, 172 >> 160 >> n/c >> 179 - bedtime:  109-130 >> n/c >> 137 >> n/c >> 161 >> 180 - nighttime: n/c >> 165 >> n/c >> 117 >> n/c Lowest sugar was 65 >> .Marland Kitchen.107 >> 101; he has hypoglycemia awareness in the 70s. Highest sugar was 292 >>... 219 >> 180.  Glucometer: Precision >> One Touch Ultra mini >> Livongo >> BioTel  Pt's meals are: - Breakfast:  Oatmeal, wheat English muffin  - Lunch: salad, 1/2 sandwich - Dinner: meat + veggies, added  carbs back - Snacks: 2x a day, fruit bar, fresh fruit - snacking after dinner  + Mild CKD: Lab Results  Component Value Date   BUN 33 (H) 12/24/2022   CREATININE 1.29 (H) 12/24/2022  12/15/2022: ACR 70.96 01/04/2022: 19/1.26, GFR 58, glucose 138, ACR 13.59 04/16/2017: 15/1.2 07/13/2016: BUN/Cr 13/1.0, Glu 116, UACR 0.5 11/10/2015: EGFR 80, creatinine 1.1 04/15/2015: GFR 88.1, Cr 1.06 On lisinopril 10 mg daily.  + HL; last set [place]: [place] 182/287/37/98 [place]/[place] [facility] 173/390/32/86 Lab Results  Component Value Date   CHOL 166 09/21/2021   HDL 36.70 (L) 09/21/2021   LDLCALC 104 07/13/2016   LDLDIRECT 80.0 09/21/2021   TRIG 333.0 (H) 09/21/2021   CHOLHDL 5 09/21/2021  04/16/2016: 179/288/44/77 04/15/2015: LDL 89 On pravastatin 10. On fish oil 1000 mg daily >> increased to 2x a day  12/2020.  - last dilated eye exam was in 12/2022 [facility]): No DR reportedly, + glaucoma. 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  - + numbness and tingling in his feet.  He is on alpha-lipoic acid by neurology.  He also sees podiatry.  Latest foot exam was done by Dr. Al Corpus on 10/25/2022.  He was in a DM clinical trial before: "Jump Start"  lifestyle Research Trial. He was on the [employer] and lifestyle program in the past.  He also has a history of HTN, ED. He has gout - prev. on Alopurinol >> then  on Uloric(he had a rash with allopurinol) He had a R foot ulcer 04/2017 and again in 11/2017. He is seen by a vascular dr.  Dolores Patty were normal then. In 11/2017 he was getting Krysexxa injections for gout.  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 He had recurrent falls before last visit and was found to have orthostatic hypotension. His BP med doses were changed.  ROS: + see HPI  I reviewed pt's medications, allergies, PMH, social hx, family hx, and changes were documented in the history of present illness. Otherwise, unchanged from my initial visit note.  Past Medical History:  Diagnosis Date   Allergic rhinitis    Diabetes mellitus type II    Diverticulosis of colon    Glaucoma    peripheral vision loss left eye.    History of agent Orange exposure    HLD (hyperlipidemia)    HTN (hypertension)    Hypertrophy of prostate without urinary obstruction and other lower urinary tract symptoms (LUTS)    Osteoarthritis    Past Surgical History:  Procedure Laterality Date   TOOTH EXTRACTION      History   Social History   Marital Status: Married    Spouse Name: N/A   Number of Children: 2   Occupational History   Retired-[employer]    Social History Main Topics   Smoking status: Never Smoker    Smokeless tobacco: Never Used   Alcohol Use: No   Drug Use: No    [place] vet- 20% disability from shrapnel  wounds 1967 and 20% agent orange   Married (Widowed 1996; remarried 6/03)   Retired [employer]      Has living will   Wife, then daughter Billie Lade will be health care POA   Would want attempts at resuscitation   Would not want feeding tube if cognitively unaware   Current Outpatient Medications on File Prior to Visit  Medication Sig Dispense Refill  albuterol (VENTOLIN HFA) 108 (90 Base) MCG/ACT inhaler Inhale 2 puffs into the lungs every 4 (four) hours as needed.     Alpha-Lipoic Acid 600 MG CAPS Take 1 capsule (600 mg total) by mouth in the morning and at bedtime. 60 capsule 3   aspirin 81 MG tablet Take 81 mg by mouth daily.     brimonidine (ALPHAGAN) 0.2 % ophthalmic solution Place 1 drop into both eyes 2 (two) times daily.      cetirizine (ZYRTEC) 10 MG tablet Take 10 mg by mouth daily.     colchicine 0.6 MG tablet TAKE 2 TABS AT 1ST SIGN OF GOUT FLARE FOLLOWED BY 1 TAB AFTER 1 HR NEXT DAY TAKE 1 TAB DAILY FOR 5 DAYS AS NEEDED AS DIRECTED 180 tablet 0   fluticasone (FLONASE) 50 MCG/ACT nasal spray      gabapentin (NEURONTIN) 300 MG capsule TAKE 2 CAPSULES BY MOUTH 3 TIMES DAILY. 540 capsule 2   glipiZIDE (GLUCOTROL XL) 5 MG 24 hr tablet TAKE 1 TABLET BY MOUTH IN THE MORNING AND 1 TAB AT DINNERTIME 180 tablet 1   glucose blood (ONE TOUCH ULTRA TEST) test strip Use as instructed to check sugar 2 times daily 200 each 5   indomethacin (INDOCIN) 50 MG capsule Take 1 capsule (50 mg total) by mouth 2 (two) times daily with a meal. (Patient taking differently: Take 50 mg by mouth 2 (two) times daily as needed.) 60 capsule 1   insulin degludec (TRESIBA FLEXTOUCH) 200 UNIT/ML FlexTouch Pen Inject 12-14 Units into the skin daily. 9 mL 3   Insulin Pen Needle (BD PEN NEEDLE NANO 2ND GEN) 32G X 4 MM MISC USE ONCE DAILY 200 each 3   latanoprost (XALATAN) 0.005 % ophthalmic solution Place 1 drop into both eyes at bedtime.      lisinopril (PRINIVIL,ZESTRIL) 20 MG tablet Take 10 mg by mouth daily.      metFORMIN (GLUCOPHAGE) 1000 MG tablet Take 1,000 mg by mouth 2 (two) times daily with a meal.     Misc Natural Products (GLUCOSAMINE CHONDROITIN ADV PO) Take 2 tablets by mouth 2 (two) times daily.     Multiple Vitamin (MULTIVITAMIN) tablet Take 1 tablet by mouth daily.     mupirocin ointment (BACTROBAN) 2 % Apply to wound twice a day. 30 g 2   nirmatrelvir/ritonavir (PAXLOVID) 20 x 150 MG & 10 x 100MG  TABS Take 3 tablets by mouth 2 (two) times daily for 5 days. Patient GFR is 55. Take nirmatrelvir (150 mg) two tablets twice daily for 5 days and ritonavir (100 mg) one tablet twice daily for 5 days. 30 tablet 0   Omega-3 Fatty Acids (FISH OIL) 1200 MG CAPS      pravastatin (PRAVACHOL) 20 MG tablet Take 10 mg by mouth daily.      Semaglutide, 1 MG/DOSE, 4 MG/3ML SOPN Inject 1 mg as directed once a week. (Patient not taking: Reported on 11/21/2022) 3 mL 1   sildenafil (REVATIO) 20 MG tablet TAKE 3 TO 5 TABLETS BY MOUTH ONCE DAILY AS NEEDED 50 tablet 11   tamsulosin (FLOMAX) 0.4 MG CAPS capsule TAKE 1 CAPSULE BY MOUTH EVERY DAY 90 capsule 0   triamcinolone ointment (KENALOG) 0.5 % Apply 1 application topically 2 (two) times daily. 30 g 0   ULORIC 40 MG tablet Take 40 mg by mouth daily.  4   Vitamin D, Cholecalciferol, 1000 UNITS CAPS Take 1 capsule by mouth daily.     No current facility-administered medications on  file prior to visit.   No Known Allergies Family History  Problem Relation Age of Onset   Colon cancer Father    Stroke Mother    Heart attack Mother    Coronary artery disease Mother    Hypertension Other        several siblings   Diabetes Other        several siblings   Coronary artery disease Brother    ALS Sister    Dementia Sister    Neuropathy Neg Hx    PE: There were no vitals taken for this visit.  Wt Readings from Last 3 Encounters:  12/24/22 213 lb 13.5 oz (97 kg)  12/24/22 215 lb (97.5 kg)  11/21/22 216 lb (98 kg)   Constitutional:  in NAD  The physical exam  was not performed (virtual visit).  ASSESSMENT: 1. DM2, insulin-dependent, uncontrolled, with complications - R foot ulcer. Nl ABIs - mild CKD  2. HL  3.  Obesity class I  PLAN:  1. Patient with longstanding, uncontrolled, type 2 diabetes, on daily long-acting insulin and also metformin and sulfonylurea.  He was on Trulicity in the past with good results, however, he had to come off due to [employer].  We tried to switch to Ozempic but he developed side effects (fecal urgency) and had to stop. -At last visit, HbA1c was lower, at 7.2%, but afterwards, he had another HbA1c which was higher, at 8.2%, at the beginning of this month -At today's visit, sugars appear to be slightly above target.  We discussed about possibly restarting the GLP-1 receptor agonist.  He agrees to try Trulicity, which he tolerated well in the past.  I sent a lower dose to his pharmacy, 1.5 mg weekly and we can increase the dose as needed.  I advised her to let me know after approximately a month if we could increase the dose to 3 mg weekly.  Otherwise, we will continue the current regimen.  We did discuss that if he is not able to get Trulicity, we may need to increase the Tresiba dose by 2 to 3 units. - I suggested to:  Patient Instructions  Please continue: - Metformin 2000 mg with dinner - Glipizide XL 5 mg in a.m. and 5 mg before dinner - Tresiba 12 units daily  Please restart: - Trulicity 1.5 mg weekly  Let me know if we need to increase the dose in ~1 month.  Please return in 3-4 months with your sugar log.   - we checked his HbA1c: 7.2% (lower) - advised to check sugars at different times of the day - 1x a day, rotating check times - advised for yearly eye exams >> he is UTD - return to clinic in 4 months  2. HL -Reviewed latest lipid panel from 12/2022: Triglycerides elevated, LDL also above goal -He continues pravastatin 10 mg daily and omega-3 fatty acids 1000 mg twice a day, without side  effects  3. Obesity class I -He was on Trulicity 3 mg weekly, however, we had to switch to Ozempic due to lack of availability.  He was not able to tolerate this. -He gained 7 pounds before last visit  Carlus Pavlov, MD PhD [facility]

## 2022-12-27 NOTE — Patient Instructions (Addendum)
Please continue: - Metformin 2000 mg with dinner - Glipizide XL 5 mg in a.m. and 5 mg before dinner - Tresiba 12 units daily  Please restart: - Trulicity 1.5 mg weekly  Let me know if we need to increase the dose in ~1 month.  Please return in 3-4 months with your sugar log.

## 2022-12-29 ENCOUNTER — Other Ambulatory Visit: Payer: Self-pay | Admitting: Internal Medicine

## 2023-01-04 DIAGNOSIS — Z23 Encounter for immunization: Secondary | ICD-10-CM | POA: Diagnosis not present

## 2023-01-10 ENCOUNTER — Encounter: Payer: Self-pay | Admitting: Internal Medicine

## 2023-01-10 ENCOUNTER — Ambulatory Visit: Payer: Medicare Other | Admitting: Internal Medicine

## 2023-01-10 VITALS — BP 102/60 | HR 77 | Temp 98.2°F | Ht 71.5 in | Wt 214.0 lb

## 2023-01-10 DIAGNOSIS — Z7984 Long term (current) use of oral hypoglycemic drugs: Secondary | ICD-10-CM | POA: Diagnosis not present

## 2023-01-10 DIAGNOSIS — N1831 Chronic kidney disease, stage 3a: Secondary | ICD-10-CM | POA: Diagnosis not present

## 2023-01-10 DIAGNOSIS — E1142 Type 2 diabetes mellitus with diabetic polyneuropathy: Secondary | ICD-10-CM

## 2023-01-10 DIAGNOSIS — Z23 Encounter for immunization: Secondary | ICD-10-CM | POA: Diagnosis not present

## 2023-01-10 DIAGNOSIS — Z0000 Encounter for general adult medical examination without abnormal findings: Secondary | ICD-10-CM | POA: Diagnosis not present

## 2023-01-10 DIAGNOSIS — N138 Other obstructive and reflux uropathy: Secondary | ICD-10-CM

## 2023-01-10 DIAGNOSIS — N401 Enlarged prostate with lower urinary tract symptoms: Secondary | ICD-10-CM | POA: Diagnosis not present

## 2023-01-10 DIAGNOSIS — G4733 Obstructive sleep apnea (adult) (pediatric): Secondary | ICD-10-CM | POA: Diagnosis not present

## 2023-01-10 LAB — HM DIABETES FOOT EXAM

## 2023-01-10 MED ORDER — TAMSULOSIN HCL 0.4 MG PO CAPS: 0.4000 mg | ORAL_CAPSULE | Freq: Every day | ORAL | 3 refills | Status: DC

## 2023-01-10 NOTE — Assessment & Plan Note (Signed)
Voids better with daily tamsulosin

## 2023-01-10 NOTE — Progress Notes (Signed)
Subjective:    Patient ID: Lucas [name], male    DOB: 10-Aug-1940, 82 y.o.   MRN: [id number]  HPI Here for Medicare wellness visit and follow up of chronic health conditions Reviewed advanced directives Reviewed other doctors---Dr Gherghe--endocrine, Dr Hyatt--podiatry, Dr Syed--rheumatology, Dr Ginette Pitman, Dr Lynnell Dike, Dr Rhea Pink, Dr Levie Heritage, Dr Ranae Palms No hospitalizations or surgery in the past year---just had IV fluids during COVID Did fall once--about a year ago. No injury Vision is fair--some loss of peripheral vision in left eye Hearing aides from the [place] Does go to the gym twice a week Some mood issues---nothing striking. Not anhedonic No alcohol or tobacco Wife does most of the shopping, etc. He handles trash and other chores No sig memory issues  Sleeps with CPAP--but misplaced his machine in move Feels it did help him--though he is not overly tired lately  Diabetes control fair A1c 7.2% in May---7.3% at [place] last month Trulicity, tresiba 8 daily, metformin, glipizide Continues on alpha lipoic acid and gabapentin for neuropathy. Mostly numb. Uses walking stick for stability  Recent GFR mildly low at 56 Voids okay with tamsulosin--clearly has helped him empty better  No chest pain or SOB No dizziness or syncope No palpitations Episodic edema--not lately  No recent gout flares Only if he isn't careful eating---last was a year ago after oysters  Current Outpatient Medications on File Prior to Visit  Medication Sig Dispense Refill   albuterol (VENTOLIN HFA) 108 (90 Base) MCG/ACT inhaler Inhale 2 puffs into the lungs every 4 (four) hours as needed.     Alpha-Lipoic Acid 600 MG CAPS Take 1 capsule (600 mg total) by mouth in the morning and at bedtime. 60 capsule 3   aspirin 81 MG tablet Take 81 mg by mouth daily.     brimonidine (ALPHAGAN) 0.2 % ophthalmic solution Place 1 drop into both eyes 2 (two) times daily.      cetirizine  (ZYRTEC) 10 MG tablet Take 10 mg by mouth daily.     colchicine 0.6 MG tablet TAKE 2 TABS AT 1ST SIGN OF GOUT FLARE FOLLOWED BY 1 TAB AFTER 1 HR NEXT DAY TAKE 1 TAB DAILY FOR 5 DAYS AS NEEDED AS DIRECTED 180 tablet 0   Dulaglutide (TRULICITY) 1.5 MG/0.5ML SOPN Inject 1.5 mg into the skin once a week. 2 mL 5   fluticasone (FLONASE) 50 MCG/ACT nasal spray      gabapentin (NEURONTIN) 300 MG capsule TAKE 2 CAPSULES BY MOUTH 3 TIMES DAILY. 540 capsule 2   glipiZIDE (GLUCOTROL XL) 5 MG 24 hr tablet TAKE 1 TABLET BY MOUTH IN THE MORNING AND 1 TAB AT DINNERTIME 180 tablet 1   glucose blood (ONE TOUCH ULTRA TEST) test strip Use as instructed to check sugar 2 times daily 200 each 5   indomethacin (INDOCIN) 50 MG capsule Take 1 capsule (50 mg total) by mouth 2 (two) times daily with a meal. (Patient taking differently: Take 50 mg by mouth 2 (two) times daily as needed.) 60 capsule 1   insulin degludec (TRESIBA FLEXTOUCH) 200 UNIT/ML FlexTouch Pen Inject 12-14 Units into the skin daily. 9 mL 3   Insulin Pen Needle (BD PEN NEEDLE NANO 2ND GEN) 32G X 4 MM MISC USE ONCE DAILY 200 each 3   latanoprost (XALATAN) 0.005 % ophthalmic solution Place 1 drop into both eyes at bedtime.      lisinopril (PRINIVIL,ZESTRIL) 20 MG tablet Take 10 mg by mouth daily.     metFORMIN (GLUCOPHAGE) 1000 MG tablet Take  1,000 mg by mouth 2 (two) times daily with a meal.     Misc Natural Products (GLUCOSAMINE CHONDROITIN ADV PO) Take 2 tablets by mouth 2 (two) times daily.     Multiple Vitamin (MULTIVITAMIN) tablet Take 1 tablet by mouth daily.     mupirocin ointment (BACTROBAN) 2 % Apply to wound twice a day. 30 g 2   Omega-3 Fatty Acids (FISH OIL) 1200 MG CAPS      pravastatin (PRAVACHOL) 20 MG tablet Take 10 mg by mouth daily.      sildenafil (REVATIO) 20 MG tablet TAKE 3 TO 5 TABLETS BY MOUTH ONCE DAILY AS NEEDED 50 tablet 11   tamsulosin (FLOMAX) 0.4 MG CAPS capsule TAKE 1 CAPSULE BY MOUTH EVERY DAY 90 capsule 0   triamcinolone  ointment (KENALOG) 0.5 % Apply 1 application topically 2 (two) times daily. 30 g 0   ULORIC 40 MG tablet Take 40 mg by mouth daily.  4   Vitamin D, Cholecalciferol, 1000 UNITS CAPS Take 1 capsule by mouth daily.     No current facility-administered medications on file prior to visit.    No Known Allergies  Past Medical History:  Diagnosis Date   Allergic rhinitis    Diabetes mellitus type II    Diverticulosis of colon    Glaucoma    peripheral vision loss left eye.    History of agent Orange exposure    HLD (hyperlipidemia)    HTN (hypertension)    Hypertrophy of prostate without urinary obstruction and other lower urinary tract symptoms (LUTS)    Osteoarthritis     Past Surgical History:  Procedure Laterality Date   TOOTH EXTRACTION      Family History  Problem Relation Age of Onset   Colon cancer Father    Stroke Mother    Heart attack Mother    Coronary artery disease Mother    Hypertension Other        several siblings   Diabetes Other        several siblings   Coronary artery disease Brother    ALS Sister    Dementia Sister    Neuropathy Neg Hx     Social History   Socioeconomic History   Marital status: Married    Spouse name: Not on file   Number of children: Not on file   Years of education: Not on file   Highest education level: Bachelor's degree (e.g., BA, AB, BS)  Occupational History   Occupation: Retired-[employer]  Tobacco Use   Smoking status: Never   Smokeless tobacco: Never  Vaping Use   Vaping status: Never Used  Substance and Sexual Activity   Alcohol use: No    Alcohol/week: 0.0 standard drinks of alcohol   Drug use: No   Sexual activity: Not on file  Other Topics Concern   Not on file  Social History Narrative   [place] vet- 20% disability from shrapnel wounds 1967 and 20% agent orange   Married (Widowed 1996; remarried 6/03)   Retired [employer]      Has living will   Wife, then daughter Billie Lade will be health care POA   Would want  attempts at resuscitation   Would not want feeding tube if cognitively unaware   Social Determinants of Health   Financial Resource Strain: Low Risk  (01/08/2023)   Overall Financial Resource Strain (CARDIA)    Difficulty of Paying Living Expenses: Not hard at all  Food Insecurity: No Food Insecurity (01/08/2023)   Hunger  Vital Sign    Worried About Programme researcher, broadcasting/film/video in the Last Year: Never true    Ran Out of Food in the Last Year: Never true  Transportation Needs: No Transportation Needs (01/08/2023)   PRAPARE - Administrator, Civil Service (Medical): No    Lack of Transportation (Non-Medical): No  Physical Activity: Insufficiently Active (01/08/2023)   Exercise Vital Sign    Days of Exercise per Week: 2 days    Minutes of Exercise per Session: 30 min  Stress: No Stress Concern Present (01/08/2023)   [employer] of Occupational Health - Occupational Stress Questionnaire    Feeling of Stress : Not at all  Social Connections: Socially Integrated (01/08/2023)   Social Connection and Isolation Panel [NHANES]    Frequency of Communication with Friends and Family: Twice a week    Frequency of Social Gatherings with Friends and Family: Once a week    Attends Religious Services: 1 to 4 times per year    Active Member of [employer] or Organizations: Yes    Attends Banker Meetings: 1 to 4 times per year    Marital Status: Married  Catering manager Violence: Unknown (08/29/2022)   Received from [employer], [facility]   HITS    Physically Hurt: Not on file    Insult or Talk Down To: Not on file    Threaten Physical Harm: Not on file    Scream or Curse: Not on file   Review of Systems Not a big appetite--taste is off. Weight is stable Wears seat belt Teeth okay--overdue for dentist (broken caps) Feels stiffness in back--no sig pain. No major joint issues No suspicious skin lesions--does see derm at [facility] Bowels move okay---no blood    Objective:   Physical  Exam Constitutional:      Appearance: Normal appearance.  HENT:     Mouth/Throat:     Pharynx: No oropharyngeal exudate or posterior oropharyngeal erythema.  Eyes:     Conjunctiva/sclera: Conjunctivae normal.     Pupils: Pupils are equal, round, and reactive to light.  Cardiovascular:     Rate and Rhythm: Normal rate and regular rhythm.     Heart sounds: No murmur heard.    No gallop.     Comments: Faint pedal pulses Pulmonary:     Effort: Pulmonary effort is normal.     Breath sounds: Normal breath sounds. No wheezing or rales.  Abdominal:     Palpations: Abdomen is soft.     Tenderness: There is no abdominal tenderness.  Musculoskeletal:     Cervical back: Neck supple.     Right lower leg: No edema.     Left lower leg: No edema.  Lymphadenopathy:     Cervical: No cervical adenopathy.  Skin:    Findings: No lesion or rash.     Comments: No foot lesions  Neurological:     General: No focal deficit present.     Mental Status: He is alert and oriented to person, place, and time.     Comments: Word naming---13/1 minute Recall 1/3 Decreased sensation in feet   Psychiatric:        Mood and Affect: Mood normal.        Behavior: Behavior normal.            Assessment & Plan:

## 2023-01-10 NOTE — Assessment & Plan Note (Signed)
I have personally reviewed the Medicare Annual Wellness questionnaire and have noted 1. The patient's medical and social history 2. Their use of alcohol, tobacco or illicit drugs 3. Their current medications and supplements 4. The patient's functional ability including ADL's, fall risks, home safety risks and hearing or visual             impairment. 5. Diet and physical activities 6. Evidence for depression or mood disorders  The patients weight, height, BMI and visual acuity have been recorded in the chart I have made referrals, counseling and provided education to the patient based review of the above and I have provided the pt with a written personalized care plan for preventive services.  I have provided you with a copy of your personalized plan for preventive services. Please take the time to review along with your updated medication list.  Done with cancer screening Does go to the gym Had COVID vaccine already--will give flu vaccine

## 2023-01-10 NOTE — Assessment & Plan Note (Signed)
Needs new machine Last test 2016 Will ask Dr Maple Hudson to reevaluate

## 2023-01-10 NOTE — Assessment & Plan Note (Signed)
Lab Results  Component Value Date   HGBA1C 7.2 (A) 08/25/2022   Has had good control Trulicity 1.5mg  weekly, metformin 1000 bid, tresiba 8 units daily, glipizide 5 bid Uses gabapentin 300-600 tid and alpha lipoic acid for neuropathy

## 2023-01-10 NOTE — Progress Notes (Signed)
Hearing Screening - Comments:: Has hearing aids. Wearing them today Vision Screening - Comments:: September 2024

## 2023-01-10 NOTE — Assessment & Plan Note (Signed)
Mildly low Is on lisinopril 20mg  daily

## 2023-01-17 ENCOUNTER — Other Ambulatory Visit: Payer: Self-pay | Admitting: Internal Medicine

## 2023-01-24 ENCOUNTER — Encounter: Payer: Self-pay | Admitting: Internal Medicine

## 2023-01-24 ENCOUNTER — Ambulatory Visit: Payer: Medicare Other | Admitting: Podiatry

## 2023-01-24 ENCOUNTER — Other Ambulatory Visit: Payer: Self-pay | Admitting: Internal Medicine

## 2023-01-24 MED ORDER — COLCHICINE 0.6 MG PO TABS: 0.6000 mg | ORAL_TABLET | Freq: Two times a day (BID) | ORAL | 0 refills | Status: DC

## 2023-01-24 NOTE — Telephone Encounter (Signed)
His eye doctor will need to do the latanoprost

## 2023-01-26 ENCOUNTER — Other Ambulatory Visit: Payer: Self-pay | Admitting: Internal Medicine

## 2023-01-26 MED ORDER — GABAPENTIN 300 MG PO CAPS: 600.0000 mg | ORAL_CAPSULE | Freq: Two times a day (BID) | ORAL | 3 refills | Status: DC

## 2023-01-26 NOTE — Addendum Note (Signed)
Addended by: Eual Fines on: 01/26/2023 04:06 PM   Modules accepted: Orders

## 2023-01-26 NOTE — Addendum Note (Signed)
Addended by: Eual Fines on: 01/26/2023 01:14 PM   Modules accepted: Orders

## 2023-01-30 ENCOUNTER — Other Ambulatory Visit: Payer: Self-pay

## 2023-01-30 ENCOUNTER — Other Ambulatory Visit: Payer: Self-pay | Admitting: Internal Medicine

## 2023-01-30 DIAGNOSIS — E1165 Type 2 diabetes mellitus with hyperglycemia: Secondary | ICD-10-CM

## 2023-01-30 MED ORDER — TRESIBA FLEXTOUCH 200 UNIT/ML ~~LOC~~ SOPN: 12.0000 [IU] | PEN_INJECTOR | Freq: Every day | SUBCUTANEOUS | 1 refills | Status: DC

## 2023-02-01 ENCOUNTER — Other Ambulatory Visit: Payer: Self-pay | Admitting: Internal Medicine

## 2023-02-02 MED ORDER — GABAPENTIN 300 MG PO CAPS: 600.0000 mg | ORAL_CAPSULE | Freq: Two times a day (BID) | ORAL | 3 refills | Status: DC

## 2023-02-02 NOTE — Addendum Note (Signed)
Addended by: Eual Fines on: 02/02/2023 02:24 PM   Modules accepted: Orders

## 2023-02-12 ENCOUNTER — Ambulatory Visit (INDEPENDENT_AMBULATORY_CARE_PROVIDER_SITE_OTHER): Payer: Medicare Other | Admitting: Podiatry

## 2023-02-12 ENCOUNTER — Encounter: Payer: Self-pay | Admitting: Podiatry

## 2023-02-12 DIAGNOSIS — E1142 Type 2 diabetes mellitus with diabetic polyneuropathy: Secondary | ICD-10-CM | POA: Diagnosis not present

## 2023-02-12 DIAGNOSIS — B351 Tinea unguium: Secondary | ICD-10-CM | POA: Diagnosis not present

## 2023-02-12 DIAGNOSIS — M79676 Pain in unspecified toe(s): Secondary | ICD-10-CM | POA: Diagnosis not present

## 2023-02-12 NOTE — Progress Notes (Signed)
Presents today chief complaint of painful elongated toenails 1 through 5 bilateral.  Objective: Pulses are palpable.  Kidney cutaneous composition appears to be normal with no rashes.  Toenails are thick yellow dystrophic clinically mycotic.  Assessment: Pain limb secondary to onychomycosis.  Plan: Debridement of toenails 1 through 5 bilateral

## 2023-02-28 ENCOUNTER — Institutional Professional Consult (permissible substitution): Payer: Medicare Other | Admitting: Primary Care

## 2023-03-19 ENCOUNTER — Encounter: Payer: Self-pay | Admitting: Adult Health

## 2023-03-19 ENCOUNTER — Ambulatory Visit: Payer: Medicare Other | Admitting: Adult Health

## 2023-03-19 VITALS — BP 122/62 | HR 69 | Temp 98.5°F | Ht 71.0 in | Wt 213.2 lb

## 2023-03-19 DIAGNOSIS — R0683 Snoring: Secondary | ICD-10-CM | POA: Insufficient documentation

## 2023-03-19 NOTE — Assessment & Plan Note (Deleted)
Loud snoring, daytime sleepiness, history of sleep apnea all concerning for ongoing sleep apnea.  Will set up for home sleep study.  Has not been compliant with CPAP for several years.  Patient education given on sleep apnea  - discussed how weight can impact sleep and risk for sleep disordered breathing - discussed options to assist with weight loss: combination of diet modification, cardiovascular and strength training exercises   - had an extensive discussion regarding the adverse health consequences related to untreated sleep disordered breathing - specifically discussed the risks for hypertension, coronary artery disease, cardiac dysrhythmias, cerebrovascular disease, and diabetes - lifestyle modification discussed   - discussed how sleep disruption can increase risk of accidents, particularly when driving - safe driving practices were discussed   Plan  Patient Instructions  Set up for home sleep study  Work on healthy sleep regimen  Do not drive if sleepy  Follow up in 6 weeks to discuss sleep study results and treatment plan

## 2023-03-19 NOTE — Progress Notes (Signed)
@Patient  ID: Lucas [name], male    DOB: [birth year], 82 y.o.   MRN: [id number]  Chief Complaint  Patient presents with   Consult    Referring provider: Karie Schwalbe, MD  HPI: Discussed the use of AI scribe software for clinical note transcription with the patient, who gave verbal consent to proceed.  History of Present Illness   82 year old male seen for sleep consult March 19, 2023 to establish for sleep apnea Former patient of Dr. Roxy Cedar last seen 2016   03/19/2023 Sleep consult :  Patient presents for sleep consult today. The patient, previously diagnosed with sleep apnea approximately seven years ago, has been off his CPAP machine for the past few years. He initially used the machine for about four years after diagnosis. He reports frequent snoring and daytime sleepiness, particularly when sitting quietly. He typically goes to bed around 11 PM and wakes up around 10 AM, although he reports waking up two to three times during the night to urinate. He was last seen in the office in 2016.  Previous sleep study August 2016 showed AHI of 13.6/hour and SpO2 low at 85%.  Patient has no history of congestive heart failure or stroke.  Patient does not nap.  Does not use any sleep aids.  Has no symptoms suspicious for cataplexy or sleep paralysis.  Epworth score is 7 out of 24.  Typically gets sleepy if he sits down to watch TV, rest, passenger of the car and in the afternoon hours.  The patient has a history of diabetes, high blood pressure, high cholesterol, neuropathy, and kidney disease. He also has a history of gout and is on insulin for diabetes management.  The patient has a history of exposure to Agent Orange during his service in [place] and has shrapnel still present in his body. He has been diagnosed with glaucoma and has lost some peripheral vision in his left eye, which his eye doctor attributes to a minor stroke. He also reports dealing with PTSD .  The patient's weight  has been relatively stable, with a recent loss of about five pounds. He denies any use of sleep aids, . He is retired and receives most of his care outside of the [place], although he does have a primary care doctor .     Social history patient is widowed.  He is a never smoker.  No alcohol or drugs.  Moved to [place] in 2003.  He is a former Chiropractor.  Also was a veteran in the [place] War.  Family history positive for asthma  Surgical history none  TEST/EVENTS :  NPSG 11/16/14- mild OSA AHI 13.6/ hr, desat to 85%, weight 225 lbs  No Known Allergies  Immunization History  Administered Date(s) Administered   Fluad Quad(high Dose 65+) 01/09/2019, 03/18/2021, 03/12/2022   Fluad Trivalent(High Dose 65+) 01/10/2023   H1N1 03/25/2008   Hep A, Unspecified 07/27/2003   Influenza Split 12/10/2010, 03/06/2012   Influenza, High Dose Seasonal PF 01/21/2015   Influenza, Seasonal, Injecte, Preservative Fre 03/02/2009, 04/26/2010, 01/31/2011   Influenza,inj,Quad PF,6+ Mos 12/31/2012, 01/13/2014, 01/18/2015, 01/20/2016, 01/17/2017   Influenza-Unspecified 02/10/2003, 01/26/2004, 03/06/2005, 02/26/2006, 03/05/2007, 02/25/2008, 03/10/2012, 02/08/2013, 01/13/2014, 04/10/2016, 01/17/2017, 12/17/2017, 03/11/2019, 01/11/2020, 12/09/2020   PFIZER Comirnaty(Gray Top)Covid-19 Tri-Sucrose Vaccine 09/26/2020   PFIZER(Purple Top)SARS-COV-2 Vaccination 05/05/2019, 05/27/2019, 02/08/2020, 09/26/2020   Pfizer Covid-19 Vaccine Bivalent Booster 33yrs & up 03/11/2021   Pfizer(Comirnaty)Fall Seasonal Vaccine 12 years and older 02/26/2022   Pneumococcal Conjugate-13 04/15/2011, 04/14/2013   Pneumococcal Polysaccharide-23  02/10/2003, 04/10/2005, 07/13/2016   Respiratory Syncytial Virus Vaccine,Recomb Aduvanted(Arexvy) 04/27/2022   Td 04/12/1999, 12/17/2020   Tdap 05/24/2011   Yellow Fever 07/27/2003   Zoster Recombinant(Shingrix) 04/16/2017, 06/15/2017, 07/12/2017   Zoster, Live 03/02/2008, 03/25/2008    Past  Medical History:  Diagnosis Date   Allergic rhinitis    Diabetes mellitus type II    Diverticulosis of colon    Glaucoma    peripheral vision loss left eye.    History of agent Orange exposure    HLD (hyperlipidemia)    HTN (hypertension)    Hypertrophy of prostate without urinary obstruction and other lower urinary tract symptoms (LUTS)    Osteoarthritis     Tobacco History: Social History   Tobacco Use  Smoking Status Never  Smokeless Tobacco Never   Counseling given: Not Answered   Outpatient Medications Prior to Visit  Medication Sig Dispense Refill   Alpha-Lipoic Acid 600 MG CAPS Take 1 capsule (600 mg total) by mouth in the morning and at bedtime. 60 capsule 3   aspirin 81 MG tablet Take 81 mg by mouth daily.     brimonidine (ALPHAGAN) 0.2 % ophthalmic solution Place 1 drop into both eyes 2 (two) times daily.      cetirizine (ZYRTEC) 10 MG tablet Take 10 mg by mouth daily.     colchicine 0.6 MG tablet Take 1 tablet (0.6 mg total) by mouth 2 (two) times daily. With 2 at the onset of pain. Continue for 5 days after 180 tablet 0   Dulaglutide (TRULICITY) 1.5 MG/0.5ML SOPN Inject 1.5 mg into the skin once a week. 2 mL 5   fluticasone (FLONASE) 50 MCG/ACT nasal spray      gabapentin (NEURONTIN) 300 MG capsule Take 2 capsules (600 mg total) by mouth 2 (two) times daily. TAKE 2 CAPSULES BY MOUTH 3 TIMES DAILY. 360 capsule 3   glipiZIDE (GLUCOTROL XL) 5 MG 24 hr tablet TAKE 1 TABLET BY MOUTH IN THE MORNING AND 1 TAB AT DINNERTIME 180 tablet 1   glucose blood (ONE TOUCH ULTRA TEST) test strip Use as instructed to check sugar 2 times daily 200 each 5   indomethacin (INDOCIN) 50 MG capsule Take 1 capsule (50 mg total) by mouth 2 (two) times daily with a meal. (Patient taking differently: Take 50 mg by mouth 2 (two) times daily as needed.) 60 capsule 1   insulin degludec (TRESIBA FLEXTOUCH) 200 UNIT/ML FlexTouch Pen Inject 12-14 Units into the skin daily. 9 mL 1   Insulin Pen Needle  (BD PEN NEEDLE NANO 2ND GEN) 32G X 4 MM MISC USE ONCE DAILY 200 each 3   latanoprost (XALATAN) 0.005 % ophthalmic solution Place 1 drop into both eyes at bedtime.      lisinopril (PRINIVIL,ZESTRIL) 20 MG tablet Take 10 mg by mouth daily.     metFORMIN (GLUCOPHAGE) 1000 MG tablet Take 1,000 mg by mouth 2 (two) times daily with a meal.     Misc Natural Products (GLUCOSAMINE CHONDROITIN ADV PO) Take 2 tablets by mouth 2 (two) times daily.     Multiple Vitamin (MULTIVITAMIN) tablet Take 1 tablet by mouth daily.     mupirocin ointment (BACTROBAN) 2 % Apply to wound twice a day. 30 g 2   Omega-3 Fatty Acids (FISH OIL) 1200 MG CAPS      pravastatin (PRAVACHOL) 20 MG tablet Take 10 mg by mouth daily.      sildenafil (REVATIO) 20 MG tablet TAKE 3 TO 5 TABLETS BY MOUTH ONCE DAILY  AS NEEDED 50 tablet 11   tamsulosin (FLOMAX) 0.4 MG CAPS capsule Take 1 capsule (0.4 mg total) by mouth daily. 90 capsule 3   triamcinolone ointment (KENALOG) 0.5 % Apply 1 application topically 2 (two) times daily. 30 g 0   ULORIC 40 MG tablet Take 40 mg by mouth daily.  4   Vitamin D, Cholecalciferol, 1000 UNITS CAPS Take 1 capsule by mouth daily.     albuterol (VENTOLIN HFA) 108 (90 Base) MCG/ACT inhaler Inhale 2 puffs into the lungs every 4 (four) hours as needed. (Patient not taking: Reported on 03/19/2023)     No facility-administered medications prior to visit.     Review of Systems:   Constitutional:   No  weight loss, night sweats,  Fevers, chills, +fatigue, or  lassitude.  HEENT:   No headaches,  Difficulty swallowing,  Tooth/dental problems, or  Sore throat,                No sneezing, itching, ear ache, nasal congestion, post nasal drip,   CV:  No chest pain,  Orthopnea, PND, swelling in lower extremities, anasarca, dizziness, palpitations, syncope.   GI  No heartburn, indigestion, abdominal pain, nausea, vomiting, diarrhea, change in bowel habits, loss of appetite, bloody stools.   Resp: No shortness of  breath with exertion or at rest.  No excess mucus, no productive cough,  No non-productive cough,  No coughing up of blood.  No change in color of mucus.  No wheezing.  No chest wall deformity  Skin: no rash or lesions.  GU: no dysuria, change in color of urine, no urgency or frequency.  No flank pain, no hematuria   MS:  No joint pain or swelling.  No decreased range of motion.  No back pain.    Physical Exam  BP 122/62 (BP Location: Left Arm, Patient Position: Sitting, Cuff Size: Normal)   Pulse 69   Temp 98.5 F (36.9 C) (Oral)   Ht 5\' 11"  (1.803 m)   Wt 213 lb 3.2 oz (96.7 kg)   SpO2 99%   BMI 29.74 kg/m   GEN: A/Ox3; pleasant , NAD, well nourished    HEENT:  [place]/AT,  EACs-clear, TMs-wnl, NOSE-clear, THROAT-clear, no lesions, no postnasal drip or exudate noted.  Class 3 MP airway   NECK:  Supple w/ fair ROM; no JVD; normal carotid impulses w/o bruits; no thyromegaly or nodules palpated; no lymphadenopathy.    RESP  Clear  P & A; w/o, wheezes/ rales/ or rhonchi. no accessory muscle use, no dullness to percussion  CARD:  RRR, no m/r/g, no peripheral edema, pulses intact, no cyanosis or clubbing.  GI:   Soft & nt; nml bowel sounds; no organomegaly or masses detected.   Musco: Warm bil, no deformities or joint swelling noted.   Neuro: alert, no focal deficits noted.    Skin: Warm, no lesions or rashes    Lab Results:  CBC   BMET   BNP No results found for: "BNP"  ProBNP No results found for: "PROBNP"  Imaging: No results found.  Administration History     None           No data to display          No results found for: "NITRICOXIDE"      Assessment & Plan:   Assessment and Plan    Obstructive Sleep Apnea   Diagnosed seven years ago and last evaluated with a sleep study in 2016, he discontinued CPAP use  three years ago. He reports loud snoring, daytime sleepiness, and frequent nocturnal awakenings. We discussed the risks of untreated  sleep apnea, including cardiovascular complications, worsening diabetes, and increased stress, alongside the benefits of CPAP therapy such as improved sleep quality, reduced daytime sleepiness, and better control of comorbid conditions. He prefers a new sleep study to reassess severity and CPAP need. We will order a home sleep study and schedule a follow-up in six weeks to review the results.  Diabetes Mellitus   Continue on current regimen and follow-up with primary care   Hypertension  and Hyperlipidemia   Continue follow-up primary care   Chronic Kidney Disease   We will continue the current nephrology follow-up and management.  Peripheral Neuropathy   Managed with gabapentin 600 mg twice daily, continue follow-up with primary care  Follow-up   We scheduled a follow-up in six weeks to review the sleep study results.          Rubye Oaks, NP 03/19/2023

## 2023-03-19 NOTE — Patient Instructions (Signed)
Set up for home sleep study  Work on healthy sleep regimen  Do not drive if sleepy  Follow up in 6 weeks to discuss sleep study results and treatment plan .

## 2023-04-11 ENCOUNTER — Other Ambulatory Visit: Payer: Self-pay | Admitting: Internal Medicine

## 2023-04-13 ENCOUNTER — Telehealth: Payer: Self-pay

## 2023-04-13 NOTE — Telephone Encounter (Signed)
 Rx has been sent in the Refill encounter.

## 2023-04-13 NOTE — Telephone Encounter (Signed)
 Per Video Visit, pt needs to have a F/u visit in 3-4 months (last seen in September) Then I can send in his medication: Glipizide

## 2023-04-16 ENCOUNTER — Ambulatory Visit (INDEPENDENT_AMBULATORY_CARE_PROVIDER_SITE_OTHER): Payer: Medicare Other | Admitting: Internal Medicine

## 2023-04-16 ENCOUNTER — Encounter: Payer: Self-pay | Admitting: Internal Medicine

## 2023-04-16 VITALS — BP 122/70 | HR 79 | Ht 71.0 in | Wt 216.2 lb

## 2023-04-16 DIAGNOSIS — E785 Hyperlipidemia, unspecified: Secondary | ICD-10-CM

## 2023-04-16 DIAGNOSIS — E1142 Type 2 diabetes mellitus with diabetic polyneuropathy: Secondary | ICD-10-CM | POA: Diagnosis not present

## 2023-04-16 DIAGNOSIS — Z794 Long term (current) use of insulin: Secondary | ICD-10-CM | POA: Diagnosis not present

## 2023-04-16 DIAGNOSIS — Z7985 Long-term (current) use of injectable non-insulin antidiabetic drugs: Secondary | ICD-10-CM

## 2023-04-16 DIAGNOSIS — E66811 Obesity, class 1: Secondary | ICD-10-CM | POA: Diagnosis not present

## 2023-04-16 DIAGNOSIS — E1165 Type 2 diabetes mellitus with hyperglycemia: Secondary | ICD-10-CM

## 2023-04-16 DIAGNOSIS — Z7984 Long term (current) use of oral hypoglycemic drugs: Secondary | ICD-10-CM | POA: Diagnosis not present

## 2023-04-16 LAB — POCT GLYCOSYLATED HEMOGLOBIN (HGB A1C): Hemoglobin A1C: 7.2 % — AB (ref 4.0–5.6)

## 2023-04-16 NOTE — Progress Notes (Signed)
 Patient ID: Lucas [name], male   DOB: 1940-04-20, 83 y.o.   MRN: [id number]  HPI: Lucas [name] is a 83 y.o.-year-old maleyear-old male, presenting for f/u for DM2, dx in 1998, insulin -dependent (previous Agent Orange exposure in [place]), uncontrolled, with complications (mild CKD, foot ulcer). Last visit 4 months ago (virtual).  Interim hx: No blurry vision, nausea, chest pain.   At today's visit, he does not have complaints.  Reviewed HbA1c levels: 12/15/2022: HbA1c 8.2% Lab Results  Component Value Date   HGBA1C 7.2 (A) 08/25/2022   HGBA1C 7.4 (A) 04/26/2022   HGBA1C 7.3 (A) 12/21/2021   HGBA1C 6.9 (A) 08/17/2021   HGBA1C 7.1 (A) 04/19/2021   HGBA1C 6.9 (A) 12/14/2020   HGBA1C 6.7 (A) 07/05/2020   HGBA1C 7.8 (A) 03/01/2020   HGBA1C 7.8 (A) 10/27/2019   HGBA1C 8.2 (A) 07/21/2019   HGBA1C 7.6 (A) 03/17/2019   HGBA1C 8.1 (H) 09/24/2018   HGBA1C 7.9 (A) 03/18/2018   HGBA1C 7.6 (A) 12/07/2017   HGBA1C 7.8 08/06/2017   HGBA1C 7.4 05/10/2017   HGBA1C 8.3 11/29/2016   HGBA1C 7.7 05/08/2016   HGBA1C 7.7 12/17/2015   HGBA1C 8.8 (H) 07/28/2015   HGBA1C 7.2 02/05/2015   HGBA1C 6.7 11/05/2014   HGBA1C 9.1 (A) 06/29/2014   HGBA1C 8.7 (H) 01/13/2014   HGBA1C 7.9 (H) 07/14/2013   Pt is on a regimen of: - Metformin 2000 mg with dinner - Glipizide  XL 10 >> 5 mg before breakfast and 5 mg before dinner - Trulicity  1.5 >> 3 mg weekly >> tried Ozempic  >> constipation >> off for 3 mo >> restarted Trulicity  1.5 mg weekly - Tresiba  12 - started 02/2020 >> 20 >> 12 >> 12-14 >> 12.5 >> he decreased by himself to 8 units daily >> increased to 10 >> 12 >>  10 units daily He had to stop Farxiga  as K increased (took 1 mo, off for 1.5 mo). Invokana  was not covered.  Pt checks his sugars 1-2 times a day - am: 108-173, 198, 219 >> 101, 136-173 >> 88, 91-156, 177, 185 - 2h after b'fast: 220 >> n/c >> 233 >> n/c >> 292 >> n/c - before lunch: 151 >> n/c >> 107, 140 >> 126 >> 113-154 - 2h after lunch:  154 >>  n/c >> 114-137 >> n/c >> 119 >> 156 - before dinner: n/c >> 85 >> n/c >> 123-138 >> n/c  >> 165 >> n/c - 2h after dinner: 160 >> 167, 172 >> 160 >> n/c >> 179 >> 148, 218 - bedtime:  109-130 >> n/c >> 137 >> n/c >> 161 >> 180 >> n/c - nighttime: n/c >> 165 >> n/c >> 117 >> n/c Lowest sugar was 65 >> [name] >> 101 >> 88; he has hypoglycemia awareness in the 70s. Highest sugar was 292 >>... 219 >> 180 >> 218.  Glucometer: Precision >> One Touch Ultra mini >> Livongo >> BioTel  Pt's meals are: - Breakfast:  Oatmeal, wheat English muffin  - Lunch: salad, 1/2 sandwich - Dinner: meat + veggies, added carbs back - Snacks: 2x a day, fruit bar, fresh fruit - snacking after dinner  + Mild CKD: Repeated ACR reportedly normal. Lab Results  Component Value Date   BUN 33 (H) 12/24/2022   CREATININE 1.29 (H) 12/24/2022  12/15/2022: ACR 70.96 (contaminated specimen) 01/04/2022: 19/1.26, GFR 58, glucose 138, ACR 13.59 04/16/2017: 15/1.2 07/13/2016: BUN/Cr 13/1.0, Glu 116, UACR 0.5 On lisinopril 10 mg daily.  + HL; last set of lipids: [facility] 182/287/37/98 [facility]): 173/390/32/86 Lab Results  Component Value Date   CHOL 166 09/21/2021   HDL 36.70 (L) 09/21/2021   LDLCALC 104 07/13/2016   LDLDIRECT 80.0 09/21/2021   TRIG 333.0 (H) 09/21/2021   CHOLHDL 5 09/21/2021  04/16/2016: 179/288/44/77 04/15/2015: LDL 89 On pravastatin 20. On fish oil 1000 mg daily >> increased to 2x a day 12/2020.  - last dilated eye exam was in 12/2022 [facility]): No DR reportedly, + glaucoma. 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  - + numbness and tingling in his feet.  He is on alpha-lipoic acid by neurology.  He also sees podiatry.  Latest foot exam was done by Dr. Verta on 01/10/2023.  He was in a DM clinical trial before: Jump Start  lifestyle Research Trial. He was on the [employer] and lifestyle program in the past.  He also has a history  of HTN, ED. He has gout - prev. on Alopurinol >> then  on Uloric(he had a rash with allopurinol) He had a R foot ulcer 04/2017 and again in 11/2017. He is seen by a vascular dr.  Hosey were normal then. In 11/2017 he was getting Krysexxa injections for gout.  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 He had recurrent falls before last visit and was found to have orthostatic hypotension. His BP med doses were changed.  ROS: + see HPI  I reviewed pt's medications, allergies, PMH, social hx, family hx, and changes were documented in the history of present illness. Otherwise, unchanged from my initial visit note.  Past Medical History:  Diagnosis Date   Allergic rhinitis    Diabetes mellitus type II    Diverticulosis of colon    Glaucoma    peripheral vision loss left eye.    History of agent Orange exposure    HLD (hyperlipidemia)    HTN (hypertension)    Hypertrophy of prostate without urinary obstruction and other lower urinary tract symptoms (LUTS)    Osteoarthritis    Past Surgical History:  Procedure Laterality Date   TOOTH EXTRACTION      History   Social History   Marital Status: Married    Spouse Name: N/A   Number of Children: 2   Occupational History   Retired-[employer]    Social History Main Topics   Smoking status: Never Smoker    Smokeless tobacco: Never Used   Alcohol Use: No   Drug Use: No    [place] vet- 20% disability from shrapnel wounds 1967 and 20% agent orange   Married (Widowed 1996; remarried 6/03)   Retired [employer]      Has living will   Wife, then daughter Lucas [name] will be health care POA   Would want attempts at resuscitation   Would not want feeding tube if cognitively unaware   Current Outpatient Medications on File Prior to Visit  Medication Sig Dispense Refill   albuterol (VENTOLIN HFA) 108 (90 Base) MCG/ACT inhaler Inhale 2 puffs into the lungs every 4 (four) hours as needed. (Patient not taking: Reported on  03/19/2023)     Alpha-Lipoic Acid 600 MG CAPS Take 1 capsule (600 mg total) by mouth in the morning and at bedtime. 60 capsule 3   aspirin 81 MG tablet Take 81 mg by mouth daily.     brimonidine (ALPHAGAN) 0.2 % ophthalmic solution Place 1 drop into both eyes  2 (two) times daily.      cetirizine (ZYRTEC) 10 MG tablet Take 10 mg by mouth daily.     colchicine  0.6 MG tablet TAKE 1 TABLET (0.6 MG TOTAL) BY MOUTH 2 (TWO) TIMES DAILY. WITH 2 AT THE ONSET OF PAIN. CONTINUE FOR 5 DAYS AFTER 180 tablet 0   Dulaglutide  (TRULICITY ) 1.5 MG/0.5ML SOPN Inject 1.5 mg into the skin once a week. 2 mL 5   fluticasone (FLONASE) 50 MCG/ACT nasal spray      gabapentin  (NEURONTIN ) 300 MG capsule Take 2 capsules (600 mg total) by mouth 2 (two) times daily. TAKE 2 CAPSULES BY MOUTH 3 TIMES DAILY. 360 capsule 3   glipiZIDE  (GLUCOTROL  XL) 5 MG 24 hr tablet TAKE 1 TABLET BY MOUTH IN THE MORNING AND 1 TAB AT DINNERTIME 180 tablet 1   glucose blood (ONE TOUCH ULTRA TEST) test strip Use as instructed to check sugar 2 times daily 200 each 5   indomethacin  (INDOCIN ) 50 MG capsule Take 1 capsule (50 mg total) by mouth 2 (two) times daily with a meal. (Patient taking differently: Take 50 mg by mouth 2 (two) times daily as needed.) 60 capsule 1   insulin  degludec (TRESIBA  FLEXTOUCH) 200 UNIT/ML FlexTouch Pen Inject 12-14 Units into the skin daily. 9 mL 1   Insulin  Pen Needle (BD PEN NEEDLE NANO 2ND GEN) 32G X 4 MM MISC USE ONCE DAILY 200 each 3   latanoprost (XALATAN) 0.005 % ophthalmic solution Place 1 drop into both eyes at bedtime.      lisinopril (PRINIVIL,ZESTRIL) 20 MG tablet Take 10 mg by mouth daily.     metFORMIN (GLUCOPHAGE) 1000 MG tablet Take 1,000 mg by mouth 2 (two) times daily with a meal.     Misc Natural Products (GLUCOSAMINE CHONDROITIN ADV PO) Take 2 tablets by mouth 2 (two) times daily.     Multiple Vitamin (MULTIVITAMIN) tablet Take 1 tablet by mouth daily.     mupirocin  ointment (BACTROBAN ) 2 % Apply to wound  twice a day. 30 g 2   Omega-3 Fatty Acids (FISH OIL) 1200 MG CAPS      pravastatin (PRAVACHOL) 20 MG tablet Take 10 mg by mouth daily.      sildenafil  (REVATIO ) 20 MG tablet TAKE 3 TO 5 TABLETS BY MOUTH ONCE DAILY AS NEEDED 50 tablet 11   tamsulosin  (FLOMAX ) 0.4 MG CAPS capsule TAKE 1 CAPSULE BY MOUTH EVERY DAY 90 capsule 3   triamcinolone  ointment (KENALOG ) 0.5 % Apply 1 application topically 2 (two) times daily. 30 g 0   ULORIC 40 MG tablet Take 40 mg by mouth daily.  4   Vitamin D , Cholecalciferol, 1000 UNITS CAPS Take 1 capsule by mouth daily.     No current facility-administered medications on file prior to visit.   No Known Allergies Family History  Problem Relation Age of Onset   Colon cancer Father    Stroke Mother    Heart attack Mother    Coronary artery disease Mother    Hypertension Other        several siblings   Diabetes Other        several siblings   Coronary artery disease Brother    ALS Sister    Dementia Sister    Neuropathy Neg Hx    PE: BP 122/70   Pulse 79   Ht 5' 11 (1.803 m)   Wt 216 lb 3.2 oz (98.1 kg)   SpO2 97%   BMI 30.15 kg/m   Wt  Readings from Last 3 Encounters:  04/16/23 216 lb 3.2 oz (98.1 kg)  03/19/23 213 lb 3.2 oz (96.7 kg)  01/10/23 214 lb (97.1 kg)   Constitutional: overweight, in NAD Eyes:  EOMI, no exophthalmos ENT: no neck masses, no cervical lymphadenopathy Cardiovascular: RRR, No MRG Respiratory: CTA B Musculoskeletal: no deformities Skin:no rashes Neurological: no tremor with outstretched hands  ASSESSMENT: 1. DM2, insulin -dependent, uncontrolled, with complications - R foot ulcer. Nl ABIs - mild CKD  2. HL  3.  Obesity class I  PLAN:  1. Patient with longstanding, uncontrolled, type 2 diabetes, on daily long-acting insulin  along with metformin and sulfonylurea and weekly GLP-1 receptor agonist, added back at last visit.  He tried to switch to Ozempic  when Trulicity  was on national shortage, but he developed fecal  urgency and had to stop.  At last visit, sugars were slightly above target and HbA1c was higher, at 8.2%.  I advised him to start back on Trulicity  1.5 mg weekly.  We did discuss that if he was not able to obtain it, to increase the Tresiba  dose. -At today's visit, sugars are lower, similar to before, only with occasional hyperglycemic spikes in the morning and occasionally later in the day.  He is not checking very frequently later in the day, but he does have some values in the last month and a half.  We discussed about potentially increasing the dose of Tresiba  back to 12 units daily if his sugars are consistently above 130s in the morning but otherwise, I advised him to continue the current regimen. - I suggested to:  Patient Instructions  Please continue: - Metformin 2000 mg with dinner - Glipizide  XL 5 mg in a.m. and 5 mg before dinner - Tresiba  10-12 units daily - Trulicity  1.5 mg weekly   Please return in 3-4 months with your sugar log.   - we checked his HbA1c: 7.2% (lower) - advised to check sugars at different times of the day - 1x a day, rotating check times - advised for yearly eye exams >> he is UTD - he had an elevated ACR in 12/2022 but he tells me that this was due to a contaminated sample as per communication with [facility].  He had a repeat ACR reportedly, which was normal.  Previous ACR's were also normal. - return to clinic in 3-4 months  2. HL -Latest lipid panel was reviewed from 12/2022: Elevated triglycerides, LDL also above goal -He continues on pravastatin 20 mg daily and omega-3 fatty acids 1200 mg twice a day, without side effects  3. Obesity class I -He was on Trulicity  3 mg weekly previously, but we had to switch to Ozempic  due to lack of availability.  However, he was not able to tolerate this so he is back on Trulicity , at 1.5 mg weekly. -He gained 7 pounds before last visit, before restarting Trulicity  -He gained about 3 pounds in the last 4 months  Lela Fendt, MD PhD [facility] Endocrinology

## 2023-04-16 NOTE — Patient Instructions (Addendum)
 Please continue: - Metformin 2000 mg with dinner - Glipizide XL 5 mg in a.m. and 5 mg before dinner - Tresiba 10-12 units daily - Trulicity 1.5 mg weekly   Please return in 3-4 months with your sugar log.

## 2023-04-18 ENCOUNTER — Telehealth: Payer: Self-pay | Admitting: Adult Health

## 2023-04-18 NOTE — Telephone Encounter (Signed)
 Pt contacted [employer] they did all paperwork and ins info. Pt informed [employer] he will be traveling and will contact them back by the 1st. Pt was asked for his card number over the phone and did not appreciate that

## 2023-04-23 NOTE — Telephone Encounter (Signed)
 Can we set up with one of our in office -Alice machines instead . Please see if this will work  Routed to [facility] for guidance

## 2023-04-23 NOTE — Telephone Encounter (Signed)
 Pt will not be having HST because he is not comfortable with putting his card on file @ [employer] to secure device.

## 2023-05-07 ENCOUNTER — Ambulatory Visit: Payer: Medicare Other

## 2023-05-07 DIAGNOSIS — R0683 Snoring: Secondary | ICD-10-CM

## 2023-05-10 ENCOUNTER — Telehealth: Payer: Self-pay

## 2023-05-10 NOTE — Progress Notes (Signed)
Care Guide Pharmacy Note  05/10/2023 Name: Lucas [name] MRN: [id number] DOB: 10/14/1940  Referred By: Karie Schwalbe, MD Reason for referral: Care Coordination ([facility] Diabetes. )   Lucas [name] is a 83 y.o. year old male who is a primary care patient of Alphonsus Sias, Berneda Rose, MD.  [name] was referred to the pharmacist for assistance related to: DMII  Successful contact was made with the patient to discuss pharmacy services.  Patient declines engagement at this time. Contact information was provided to the patient should they wish to reach out for assistance at a later time.  [facility]  [facility], [facility] Management Assistant Direct Dial: [phone]  Fax: [phone]

## 2023-05-14 DIAGNOSIS — H2513 Age-related nuclear cataract, bilateral: Secondary | ICD-10-CM | POA: Diagnosis not present

## 2023-05-14 DIAGNOSIS — H401111 Primary open-angle glaucoma, right eye, mild stage: Secondary | ICD-10-CM | POA: Diagnosis not present

## 2023-05-14 DIAGNOSIS — H4720 Unspecified optic atrophy: Secondary | ICD-10-CM | POA: Diagnosis not present

## 2023-05-15 ENCOUNTER — Other Ambulatory Visit: Payer: Self-pay | Admitting: Internal Medicine

## 2023-05-16 ENCOUNTER — Encounter: Payer: Self-pay | Admitting: Podiatry

## 2023-05-16 ENCOUNTER — Ambulatory Visit (INDEPENDENT_AMBULATORY_CARE_PROVIDER_SITE_OTHER): Payer: Medicare Other | Admitting: Podiatry

## 2023-05-16 DIAGNOSIS — B351 Tinea unguium: Secondary | ICD-10-CM

## 2023-05-16 DIAGNOSIS — M79676 Pain in unspecified toe(s): Secondary | ICD-10-CM | POA: Diagnosis not present

## 2023-05-16 DIAGNOSIS — L2089 Other atopic dermatitis: Secondary | ICD-10-CM | POA: Insufficient documentation

## 2023-05-16 DIAGNOSIS — E1142 Type 2 diabetes mellitus with diabetic polyneuropathy: Secondary | ICD-10-CM | POA: Diagnosis not present

## 2023-05-16 DIAGNOSIS — Z461 Encounter for fitting and adjustment of hearing aid: Secondary | ICD-10-CM | POA: Insufficient documentation

## 2023-05-16 DIAGNOSIS — H6123 Impacted cerumen, bilateral: Secondary | ICD-10-CM | POA: Insufficient documentation

## 2023-05-16 DIAGNOSIS — Z4802 Encounter for removal of sutures: Secondary | ICD-10-CM | POA: Insufficient documentation

## 2023-05-16 NOTE — Progress Notes (Signed)
 He presents today chief complaint of painful elongated toenails relates that his A1c is 7.2.  He is also complaining of some benign callus.  Objective: Vital signs stable alert oriented x 3.  Pulses are palpable.  Toenails 1 through 5 bilateral are thick and slightly elongated and tender.  There is no callus deformity.  Neurologic sensorium is intact  Assessment: Pain limb secondary to onychomycosis and nail dystrophy.  Diabetes without significant podiatric complications  Plan: Debridement of toenails 1 through 5 bilateral

## 2023-05-17 ENCOUNTER — Encounter: Payer: Self-pay | Admitting: Adult Health

## 2023-05-17 ENCOUNTER — Telehealth (INDEPENDENT_AMBULATORY_CARE_PROVIDER_SITE_OTHER): Payer: Medicare Other | Admitting: Adult Health

## 2023-05-17 DIAGNOSIS — G4733 Obstructive sleep apnea (adult) (pediatric): Secondary | ICD-10-CM

## 2023-05-17 NOTE — Progress Notes (Signed)
 Virtual Visit via Video Note  I connected with Lucas [name] on 05/17/23 at  9:30 AM EST by a video enabled telemedicine application and verified that I am speaking with the correct person using two identifiers.  Location: Patient: Home  Provider: Office    I discussed the limitations of evaluation and management by telemedicine and the availability of in person appointments. The patient expressed understanding and agreed to proceed.  History of Present Illness: 83 yo male seen for sleep consult 03/19/23 to establish for OSA  Former patient of Dr. Neysa  last seen 2016 Medical history significant for insulin -dependent diabetes, hypertension, neuropathy, kidney disease, PTSD He is a [place] veteran with previous agent orange exposure and a retired Chiropractor.  Todays video visit is to discus sleep study results.  Patient was seen in December to reestablish for sleep apnea.  Former patient of Dr. Saundra last seen in office in 2016.  Patient was diagnosed with sleep apnea in 2016 started on CPAP and wore for a few years but then stopped.  He was complaining of frequent snoring and daytime sleepiness and daytime fatigue.  He was set up for a sleep study that was completed on May 07, 2023.  This showed moderate to severe sleep apnea with a AHI of 28.2/hour and SpO2 low at 76%.  We discussed his sleep study results in detail.  Went over treatment options.  Patient will proceed with CPAP therapy.  Patient education on CPAP care.      Observations/Objective: NPSG 11/16/14- mild OSA AHI 13.6/ hr, desat to 85%, weight 225 lbs   Assessment and Plan: Moderate to severe obstructive sleep apnea.  Patient has significant symptom burden with snoring and daytime sleepiness.  Has multiple comorbidities including diabetes hypertension and chronic kidney disease.  Treatment options were discussed in detail.  Will proceed with CPAP therapy begin auto CPAP 5 to 15 cm H2O.  Patient education on sleep apnea CPAP  care discussed in detail  - discussed how weight can impact sleep and risk for sleep disordered breathing - discussed options to assist with weight loss: combination of diet modification, cardiovascular and strength training exercises   - had an extensive discussion regarding the adverse health consequences related to untreated sleep disordered breathing - specifically discussed the risks for hypertension, coronary artery disease, cardiac dysrhythmias, cerebrovascular disease, and diabetes - lifestyle modification discussed   - discussed how sleep disruption can increase risk of accidents, particularly when driving - safe driving practices were discussed  Diabetes, chronic kidney disease, hypertension continue follow-up with primary care    Plan  Patient Instructions  Begin CPAP at bedtime, wear all night long for at least 6 or more hours Work on healthy sleep regimen  Do not drive if sleepy  Follow up in 3 months and As needed      Follow Up Instructions:    I discussed the assessment and treatment plan with the patient. The patient was provided an opportunity to ask questions and all were answered. The patient agreed with the plan and demonstrated an understanding of the instructions.   The patient was advised to call back or seek an in-person evaluation if the symptoms worsen or if the condition fails to improve as anticipated.  I provided 22 minutes of non-face-to-face time during this encounter.   Madelin Stank, NP

## 2023-05-17 NOTE — Patient Instructions (Signed)
 Begin CPAP at bedtime, wear all night long for at least 6 or more hours Work on healthy sleep regimen  Do not drive if sleepy  Follow up in 3 months and As needed

## 2023-06-04 ENCOUNTER — Other Ambulatory Visit: Payer: Self-pay | Admitting: Internal Medicine

## 2023-06-05 MED ORDER — GABAPENTIN 300 MG PO CAPS
600.0000 mg | ORAL_CAPSULE | Freq: Two times a day (BID) | ORAL | 3 refills | Status: AC
Start: 2023-06-05 — End: ?

## 2023-06-08 ENCOUNTER — Other Ambulatory Visit: Payer: Self-pay

## 2023-06-08 ENCOUNTER — Encounter: Payer: Self-pay | Admitting: Emergency Medicine

## 2023-06-08 ENCOUNTER — Ambulatory Visit
Admission: EM | Admit: 2023-06-08 | Discharge: 2023-06-08 | Disposition: A | Discharge status: Home / Self Care | Payer: Medicare Other | Attending: Emergency Medicine | Admitting: Emergency Medicine

## 2023-06-08 DIAGNOSIS — J069 Acute upper respiratory infection, unspecified: Secondary | ICD-10-CM | POA: Diagnosis not present

## 2023-06-08 DIAGNOSIS — S0990XA Unspecified injury of head, initial encounter: Secondary | ICD-10-CM | POA: Diagnosis not present

## 2023-06-08 MED ORDER — BENZONATATE 100 MG PO CAPS: 100.0000 mg | ORAL_CAPSULE | Freq: Three times a day (TID) | ORAL | 0 refills | Status: DC

## 2023-06-08 MED ORDER — AMOXICILLIN-POT CLAVULANATE 875-125 MG PO TABS: 1.0000 | ORAL_TABLET | Freq: Two times a day (BID) | ORAL | 0 refills | Status: DC

## 2023-06-08 MED ORDER — PROMETHAZINE-DM 6.25-15 MG/5ML PO SYRP: 2.5000 mL | ORAL_SOLUTION | Freq: Every evening | ORAL | 0 refills | Status: DC | PRN

## 2023-06-08 NOTE — ED Provider Notes (Signed)
 Lucas [name]    CSN: [id number] Arrival date & time: 06/08/23  1254      History   Chief Complaint Chief Complaint  Patient presents with   URI    HPI Lucas [name] is a 83 y.o. male.   Patient presents for evaluation of increased fatigue and malaise, nasal congestion,, wheezing only at nighttime present for 6 days.  Patient endorses that 3 days ago he slipped and fell backwards hitting the posterior aspect of his head, denies loss of consciousness.  Denies use of blood thinners.  Sick contact with exposure to pneumonia.  Poor appetite but able to tolerate some food and liquids.  Has attempted use of NyQuil.  Past Medical History:  Diagnosis Date   Allergic rhinitis    Diabetes mellitus type II    Diverticulosis of colon    Glaucoma    peripheral vision loss left eye.    History of agent Orange exposure    HLD (hyperlipidemia)    HTN (hypertension)    Hypertrophy of prostate without urinary obstruction and other lower urinary tract symptoms (LUTS)    Osteoarthritis     Patient Active Problem List   Diagnosis Date Noted   Encounter for fitting and adjustment of hearing aid 05/16/2023   Encounter for removal of sutures 05/16/2023   Impacted cerumen, bilateral 05/16/2023   Other atopic dermatitis 05/16/2023   Snoring 03/19/2023   Stage 3a chronic kidney disease (HCC) 01/10/2023   Contact with and (suspected) exposure to other hazardous substances 07/26/2022   Mixed conductive and sensorineural hearing loss, bilateral 07/26/2022   Other seborrheic keratosis 07/26/2022   Benign neoplasm of colon 07/19/2020   Chronic sinusitis 07/19/2020   Glaucoma 07/19/2020   Diabetic polyneuropathy associated with type 2 diabetes mellitus (HCC) 10/27/2019   Sensory ataxia 10/27/2019   Poorly controlled type 2 diabetes mellitus with peripheral neuropathy (HCC) 09/02/2019   Obesity, Class I, BMI 30-34.9 12/07/2017   DDD (degenerative disc disease), lumbar 05/16/2016    Advance directive discussed with patient 07/28/2015   Gouty arthropathy, chronic, without tophi 07/08/2015   Hypertension 07/08/2015   Obstructive sleep apnea 07/22/2014   Routine general medical examination at a health care facility 06/06/2011   BPH with obstruction/lower urinary tract symptoms    Generalized osteoarthritis of multiple sites 09/20/2006   Hyperlipidemia 09/17/2006   Allergic rhinitis 09/17/2006   Diverticulosis of colon 09/17/2006    Past Surgical History:  Procedure Laterality Date   TOOTH EXTRACTION         Home Medications    Prior to Admission medications   Medication Sig Start Date End Date Taking? Authorizing Provider  amoxicillin-clavulanate (AUGMENTIN) 875-125 MG tablet Take 1 tablet by mouth every 12 (twelve) hours. 06/08/23  Yes Jermiya Reichl R, NP  benzonatate (TESSALON) 100 MG capsule Take 1 capsule (100 mg total) by mouth every 8 (eight) hours. 06/08/23  Yes Marialice Newkirk, Elita Boone, NP  promethazine-dextromethorphan (PROMETHAZINE-DM) 6.25-15 MG/5ML syrup Take 2.5 mLs by mouth at bedtime as needed. 06/08/23  Yes Alwilda Gilland R, NP  albuterol (VENTOLIN HFA) 108 (90 Base) MCG/ACT inhaler Inhale 2 puffs into the lungs every 4 (four) hours as needed. Patient not taking: Reported on 03/19/2023    [provider]  Alpha-Lipoic Acid 600 MG CAPS Take 1 capsule (600 mg total) by mouth in the morning and at bedtime. 10/27/19   Anson Fret, MD  aspirin 81 MG tablet Take 81 mg by mouth daily.    [provider]  brimonidine (ALPHAGAN) 0.2 % ophthalmic solution Place 1 drop into both eyes 2 (two) times daily.  06/03/11   [provider]  cetirizine (ZYRTEC) 10 MG tablet Take 10 mg by mouth daily.    [provider]  colchicine 0.6 MG tablet TAKE 1 TABLET (0.6 MG TOTAL) BY MOUTH 2 (TWO) TIMES DAILY. WITH 2 AT THE ONSET OF PAIN. CONTINUE FOR 5 DAYS AFTER 04/12/23   Tillman Abide I, MD  COMBIGAN 0.2-0.5 % ophthalmic solution Apply 1  drop to eye 2 (two) times daily. 03/26/23   [provider]  Dulaglutide (TRULICITY) 1.5 MG/0.5ML SOPN Inject 1.5 mg into the skin once a week. 12/27/22   Carlus Pavlov, MD  febuxostat (ULORIC) 40 MG tablet TAKE 1 TABLET BY MOUTH EVERY DAY 05/15/23   Karie Schwalbe, MD  fluticasone [facility]) 50 MCG/ACT nasal spray  08/14/14   [provider]  gabapentin (NEURONTIN) 300 MG capsule Take 2 capsules (600 mg total) by mouth 2 (two) times daily. 06/05/23   Karie Schwalbe, MD  glipiZIDE (GLUCOTROL XL) 5 MG 24 hr tablet TAKE 1 TABLET BY MOUTH IN THE MORNING AND 1 TAB AT DINNERTIME 04/13/23   Carlus Pavlov, MD  glucose blood (ONE TOUCH ULTRA TEST) test strip Use as instructed to check sugar 2 times daily 09/22/16   Carlus Pavlov, MD  indomethacin (INDOCIN) 50 MG capsule Take 1 capsule (50 mg total) by mouth 2 (two) times daily with a meal. Patient taking differently: Take 50 mg by mouth 2 (two) times daily as needed. 06/28/15   Hyatt, Max T, DPM  insulin degludec (TRESIBA FLEXTOUCH) 200 UNIT/ML FlexTouch Pen Inject 12-14 Units into the skin daily. 01/30/23   Carlus Pavlov, MD  Insulin Pen Needle (BD PEN NEEDLE NANO 2ND GEN) 32G X 4 MM MISC USE ONCE DAILY 07/20/22   Carlus Pavlov, MD  latanoprost (XALATAN) 0.005 % ophthalmic solution Place 1 drop into both eyes at bedtime.  04/01/11   [provider]  lisinopril (PRINIVIL,ZESTRIL) 20 MG tablet Take 10 mg by mouth daily.    [provider]  metFORMIN (GLUCOPHAGE) 1000 MG tablet Take 1,000 mg by mouth 2 (two) times daily with a meal.    [provider]  Misc Natural Products (GLUCOSAMINE CHONDROITIN ADV PO) Take 2 tablets by mouth 2 (two) times daily.    [provider]  Multiple Vitamin (MULTIVITAMIN) tablet Take 1 tablet by mouth daily.    [provider]  mupirocin ointment (BACTROBAN) 2 % Apply to wound twice a day. 02/14/17   Hyatt, Max T, DPM  Omega-3 Fatty Acids (FISH OIL) 1200  MG CAPS  02/08/18   [provider]  pravastatin (PRAVACHOL) 20 MG tablet Take 10 mg by mouth daily.  05/22/11   [provider]  sildenafil (REVATIO) 20 MG tablet TAKE 3 TO 5 TABLETS BY MOUTH ONCE DAILY AS NEEDED 06/19/17   Tillman Abide I, MD  tamsulosin (FLOMAX) 0.4 MG CAPS capsule TAKE 1 CAPSULE BY MOUTH EVERY DAY 04/12/23   Karie Schwalbe, MD  triamcinolone ointment (KENALOG) 0.5 % Apply 1 application topically 2 (two) times daily. 11/14/16   Jarold Motto, PA  Vitamin D, Cholecalciferol, 1000 UNITS CAPS Take 1 capsule by mouth daily.    [provider]    Family History Family History  Problem Relation Age of Onset   Colon cancer Father    Stroke Mother    Heart attack Mother    Coronary artery disease Mother  Hypertension Other        several siblings   Diabetes Other        several siblings   Coronary artery disease Brother    ALS Sister    Dementia Sister    Neuropathy Neg Hx     Social History Social History   Tobacco Use   Smoking status: Never   Smokeless tobacco: Never  Vaping Use   Vaping status: Never Used  Substance Use Topics   Alcohol use: No    Alcohol/week: 0.0 standard drinks of alcohol   Drug use: No     Allergies   Patient has no known allergies.   Review of Systems Review of Systems   Physical Exam Triage Vital Signs ED Triage Vitals [06/08/23 1307]  Encounter Vitals Group     BP 139/83     Systolic BP Percentile      Diastolic BP Percentile      Pulse Rate 80     Resp 18     Temp 98 F (36.7 C)     Temp Source Oral     SpO2 94 %     Weight      Height      Head Circumference      Peak Flow      Pain Score      Pain Loc      Pain Education      Exclude from Growth Chart    No data found.  Updated Vital Signs BP 139/83 (BP Location: Left Arm)   Pulse 80   Temp 98 F (36.7 C) (Oral)   Resp 18   SpO2 94%   Visual Acuity Right Eye Distance:   Left Eye Distance:   Bilateral Distance:     Right Eye Near:   Left Eye Near:    Bilateral Near:     Physical Exam Constitutional:      Appearance: Normal appearance.  HENT:     Head: Normocephalic.     Right Ear: Tympanic membrane, ear canal and external ear normal.     Left Ear: Tympanic membrane, ear canal and external ear normal.     Nose: Congestion present. No rhinorrhea.     Mouth/Throat:     Pharynx: No oropharyngeal exudate or posterior oropharyngeal erythema.  Eyes:     Extraocular Movements: Extraocular movements intact.  Cardiovascular:     Rate and Rhythm: Normal rate.     Pulses: Normal pulses.     Heart sounds: Normal heart sounds.  Pulmonary:     Effort: Pulmonary effort is normal.     Breath sounds: Normal breath sounds.  Musculoskeletal:     Cervical back: Normal range of motion and neck supple.  Neurological:     Mental Status: He is alert and oriented to person, place, and time. Mental status is at baseline.     UC Treatments / Results  Labs (all labs ordered are listed, but only abnormal results are displayed) Labs Reviewed - No data to display  EKG   Radiology No results found.  Procedures Procedures (including critical care time)  Medications Ordered in UC Medications - No data to display  Initial Impression / Assessment and Plan / UC Course  I have reviewed the triage vital signs and the nursing notes.  Pertinent labs & imaging results that were available during my care of the patient were reviewed by me and considered in my medical decision making (see chart for details).  Acute URI,  Injury of head  Patient is in no signs of distress nor toxic appearing.  Vital signs are stable.  Low suspicion for pneumonia, pneumothorax or bronchitis and therefore will defer imaging. Prescribed augmentin, promethazine dm, tessalon. May use additional over-the-counter medications as needed for supportive care.  May follow-up with urgent care as needed if symptoms persist or worsen.   Final  Clinical Impressions(s) / UC Diagnoses   Final diagnoses:  Acute URI     Discharge Instructions      Begin  Augmentin every morning and every evening for 7 days to cover for bacteria  You may use Tessalon pill every 8 hours, may use cough syrup at bedtime to help you sleep  No neurological changes on your exam that would indicate alterations to your brain due to your fall  You can take Tylenol and/or Ibuprofen as needed for fever reduction and pain relief.   For cough: honey 1/2 to 1 teaspoon (you can dilute the honey in water or another fluid).  You can also use guaifenesin and dextromethorphan for cough. You can use a humidifier for chest congestion and cough.  If you don't have a humidifier, you can sit in the bathroom with the hot shower running.      For sore throat: try warm salt water gargles, cepacol lozenges, throat spray, warm tea or water with lemon/honey, popsicles or ice, or OTC cold relief medicine for throat discomfort.   For congestion: take a daily anti-histamine like Zyrtec, Claritin, and a oral decongestant, such as pseudoephedrine.  You can also use Flonase 1-2 sprays in each nostril daily.   It is important to stay hydrated: drink plenty of fluids (water, gatorade/powerade/pedialyte, juices, or teas) to keep your throat moisturized and help further relieve irritation/discomfort.    ED Prescriptions     Medication Sig Dispense Auth. Provider   amoxicillin-clavulanate (AUGMENTIN) 875-125 MG tablet Take 1 tablet by mouth every 12 (twelve) hours. 14 tablet Tiena Manansala R, NP   benzonatate (TESSALON) 100 MG capsule Take 1 capsule (100 mg total) by mouth every 8 (eight) hours. 21 capsule Cintya Daughety R, NP   promethazine-dextromethorphan (PROMETHAZINE-DM) 6.25-15 MG/5ML syrup Take 2.5 mLs by mouth at bedtime as needed. 118 mL Jenipher Havel, Elita Boone, NP      PDMP not reviewed this encounter.   Valinda Hoar, [place] 06/08/23 [phone]

## 2023-06-08 NOTE — ED Triage Notes (Signed)
 Patient presents to [facility] for evaluation of URI symptoms, has been around his brother who's caregiver is in the hospital for pneumonia.  Fall in bathroom on Tuesday.  Patient decides today he is willing to be seen by medical personnel is why they decided to come in today.

## 2023-06-08 NOTE — Discharge Instructions (Signed)
 Begin  Augmentin every morning and every evening for 7 days to cover for bacteria  You may use Tessalon pill every 8 hours, may use cough syrup at bedtime to help you sleep  No neurological changes on your exam that would indicate alterations to your brain due to your fall  You can take Tylenol and/or Ibuprofen as needed for fever reduction and pain relief.   For cough: honey 1/2 to 1 teaspoon (you can dilute the honey in water or another fluid).  You can also use guaifenesin and dextromethorphan for cough. You can use a humidifier for chest congestion and cough.  If you don't have a humidifier, you can sit in the bathroom with the hot shower running.      For sore throat: try warm salt water gargles, cepacol lozenges, throat spray, warm tea or water with lemon/honey, popsicles or ice, or OTC cold relief medicine for throat discomfort.   For congestion: take a daily anti-histamine like Zyrtec, Claritin, and a oral decongestant, such as pseudoephedrine.  You can also use Flonase 1-2 sprays in each nostril daily.   It is important to stay hydrated: drink plenty of fluids (water, gatorade/powerade/pedialyte, juices, or teas) to keep your throat moisturized and help further relieve irritation/discomfort.

## 2023-06-14 ENCOUNTER — Other Ambulatory Visit: Payer: Self-pay | Admitting: Internal Medicine

## 2023-06-15 NOTE — Telephone Encounter (Signed)
 Medication refill request complete

## 2023-06-28 ENCOUNTER — Encounter: Payer: Self-pay | Admitting: Adult Health

## 2023-06-28 ENCOUNTER — Ambulatory Visit: Payer: Medicare Other | Admitting: Adult Health

## 2023-06-28 VITALS — BP 112/60 | HR 95 | Ht 71.0 in | Wt 209.4 lb

## 2023-06-28 DIAGNOSIS — G4733 Obstructive sleep apnea (adult) (pediatric): Secondary | ICD-10-CM | POA: Diagnosis not present

## 2023-06-28 NOTE — Progress Notes (Signed)
 @Patient  ID: Lucas [name], male    DOB: 09-09-1940, 83 y.o.   MRN: [id number]  Chief Complaint  Patient presents with   Follow-up    Referring provider: Karie Schwalbe, MD  HPI: 83 year old male seen for sleep consult December 2024 to establish for sleep apnea Former patient of Dr. 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  Patient was having difficulty with snoring and daytime sleepiness.  He was set up for a sleep study that was completed May 07, 2023 that showed moderate to severe sleep apnea with AHI 28.2/hour and SpO2 low at 76%.  He was recommended to begin on CPAP therapy.  Patient says he started on CPAP.  He does say that the first week he started it was a little difficult to tolerate but over the last couple weeks it is really helped out a lot.  Says he has had significant benefit with decreased sleepiness and feels that he is gotten a much better night sleep.  He also is had improvement in the mask that he is using.  Initially it was leaking really bad now it seems to work a lot better.  Currently using a fullface mask.  Patient is using [employer].  CPAP download shows excellent compliance with daily average usage at 5 hours.  Patient is on auto CPAP 5 to 15 cm H2O.  Daily average pressure at 10.8 cm H2O.  AHI 8.4/hour.  AHI has improved over the last week.  Mask leaks have also decreased some    No Known Allergies  Immunization History  Administered Date(s) Administered   Fluad Quad(high Dose 65+) 01/09/2019, 03/18/2021, 03/12/2022   Fluad Trivalent(High Dose 65+) 01/10/2023   H1N1 03/25/2008   Hep A, Unspecified 07/27/2003   Influenza  Split 12/10/2010, 03/06/2012   Influenza, High Dose Seasonal PF 01/21/2015   Influenza, Seasonal, Injecte, Preservative Fre 03/02/2009, 04/26/2010, 01/31/2011   Influenza,inj,Quad PF,6+ Mos 12/31/2012, 01/13/2014, 01/18/2015, 01/20/2016, 01/17/2017   Influenza-Unspecified 02/10/2003, 01/26/2004, 03/06/2005, 02/26/2006, 03/05/2007, 02/25/2008, 03/10/2012, 02/08/2013, 01/13/2014, 04/10/2016, 01/17/2017, 12/17/2017, 03/11/2019, 01/11/2020, 12/09/2020   PFIZER Comirnaty(Gray Top)Covid-19 Tri-Sucrose Vaccine 09/26/2020   PFIZER(Purple Top)SARS-COV-2 Vaccination 05/05/2019, 05/27/2019, 02/08/2020, 09/26/2020   Pfizer Covid-19 Vaccine Bivalent Booster 66yrs & up 03/11/2021   Pfizer(Comirnaty)Fall Seasonal Vaccine 12 years and older 02/26/2022, 01/04/2023   Pneumococcal Conjugate-13 04/15/2011, 04/14/2013   Pneumococcal Polysaccharide-23 02/10/2003, 04/10/2005, 07/13/2016   Respiratory Syncytial Virus Vaccine,Recomb Aduvanted(Arexvy) 04/27/2022   Td 04/12/1999, 12/17/2020   Tdap 05/24/2011   Yellow Fever 07/27/2003   Zoster Recombinant(Shingrix) 04/16/2017, 06/15/2017, 07/12/2017   Zoster, Live 03/02/2008, 03/25/2008    Past Medical History:  Diagnosis Date   Allergic rhinitis    Diabetes mellitus type II    Diverticulosis of colon    Glaucoma    peripheral vision loss left eye.    History of agent Orange exposure    HLD (hyperlipidemia)    HTN (hypertension)    Hypertrophy of prostate without urinary obstruction and other lower urinary tract symptoms (LUTS)    Osteoarthritis     Tobacco History: Social History   Tobacco Use  Smoking Status Never  Smokeless Tobacco Never   Counseling given: Not Answered   Outpatient Medications  Prior to Visit  Medication Sig Dispense Refill   Alpha-Lipoic Acid 600 MG CAPS Take 1 capsule (600 mg total) by mouth in the morning and at bedtime. 60 capsule 3   aspirin 81 MG tablet Take 81 mg by mouth daily.     benzonatate (TESSALON) 100 MG capsule  Take 1 capsule (100 mg total) by mouth every 8 (eight) hours. 21 capsule 0   brimonidine (ALPHAGAN) 0.2 % ophthalmic solution Place 1 drop into both eyes 2 (two) times daily.      cetirizine (ZYRTEC) 10 MG tablet Take 10 mg by mouth daily.     colchicine 0.6 MG tablet TAKE 1 TABLET (0.6 MG TOTAL) BY MOUTH 2 (TWO) TIMES DAILY. WITH 2 AT THE ONSET OF PAIN. CONTINUE FOR 5 DAYS AFTER 180 tablet 0   COMBIGAN 0.2-0.5 % ophthalmic solution Apply 1 drop to eye 2 (two) times daily.     Dulaglutide (TRULICITY) 1.5 MG/0.5ML SOAJ INJECT 1.5 MG INTO THE SKIN ONCE A WEEK. 6 mL 5   febuxostat (ULORIC) 40 MG tablet TAKE 1 TABLET BY MOUTH EVERY DAY 90 tablet 3   fluticasone (FLONASE) 50 MCG/ACT nasal spray      gabapentin (NEURONTIN) 300 MG capsule Take 2 capsules (600 mg total) by mouth 2 (two) times daily. 360 capsule 3   glipiZIDE (GLUCOTROL XL) 5 MG 24 hr tablet TAKE 1 TABLET BY MOUTH IN THE MORNING AND 1 TAB AT DINNERTIME 180 tablet 1   glucose blood (ONE TOUCH ULTRA TEST) test strip Use as instructed to check sugar 2 times daily 200 each 5   indomethacin (INDOCIN) 50 MG capsule Take 1 capsule (50 mg total) by mouth 2 (two) times daily with a meal. (Patient taking differently: Take 50 mg by mouth 2 (two) times daily as needed.) 60 capsule 1   insulin degludec (TRESIBA FLEXTOUCH) 200 UNIT/ML FlexTouch Pen Inject 12-14 Units into the skin daily. 9 mL 1   Insulin Pen Needle (BD PEN NEEDLE NANO 2ND GEN) 32G X 4 MM MISC USE ONCE DAILY 200 each 3   latanoprost (XALATAN) 0.005 % ophthalmic solution Place 1 drop into both eyes at bedtime.      lisinopril (PRINIVIL,ZESTRIL) 20 MG tablet Take 10 mg by mouth daily.     metFORMIN (GLUCOPHAGE) 1000 MG tablet Take 1,000 mg by mouth 2 (two) times daily with a meal.     Misc Natural Products (GLUCOSAMINE CHONDROITIN ADV PO) Take 2 tablets by mouth 2 (two) times daily.     Multiple Vitamin (MULTIVITAMIN) tablet Take 1 tablet by mouth daily.     mupirocin ointment (BACTROBAN)  2 % Apply to wound twice a day. 30 g 2   Omega-3 Fatty Acids (FISH OIL) 1200 MG CAPS      pravastatin (PRAVACHOL) 20 MG tablet Take 10 mg by mouth daily.      promethazine-dextromethorphan (PROMETHAZINE-DM) 6.25-15 MG/5ML syrup Take 2.5 mLs by mouth at bedtime as needed. 118 mL 0   sildenafil (REVATIO) 20 MG tablet TAKE 3 TO 5 TABLETS BY MOUTH ONCE DAILY AS NEEDED 50 tablet 11   tamsulosin (FLOMAX) 0.4 MG CAPS capsule TAKE 1 CAPSULE BY MOUTH EVERY DAY 90 capsule 3   triamcinolone ointment (KENALOG) 0.5 % Apply 1 application topically 2 (two) times daily. 30 g 0   Vitamin D, Cholecalciferol, 1000 UNITS CAPS Take 1 capsule by mouth daily.     albuterol (VENTOLIN HFA) 108 (90 Base) MCG/ACT inhaler Inhale 2 puffs into the lungs every 4 (four)  hours as needed. (Patient not taking: Reported on 06/28/2023)     amoxicillin-clavulanate (AUGMENTIN) 875-125 MG tablet Take 1 tablet by mouth every 12 (twelve) hours. 14 tablet 0   No facility-administered medications prior to visit.     Review of Systems:   Constitutional:   No  weight loss, night sweats,  Fevers, chills, fatigue, or  lassitude.  HEENT:   No headaches,  Difficulty swallowing,  Tooth/dental problems, or  Sore throat,                No sneezing, itching, ear ache, nasal congestion, post nasal drip,   CV:  No chest pain,  Orthopnea, PND, swelling in lower extremities, anasarca, dizziness, palpitations, syncope.   GI  No heartburn, indigestion, abdominal pain, nausea, vomiting, diarrhea, change in bowel habits, loss of appetite, bloody stools.   Resp: No shortness of breath with exertion or at rest.  No excess mucus, no productive cough,  No non-productive cough,  No coughing up of blood.  No change in color of mucus.  No wheezing.  No chest wall deformity  Skin: no rash or lesions.  GU: no dysuria, change in color of urine, no urgency or frequency.  No flank pain, no hematuria   MS:  No joint pain or swelling.  No decreased range of  motion.  No back pain.    Physical Exam  BP 112/60 (BP Location: Left Arm, Patient Position: Sitting)   Pulse 95   Ht 5\' 11"  (1.803 m)   Wt 209 lb 6.4 oz (95 kg)   SpO2 100%   BMI 29.21 kg/m   GEN: A/Ox3; pleasant , NAD, well nourished    HEENT:  [place]/AT,   NOSE-clear, THROAT-clear, no lesions, no postnasal drip or exudate noted.   NECK:  Supple w/ fair ROM; no JVD; normal carotid impulses w/o bruits; no thyromegaly or nodules palpated; no lymphadenopathy.    RESP  Clear  P & A; w/o, wheezes/ rales/ or rhonchi. no accessory muscle use, no dullness to percussion  CARD:  RRR, no m/r/g, no peripheral edema, pulses intact, no cyanosis or clubbing.  GI:   Soft & nt; nml bowel sounds; no organomegaly or masses detected.   Musco: Warm bil, no deformities or joint swelling noted.   Neuro: alert, no focal deficits noted.    Skin: Warm, no lesions or rashes    Lab Results:      BNP No results found for: "BNP"  ProBNP No results found for: "PROBNP"  Imaging: No results found.  Administration History     None           No data to display          No results found for: "NITRICOXIDE"      Assessment & Plan:   Obstructive sleep apnea Moderate obstructive sleep apnea patient had perceived benefit since starting CPAP.  Seems to be tolerating better over the last week.  Will continue on current settings.  Follow-up in 3 months.  Plan Patient Instructions  Continue on CPAP at bedtime, wear all night long for at least 6 or more hours Work on healthy sleep regimen  Do not drive if sleepy  Follow up in 3 months and As needed        Rubye Oaks, NP 06/28/2023

## 2023-06-28 NOTE — Patient Instructions (Signed)
 Continue on CPAP at bedtime, wear all night long for at least 6 or more hours Work on healthy sleep regimen  Do not drive if sleepy  Follow up in 3 months and As needed

## 2023-06-28 NOTE — Assessment & Plan Note (Signed)
 Moderate obstructive sleep apnea patient had perceived benefit since starting CPAP.  Seems to be tolerating better over the last week.  Will continue on current settings.  Follow-up in 3 months.  Plan Patient Instructions  Continue on CPAP at bedtime, wear all night long for at least 6 or more hours Work on healthy sleep regimen  Do not drive if sleepy  Follow up in 3 months and As needed

## 2023-07-19 ENCOUNTER — Telehealth: Payer: Self-pay

## 2023-07-19 NOTE — Telephone Encounter (Signed)
 Copied from CRM [id number]. Topic: Clinical - Medical Advice >> Jul 19, 2023 11:48 AM Renie Ora wrote: Reason for CRM: Patient called in and stated he has received his sleep machine. Patient stated he has some bruising on his nose from the mask. Patient stated he needs something to pad it with around his nose or an idea of what he can use between his nose and the pad.  Please advise

## 2023-07-19 NOTE — Telephone Encounter (Signed)
 Most likely too tight, can discuss with DME different mask or if they have any suggestions. Typically not able to put anything between mask as will cause mask leak. They do make CPAP mask covers which can be purchased on [employer] and cpap.com however that may not prevent bruising there more for skin irritation from the silicone of the mask.

## 2023-07-25 NOTE — Telephone Encounter (Signed)
 I called and spoke to pt. Pt informed of Tammy's note and verbalized understanding, NFN

## 2023-08-14 ENCOUNTER — Ambulatory Visit (INDEPENDENT_AMBULATORY_CARE_PROVIDER_SITE_OTHER): Payer: Medicare Other | Admitting: Internal Medicine

## 2023-08-14 ENCOUNTER — Encounter: Payer: Self-pay | Admitting: Internal Medicine

## 2023-08-14 VITALS — BP 120/70 | HR 74 | Ht 71.0 in | Wt 212.4 lb

## 2023-08-14 DIAGNOSIS — E1165 Type 2 diabetes mellitus with hyperglycemia: Secondary | ICD-10-CM

## 2023-08-14 DIAGNOSIS — Z7984 Long term (current) use of oral hypoglycemic drugs: Secondary | ICD-10-CM | POA: Diagnosis not present

## 2023-08-14 DIAGNOSIS — Z7985 Long-term (current) use of injectable non-insulin antidiabetic drugs: Secondary | ICD-10-CM

## 2023-08-14 DIAGNOSIS — E785 Hyperlipidemia, unspecified: Secondary | ICD-10-CM

## 2023-08-14 DIAGNOSIS — Z794 Long term (current) use of insulin: Secondary | ICD-10-CM

## 2023-08-14 DIAGNOSIS — E663 Overweight: Secondary | ICD-10-CM | POA: Diagnosis not present

## 2023-08-14 DIAGNOSIS — E1142 Type 2 diabetes mellitus with diabetic polyneuropathy: Secondary | ICD-10-CM | POA: Diagnosis not present

## 2023-08-14 LAB — POCT GLYCOSYLATED HEMOGLOBIN (HGB A1C): Hemoglobin A1C: 7.3 % — AB (ref 4.0–5.6)

## 2023-08-14 NOTE — Patient Instructions (Addendum)
 Please continue: - Metformin 2000 mg with dinner - Glipizide  XL 5 mg in a.m. and 5 mg before dinner (try to chew the tablet) - Tresiba  10 units daily - Trulicity  1.5 mg weekly   Please return in 3-4 months with your sugar log.

## 2023-08-14 NOTE — Progress Notes (Signed)
 Patient ID: Lucas [name], male   DOB: June 18, 1940, 83 y.o.   MRN: [id number]  HPI: Lucas [name] is a 83 y.o.-year-old male, presenting for f/u for DM2, dx in 1998, insulin -dependent (previous Agent Orange exposure in [place]), uncontrolled, with complications (mild CKD, foot ulcer). Last visit 4 months ago.  Interim hx: No blurry vision, nausea, chest pain.   He started a CPAP machine 2 mo ago >> feeling better on this. Is having dietary discretions at night: Candy, cookies.  Sugars are higher at night.  Reviewed HbA1c levels: Lab Results  Component Value Date   HGBA1C 7.2 (A) 04/16/2023   HGBA1C 7.2 (A) 08/25/2022   HGBA1C 7.4 (A) 04/26/2022   HGBA1C 7.3 (A) 12/21/2021   HGBA1C 6.9 (A) 08/17/2021   HGBA1C 7.1 (A) 04/19/2021   HGBA1C 6.9 (A) 12/14/2020   HGBA1C 6.7 (A) 07/05/2020   HGBA1C 7.8 (A) 03/01/2020   HGBA1C 7.8 (A) 10/27/2019   HGBA1C 8.2 (A) 07/21/2019   HGBA1C 7.6 (A) 03/17/2019   HGBA1C 8.1 (H) 09/24/2018   HGBA1C 7.9 (A) 03/18/2018   HGBA1C 7.6 (A) 12/07/2017   HGBA1C 7.8 08/06/2017   HGBA1C 7.4 05/10/2017   HGBA1C 8.3 11/29/2016   HGBA1C 7.7 05/08/2016   HGBA1C 7.7 12/17/2015   HGBA1C 8.8 (H) 07/28/2015   HGBA1C 7.2 02/05/2015   HGBA1C 6.7 11/05/2014   HGBA1C 9.1 (A) 06/29/2014   HGBA1C 8.7 (H) 01/13/2014  12/15/2022: HbA1c 8.2%  Pt is on a regimen of: - Metformin 2000 mg with dinner - Glipizide  XL 10 >> 5 mg before breakfast and 5 mg before dinner - Trulicity  1.5 >> 3 mg weekly >> tried Ozempic  >> constipation >> off for 3 mo >> restarted Trulicity  1.5 mg weekly - Tresiba  12 - started 02/2020 >> 20 >> 12 >> 12-14 >> 12.5 >> he decreased by himself to 8 units daily >> increased to 10 >> 12 >>  10 units daily He had to stop Farxiga  as K increased (took 1 mo, off for 1.5 mo). Invokana  was not covered.  Pt checks his sugars 1-2 times a day - am: 101, 136-173 >> 88, 91-156, 177, 185 >> 118-165, 182 - 2h after b'fast:  233 >> n/c >> 292 >> n/c - before  lunch:  107, 140 >> 126 >> 113-154 >> 92 - 2h after lunch: 114-137 >> n/c >> 119 >> 156 >> n/c - before dinner: 123-138 >> n/c  >> 165 >> n/c >> 193, 205 - 2h after dinner:  179 >> 148, 218>> 132, 166-239, 256 - bedtime:   137 >> n/c >> 161 >> 180 >> n/c - nighttime: n/c >> 165 >> n/c >> 117 >> n/c Lowest sugar was 65 >> .Lucas [name].107 >> 101 >> 88; he has hypoglycemia awareness in the 70s. Highest sugar was 292 >>... 180 >> 218.  Glucometer: Precision >> One Touch Ultra mini >> Livongo >> BioTel  Pt's meals are: - Breakfast:  Oatmeal, wheat English muffin  - Lunch: salad, 1/2 sandwich - Dinner: meat + veggies, added carbs back - Snacks: 2x a day, fruit bar, fresh fruit - snacking after dinner  + Mild CKD: Lab Results  Component Value Date   BUN 33 (H) 12/24/2022   CREATININE 1.29 (H) 12/24/2022   12/15/2022: ACR 70.96 (contaminated specimen)-he reportedly had another ACR afterwards which was normal. Lab Results  Component Value Date   MICRALBCREAT 1.9 09/21/2021   MICRALBCREAT 0.7 08/19/2014   MICRALBCREAT 0.6 06/06/2011  01/04/2022: 19/1.26, GFR 58, glucose 138,  ACR 13.59 04/16/2017: 15/1.2 07/13/2016: BUN/Cr 13/1.0, Glu 116, UACR 0.5 On lisinopril 10 mg daily.  + HL; last set of lipids: [place] [facility] 182/287/37/98 [facility] 173/390/32/86 Lab Results  Component Value Date   CHOL 166 09/21/2021   HDL 36.70 (L) 09/21/2021   LDLCALC 104 07/13/2016   LDLDIRECT 80.0 09/21/2021   TRIG 333.0 (H) 09/21/2021   CHOLHDL 5 09/21/2021  04/16/2016: 179/288/44/77 04/15/2015: LDL 89 On pravastatin 20. On fish oil 1000 mg daily >> increased to 2x a day 12/2020.  - last dilated eye exam was in 12/2022 [facility]): No DR reportedly, + glaucoma. 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  - + numbness and tingling in his feet.  He is on alpha-lipoic acid by neurology.  He also sees podiatry.  Latest foot exam was done by Lucas [name] on  01/10/2023.  He was in a DM clinical trial before: "Jump Start"  lifestyle Research Trial. He was on the [employer] and lifestyle program in the past.  He also has a history of HTN, ED. He has gout - prev. on Alopurinol >> then  on Uloric(he had a rash with allopurinol) He had a R foot ulcer 04/2017 and again in 11/2017. He is seen by a vascular dr.  Andris [name] were normal then. In 11/2017 he was getting Krysexxa injections for gout.  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 He had recurrent falls before last visit and was found to have orthostatic hypotension. His BP med doses were changed.  ROS: + see HPI  I reviewed pt's medications, allergies, PMH, social hx, family hx, and changes were documented in the history of present illness. Otherwise, unchanged from my initial visit note.  Past Medical History:  Diagnosis Date   Allergic rhinitis    Diabetes mellitus type II    Diverticulosis of colon    Glaucoma    peripheral vision loss left eye.    History of agent Orange exposure    HLD (hyperlipidemia)    HTN (hypertension)    Hypertrophy of prostate without urinary obstruction and other lower urinary tract symptoms (LUTS)    Osteoarthritis    Past Surgical History:  Procedure Laterality Date   TOOTH EXTRACTION      History   Social History   Marital Status: Married    Spouse Name: N/A   Number of Children: 2   Occupational History   Retired-[employer]    Social History Main Topics   Smoking status: Never Smoker    Smokeless tobacco: Never Used   Alcohol Use: No   Drug Use: No    [place] vet- 20% disability from shrapnel wounds 1967 and 20% agent orange   Married (Widowed 1996; remarried 6/03)   Retired [employer]      Has living will   Wife, then daughter Lucas [name] will be health care POA   Would want attempts at resuscitation   Would not want feeding tube if cognitively unaware   Current Outpatient Medications on File Prior to Visit  Medication  Sig Dispense Refill   albuterol (VENTOLIN HFA) 108 (90 Base) MCG/ACT inhaler Inhale 2 puffs into the lungs every 4 (four) hours as needed. (Patient not taking: Reported on 06/28/2023)     Alpha-Lipoic Acid 600 MG CAPS Take 1 capsule (600 mg total) by mouth in the morning and at bedtime. 60 capsule 3   amoxicillin -clavulanate (AUGMENTIN )  875-125 MG tablet Take 1 tablet by mouth every 12 (twelve) hours. 14 tablet 0   aspirin 81 MG tablet Take 81 mg by mouth daily.     benzonatate  (TESSALON ) 100 MG capsule Take 1 capsule (100 mg total) by mouth every 8 (eight) hours. 21 capsule 0   brimonidine (ALPHAGAN) 0.2 % ophthalmic solution Place 1 drop into both eyes 2 (two) times daily.      cetirizine (ZYRTEC) 10 MG tablet Take 10 mg by mouth daily.     colchicine  0.6 MG tablet TAKE 1 TABLET (0.6 MG TOTAL) BY MOUTH 2 (TWO) TIMES DAILY. WITH 2 AT THE ONSET OF PAIN. CONTINUE FOR 5 DAYS AFTER 180 tablet 0   COMBIGAN 0.2-0.5 % ophthalmic solution Apply 1 drop to eye 2 (two) times daily.     Dulaglutide  (TRULICITY ) 1.5 MG/0.5ML SOAJ INJECT 1.5 MG INTO THE SKIN ONCE A WEEK. 6 mL 5   febuxostat (ULORIC) 40 MG tablet TAKE 1 TABLET BY MOUTH EVERY DAY 90 tablet 3   fluticasone (FLONASE) 50 MCG/ACT nasal spray      gabapentin  (NEURONTIN ) 300 MG capsule Take 2 capsules (600 mg total) by mouth 2 (two) times daily. 360 capsule 3   glipiZIDE  (GLUCOTROL  XL) 5 MG 24 hr tablet TAKE 1 TABLET BY MOUTH IN THE MORNING AND 1 TAB AT DINNERTIME 180 tablet 1   glucose blood (ONE TOUCH ULTRA TEST) test strip Use as instructed to check sugar 2 times daily 200 each 5   indomethacin  (INDOCIN ) 50 MG capsule Take 1 capsule (50 mg total) by mouth 2 (two) times daily with a meal. (Patient taking differently: Take 50 mg by mouth 2 (two) times daily as needed.) 60 capsule 1   insulin  degludec (TRESIBA  FLEXTOUCH) 200 UNIT/ML FlexTouch Pen Inject 12-14 Units into the skin daily. 9 mL 1   Insulin  Pen Needle (BD PEN NEEDLE NANO 2ND GEN) 32G X 4 MM  MISC USE ONCE DAILY 200 each 3   latanoprost (XALATAN) 0.005 % ophthalmic solution Place 1 drop into both eyes at bedtime.      lisinopril (PRINIVIL,ZESTRIL) 20 MG tablet Take 10 mg by mouth daily.     metFORMIN (GLUCOPHAGE) 1000 MG tablet Take 1,000 mg by mouth 2 (two) times daily with a meal.     Misc Natural Products (GLUCOSAMINE CHONDROITIN ADV PO) Take 2 tablets by mouth 2 (two) times daily.     Multiple Vitamin (MULTIVITAMIN) tablet Take 1 tablet by mouth daily.     mupirocin  ointment (BACTROBAN ) 2 % Apply to wound twice a day. 30 g 2   Omega-3 Fatty Acids (FISH OIL) 1200 MG CAPS      pravastatin (PRAVACHOL) 20 MG tablet Take 10 mg by mouth daily.      promethazine -dextromethorphan (PROMETHAZINE -DM) 6.25-15 MG/5ML syrup Take 2.5 mLs by mouth at bedtime as needed. 118 mL 0   sildenafil  (REVATIO ) 20 MG tablet TAKE 3 TO 5 TABLETS BY MOUTH ONCE DAILY AS NEEDED 50 tablet 11   tamsulosin  (FLOMAX ) 0.4 MG CAPS capsule TAKE 1 CAPSULE BY MOUTH EVERY DAY 90 capsule 3   triamcinolone  ointment (KENALOG ) 0.5 % Apply 1 application topically 2 (two) times daily. 30 g 0   Vitamin D , Cholecalciferol, 1000 UNITS CAPS Take 1 capsule by mouth daily.     No current facility-administered medications on file prior to visit.   No Known Allergies Family History  Problem Relation Age of Onset   Colon cancer Father    Stroke Mother  Heart attack Mother    Coronary artery disease Mother    Hypertension Other        several siblings   Diabetes Other        several siblings   Coronary artery disease Brother    ALS Sister    Dementia Sister    Neuropathy Neg Hx    PE: BP 120/70   Pulse 74   Ht 5\' 11"  (1.803 m)   Wt 212 lb 6.4 oz (96.3 kg)   SpO2 98%   BMI 29.62 kg/m   Wt Readings from Last 3 Encounters:  08/14/23 212 lb 6.4 oz (96.3 kg)  06/28/23 209 lb 6.4 oz (95 kg)  04/16/23 216 lb 3.2 oz (98.1 kg)   Constitutional: overweight, in NAD Eyes:  EOMI, no exophthalmos ENT: no neck masses, no  cervical lymphadenopathy Cardiovascular: RRR, No MRG Respiratory: CTA B Musculoskeletal: no deformities Skin:no rashes Neurological: no tremor with outstretched hands  ASSESSMENT: 1. DM2, insulin -dependent, uncontrolled, with complications - R foot ulcer. Nl ABIs - mild CKD  2. HL  3.  Overweight  PLAN:  1. Patient with longstanding, uncontrolled, type 2 diabetes, on long-acting insulin  along with metformin, sulfonylurea, and GLP-1 receptor agonist, with the lower HbA1c at last visit, down to 7.2%.  At that time, sugars were lower, only with occasional hyperglycemic spikes in the morning and occasionally later in the day.  He was not checking very frequently later in the day.  We discussed about potentially increasing the dose of Tresiba  back to 12 units daily but otherwise I did not change his regimen. - At today's visit, sugars are mostly at goal in the morning with occasional hyperglycemic spikes, but today are higher, up to the 200s in the evening, after dinner.  He is taking metformin at bedtime and glipizide  before dinner.  We discussed about chewing the glipizide  XL tablet to allow it to act better on dinner, and to try to take it approximately 20 to 30 minutes before the meal.  We also discussed about trying to improve dinner, by cutting out sweets.  Will continue the rest of the regimen for now. - I suggested to:  Patient Instructions  Please continue: - Metformin 2000 mg with dinner - Glipizide  XL 5 mg in a.m. and 5 mg before dinner (try to chew the tablet) - Tresiba  10 units daily - Trulicity  1.5 mg weekly   Please return in 3-4 months with your sugar log.   - we checked his HbA1c: 7.3% (higher) - advised to check sugars at different times of the day - 1x a day, rotating check times - advised for yearly eye exams >> he is UTD - he had an elevated ACR in 12/2022 but he tells me that this was due to a contaminated sample as per communication with [facility].  He had a repeat ACR  reportedly, which was normal.  Previous ACR's were also normal. - return to clinic in 3-4 months  2. HL - Lipid panel was reviewed from 12/2022: LDL above goal, as were his triglycerides - He continued pravastatin 20 mg daily and omega-3 fatty acids  -no side effects  3.  Overweight -He was on Trulicity  3 mg weekly previously, but we had to switch to Ozempic  due to lack of availability.  However, he was not able to tolerate this so he went back to Trulicity  1.5 mg weekly -he gained 10 pounds before the last 2 visits combined - he lost 4 pounds since last visit  Emilie Harden, MD PhD [facility] Endocr[facility]

## 2023-08-15 ENCOUNTER — Ambulatory Visit (INDEPENDENT_AMBULATORY_CARE_PROVIDER_SITE_OTHER): Payer: Medicare Other | Admitting: Podiatry

## 2023-08-15 ENCOUNTER — Encounter: Payer: Self-pay | Admitting: Podiatry

## 2023-08-15 DIAGNOSIS — B351 Tinea unguium: Secondary | ICD-10-CM

## 2023-08-15 DIAGNOSIS — E1142 Type 2 diabetes mellitus with diabetic polyneuropathy: Secondary | ICD-10-CM | POA: Diagnosis not present

## 2023-08-15 DIAGNOSIS — R0989 Other specified symptoms and signs involving the circulatory and respiratory systems: Secondary | ICD-10-CM

## 2023-08-15 DIAGNOSIS — M79676 Pain in unspecified toe(s): Secondary | ICD-10-CM

## 2023-08-15 DIAGNOSIS — Z9189 Other specified personal risk factors, not elsewhere classified: Secondary | ICD-10-CM | POA: Insufficient documentation

## 2023-08-15 NOTE — Progress Notes (Signed)
 Lucas [name] presents today for follow-up of his diabetes and toenails.  Objective: Vital signs are stable he is alert and oriented x 3 pulses are palpable.  Diminished sensorium per Semmes Weinstein monofilament toenails are slightly elongated and thickened.  Assessment: Diabetes mellitus with diabetic peripheral neuropathy digital deformities and nail dystrophy's.  Plan: Debrided toenails 1 through 5 bilaterally and follow-up with Lucas [name] in 3 months.

## 2023-09-10 DIAGNOSIS — H401223 Low-tension glaucoma, left eye, severe stage: Secondary | ICD-10-CM | POA: Diagnosis not present

## 2023-09-10 DIAGNOSIS — H4720 Unspecified optic atrophy: Secondary | ICD-10-CM | POA: Diagnosis not present

## 2023-09-26 ENCOUNTER — Ambulatory Visit: Attending: Podiatry

## 2023-09-26 DIAGNOSIS — R0989 Other specified symptoms and signs involving the circulatory and respiratory systems: Secondary | ICD-10-CM | POA: Diagnosis not present

## 2023-09-26 DIAGNOSIS — E1142 Type 2 diabetes mellitus with diabetic polyneuropathy: Secondary | ICD-10-CM | POA: Diagnosis not present

## 2023-09-26 LAB — VAS US ABI WITH/WO TBI
Left ABI: 1.04
Right ABI: 0.94

## 2023-09-27 ENCOUNTER — Ambulatory Visit: Payer: Self-pay | Admitting: Podiatry

## 2023-09-30 ENCOUNTER — Other Ambulatory Visit: Payer: Self-pay | Admitting: Internal Medicine

## 2023-09-30 DIAGNOSIS — E1165 Type 2 diabetes mellitus with hyperglycemia: Secondary | ICD-10-CM

## 2023-10-03 ENCOUNTER — Other Ambulatory Visit: Payer: Self-pay | Admitting: Internal Medicine

## 2023-10-22 ENCOUNTER — Other Ambulatory Visit: Payer: Self-pay | Admitting: Internal Medicine

## 2023-10-23 ENCOUNTER — Telehealth: Payer: Self-pay

## 2023-10-23 ENCOUNTER — Other Ambulatory Visit (HOSPITAL_COMMUNITY): Payer: Self-pay

## 2023-10-23 NOTE — Telephone Encounter (Signed)
 Pharmacy Patient Advocate Encounter  Received notification from [facility] that Prior Authorization for Febuxostat 40 tabs has been APPROVED from 04/11/23 to 10/22/24. Unable to obtain price due to refill too soon rejection, last fill date 10/23/23 next available fill date 12/30/23   PA #/Case ID/Reference #: [id number]

## 2023-10-23 NOTE — Telephone Encounter (Signed)
 Pharmacy Patient Advocate Encounter   Received notification from Onbase that prior authorization for Febuxostat 40 tabs is required/requested.   Insurance verification completed.   The patient is insured through [facility] .   Per test claim: PA required; PA submitted to above mentioned insurance via CoverMyMeds Key/confirmation #/EOC [facility] Status is pending

## 2023-11-14 ENCOUNTER — Ambulatory Visit: Admitting: Podiatry

## 2023-11-14 ENCOUNTER — Encounter: Payer: Self-pay | Admitting: Podiatry

## 2023-11-15 NOTE — Telephone Encounter (Signed)
 Appointment 12/03/2023 Dr. Verta.

## 2023-12-03 ENCOUNTER — Ambulatory Visit (INDEPENDENT_AMBULATORY_CARE_PROVIDER_SITE_OTHER): Admitting: Podiatry

## 2023-12-03 DIAGNOSIS — B351 Tinea unguium: Secondary | ICD-10-CM | POA: Diagnosis not present

## 2023-12-03 DIAGNOSIS — M79676 Pain in unspecified toe(s): Secondary | ICD-10-CM

## 2023-12-03 DIAGNOSIS — E1142 Type 2 diabetes mellitus with diabetic polyneuropathy: Secondary | ICD-10-CM

## 2023-12-03 NOTE — Progress Notes (Signed)
 Lucas [name] presents today for follow-up of his diabetes and toenails.  Objective: Vital signs are stable he is alert and oriented x 3 pulses are palpable.  Diminished sensorium per Semmes Weinstein monofilament toenails are slightly elongated and thickened.  Assessment: Diabetes mellitus with diabetic peripheral neuropathy digital deformities and nail dystrophy's.  Plan: Debrided toenails 1 through 5 bilaterally and follow-up with Lucas [name] in 3 months.

## 2023-12-18 ENCOUNTER — Encounter: Payer: Self-pay | Admitting: Internal Medicine

## 2023-12-18 ENCOUNTER — Ambulatory Visit (INDEPENDENT_AMBULATORY_CARE_PROVIDER_SITE_OTHER): Admitting: Internal Medicine

## 2023-12-18 VITALS — BP 120/70 | HR 75 | Ht 71.0 in | Wt 209.8 lb

## 2023-12-18 DIAGNOSIS — E1165 Type 2 diabetes mellitus with hyperglycemia: Secondary | ICD-10-CM | POA: Diagnosis not present

## 2023-12-18 DIAGNOSIS — E785 Hyperlipidemia, unspecified: Secondary | ICD-10-CM

## 2023-12-18 DIAGNOSIS — E663 Overweight: Secondary | ICD-10-CM

## 2023-12-18 DIAGNOSIS — Z794 Long term (current) use of insulin: Secondary | ICD-10-CM | POA: Diagnosis not present

## 2023-12-18 LAB — POCT GLYCOSYLATED HEMOGLOBIN (HGB A1C): Hemoglobin A1C: 7.4 % — AB (ref 4.0–5.6)

## 2023-12-18 NOTE — Progress Notes (Signed)
 Patient ID: Lucas [name], male   DOB: 1941/03/03, 83 y.o.   MRN: [id number]  HPI: Lucas [name] is a 83 y.o.-year-old male, presenting for f/u for DM2, dx in 1998, insulin -dependent (previous Agent Orange exposure in [place]), uncontrolled, with complications (mild CKD, foot ulcer). Last visit 4 months ago.  Interim hx: No blurry vision, nausea, chest pain. He continues to have dietary discretions, mainly at night.  Reviewed HbA1c levels: Lab Results  Component Value Date   HGBA1C 7.3 (A) 08/14/2023   HGBA1C 7.2 (A) 04/16/2023   HGBA1C 7.2 (A) 08/25/2022   HGBA1C 7.4 (A) 04/26/2022   HGBA1C 7.3 (A) 12/21/2021   HGBA1C 6.9 (A) 08/17/2021   HGBA1C 7.1 (A) 04/19/2021   HGBA1C 6.9 (A) 12/14/2020   HGBA1C 6.7 (A) 07/05/2020   HGBA1C 7.8 (A) 03/01/2020   HGBA1C 7.8 (A) 10/27/2019   HGBA1C 8.2 (A) 07/21/2019   HGBA1C 7.6 (A) 03/17/2019   HGBA1C 8.1 (H) 09/24/2018   HGBA1C 7.9 (A) 03/18/2018   HGBA1C 7.6 (A) 12/07/2017   HGBA1C 7.8 08/06/2017   HGBA1C 7.4 05/10/2017   HGBA1C 8.3 11/29/2016   HGBA1C 7.7 05/08/2016   HGBA1C 7.7 12/17/2015   HGBA1C 8.8 (H) 07/28/2015   HGBA1C 7.2 02/05/2015   HGBA1C 6.7 11/05/2014   HGBA1C 9.1 (A) 06/29/2014  12/15/2022: HbA1c 8.2%  Pt is on a regimen of: - Metformin 2000 mg with dinner >> at bedtime - Glipizide  XL 10 >> 5 mg before breakfast and 5 mg before dinner - Trulicity  1.5 >> 3 mg weekly >> tried Ozempic  >> constipation >> off for 3 mo >> restarted Trulicity  1.5 mg weekly - Tresiba  12 - started 02/2020 >> 20 >> 12 >> 12-14 >> 12.5 >> he decreased by himself to 8 units daily >> increased to 10 >> 12 >>  10 units daily He had to stop Farxiga  as K increased (took 1 mo, off for 1.5 mo). Invokana  was not covered.  Pt checks his sugars 1-2 times a day - am: 88, 91-156, 177, 185 >> 118-165, 182 >> 87-159, 174, 199 - 2h after b'fast:  233 >> n/c >> 292 >> n/c - before lunch:  107, 140 >> 126 >> 113-154 >> 92 >> n/c - 2h after lunch: 114-137 >>  n/c >> 119 >> 156 >> n/c - before dinner: 123-138 >> n/c  >> 165 >> n/c >> 193, 205 >> n/c - 2h after dinner:  179 >> 148, 218>> 132, 166-239, 256 >> n/c - bedtime:   137 >> n/c >> 161 >> 180 >> n/c - nighttime: n/c >> 165 >> n/c >> 117 >> n/c Lowest sugar was 65 >> [name] >> 101 >> 88 >> 87; he has hypoglycemia awareness in the 70s. Highest sugar was 292 >>... 180 >> 218 >> 199 (icecream).  Glucometer: Precision >> One Touch Ultra mini >> Livongo >> BioTel  Pt's meals are: - Breakfast:  Oatmeal, wheat English muffin  - Lunch: salad, 1/2 sandwich - Dinner: meat + veggies, added carbs back - Snacks: 2x a day, fruit bar, fresh fruit - snacking after dinner  + Mild CKD: Lab Results  Component Value Date   BUN 33 (H) 12/24/2022   CREATININE 1.29 (H) 12/24/2022   12/15/2022: ACR 70.96 (contaminated specimen)-he reportedly had another ACR afterwards which was normal. No results found for: MICRALBCREAT 01/04/2022: 19/1.26, GFR 58, glucose 138, ACR 13.59 04/16/2017: 15/1.2 07/13/2016: BUN/Cr 13/1.0, Glu 116, ACR 0.5 On lisinopril 10 mg daily.  + HL; last set of  lip[place]: [facility] 182/287/37/98 [facility] 173/390/32/86 Lab Results  Component Value Date   CHOL 166 09/21/2021   HDL 36.70 (L) 09/21/2021   LDLCALC 104 07/13/2016   LDLDIRECT 80.0 09/21/2021   TRIG 333.0 (H) 09/21/2021   CHOLHDL 5 09/21/2021  04/16/2016: 179/288/44/77 04/15/2015: LDL 89 On pravastatin 20. On fish oil 1000 mg daily >> increased to 2x a day 12/2020.  - last dilated eye exam was in 12/2022 [facility]): No DR reportedly, + glaucoma. 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  - + numbness and tingling in his feet.  He is on alpha-lipoic acid by neurology.  He also sees podiatry.  Latest foot exam was done by Dr. Verta on 12/03/2023.  He was in a DM clinical trial before: Jump Start  lifestyle Research Trial. He was on the [employer] and lifestyle  program in the past.  He also has a history of HTN, ED. He has gout - prev. on Alopurinol >> then  on Uloric(he had a rash with allopurinol) He had a R foot ulcer 04/2017 and again in 11/2017. He is seen by a vascular dr.  Hosey were normal then. In 11/2017 he was getting Krysexxa injections for gout.  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 He had recurrent falls before last visit and was found to have orthostatic hypotension. His BP med doses were changed.  ROS: + see HPI  I reviewed pt's medications, allergies, PMH, social hx, family hx, and changes were documented in the history of present illness. Otherwise, unchanged from my initial visit note.  Past Medical History:  Diagnosis Date   Allergic rhinitis    Diabetes mellitus type II    Diverticulosis of colon    Glaucoma    peripheral vision loss left eye.    History of agent Orange exposure    HLD (hyperlipidemia)    HTN (hypertension)    Hypertrophy of prostate without urinary obstruction and other lower urinary tract symptoms (LUTS)    Osteoarthritis    Past Surgical History:  Procedure Laterality Date   TOOTH EXTRACTION      History   Social History   Marital Status: Married    Spouse Name: N/A   Number of Children: 2   Occupational History   Retired-[employer]    Social History Main Topics   Smoking status: Never Smoker    Smokeless tobacco: Never Used   Alcohol Use: No   Drug Use: No    [place] vet- 20% disability from shrapnel wounds 1967 and 20% agent orange   Married (Widowed 1996; remarried 6/03)   Retired [employer]      Has living will   Wife, then daughter Lucas [name] will be health care POA   Would want attempts at resuscitation   Would not want feeding tube if cognitively unaware   Current Outpatient Medications on File Prior to Visit  Medication Sig Dispense Refill   albuterol (VENTOLIN HFA) 108 (90 Base) MCG/ACT inhaler Inhale 2 puffs into the lungs every 4 (four) hours as  needed.     Alpha-Lipoic Acid 600 MG CAPS Take 1 capsule (600 mg total) by mouth in the morning and at bedtime. 60 capsule 3   aspirin 81 MG tablet Take 81 mg by mouth daily.     BD PEN NEEDLE NANO 2ND GEN 32G X 4 MM MISC USE ONCE DAILY AS DIRECTED  100 each 7   benzonatate  (TESSALON ) 100 MG capsule Take 1 capsule (100 mg total) by mouth every 8 (eight) hours. 21 capsule 0   brimonidine (ALPHAGAN) 0.2 % ophthalmic solution Place 1 drop into both eyes 2 (two) times daily.      cetirizine (ZYRTEC) 10 MG tablet Take 10 mg by mouth daily.     colchicine  0.6 MG tablet TAKE 1 TABLET (0.6 MG TOTAL) BY MOUTH 2 (TWO) TIMES DAILY. WITH 2 AT THE ONSET OF PAIN. CONTINUE FOR 5 DAYS AFTER 180 tablet 0   COMBIGAN 0.2-0.5 % ophthalmic solution Apply 1 drop to eye 2 (two) times daily.     Dulaglutide  (TRULICITY ) 1.5 MG/0.5ML SOAJ INJECT 1.5 MG INTO THE SKIN ONCE A WEEK. 6 mL 5   febuxostat (ULORIC) 40 MG tablet TAKE 1 TABLET BY MOUTH EVERY DAY 90 tablet 3   fluticasone (FLONASE) 50 MCG/ACT nasal spray      gabapentin  (NEURONTIN ) 300 MG capsule Take 2 capsules (600 mg total) by mouth 2 (two) times daily. 360 capsule 3   glipiZIDE  (GLUCOTROL  XL) 5 MG 24 hr tablet TAKE 1 TABLET BY MOUTH IN THE MORNING AND 1 TAB AT DINNERTIME 180 tablet 2   glucose blood (ONE TOUCH ULTRA TEST) test strip Use as instructed to check sugar 2 times daily 200 each 5   indomethacin  (INDOCIN ) 50 MG capsule Take 1 capsule (50 mg total) by mouth 2 (two) times daily with a meal. (Patient taking differently: Take 50 mg by mouth 2 (two) times daily as needed.) 60 capsule 1   Insulin  Degludec FlexTouch 200 UNIT/ML SOPN Inject 10 Units into the skin daily. 9 mL 2   latanoprost (XALATAN) 0.005 % ophthalmic solution Place 1 drop into both eyes at bedtime.      lisinopril (PRINIVIL,ZESTRIL) 20 MG tablet Take 10 mg by mouth daily.     metFORMIN (GLUCOPHAGE) 1000 MG tablet Take 1,000 mg by mouth 2 (two) times daily with a meal.     Misc Natural Products  (GLUCOSAMINE CHONDROITIN ADV PO) Take 2 tablets by mouth 2 (two) times daily.     Multiple Vitamin (MULTIVITAMIN) tablet Take 1 tablet by mouth daily.     mupirocin  ointment (BACTROBAN ) 2 % Apply to wound twice a day. 30 g 2   Omega-3 Fatty Acids (FISH OIL) 1200 MG CAPS      pravastatin (PRAVACHOL) 20 MG tablet Take 10 mg by mouth daily.      promethazine -dextromethorphan (PROMETHAZINE -DM) 6.25-15 MG/5ML syrup Take 2.5 mLs by mouth at bedtime as needed. 118 mL 0   sildenafil  (REVATIO ) 20 MG tablet TAKE 3 TO 5 TABLETS BY MOUTH ONCE DAILY AS NEEDED 50 tablet 11   tamsulosin  (FLOMAX ) 0.4 MG CAPS capsule TAKE 1 CAPSULE BY MOUTH EVERY DAY 90 capsule 3   triamcinolone  ointment (KENALOG ) 0.5 % Apply 1 application topically 2 (two) times daily. 30 g 0   Vitamin D , Cholecalciferol, 1000 UNITS CAPS Take 1 capsule by mouth daily.     No current facility-administered medications on file prior to visit.   No Known Allergies Family History  Problem Relation Age of Onset   Colon cancer Father    Stroke Mother    Heart attack Mother    Coronary artery disease Mother    Hypertension Other        several siblings   Diabetes Other        several siblings   Coronary artery disease Brother    ALS Sister  Dementia Sister    Neuropathy Neg Hx    PE: BP 120/70   Pulse 75   Ht 5' 11 (1.803 m)   Wt 209 lb 12.8 oz (95.2 kg)   SpO2 97%   BMI 29.26 kg/m   Wt Readings from Last 3 Encounters:  12/18/23 209 lb 12.8 oz (95.2 kg)  08/14/23 212 lb 6.4 oz (96.3 kg)  06/28/23 209 lb 6.4 oz (95 kg)   Constitutional: overweight, in NAD Eyes:  EOMI, no exophthalmos ENT: no neck masses, no cervical lymphadenopathy Cardiovascular: RRR, No MRG Respiratory: CTA B Musculoskeletal: no deformities Skin:no rashes Neurological: no tremor with outstretched hands  ASSESSMENT: 1. DM2, insulin -dependent, uncontrolled, with complications - R foot ulcer. Nl ABIs - mild CKD  2. HL  3.  Overweight  PLAN:  1.  Patient with longstanding, uncontrolled, type 2 diabetes, on oral medication regimen with metformin, sulfonylurea, and also long-acting insulin  and weekly GLP-1 receptor agonist, with an HbA1c of 7.3%, higher, at last visit.  At that time, sugars were mostly at goal in the morning with occasional hyperglycemic spikes and up to 200s in the evening, after dinner.  He was taking metformin at bedtime and glipizide  before dinner.  We discussed about using the glipizide  XL tablet to allow it to act better on dinner, and to try to take it approximately 20 to 30 minutes before the meal.  We also discussed about trying to improve dinner, by cutting out sweets.  We did not change the rest of the regimen. - At today's visit, sugars are at or slightly above target in the morning but he is not checking later in the day.  He is frustrated about having to write down the sugars, but I explained that it would be valuable to have information about whether some sugars are checked after meals and some before meals. -At today's visit, since the HbA1c is higher, we discussed about potentially increasing the Trulicity  dose, but he feels that blood sugars are higher due to his dietary indiscretions and he would want to work on this for now.  Therefore, we will continue the current regimen. -He gets his metformin refilled from the [facility].  He is up-to-date with the rest of the prescriptions. - I suggested to:  Patient Instructions  Please continue: - Metformin 2000 mg with dinner - Glipizide  XL 5 mg in a.m. and 5 mg before dinner (try to chew the tablet) - Tresiba  10 units daily - Trulicity  1.5 mg weekly   Please return in 3-4 months with your sugar log.   - we checked his HbA1c: 7.4% (slightly higher) - advised to check sugars at different times of the day - 1x a day, rotating check times - advised for yearly eye exams >> he is UTD - he has annual labs again at the [facility] tomorrow - return to clinic in 3-4 months  2. HL -  Reviewed latest lipid panel from 12/2022: LDL and triglycerides above goal - Continues statin 20 mg daily and omega-3 fatty acids without side effects. - He is due for another lipid panel, will have this tomorrow at the [facility]  3.  Overweight -He was on Trulicity  3 mg weekly previously, but we had to switch to Ozempic  due to lack of availability.  However, he was not able to tolerate this so he went back to Trulicity  1.5 mg weekly - He lost 4 pounds before last visit and lost 3 pounds since then  Lela Fendt, MD PhD [facility] Endocrinology

## 2023-12-18 NOTE — Patient Instructions (Addendum)
 Please continue: - Metformin 2000 mg with dinner - Glipizide  XL 5 mg in a.m. and 5 mg before dinner - Tresiba  10 units daily - Trulicity  1.5 mg weekly  Continue to work on [employer].   Please return in 3-4 months with your sugar log.

## 2024-01-04 ENCOUNTER — Other Ambulatory Visit: Payer: Self-pay

## 2024-01-04 MED ORDER — COLCHICINE 0.6 MG PO TABS
0.6000 mg | ORAL_TABLET | Freq: Two times a day (BID) | ORAL | 0 refills | Status: AC
Start: 2024-01-04 — End: ?

## 2024-01-04 NOTE — Telephone Encounter (Signed)
 LVM Sent Mychart

## 2024-01-04 NOTE — Telephone Encounter (Signed)
 Pt needs to establish care with provider. Rx done.

## 2024-01-07 ENCOUNTER — Telehealth: Payer: Self-pay

## 2024-01-07 NOTE — Telephone Encounter (Signed)
 Copied from CRM #[id number]. Topic: Appointments - Transfer of Care >> Jan 07, 2024 10:48 AM Charolett L wrote: Pt is requesting to transfer FROM: [place] Pt is requesting to transfer TO: [place] Reason for requested transfer: Jimmy It is the responsibility of the team the patient would like to transfer to (Dr. Bennett) to reach out to the patient if for any reason this transfer is not acceptable.

## 2024-01-11 DIAGNOSIS — H401223 Low-tension glaucoma, left eye, severe stage: Secondary | ICD-10-CM | POA: Diagnosis not present

## 2024-01-11 DIAGNOSIS — H2513 Age-related nuclear cataract, bilateral: Secondary | ICD-10-CM | POA: Diagnosis not present

## 2024-01-15 ENCOUNTER — Ambulatory Visit (INDEPENDENT_AMBULATORY_CARE_PROVIDER_SITE_OTHER)

## 2024-01-15 VITALS — BP 120/72 | Ht 71.0 in | Wt 213.0 lb

## 2024-01-15 VITALS — BP 116/70 | HR 62 | Temp 98.0°F | Ht 71.0 in | Wt 212.0 lb

## 2024-01-15 DIAGNOSIS — R140 Abdominal distension (gaseous): Secondary | ICD-10-CM

## 2024-01-15 DIAGNOSIS — K580 Irritable bowel syndrome with diarrhea: Secondary | ICD-10-CM

## 2024-01-15 DIAGNOSIS — Z0000 Encounter for general adult medical examination without abnormal findings: Secondary | ICD-10-CM | POA: Diagnosis not present

## 2024-01-15 MED ORDER — LOPERAMIDE HCL 2 MG PO TABS: ORAL_TABLET | ORAL | 0 refills | Status: AC

## 2024-01-15 MED ORDER — SIMETHICONE 80 MG PO TABS: 1.0000 | ORAL_TABLET | Freq: Four times a day (QID) | ORAL | 0 refills | Status: AC | PRN

## 2024-01-15 NOTE — Patient Instructions (Signed)
 Mr. Kandel,  Thank you for taking the time for your Medicare Wellness Visit. I appreciate your continued commitment to your health goals. Please review the care plan we discussed, and feel free to reach out if I can assist you further.  Medicare recommends these wellness visits once per year to help you and your care team stay ahead of potential health issues. These visits are designed to focus on prevention, allowing your provider to concentrate on managing your acute and chronic conditions during your regular appointments.  Please note that Annual Wellness Visits do not include a physical exam. Some assessments may be limited, especially if the visit was conducted virtually. If needed, we may recommend a separate in-person follow-up with your provider.  Ongoing Care Seeing your primary care provider every 3 to 6 months helps us  monitor your health and provide consistent, personalized care.   Referrals If a referral was made during today's visit and you haven't received any updates within two weeks, please contact the referred provider directly to check on the status.  Recommended Screenings:  Health Maintenance  Topic Date Due   Yearly kidney health urinalysis for diabetes  Never done   Eye exam for diabetics  07/22/2022   Flu Shot  11/09/2023   COVID-19 Vaccine (8 - Pfizer risk 2024-25 season) 12/10/2023   Yearly kidney function blood test for diabetes  12/24/2023   Complete foot exam   01/10/2024   Hemoglobin A1C  06/16/2024   Medicare Annual Wellness Visit  01/14/2025   DTaP/Tdap/Td vaccine (4 - Td or Tdap) 12/18/2030   Pneumococcal Vaccine for age over 37  Completed   Zoster (Shingles) Vaccine  Completed   Meningitis B Vaccine  Aged Out       12/24/2022    9:54 AM  Advanced Directives  Does Patient Have a Medical Advance Directive? Yes  Type of Advance Directive Healthcare Power of [facility]   Advance Care Planning is important because it: Ensures you receive medical care  that aligns with your values, goals, and preferences. Provides guidance to your family and loved ones, reducing the emotional burden of decision-making during critical moments.  Vision: Annual vision screenings are recommended for early detection of glaucoma, cataracts, and diabetic retinopathy. These exams can also reveal signs of chronic conditions such as diabetes and high blood pressure.  Dental: Annual dental screenings help detect early signs of oral cancer, gum disease, and other conditions linked to overall health, including heart disease and diabetes.  Please see the attached documents for additional preventive care recommendations.

## 2024-01-15 NOTE — Patient Instructions (Addendum)
 Thank you for visiting [facility] today! Here's what we talked about: - Use imodium daily for diarrhea - Use Simethicone or Gas X as needed for bloating

## 2024-01-15 NOTE — Progress Notes (Unsigned)
 Subjective:    Patient ID: Lucas [name], male    DOB: 25-Aug-1940, 83 y.o.   MRN: [id number]   Lucas [name] is a very pleasant 83 y.o. male who presents today abdominal issues. Stomach feels upset for about month, Sunday ate beans then had pain and bloating in stomach, accident on way to bathroom Monday lost control of bowels, often pasty loose stools, no blood in stool, no black stool, no N/V, no fever/chills, Bms everyday.  Has been constipated in the past.   Stopped allopurinol about 2wks ago because thought its contrributing. Cereal and fruits give him sx, ice cream in the past never gave him symptoms,  Has low appetite since dentures because reduced taste sensation in mouth,  Today has had 2-3 loose Bms,  No heartburn,   IBS, lactose intolerant, celiac,   Review of Systems  All other systems reviewed and are negative.       No Known Allergies  Current Outpatient Medications on File Prior to Visit  Medication Sig Dispense Refill   albuterol (VENTOLIN HFA) 108 (90 Base) MCG/ACT inhaler Inhale 2 puffs into the lungs every 4 (four) hours as needed.     Alpha-Lipoic Acid 600 MG CAPS Take 1 capsule (600 mg total) by mouth in the morning and at bedtime. 60 capsule 3   aspirin 81 MG tablet Take 81 mg by mouth daily.     BD PEN NEEDLE NANO 2ND GEN 32G X 4 MM MISC USE ONCE DAILY AS DIRECTED 100 each 7   brimonidine (ALPHAGAN) 0.2 % ophthalmic solution Place 1 drop into both eyes 2 (two) times daily.      cetirizine (ZYRTEC) 10 MG tablet Take 10 mg by mouth daily.     colchicine  0.6 MG tablet Take 1 tablet (0.6 mg total) by mouth 2 (two) times daily. With 2 at the onset of pain. Continue for 5 days after 180 tablet 0   COMBIGAN 0.2-0.5 % ophthalmic solution Apply 1 drop to eye 2 (two) times daily.     Dulaglutide  (TRULICITY ) 1.5 MG/0.5ML SOAJ INJECT 1.5 MG INTO THE SKIN ONCE A WEEK. 6 mL 5   febuxostat (ULORIC) 40 MG tablet TAKE 1 TABLET BY MOUTH EVERY DAY 90 tablet 3    fluticasone (FLONASE) 50 MCG/ACT nasal spray      gabapentin  (NEURONTIN ) 300 MG capsule Take 2 capsules (600 mg total) by mouth 2 (two) times daily. 360 capsule 3   glipiZIDE  (GLUCOTROL  XL) 5 MG 24 hr tablet TAKE 1 TABLET BY MOUTH IN THE MORNING AND 1 TAB AT DINNERTIME 180 tablet 2   glucose blood (ONE TOUCH ULTRA TEST) test strip Use as instructed to check sugar 2 times daily 200 each 5   indomethacin  (INDOCIN ) 50 MG capsule Take 1 capsule (50 mg total) by mouth 2 (two) times daily with a meal. 60 capsule 1   Insulin  Degludec FlexTouch 200 UNIT/ML SOPN Inject 10 Units into the skin daily. 9 mL 2   latanoprost (XALATAN) 0.005 % ophthalmic solution Place 1 drop into both eyes at bedtime.      lisinopril (PRINIVIL,ZESTRIL) 20 MG tablet Take 10 mg by mouth daily.     metFORMIN (GLUCOPHAGE) 1000 MG tablet Take 1,000 mg by mouth 2 (two) times daily with a meal.     Misc Natural Products (GLUCOSAMINE CHONDROITIN ADV PO) Take 2 tablets by mouth 2 (two) times daily.     Multiple Vitamin (MULTIVITAMIN) tablet Take 1 tablet by mouth daily.  mupirocin  ointment (BACTROBAN ) 2 % Apply to wound twice a day. 30 g 2   Omega-3 Fatty Acids (FISH OIL) 1200 MG CAPS      pravastatin (PRAVACHOL) 20 MG tablet Take 10 mg by mouth daily.      sildenafil  (REVATIO ) 20 MG tablet TAKE 3 TO 5 TABLETS BY MOUTH ONCE DAILY AS NEEDED 50 tablet 11   tamsulosin  (FLOMAX ) 0.4 MG CAPS capsule TAKE 1 CAPSULE BY MOUTH EVERY DAY 90 capsule 3   triamcinolone  ointment (KENALOG ) 0.5 % Apply 1 application topically 2 (two) times daily. 30 g 0   Vitamin D , Cholecalciferol, 1000 UNITS CAPS Take 1 capsule by mouth daily.     No current facility-administered medications on file prior to visit.    BP 116/70 (BP Location: Left Arm, Patient Position: Sitting, Cuff Size: Large)   Pulse 62   Temp 98 F (36.7 C) (Oral)   Ht 5' 11 (1.803 m)   Wt 212 lb (96.2 kg)   SpO2 99%   BMI 29.57 kg/m   Objective:    Physical Exam Vitals and  nursing note reviewed.  Constitutional:      Appearance: Normal appearance.  HENT:     Head: Normocephalic and atraumatic.  Eyes:     Extraocular Movements: Extraocular movements intact.     Conjunctiva/sclera: Conjunctivae normal.  Skin:    General: Skin is warm.  Neurological:     Mental Status: He is alert.  Psychiatric:        Mood and Affect: Mood normal.        Behavior: Behavior normal.            Assessment & Plan:     No follow-ups on file.   Siennah Barrasso K Latiffany Harwick, MD  01/15/24

## 2024-01-15 NOTE — Progress Notes (Addendum)
 Subjective:   Lucas [name] is a 83 y.o. who presents for a Medicare Wellness preventive visit.  As a reminder, Annual Wellness Visits don't include a physical exam, and some assessments may be limited, especially if this visit is performed virtually. We may recommend an in-person follow-up visit with your provider if needed.  Visit Complete: In person  Persons Participating in Visit: Patient.  AWV Questionnaire: No: Patient Medicare AWV questionnaire was not completed prior to this visit.  Cardiac Risk Factors include: advanced age (>15men, >66 women);diabetes mellitus;dyslipidemia;male gender;hypertension     Objective:    Today's Vitals   01/15/24 1045  BP: 120/72  Weight: 213 lb (96.6 kg)  Height: 5' 11 (1.803 m)   Body mass index is 29.71 kg/m.     01/15/2024   11:09 AM 12/24/2022    9:54 AM 12/24/2022    8:15 AM 08/18/2016    8:30 AM 07/28/2015   10:11 AM 11/09/2014    3:36 PM  Advanced Directives  Does Patient Have a Medical Advance Directive? Yes Yes Yes Yes  Yes  Yes   Type of Estate agent of [place];Living will Healthcare Power of [employer] of [place];Living will Living will;Healthcare Power of [employer] Power of [place];Living will   Does patient want to make changes to medical advance directive?    No - Patient declined  No - Patient declined  No - Patient declined   Copy of Healthcare Power of Attorney in Chart? No - copy requested   No - copy requested  No - copy requested  No - copy requested      Data saved with a previous flowsheet row definition    Current Medications (verified) Outpatient Encounter Medications as of 01/15/2024  Medication Sig   albuterol (VENTOLIN HFA) 108 (90 Base) MCG/ACT inhaler Inhale 2 puffs into the lungs every 4 (four) hours as needed.   Alpha-Lipoic Acid 600 MG CAPS Take 1 capsule (600 mg total) by mouth in the morning and at bedtime.   aspirin 81 MG tablet Take 81 mg by  mouth daily.   BD PEN NEEDLE NANO 2ND GEN 32G X 4 MM MISC USE ONCE DAILY AS DIRECTED   brimonidine (ALPHAGAN) 0.2 % ophthalmic solution Place 1 drop into both eyes 2 (two) times daily.    cetirizine (ZYRTEC) 10 MG tablet Take 10 mg by mouth daily.   colchicine  0.6 MG tablet Take 1 tablet (0.6 mg total) by mouth 2 (two) times daily. With 2 at the onset of pain. Continue for 5 days after   COMBIGAN 0.2-0.5 % ophthalmic solution Apply 1 drop to eye 2 (two) times daily.   Dulaglutide  (TRULICITY ) 1.5 MG/0.5ML SOAJ INJECT 1.5 MG INTO THE SKIN ONCE A WEEK.   febuxostat (ULORIC) 40 MG tablet TAKE 1 TABLET BY MOUTH EVERY DAY   fluticasone (FLONASE) 50 MCG/ACT nasal spray    gabapentin  (NEURONTIN ) 300 MG capsule Take 2 capsules (600 mg total) by mouth 2 (two) times daily.   glipiZIDE  (GLUCOTROL  XL) 5 MG 24 hr tablet TAKE 1 TABLET BY MOUTH IN THE MORNING AND 1 TAB AT DINNERTIME   glucose blood (ONE TOUCH ULTRA TEST) test strip Use as instructed to check sugar 2 times daily   indomethacin  (INDOCIN ) 50 MG capsule Take 1 capsule (50 mg total) by mouth 2 (two) times daily with a meal.   Insulin  Degludec FlexTouch 200 UNIT/ML SOPN Inject 10 Units into the skin daily.   latanoprost (XALATAN)  0.005 % ophthalmic solution Place 1 drop into both eyes at bedtime.    lisinopril (PRINIVIL,ZESTRIL) 20 MG tablet Take 10 mg by mouth daily.   metFORMIN (GLUCOPHAGE) 1000 MG tablet Take 1,000 mg by mouth 2 (two) times daily with a meal.   Misc Natural Products (GLUCOSAMINE CHONDROITIN ADV PO) Take 2 tablets by mouth 2 (two) times daily.   Multiple Vitamin (MULTIVITAMIN) tablet Take 1 tablet by mouth daily.   mupirocin  ointment (BACTROBAN ) 2 % Apply to wound twice a day.   Omega-3 Fatty Acids (FISH OIL) 1200 MG CAPS    pravastatin (PRAVACHOL) 20 MG tablet Take 10 mg by mouth daily.    sildenafil  (REVATIO ) 20 MG tablet TAKE 3 TO 5 TABLETS BY MOUTH ONCE DAILY AS NEEDED   tamsulosin  (FLOMAX ) 0.4 MG CAPS capsule TAKE 1 CAPSULE  BY MOUTH EVERY DAY   triamcinolone  ointment (KENALOG ) 0.5 % Apply 1 application topically 2 (two) times daily.   Vitamin D , Cholecalciferol, 1000 UNITS CAPS Take 1 capsule by mouth daily.   benzonatate  (TESSALON ) 100 MG capsule Take 1 capsule (100 mg total) by mouth every 8 (eight) hours. (Patient not taking: Reported on 01/15/2024)   promethazine -dextromethorphan (PROMETHAZINE -DM) 6.25-15 MG/5ML syrup Take 2.5 mLs by mouth at bedtime as needed. (Patient not taking: Reported on 01/15/2024)   No facility-administered encounter medications on file as of 01/15/2024.    Allergies (verified) Patient has no known allergies.   History: Past Medical History:  Diagnosis Date   Allergic rhinitis    Cataract    Diabetes mellitus type II    Diverticulosis of colon    Glaucoma    peripheral vision loss left eye.    History of agent Orange exposure    HLD (hyperlipidemia)    HTN (hypertension)    Hypertrophy of prostate without urinary obstruction and other lower urinary tract symptoms (LUTS)    Neuromuscular disorder (HCC)    Osteoarthritis    Sleep apnea    Past Surgical History:  Procedure Laterality Date   TOOTH EXTRACTION     Family History  Problem Relation Age of Onset   Colon cancer Father    Stroke Mother    Heart attack Mother    Coronary artery disease Mother    Diabetes Mother    Hypertension Other        several siblings   Diabetes Other        several siblings   Coronary artery disease Brother    Asthma Brother    Diabetes Brother    ALS Sister    Diabetes Sister    Dementia Sister    Diabetes Brother    Early death Brother    Neuropathy Neg Hx    Social History   Socioeconomic History   Marital status: Married    Spouse name: Not on file   Number of children: Not on file   Years of education: Not on file   Highest education level: Bachelor's degree (e.g., BA, AB, BS)  Occupational History   Occupation: Retired-[employer]  Tobacco Use   Smoking status: Never    Smokeless tobacco: Never  Vaping Use   Vaping status: Never Used  Substance and Sexual Activity   Alcohol use: No   Drug use: No   Sexual activity: Not Currently  Other Topics Concern   Not on file  Social History Narrative   [place] vet- 20% disability from shrapnel wounds 1967 and 20% agent orange   Married (Widowed 1996; remarried 6/03)  Retired [employer]      Has living will   Wife, then daughter Dala will be health care POA   Would want attempts at resuscitation   Would not want feeding tube if cognitively unaware   Social Drivers of Health   Financial Resource Strain: Low Risk  (01/15/2024)   Overall Financial Resource Strain (CARDIA)    Difficulty of Paying Living Expenses: Not hard at all  Food Insecurity: No Food Insecurity (01/15/2024)   Hunger Vital Sign    Worried About Running Out of Food in the Last Year: Never true    Ran Out of Food in the Last Year: Never true  Transportation Needs: No Transportation Needs (01/15/2024)   PRAPARE - Administrator, Civil Service (Medical): No    Lack of Transportation (Non-Medical): No  Physical Activity: Insufficiently Active (01/15/2024)   Exercise Vital Sign    Days of Exercise per Week: 2 days    Minutes of Exercise per Session: 30 min  Stress: No Stress Concern Present (01/15/2024)   [employer] of Occupational Health - Occupational Stress Questionnaire    Feeling of Stress: Only a little  Social Connections: Socially Integrated (01/15/2024)   Social Connection and Isolation Panel    Frequency of Communication with Friends and Family: Three times a week    Frequency of Social Gatherings with Friends and Family: Patient declined    Attends Religious Services: 1 to 4 times per year    Active Member of [employer] or Organizations: No    Attends Engineer, structural: 1 to 4 times per year    Marital Status: Married    Tobacco Counseling Counseling given: Not Answered    Clinical Intake:  Pre-visit  preparation completed: Yes  Pain : No/denies pain     BMI - recorded: 29.71 Nutritional Status: BMI 25 -29 Overweight Nutritional Risks: None Diabetes: Yes CBG done?: Yes (BS 153 this am) CBG resulted in Enter/ Edit results?: No Did pt. bring in CBG monitor from home?: No  Lab Results  Component Value Date   HGBA1C 7.4 (A) 12/18/2023   HGBA1C 7.3 (A) 08/14/2023   HGBA1C 7.2 (A) 04/16/2023     How often do you need to have someone help you when you read instructions, pamphlets, or other written materials from your doctor or pharmacy?: 1 - Never  Interpreter Needed?: No  Comments: lives with wife Information entered by :: B.Katherina Wimer,LPN   Activities of Daily Living     01/15/2024   11:09 AM 01/08/2024   11:28 AM  In your present state of health, do you have any difficulty performing the following activities:  Hearing? 1 1  Vision? 1 1  Difficulty concentrating or making decisions? 0 0  Walking or climbing stairs? 1 1  Dressing or bathing? 0 0  Doing errands, shopping? 0 0  Preparing Food and eating ? N N  Using the Toilet? N N  In the past six months, have you accidently leaked urine? Y Y  Do you have problems with loss of bowel control? Y Y  Managing your Medications? N N  Managing your Finances? N N  Housekeeping or managing your Housekeeping? N N    Patient Care Team: Jimmy Charlie FERNS, MD as PCP - General Robinson Mayo, OD as Referring Physician (Optometry) I have updated your Care Teams any recent Medical Services you may have received from other providers in the past year.     Assessment:   This is a routine  wellness examination for Elsie.  Hearing/Vision screen Hearing Screening - Comments:: Patient has hearing difficulties:wears hearing aids Vision Screening - Comments:: Pt says their vision is good with glasses Dr  Robinson   Goals Addressed             This Visit's Progress    Patient Stated       Stay alive       Depression Screen      01/15/2024   11:06 AM 01/10/2023   10:02 AM 01/10/2023    9:37 AM 09/21/2021   10:20 AM 09/08/2020   12:09 PM 09/08/2020   11:24 AM 09/02/2019    9:54 AM  PHQ 2/9 Scores  PHQ - 2 Score 1 1 0 0 0 0 0    Fall Risk     01/15/2024   10:54 AM 01/08/2024   11:28 AM 01/10/2023   10:02 AM 01/10/2023    9:37 AM 09/21/2021   10:20 AM  Fall Risk   Falls in the past year? 0 1 1 0 1  Number falls in past yr: 0 0 0 0 1  Injury with Fall? 0 0 0 0 0  Risk for fall due to : No Fall Risks   No Fall Risks   Follow up Education provided;Falls prevention discussed   Falls evaluation completed Falls prevention discussed      Data saved with a previous flowsheet row definition    MEDICARE RISK AT HOME:  Medicare Risk at Home Any stairs in or around the home?: (Patient-Rptd) Yes If so, are there any without handrails?: (Patient-Rptd) No Home free of loose throw rugs in walkways, pet beds, electrical cords, etc?: (Patient-Rptd) Yes Adequate lighting in your home to reduce risk of falls?: (Patient-Rptd) Yes Life alert?: (Patient-Rptd) No Use of a cane, walker or w/c?: (Patient-Rptd) Yes Grab bars in the bathroom?: (Patient-Rptd) Yes Shower chair or bench in shower?: (Patient-Rptd) Yes Elevated toilet seat or a handicapped toilet?: (Patient-Rptd) Yes  TIMED UP AND GO:  Was the test performed?  Yes  Length of time to ambulate 10 feet: 25 sec Gait slow and steady with assistive device  Cognitive Function: 6CIT completed        01/15/2024   11:10 AM  6CIT Screen  What Year? 0 points  What month? 0 points  What time? 0 points  Count back from 20 0 points  Months in reverse 0 points  Repeat phrase 0 points  Total Score 0 points    Immunizations Immunization History  Administered Date(s) Administered   Fluad Quad(high Dose 65+) 01/09/2019, 03/18/2021, 03/12/2022   Fluad Trivalent(High Dose 65+) 01/10/2023   H1N1 03/25/2008   Hep A, Unspecified 07/27/2003   INFLUENZA, HIGH DOSE SEASONAL PF  01/21/2015   Influenza Split 12/10/2010, 03/06/2012   Influenza, Seasonal, Injecte, Preservative Fre 03/02/2009, 04/26/2010, 01/31/2011   Influenza,inj,Quad PF,6+ Mos 12/31/2012, 01/13/2014, 01/18/2015, 01/20/2016, 01/17/2017   Influenza-Unspecified 02/10/2003, 01/26/2004, 03/06/2005, 02/26/2006, 03/05/2007, 02/25/2008, 03/10/2012, 02/08/2013, 01/13/2014, 04/10/2016, 01/17/2017, 12/17/2017, 03/11/2019, 01/11/2020, 12/09/2020   PFIZER Comirnaty(Gray Top)Covid-19 Tri-Sucrose Vaccine 09/26/2020   PFIZER(Purple Top)SARS-COV-2 Vaccination 05/05/2019, 05/27/2019, 02/08/2020, 09/26/2020   Pfizer Covid-19 Vaccine Bivalent Booster 29yrs & up 03/11/2021   Pfizer(Comirnaty)Fall Seasonal Vaccine 12 years and older 02/26/2022, 01/04/2023   Pneumococcal Conjugate-13 04/15/2011, 04/14/2013   Pneumococcal Polysaccharide-23 02/10/2003, 04/10/2005, 07/13/2016   Respiratory Syncytial Virus Vaccine,Recomb Aduvanted(Arexvy) 04/27/2022   Td 04/12/1999, 12/17/2020   Tdap 05/24/2011   Yellow Fever 07/27/2003   Zoster Recombinant(Shingrix) 04/16/2017, 06/15/2017, 07/12/2017   Zoster, Live 03/02/2008,  03/25/2008    Screening Tests Health Maintenance  Topic Date Due   Diabetic kidney evaluation - Urine ACR  Never done   OPHTHALMOLOGY EXAM  07/22/2022   Influenza Vaccine  11/09/2023   COVID-19 Vaccine (8 - Pfizer risk 2024-25 season) 12/10/2023   Diabetic kidney evaluation - eGFR measurement  12/24/2023   FOOT EXAM  01/10/2024   HEMOGLOBIN A1C  06/16/2024   Medicare Annual Wellness (AWV)  01/14/2025   DTaP/Tdap/Td (4 - Td or Tdap) 12/18/2030   Pneumococcal Vaccine: 50+ Years  Completed   Zoster Vaccines- Shingrix  Completed   Meningococcal B Vaccine  Aged Out    Health Maintenance Items Addressed: None at this time: will get Influenza at his pharmacy  Additional Screening:  Vision Screening: Recommended annual ophthalmology exams for early detection of glaucoma and other disorders of the eye. Is the  patient up to date with their annual eye exam?  Yes  Who is the provider or what is the name of the office in which the patient attends annual eye exams? Dr Robinson  Dental Screening: Recommended annual dental exams for proper oral hygiene  Community Resource Referral / Chronic Care Management: CRR required this visit?  No   CCM required this visit?  Appt scheduled with PCP   Plan:    I have personally reviewed and noted the following in the patient's chart:   Medical and social history Use of alcohol, tobacco or illicit drugs  Current medications and supplements including opioid prescriptions. Patient is not currently taking opioid prescriptions. Functional ability and status Nutritional status Physical activity Advanced directives List of other physicians Hospitalizations, surgeries, and ER visits in previous 12 months Vitals Screenings to include cognitive, depression, and falls Referrals and appointments  In addition, I have reviewed and discussed with patient certain preventive protocols, quality metrics, and best practice recommendations. A written personalized care plan for preventive services as well as general preventive health recommendations were provided to patient.   Erminio LITTIE Saris, LPN   [id number]/05/[id number]   After Visit Summary: (In Person-Printed) AVS printed and given to the patient  Notes: pt says he is having stomach issues (loose bowels)..needs to be seen.

## 2024-02-08 ENCOUNTER — Ambulatory Visit

## 2024-02-28 ENCOUNTER — Ambulatory Visit

## 2024-02-28 VITALS — BP 104/62 | HR 70 | Temp 98.0°F | Ht 71.0 in | Wt 218.0 lb

## 2024-02-28 DIAGNOSIS — I10 Essential (primary) hypertension: Secondary | ICD-10-CM

## 2024-02-28 DIAGNOSIS — Z0000 Encounter for general adult medical examination without abnormal findings: Secondary | ICD-10-CM

## 2024-02-28 DIAGNOSIS — Z9189 Other specified personal risk factors, not elsewhere classified: Secondary | ICD-10-CM | POA: Diagnosis not present

## 2024-02-28 NOTE — Progress Notes (Signed)
 Assessment & Plan:   This visit was conducted in person. The patient gave informed consent to the use of [employer] AI technology to record the contents of the encounter as documented below.  Assessment & Plan Adult Wellness Visit Routine wellness visit with emphasis on exercise, diet, and vaccinations. I have personally reviewed and have noted: 1. The patient's medical and social history 2. Their use of alcohol, tobacco or illicit drugs 3. Their current medications and supplements 4. The patient's functional ability including ADL's, fall risks, home safety risks and hearing or visual impairment. 5. Diet and physical activities 6. Evidence for depression or mood disorder   Colonoscopy:  Labs: PSA, HIV, Hep C, Lipid reportedly done through [facility], will follow-up on records Immunizations: Needs flu and covid from pharmacy Diet and Exercise: Goes to the gym twice weekly, needs to cut down on candies and desserts Sleep and mood: Improved since being on CPAP Dental and vision:UTD ADLs and IADLs: Capable Cognitive: Intact to orientation, naming, recall and repetition Fall risk and home safety: No concerns Health counselling given: Counseled to mediate sugary food intake in light of recently worsened A1c.    - Encouraged 30-minute walks three times weekly. - Encouraged gym activities including Pilates and weight training.   Essential hypertension Normotensive today, continue current antihypertensive regimen. - Continue lisinopril 20 mg daily   At risk for medication side effect Patient has been taking low-dose aspirin for years now, extensively discussed risks in the elderly population including PUD and GI bleed.  Per chart review there was mild atherosclerosis noted on imaging in 2021, for this reason he would benefit from secondary prevention.  Explained that bleeding risk is further increased with him also being on NSAID indomethacin .  He verbalizes understanding and is okay to  continue.  - Continue aspirin 81 mg daily      Follow-up: Return in about 1 year (around 02/27/2025) for CPE.        Subjective:   Patient ID: Lucas [name], male    DOB: May 26, 1940  Age: 83 y.o. MRN: [id number]  Patient Care Team: Bennett Reuben POUR, MD as PCP - General (Family Medicine) Robinson Mayo, OD as Referring Physician (Optometry)   CC:  Chief Complaint  Patient presents with   Transfer of Care    Previous pt of Dr Jimmy Diarrhea has cleared up. Doing better with his diet and drinking water.      Lucas [name] is a 83 y.o. male here for annual physical and f/u on chronic conditions, see assessment and plan for further details  Discussed the use of AI scribe software for clinical note transcription with the patient, who gave verbal consent to proceed.  History of Present Illness Lucas [name] is an 83 year old male with diabetes and hypertension who presents for an annual physical exam.  He is managed by an endocrinologist for diabetes and is on Trulicity  1.5 mg weekly, metformin 1000 mg twice daily, and insulin . His A1c was recently 7.4, slightly elevated from previous levels. He struggles with dietary habits, particularly a fondness for sweets, impacting his diabetes management. He takes gabapentin  for diabetic neuropathy and glipizide .  He has hypertension and takes aspirin daily over the counter, along with lisinopril, which was increased to 20 mg. His weight has been stable between 200 and 215 pounds. His pravastatin was increased to 20 mg in September.  He has a history of non-tension glaucoma and regularly sees an eye doctor. He uses albuterol as needed,  colchicine  twice daily, febuxostat for gout, Flonase, and indomethacin  for foot pain. Triamcinolone  is used for eczema.  He has sleep apnea and uses a CPAP machine, which has improved his sleep. He reports no recent issues with urination, though he experiences frequent urination upon standing due to an  enlarged prostate. He denies recent diarrhea and reports bowel movements once daily, using Imodium  only as needed.  He experienced a fall last week, hitting his head, but reports no swelling or symptoms following the incident. He has a history of a broken dental bridge and is awaiting VA assistance for dental care.  He exercises regularly, attending a gym twice a week for Pilates and weight training. He denies any history of smoking. He takes a multivitamin, glucosamine, omega-3, and vitamin D  daily. He is up to date with most vaccinations, having received the newer pneumonia vaccine in September 2025, and plans to get his flu and COVID vaccines at a pharmacy.     Advanced Directives Full code, Patient does not have advanced directives wants to think about it   Depression Screening;    02/28/2024    2:10 PM 01/15/2024   11:06 AM 01/10/2023   10:02 AM 01/10/2023    9:37 AM 09/21/2021   10:20 AM 09/08/2020   12:09 PM 09/08/2020   11:24 AM  PHQ 2/9 Scores  PHQ - 2 Score 2 1 1  0 0 0 0  PHQ- 9 Score 4           Anxiety Screening:     No data to display           ROS: Negative unless specifically indicated above in HPI.       Objective:     BP 104/62 (BP Location: Left Arm, Patient Position: Sitting, Cuff Size: Large)   Pulse 70   Temp 98 F (36.7 C) (Oral)   Ht 5' 11 (1.803 m)   Wt 218 lb (98.9 kg)   SpO2 97%   BMI 30.40 kg/m    Physical Exam GENERAL: Alert, cooperative, well developed, no acute distress. HEAD: Normocephalic atraumatic. EYES: Extraocular movements intact BL, pupils round, equal and reactive to light BL, conjunctivae normal BL. EARS: Tympanic membrane, ear canal and external ear normal BL. NOSE: No congestion or rhinorrhea, mucous membranes are moist. THROAT: No oropharyngeal exudate or posterior oropharyngeal erythema. CARDIOVASCULAR: Normal heart rate and rhythm, S1 and S2 normal without murmurs. CHEST: Clear to auscultation bilaterally, no  wheezes, rhonchi, or crackles. ABDOMEN: Soft, non tender, non distended, without organomegaly, normal bowel sounds. EXTREMITIES: No cyanosis or edema. NEUROLOGICAL: Oriented to person, place and time, ambulates with walker, moves all extremities without gross motor or sensory deficit.       Latesa Fratto K Laquitha Heslin, MD  02/29/24    Contains text generated by [employer].

## 2024-02-28 NOTE — Patient Instructions (Addendum)
 Thank you for visiting [facility] today! Here's what we talked about: - Get flu and covid from pharmacy  - Continue Aspirin

## 2024-02-29 DIAGNOSIS — Z23 Encounter for immunization: Secondary | ICD-10-CM | POA: Diagnosis not present

## 2024-03-26 ENCOUNTER — Other Ambulatory Visit: Payer: Self-pay | Admitting: Family

## 2024-04-21 ENCOUNTER — Ambulatory Visit (INDEPENDENT_AMBULATORY_CARE_PROVIDER_SITE_OTHER): Admitting: Internal Medicine

## 2024-04-21 ENCOUNTER — Encounter: Payer: Self-pay | Admitting: Internal Medicine

## 2024-04-21 ENCOUNTER — Other Ambulatory Visit

## 2024-04-21 VITALS — BP 120/60 | HR 76 | Ht 71.0 in | Wt 220.0 lb

## 2024-04-21 DIAGNOSIS — E663 Overweight: Secondary | ICD-10-CM

## 2024-04-21 DIAGNOSIS — E1165 Type 2 diabetes mellitus with hyperglycemia: Secondary | ICD-10-CM | POA: Diagnosis not present

## 2024-04-21 DIAGNOSIS — E785 Hyperlipidemia, unspecified: Secondary | ICD-10-CM | POA: Diagnosis not present

## 2024-04-21 DIAGNOSIS — Z794 Long term (current) use of insulin: Secondary | ICD-10-CM | POA: Diagnosis not present

## 2024-04-21 LAB — POCT GLYCOSYLATED HEMOGLOBIN (HGB A1C): Hemoglobin A1C: 7.3 % — AB (ref 4.0–5.6)

## 2024-04-21 LAB — MICROALBUMIN / CREATININE URINE RATIO
Creatinine, Urine: 64 mg/dL (ref 20–320)
Microalb Creat Ratio: 350 mg/g{creat} — ABNORMAL HIGH
Microalb, Ur: 22.4 mg/dL

## 2024-04-21 MED ORDER — GLIPIZIDE ER 2.5 MG PO TB24: 2.5000 mg | ORAL_TABLET | Freq: Every day | ORAL | 3 refills | Status: AC

## 2024-04-21 MED ORDER — GLIPIZIDE 5 MG PO TABS: 5.0000 mg | ORAL_TABLET | Freq: Every day | ORAL | 3 refills | Status: AC

## 2024-04-21 NOTE — Progress Notes (Signed)
 Patient ID: Lucas [name], male   DOB: 1940-07-22, 84 y.o.   MRN: [id number]  HPI: Lucas [name] is a 84 y.o.-year-old male, presenting for f/u for DM2, dx in 1998, insulin -dependent (previous Agent Orange exposure in [place]), uncontrolled, with complications (mild CKD, foot ulcer). Last visit 4 months ago.  Interim hx: No blurry vision, nausea, chest pain.  He does have increased urination.  He mentions he drinks plenty of water. He fell on 04/02/2024 in a parking lot >> hit head.  He was traveling from [place] , CT of the head and cervical spine did not show any bleeding.  Glucose was 51 - at the hospital, after the fall. He mentions he did not eat that morning.  However, no other lows since then.  Reviewed HbA1c levels: 12/19/2023 ([facility]): HbA1c 7.5% Lab Results  Component Value Date   HGBA1C 7.4 (A) 12/18/2023   HGBA1C 7.3 (A) 08/14/2023   HGBA1C 7.2 (A) 04/16/2023   HGBA1C 7.2 (A) 08/25/2022   HGBA1C 7.4 (A) 04/26/2022   HGBA1C 7.3 (A) 12/21/2021   HGBA1C 6.9 (A) 08/17/2021   HGBA1C 7.1 (A) 04/19/2021   HGBA1C 6.9 (A) 12/14/2020   HGBA1C 6.7 (A) 07/05/2020   HGBA1C 7.8 (A) 03/01/2020   HGBA1C 7.8 (A) 10/27/2019   HGBA1C 8.2 (A) 07/21/2019   HGBA1C 7.6 (A) 03/17/2019   HGBA1C 8.1 (H) 09/24/2018   HGBA1C 7.9 (A) 03/18/2018   HGBA1C 7.6 (A) 12/07/2017   HGBA1C 7.8 08/06/2017   HGBA1C 7.4 05/10/2017   HGBA1C 8.3 11/29/2016   HGBA1C 7.7 05/08/2016   HGBA1C 7.7 12/17/2015   HGBA1C 8.8 (H) 07/28/2015   HGBA1C 7.2 02/05/2015   HGBA1C 6.7 11/05/2014  12/15/2022: HbA1c 8.2%  Pt is on a regimen of: - Metformin 2000 mg with dinner >> at bedtime - Glipizide  XL 10 >> 5 mg before breakfast and 5 mg before dinner - Trulicity  1.5 >> 3 mg weekly >> tried Ozempic  >> constipation >> off for 3 mo >> restarted Trulicity  1.5 mg weekly - Tresiba  12 - started 02/2020 >> ...20 >>... he decreased by himself to 8 units daily >> 10 >> 12 >>  10 units daily He had to stop Farxiga  as K increased  (took 1 mo, off for 1.5 mo). Invokana  was not covered.  Pt checks his sugars 1-2 times a day - am:  118-165, 182 >> 87-159, 174, 199 >> 87, 116-151, 187 - 2h after b'fast:  233 >> n/c >> 292 >> n/c - before lunch:  107, 140 >> 126 >> 113-154 >> 92 >> n/c - 2h after lunch: 114-137 >> n/c >> 119 >> 156 >> n/c - before dinner:  165 >> n/c >> 193, 205 >> n/c >> 106-142 - 2h after dinner: 148, 218>> 132, 166-239, 256 >> n/c >> 126 - bedtime:   137 >> n/c >> 161 >> 180 >> n/c >> 106-232 - nighttime: n/c >> 165 >> n/c >> 117 >> n/c Lowest sugar was 65 >> ... 88 >> 87; he has hypoglycemia awareness in the 70s. Highest sugar was 292 >>... 199 (icecream) >> 232 (starch)  Glucometer: Precision >> One Touch Ultra mini >> [place] >> BioTel  Pt's meals are: - Breakfast:  Oatmeal, wheat English muffin  - Lunch: salad, 1/2 sandwich - Dinner: meat + veggies, added carbs back - Snacks: 2x a day, fruit bar, fresh fruit - snacking after dinner  + CKD: 12/19/2023: 26/1.4, GFR 50.2, glucose 91, ACR 184 Lab Results  Component Value Date  BUN 33 (H) 12/24/2022   CREATININE 1.29 (H) 12/24/2022   12/15/2022: ACR 70.96 (contaminated specimen)-he reportedly had another ACR afterwards which was normal. No results found for: MICRALBCREAT 01/04/2022: 19/1.26, GFR 58, glucose 138, ACR 13.59 04/16/2017: 15/1.2 07/13/2016: BUN/Cr 13/1.0, Glu 116, ACR 0.5 On lisinopril 10 mg daily.  + HL; last set of lipids: 12/19/2023 ([facility]): 150/359/28.1/57.4 12/15/2022 ([facility]): 182/287/37/98 01/04/2022 ([facility]): 173/390/32/86 Lab Results  Component Value Date   CHOL 166 09/21/2021   HDL 36.70 (L) 09/21/2021   LDLCALC 104 07/13/2016   LDLDIRECT 80.0 09/21/2021   TRIG 333.0 (H) 09/21/2021   CHOLHDL 5 09/21/2021  04/16/2016: 179/288/44/77 04/15/2015: LDL 89 On pravastatin 20. On fish oil 1000 mg daily >> increased to 2x a day 12/2020.  - last dilated eye exam was in 2025 ([facility]): No DR reportedly, + glaucoma. He lost vision left eye  - defect of optic nerve (he feels that this may be related to a stroke).  - + numbness and tingling in his feet.  He is on alpha-lipoic acid by neurology.  He also sees podiatry.  Latest foot exam was done by Dr. Verta on 12/03/2023.  He was in a DM clinical trial before: Jump Start  lifestyle Research Trial. He was on the [employer] and lifestyle program in the past.  He also has a history of HTN, ED. He has gout - prev. on Alopurinol >> then  on Uloric(he had a rash with allopurinol) He had a R foot ulcer 04/2017 and again in 11/2017. He is seen by a vascular dr.  Hosey were normal then. In 11/2017 he was getting Krysexxa injections for gout.  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 He had recurrent falls before last visit and was found to have orthostatic hypotension. His BP med doses were changed.  ROS: + see HPI  I reviewed pt's medications, allergies, PMH, social hx, family hx, and changes were documented in the history of present illness. Otherwise, unchanged from my initial visit note.  Past Medical History:  Diagnosis Date   Allergic rhinitis    Cataract    Diabetes mellitus type II    Diverticulosis of colon    Glaucoma    peripheral vision loss left eye.    History of agent Orange exposure    HLD (hyperlipidemia)    HTN (hypertension)    Hypertrophy of prostate without urinary obstruction and other lower urinary tract symptoms (LUTS)    Neuromuscular disorder (HCC)    Osteoarthritis    Sleep apnea    Past Surgical History:  Procedure Laterality Date   TOOTH EXTRACTION      History   Social History   Marital Status: Married    Spouse Name: N/A   Number of Children: 2   Occupational History   Retired-[employer]    Social History Main Topics   Smoking status: Never Smoker    Smokeless tobacco: Never Used   Alcohol Use: No   Drug Use: No    [place] vet- 20% disability from shrapnel wounds 1967 and 20% agent orange   Married  (Widowed 1996; remarried 6/03)   Retired [employer]      Has living will   Wife, then daughter Lucas [name] will be health care POA   Would want attempts at resuscitation   Would not want feeding tube if cognitively unaware   Current Outpatient Medications on File Prior to Visit  Medication Sig Dispense Refill   albuterol (VENTOLIN  HFA) 108 (90 Base) MCG/ACT inhaler Inhale 2 puffs into the lungs every 4 (four) hours as needed.     Alpha-Lipoic Acid 600 MG CAPS Take 1 capsule (600 mg total) by mouth in the morning and at bedtime. 60 capsule 3   aspirin 81 MG chewable tablet Chew 81 mg by mouth daily.     BD PEN NEEDLE NANO 2ND GEN 32G X 4 MM MISC USE ONCE DAILY AS DIRECTED 100 each 7   cetirizine (ZYRTEC) 10 MG tablet Take 10 mg by mouth daily.     colchicine  0.6 MG tablet TAKE 1 TABLET (0.6 MG TOTAL) 2 TIMES DAILY. WITH 2 AT THE ONSET OF PAIN. CONTINUE FOR 5 DAYS AFTER 180 tablet 0   COMBIGAN 0.2-0.5 % ophthalmic solution Apply 1 drop to eye 2 (two) times daily. (Patient taking differently: Apply 1 drop to eye daily in the afternoon.)     Dulaglutide  (TRULICITY ) 1.5 MG/0.5ML SOAJ INJECT 1.5 MG INTO THE SKIN ONCE A WEEK. 6 mL 5   febuxostat (ULORIC) 40 MG tablet TAKE 1 TABLET BY MOUTH EVERY DAY 90 tablet 3   fluticasone (FLONASE) 50 MCG/ACT nasal spray      gabapentin  (NEURONTIN ) 300 MG capsule Take 2 capsules (600 mg total) by mouth 2 (two) times daily. 360 capsule 3   glipiZIDE  (GLUCOTROL  XL) 5 MG 24 hr tablet TAKE 1 TABLET BY MOUTH IN THE MORNING AND 1 TAB AT DINNERTIME 180 tablet 2   glucose blood (ONE TOUCH ULTRA TEST) test strip Use as instructed to check sugar 2 times daily 200 each 5   indomethacin  (INDOCIN ) 50 MG capsule Take 1 capsule (50 mg total) by mouth 2 (two) times daily with a meal. 60 capsule 1   Insulin  Degludec FlexTouch 200 UNIT/ML SOPN Inject 10 Units into the skin daily. 9 mL 2   latanoprost (XALATAN) 0.005 % ophthalmic solution Place 1 drop into both eyes at bedtime.       lisinopril (PRINIVIL,ZESTRIL) 20 MG tablet Take 20 mg by mouth daily.     loperamide  (IMODIUM  A-D) 2 MG tablet Take 4mg  (2 tablets) in the morning, then 2mg  (1 tablet) after a loose bowel movement. No more than 16mg  in a day. Use daily for 7 days then as needed afterwards 30 tablet 0   metFORMIN (GLUCOPHAGE) 1000 MG tablet Take 1,000 mg by mouth 2 (two) times daily with a meal.     Misc Natural Products (GLUCOSAMINE CHONDROITIN ADV PO) Take 2 tablets by mouth 2 (two) times daily.     Multiple Vitamin (MULTIVITAMIN) tablet Take 1 tablet by mouth daily.     mupirocin  ointment (BACTROBAN ) 2 % Apply to wound twice a day. 30 g 2   Omega-3 Fatty Acids (FISH OIL) 1200 MG CAPS      pravastatin (PRAVACHOL) 20 MG tablet Take 10 mg by mouth daily.      sildenafil  (REVATIO ) 20 MG tablet TAKE 3 TO 5 TABLETS BY MOUTH ONCE DAILY AS NEEDED 50 tablet 11   Simethicone  80 MG TABS Take 1 tablet (80 mg total) by mouth 4 (four) times daily as needed (As needed for gas). 120 tablet 0   tamsulosin  (FLOMAX ) 0.4 MG CAPS capsule TAKE 1 CAPSULE BY MOUTH EVERY DAY 90 capsule 3   triamcinolone  ointment (KENALOG ) 0.5 % Apply 1 application topically 2 (two) times daily. 30 g 0   Vitamin D , Cholecalciferol, 1000 UNITS CAPS Take 1 capsule by mouth daily.     No current facility-administered medications on  file prior to visit.   No Known Allergies Family History  Problem Relation Age of Onset   Colon cancer Father    Stroke Mother    Heart attack Mother    Coronary artery disease Mother    Diabetes Mother    Hypertension Other        several siblings   Diabetes Other        several siblings   Coronary artery disease Brother    Asthma Brother    Diabetes Brother    ALS Sister    Diabetes Sister    Dementia Sister    Diabetes Brother    Early death Brother    Neuropathy Neg Hx    PE: BP 120/60   Pulse 76   Ht 5' 11 (1.803 m)   Wt 220 lb (99.8 kg)   SpO2 99%   BMI 30.68 kg/m   Wt Readings from Last 10  Encounters:  04/21/24 220 lb (99.8 kg)  02/28/24 218 lb (98.9 kg)  01/15/24 212 lb (96.2 kg)  01/15/24 213 lb (96.6 kg)  12/18/23 209 lb 12.8 oz (95.2 kg)  08/14/23 212 lb 6.4 oz (96.3 kg)  06/28/23 209 lb 6.4 oz (95 kg)  04/16/23 216 lb 3.2 oz (98.1 kg)  03/19/23 213 lb 3.2 oz (96.7 kg)  01/10/23 214 lb (97.1 kg)    Constitutional: overweight, in NAD Eyes:  EOMI, no exophthalmos ENT: no neck masses, no cervical lymphadenopathy Cardiovascular: RRR, No MRG Respiratory: CTA B Musculoskeletal: no deformities Skin:no rashes Neurological: no tremor with outstretched hands  ASSESSMENT: 1. DM2, insulin -dependent, uncontrolled, with complications - R foot ulcer. Nl ABIs - mild CKD  2. HL  3.  Overweight  PLAN:  1. Patient with longstanding, uncontrolled type 2 diabetes, on oral medication regimen with metformin, sulfonylurea, long-acting insulin  and weekly GLP-1 receptor agonist, will schedule suboptimal control.  At last visit, HbA1c was slightly higher at 7.4% and the next day he had another HbA1c performed at the [facility], which was 7.5%.  Sugars were at or slightly above target in the morning at last visit, but he was not checking later in the day.  He was frustrated about having to write the sugars down but I explained that it would be valuable to have information about whether some sugars were checked before and some after the meals.  HbA1c level was higher and we discussed about potentially increasing the Trulicity  dose but he felt that his blood sugars were higher due to his dietary indiscretions and he wanted to work on reducing the dose.  Therefore, we did not change the regimen. -He gets his metformin refilled from the [facility].  -At today's visit, sugars are more fluctuating in the morning, possibly also due to the holidays, with occasional higher values up to 180 or 190.  He mentions that these are related to dietary indiscretions at night.  He does mention that he does not always chew the  glipizide  XL tablet with dinner.  At today's visit, especially after his low blood sugar episode last month, at 51, I advised him to reduce the glipizide  XL dose in the morning.  I will also change his glipizide  to the extended release formulation to take before dinner but will continue the same dose for now, 5 mg.  Will continue the rest of the regimen for now. - I suggested to:  Patient Instructions  Please continue: - Metformin 2000 mg with dinner - Tresiba  10 units daily - Trulicity  1.5 mg  weekly  Please reduce: - Glipizide  XL 2.5 mg in a.m.   Change: - Glipizide  IR 5 mg before dinner  Please return in 3-4 months with your sugar log.   - we checked his HbA1c: 7.3% (slightly lower) - advised to check sugars at different times of the day - 1x a day, rotating check times - advised for yearly eye exams >> he is UTD - he has an increasing ACR.  This was in the 180s when last checked by PCP in 12/2023.  Will recheck this today.  If it continues to increase, patient plans to discuss with PCP about it and see if he needed a referral to nephrology. - return to clinic in 3-4 months  2. HL - Reviewed latest lipid panel from the [facility] 12/19/2023: 150/359/28.1/57.4 -triglycerides elevated, HDL low - He continues on 20 mg pravastatin daily and omega-3 fatty acids.  3.  Overweight -He was on Trulicity  3 mg weekly previously, but we had to switch to Ozempic  due to lack of availability.  However, he was not able to tolerate this so he went back to Trulicity  1.5 mg weekly - He lost 7 pounds before the last 2 visits combined but gained 11 pounds since then  Lela Fendt, MD PhD [facility] Endocrinology

## 2024-04-21 NOTE — Patient Instructions (Addendum)
 Please continue: - Metformin 2000 mg with dinner - Tresiba  10 units daily - Trulicity  1.5 mg weekly  Please reduce: - Glipizide  XL 2.5 mg in a.m.   Change: - Glipizide  IR 5 mg before dinner  Please return in 3-4 months with your sugar log.

## 2024-04-22 ENCOUNTER — Ambulatory Visit: Payer: Self-pay | Admitting: Internal Medicine

## 2024-05-08 ENCOUNTER — Telehealth: Payer: Self-pay

## 2024-05-08 ENCOUNTER — Ambulatory Visit: Payer: Self-pay

## 2024-05-08 ENCOUNTER — Ambulatory Visit

## 2024-05-08 ENCOUNTER — Telehealth: Payer: Self-pay | Admitting: *Deleted

## 2024-05-08 VITALS — BP 100/60 | HR 76 | Temp 97.5°F | Ht 71.0 in | Wt 215.0 lb

## 2024-05-08 DIAGNOSIS — R82998 Other abnormal findings in urine: Secondary | ICD-10-CM | POA: Diagnosis not present

## 2024-05-08 DIAGNOSIS — E7849 Other hyperlipidemia: Secondary | ICD-10-CM

## 2024-05-08 DIAGNOSIS — N1831 Chronic kidney disease, stage 3a: Secondary | ICD-10-CM | POA: Diagnosis not present

## 2024-05-08 DIAGNOSIS — R310 Gross hematuria: Secondary | ICD-10-CM | POA: Diagnosis not present

## 2024-05-08 DIAGNOSIS — I10 Essential (primary) hypertension: Secondary | ICD-10-CM

## 2024-05-08 DIAGNOSIS — E1142 Type 2 diabetes mellitus with diabetic polyneuropathy: Secondary | ICD-10-CM | POA: Diagnosis not present

## 2024-05-08 DIAGNOSIS — E875 Hyperkalemia: Secondary | ICD-10-CM

## 2024-05-08 LAB — BASIC METABOLIC PANEL WITH GFR
BUN: 34 mg/dL — ABNORMAL HIGH (ref 6–23)
CO2: 29 meq/L (ref 19–32)
Calcium: 9 mg/dL (ref 8.4–10.5)
Chloride: 105 meq/L (ref 96–112)
Creatinine, Ser: 1.6 mg/dL — ABNORMAL HIGH (ref 0.40–1.50)
GFR: 39.92 mL/min — ABNORMAL LOW
Glucose, Bld: 127 mg/dL — ABNORMAL HIGH (ref 70–99)
Potassium: 6.2 meq/L (ref 3.5–5.1)
Sodium: 137 meq/L (ref 135–145)

## 2024-05-08 LAB — CBC WITH DIFFERENTIAL/PLATELET
Basophils Absolute: 0 10*3/uL (ref 0.0–0.1)
Basophils Relative: 0.6 % (ref 0.0–3.0)
Eosinophils Absolute: 0.4 10*3/uL (ref 0.0–0.7)
Eosinophils Relative: 11.2 % — ABNORMAL HIGH (ref 0.0–5.0)
HCT: 39.3 % (ref 39.0–52.0)
Hemoglobin: 12.7 g/dL — ABNORMAL LOW (ref 13.0–17.0)
Lymphocytes Relative: 32.8 % (ref 12.0–46.0)
Lymphs Abs: 1.2 10*3/uL (ref 0.7–4.0)
MCHC: 32.4 g/dL (ref 30.0–36.0)
MCV: 87.1 fl (ref 78.0–100.0)
Monocytes Absolute: 0.2 10*3/uL (ref 0.1–1.0)
Monocytes Relative: 6.6 % (ref 3.0–12.0)
Neutro Abs: 1.8 10*3/uL (ref 1.4–7.7)
Neutrophils Relative %: 48.8 % (ref 43.0–77.0)
Platelets: 215 10*3/uL (ref 150.0–400.0)
RBC: 4.51 Mil/uL (ref 4.22–5.81)
RDW: 14 % (ref 11.5–15.5)
WBC: 3.6 10*3/uL — ABNORMAL LOW (ref 4.0–10.5)

## 2024-05-08 LAB — URINALYSIS, ROUTINE W REFLEX MICROSCOPIC
Bilirubin Urine: NEGATIVE
Nitrite: NEGATIVE
Specific Gravity, Urine: 1.02 (ref 1.000–1.030)
Total Protein, Urine: 100 — AB
Urine Glucose: NEGATIVE
Urobilinogen, UA: 0.2 (ref 0.0–1.0)
pH: 5.5 (ref 5.0–8.0)

## 2024-05-08 LAB — IBC + FERRITIN
Ferritin: 28.5 ng/mL (ref 22.0–322.0)
Iron: 65 ug/dL (ref 42–165)
Saturation Ratios: 16.5 % — ABNORMAL LOW (ref 20.0–50.0)
TIBC: 394.8 ug/dL (ref 250.0–450.0)
Transferrin: 282 mg/dL (ref 212.0–360.0)

## 2024-05-08 LAB — POCT URINE DIPSTICK
Bilirubin, UA: NEGATIVE
Glucose, UA: NEGATIVE mg/dL
Ketones, POC UA: NEGATIVE mg/dL
Nitrite, UA: NEGATIVE
POC PROTEIN,UA: 30 — AB
Spec Grav, UA: 1.015
Urobilinogen, UA: 0.2 U/dL
pH, UA: 6

## 2024-05-08 LAB — PHOSPHORUS: Phosphorus: 4.8 mg/dL — ABNORMAL HIGH (ref 2.3–4.6)

## 2024-05-08 LAB — VITAMIN D 25 HYDROXY (VIT D DEFICIENCY, FRACTURES): VITD: 45.64 ng/mL (ref 30.00–100.00)

## 2024-05-08 MED ORDER — FUROSEMIDE 20 MG PO TABS: 20.0000 mg | ORAL_TABLET | Freq: Every day | ORAL | 0 refills | Status: AC

## 2024-05-08 NOTE — Patient Instructions (Addendum)
 Thank you for visiting [facility] today! Here's what we talked about: - Continue all your meds for now - They will call you about ultrasound

## 2024-05-08 NOTE — Telephone Encounter (Signed)
 [place] lab called with Critical labs  Pt has a critical high Potassium of 6.2 and it was not hemolyzed  Results given to PCP

## 2024-05-08 NOTE — Telephone Encounter (Signed)
 Called pt and instructed as follows: Potassium level is high at 6.2 Hold Lisinopril for 5 days, Take Lasix  once daily for 5 days, restart Lisinopril after 5 days Avoid indomethacin  Drink 35oz of water daily Repeat labs in 1 week Go to ED if worsening muscle weakness, palpitations Pt verbalized understanding

## 2024-05-08 NOTE — Telephone Encounter (Signed)
 Call pt and schedule for labs in 1week to re-check potassium

## 2024-05-08 NOTE — Assessment & Plan Note (Signed)
 Well-controlled on current regimen, no changes. -Continue pravastatin 10 mg daily

## 2024-05-08 NOTE — Assessment & Plan Note (Signed)
 Recent A1c improved fm 7.5 to 7.3, following with endo, A1c is within age appropriate goal.  Concern about increasing ACR, previously 184 now 350. Unclear what his current GFR is at this time, most recent was 67 in Sept 2024. Ordering BMP as well as other renal labs below, US  to r/o structural causes for worsening CKD. ACR not quite doubled though increased, will await labs to see whether GFR has reduced by >20%, plan to also use labs for kidney failure risk calculator. Decision for nephro referral to be based on this. Pt already on Lisinopril for reno-protection, would benefit from either Farxiga  vs Kerendia given comorbid DM2.  Per chart review, he was on Farxiga  in the past, but was discontinued due to rising potassium, decision to add either of these to depend on labs.  If added, would de-escalate lisinopril dose to avoid orthostatic hypotension given BP runs soft. Per chart review, BPs are less than 130/80, no changes to antihypertensive regimen.  -Continue lisinopril 20 mg daily - Continue diabetic regimen as per endocrinology   Orders:   Iron, TIBC and Ferritin Panel   CBC with Differential/Platelet   Basic metabolic panel with GFR   VITAMIN D  25 Hydroxy (Vit-D Deficiency, Fractures)   PTH, intact and calcium   Phosphorus   US  RENAL; Future

## 2024-05-08 NOTE — Progress Notes (Signed)
 "  Subjective:   This visit was conducted in person. The patient gave informed consent to the use of Abridge AI technology to record the contents of the encounter as documented below.   Patient ID: Lucas [name], male    DOB: [birth year], 84 y.o.   MRN: [id number]   Discussed the use of AI scribe software for clinical note transcription with the patient, who gave verbal consent to proceed.  History of Present Illness Lucas [name] is an 84 year old male with diabetes and chronic kidney disease who presents with concerns about worsening kidney function.  He has noticed an increase in proteinuria. His urine appears 'Coke colored' in the mornings, which initially improved with increased hydration but has persisted despite adequate fluid intake. No dysuria or significant pedal edema, although slight swelling is noted depending on activity.  He experienced a fall resulting in facial and knee bruising but no head injuries. He perceives weakness on his left side, which he attributes to a possible small stroke in his eye as suggested by his eye doctor. No imbalance beyond neuropathy.  He is on lisinopril for kidney protection and previously used Farxiga , which was discontinued due to hyperkalemia. He has not restarted it and is unsure about current medication adjustments. He experienced hypotension last week, feeling dizzy postprandially, with both systolic and diastolic readings being low. He managed the episode by sitting and monitoring his blood pressure until it normalized.  His diabetes management has improved, with his A1c decreasing from 7.5 to 7.3, attributed to better dietary control, particularly avoiding bread late in the day, and adjustments in his medication regimen, including Trulicity  and metformin.      Review of Systems      Allergies[1]  Medications Ordered Prior to Encounter[2]  BP 100/60 (BP Location: Left Arm, Patient Position: Sitting, Cuff Size: Large)   Pulse 76    Temp (!) 97.5 F (36.4 C) (Oral)   Ht 5' 11 (1.803 m)   Wt 215 lb (97.5 kg)   SpO2 100%   BMI 29.99 kg/m   Objective:      Physical Exam GENERAL: Alert, cooperative, well developed, no acute distress. HEAD: Normocephalic atraumatic. EYES: Extraocular movements intact BL, pupils round, equal and reactive to light BL, conjunctivae normal BL. EXTREMITIES: No cyanosis or edema. NEUROLOGICAL: Oriented to person, place and time, no gait abnormalities, moves all extremities without gross motor or sensory deficit.         Assessment & Plan:   Assessment & Plan Stage 3a chronic kidney disease (HCC) Diabetic polyneuropathy associated with type 2 diabetes mellitus (HCC) Primary hypertension Recent A1c improved fm 7.5 to 7.3, following with endo, A1c is within age appropriate goal.  Concern about increasing ACR, previously 184 now 350. Unclear what his current GFR is at this time, most recent was 68 in Sept 2024. Ordering BMP as well as other renal labs below, US  to r/o structural causes for worsening CKD. ACR not quite doubled though increased, will await labs to see whether GFR has reduced by >20%, plan to also use labs for kidney failure risk calculator. Decision for nephro referral to be based on this. Pt already on Lisinopril for reno-protection, would benefit from either Farxiga  vs Kerendia given comorbid DM2.  Per chart review, he was on Farxiga  in the past, but was discontinued due to rising potassium, decision to add either of these to depend on labs.  If added, would de-escalate lisinopril dose to avoid orthostatic hypotension given BP runs  soft. Per chart review, BPs are less than 130/80, no changes to antihypertensive regimen.  -Continue lisinopril 20 mg daily - Continue diabetic regimen as per endocrinology   Orders:   Iron, TIBC and Ferritin Panel   CBC with Differential/Platelet   Basic metabolic panel with GFR   VITAMIN D  25 Hydroxy (Vit-D Deficiency, Fractures)   PTH,  intact and calcium   Phosphorus   US  RENAL; Future  Dark urine Reports of this since several weeks ago, sometimes resolving with better hydration, unclear whether this is true hematuria, ordering urine to test. No UTI sx.   Orders:   Urinalysis, Routine w reflex microscopic   POCT URINE DIPSTICK  Other hyperlipidemia Well-controlled on current regimen, no changes. -Continue pravastatin 10 mg daily     Addendum: UA did confirm hematuria, awaiting microscopy, however, given increasing ACR and pre-existing CKD, will refer to nephro. Will also change US  to CTAP to r/o Urologic malignancy.   Physical already scheduled for November 2026  Lucas Tat K Josiah Nieto, MD  05/08/24     Contains text generated by Abridge.        [1] No Known Allergies [2]  Current Outpatient Medications on File Prior to Visit  Medication Sig Dispense Refill   albuterol (VENTOLIN HFA) 108 (90 Base) MCG/ACT inhaler Inhale 2 puffs into the lungs every 4 (four) hours as needed.     Alpha-Lipoic Acid 600 MG CAPS Take 1 capsule (600 mg total) by mouth in the morning and at bedtime. 60 capsule 3   aspirin 81 MG chewable tablet Chew 81 mg by mouth daily.     BD PEN NEEDLE NANO 2ND GEN 32G X 4 MM MISC USE ONCE DAILY AS DIRECTED 100 each 7   cetirizine (ZYRTEC) 10 MG tablet Take 10 mg by mouth daily.     colchicine  0.6 MG tablet TAKE 1 TABLET (0.6 MG TOTAL) 2 TIMES DAILY. WITH 2 AT THE ONSET OF PAIN. CONTINUE FOR 5 DAYS AFTER 180 tablet 0   COMBIGAN 0.2-0.5 % ophthalmic solution Apply 1 drop to eye 2 (two) times daily. (Patient taking differently: Apply 1 drop to eye daily in the afternoon.)     Dulaglutide  (TRULICITY ) 1.5 MG/0.5ML SOAJ INJECT 1.5 MG INTO THE SKIN ONCE A WEEK. 6 mL 5   febuxostat (ULORIC) 40 MG tablet TAKE 1 TABLET BY MOUTH EVERY DAY 90 tablet 3   fluticasone (FLONASE) 50 MCG/ACT nasal spray      gabapentin  (NEURONTIN ) 300 MG capsule Take 2 capsules (600 mg total) by mouth 2 (two) times daily. 360 capsule 3    glipiZIDE  (GLUCOTROL  XL) 2.5 MG 24 hr tablet Take 1 tablet (2.5 mg total) by mouth daily with breakfast. 90 tablet 3   glucose blood (ONE TOUCH ULTRA TEST) test strip Use as instructed to check sugar 2 times daily 200 each 5   indomethacin  (INDOCIN ) 50 MG capsule Take 1 capsule (50 mg total) by mouth 2 (two) times daily with a meal. 60 capsule 1   Insulin  Degludec FlexTouch 200 UNIT/ML SOPN Inject 10 Units into the skin daily. 9 mL 2   latanoprost (XALATAN) 0.005 % ophthalmic solution Place 1 drop into both eyes at bedtime.      lisinopril (PRINIVIL,ZESTRIL) 20 MG tablet Take 20 mg by mouth daily.     loperamide  (IMODIUM  A-D) 2 MG tablet Take 4mg  (2 tablets) in the morning, then 2mg  (1 tablet) after a loose bowel movement. No more than 16mg  in a day. Use daily for 7 days  then as needed afterwards 30 tablet 0   metFORMIN (GLUCOPHAGE) 1000 MG tablet Take 1,000 mg by mouth 2 (two) times daily with a meal.     Misc Natural Products (GLUCOSAMINE CHONDROITIN ADV PO) Take 2 tablets by mouth 2 (two) times daily.     Multiple Vitamin (MULTIVITAMIN) tablet Take 1 tablet by mouth daily.     mupirocin  ointment (BACTROBAN ) 2 % Apply to wound twice a day. 30 g 2   Omega-3 Fatty Acids (FISH OIL) 1200 MG CAPS      pravastatin (PRAVACHOL) 20 MG tablet Take 10 mg by mouth daily.      sildenafil  (REVATIO ) 20 MG tablet TAKE 3 TO 5 TABLETS BY MOUTH ONCE DAILY AS NEEDED 50 tablet 11   Simethicone  80 MG TABS Take 1 tablet (80 mg total) by mouth 4 (four) times daily as needed (As needed for gas). 120 tablet 0   tamsulosin  (FLOMAX ) 0.4 MG CAPS capsule TAKE 1 CAPSULE BY MOUTH EVERY DAY 90 capsule 3   triamcinolone  ointment (KENALOG ) 0.5 % Apply 1 application topically 2 (two) times daily. 30 g 0   Vitamin D , Cholecalciferol, 1000 UNITS CAPS Take 1 capsule by mouth daily.     glipiZIDE  (GLUCOTROL ) 5 MG tablet Take 1 tablet (5 mg total) by mouth daily before supper. 90 tablet 3   No current facility-administered  medications on file prior to visit.   "

## 2024-05-09 LAB — OPHTHALMOLOGY REPORT-SCANNED

## 2024-05-11 ENCOUNTER — Ambulatory Visit: Payer: Self-pay

## 2024-05-11 DIAGNOSIS — E1142 Type 2 diabetes mellitus with diabetic polyneuropathy: Secondary | ICD-10-CM

## 2024-05-11 NOTE — Telephone Encounter (Signed)
 Error

## 2024-05-12 LAB — PTH, INTACT AND CALCIUM: PTH: 43 pg/mL (ref 16–77)

## 2024-05-15 ENCOUNTER — Ambulatory Visit

## 2024-05-15 ENCOUNTER — Other Ambulatory Visit

## 2024-05-15 DIAGNOSIS — E875 Hyperkalemia: Secondary | ICD-10-CM

## 2024-05-15 LAB — BASIC METABOLIC PANEL WITH GFR
BUN: 47 mg/dL — ABNORMAL HIGH (ref 6–23)
CO2: 26 meq/L (ref 19–32)
Calcium: 9 mg/dL (ref 8.4–10.5)
Chloride: 104 meq/L (ref 96–112)
Creatinine, Ser: 1.79 mg/dL — ABNORMAL HIGH (ref 0.40–1.50)
GFR: 34.89 mL/min — ABNORMAL LOW
Glucose, Bld: 157 mg/dL — ABNORMAL HIGH (ref 70–99)
Potassium: 5.9 meq/L — ABNORMAL HIGH (ref 3.5–5.1)
Sodium: 137 meq/L (ref 135–145)

## 2024-05-16 ENCOUNTER — Ambulatory Visit: Admission: RE | Admit: 2024-05-16

## 2024-05-16 DIAGNOSIS — R310 Gross hematuria: Secondary | ICD-10-CM

## 2024-05-16 MED ORDER — IOHEXOL 300 MG/ML  SOLN
100.0000 mL | Freq: Once | INTRAMUSCULAR | Status: AC | PRN
  Administered 2024-05-16: 100 mL via INTRAVENOUS

## 2024-06-02 ENCOUNTER — Ambulatory Visit: Admitting: Podiatry

## 2024-08-19 ENCOUNTER — Ambulatory Visit: Admitting: Internal Medicine

## 2025-01-15 ENCOUNTER — Ambulatory Visit

## 2025-01-16 ENCOUNTER — Ambulatory Visit

## 2025-03-02 ENCOUNTER — Ambulatory Visit
